# Patient Record
Sex: Male | Born: 1935
Health system: Southern US, Community
[De-identification: ages and names within clinical notes are randomized; demographics above are authoritative.]

## PROBLEM LIST (undated history)

## (undated) DIAGNOSIS — C801 Malignant (primary) neoplasm, unspecified: Secondary | ICD-10-CM

## (undated) DIAGNOSIS — IMO0002 Reserved for concepts with insufficient information to code with codable children: Secondary | ICD-10-CM

## (undated) DIAGNOSIS — R351 Nocturia: Secondary | ICD-10-CM

## (undated) DIAGNOSIS — B958 Unspecified staphylococcus as the cause of diseases classified elsewhere: Secondary | ICD-10-CM

## (undated) DIAGNOSIS — C679 Malignant neoplasm of bladder, unspecified: Secondary | ICD-10-CM

## (undated) DIAGNOSIS — N400 Enlarged prostate without lower urinary tract symptoms: Secondary | ICD-10-CM

## (undated) DIAGNOSIS — M545 Low back pain, unspecified: Secondary | ICD-10-CM

## (undated) DIAGNOSIS — D649 Anemia, unspecified: Secondary | ICD-10-CM

## (undated) DIAGNOSIS — I779 Disorder of arteries and arterioles, unspecified: Secondary | ICD-10-CM

## (undated) DIAGNOSIS — R9430 Abnormal result of cardiovascular function study, unspecified: Secondary | ICD-10-CM

## (undated) DIAGNOSIS — Z9889 Other specified postprocedural states: Secondary | ICD-10-CM

## (undated) DIAGNOSIS — Z85828 Personal history of other malignant neoplasm of skin: Secondary | ICD-10-CM

## (undated) DIAGNOSIS — H6980 Other specified disorders of Eustachian tube, unspecified ear: Secondary | ICD-10-CM

## (undated) DIAGNOSIS — I10 Essential (primary) hypertension: Secondary | ICD-10-CM

## (undated) DIAGNOSIS — E785 Hyperlipidemia, unspecified: Secondary | ICD-10-CM

## (undated) DIAGNOSIS — K219 Gastro-esophageal reflux disease without esophagitis: Secondary | ICD-10-CM

## (undated) DIAGNOSIS — J45909 Unspecified asthma, uncomplicated: Secondary | ICD-10-CM

## (undated) DIAGNOSIS — N529 Male erectile dysfunction, unspecified: Secondary | ICD-10-CM

## (undated) DIAGNOSIS — J449 Chronic obstructive pulmonary disease, unspecified: Secondary | ICD-10-CM

## (undated) DIAGNOSIS — I739 Peripheral vascular disease, unspecified: Secondary | ICD-10-CM

## (undated) DIAGNOSIS — C4490 Unspecified malignant neoplasm of skin, unspecified: Secondary | ICD-10-CM

## (undated) DIAGNOSIS — D696 Thrombocytopenia, unspecified: Secondary | ICD-10-CM

## (undated) DIAGNOSIS — Z79899 Other long term (current) drug therapy: Secondary | ICD-10-CM

## (undated) DIAGNOSIS — T7840XA Allergy, unspecified, initial encounter: Secondary | ICD-10-CM

## (undated) DIAGNOSIS — R911 Solitary pulmonary nodule: Secondary | ICD-10-CM

## (undated) DIAGNOSIS — M1990 Unspecified osteoarthritis, unspecified site: Secondary | ICD-10-CM

## (undated) DIAGNOSIS — E7800 Pure hypercholesterolemia, unspecified: Secondary | ICD-10-CM

## (undated) DIAGNOSIS — I219 Acute myocardial infarction, unspecified: Secondary | ICD-10-CM

## (undated) DIAGNOSIS — N62 Hypertrophy of breast: Secondary | ICD-10-CM

## (undated) DIAGNOSIS — I2510 Atherosclerotic heart disease of native coronary artery without angina pectoris: Secondary | ICD-10-CM

## (undated) DIAGNOSIS — I429 Cardiomyopathy, unspecified: Secondary | ICD-10-CM

## (undated) DIAGNOSIS — N183 Chronic kidney disease, stage 3 unspecified: Secondary | ICD-10-CM

## (undated) DIAGNOSIS — H6990 Unspecified Eustachian tube disorder, unspecified ear: Secondary | ICD-10-CM

## (undated) DIAGNOSIS — C649 Malignant neoplasm of unspecified kidney, except renal pelvis: Secondary | ICD-10-CM

## (undated) HISTORY — DX: Unspecified eustachian tube disorder, unspecified ear: H69.90

## (undated) HISTORY — PX: CARDIAC CATHETERIZATION: SHX172

## (undated) HISTORY — DX: Disorder of arteries and arterioles, unspecified: I77.9

## (undated) HISTORY — DX: Male erectile dysfunction, unspecified: N52.9

## (undated) HISTORY — DX: Nocturia: R35.1

## (undated) HISTORY — DX: Abnormal result of cardiovascular function study, unspecified: R94.30

## (undated) HISTORY — DX: Malignant (primary) neoplasm, unspecified: C80.1

## (undated) HISTORY — DX: Hyperlipidemia, unspecified: E78.5

## (undated) HISTORY — DX: Unspecified osteoarthritis, unspecified site: M19.90

## (undated) HISTORY — DX: Chronic obstructive pulmonary disease, unspecified: J44.9

## (undated) HISTORY — DX: Anemia, unspecified: D64.9

## (undated) HISTORY — DX: Unspecified asthma, uncomplicated: J45.909

## (undated) HISTORY — DX: Low back pain, unspecified: M54.50

## (undated) HISTORY — DX: Benign prostatic hyperplasia without lower urinary tract symptoms: N40.0

## (undated) HISTORY — DX: Other specified postprocedural states: Z98.890

## (undated) HISTORY — DX: Solitary pulmonary nodule: R91.1

## (undated) HISTORY — DX: Malignant neoplasm of bladder, unspecified: C67.9

## (undated) HISTORY — PX: OTHER SURGICAL HISTORY: SHX169

## (undated) HISTORY — DX: Reserved for concepts with insufficient information to code with codable children: IMO0002

## (undated) HISTORY — DX: Essential (primary) hypertension: I10

## (undated) HISTORY — DX: Thrombocytopenia, unspecified: D69.6

## (undated) HISTORY — DX: Atherosclerotic heart disease of native coronary artery without angina pectoris: I25.10

## (undated) HISTORY — DX: Unspecified malignant neoplasm of skin, unspecified: C44.90

## (undated) HISTORY — DX: Acute myocardial infarction, unspecified: I21.9

## (undated) HISTORY — DX: Hypertrophy of breast: N62

## (undated) HISTORY — DX: Other specified disorders of Eustachian tube, unspecified ear: H69.80

## (undated) HISTORY — DX: Allergy, unspecified, initial encounter: T78.40XA

## (undated) HISTORY — DX: Peripheral vascular disease, unspecified: I73.9

## (undated) HISTORY — DX: Malignant neoplasm of unspecified kidney, except renal pelvis: C64.9

## (undated) HISTORY — DX: Other long term (current) drug therapy: Z79.899

## (undated) HISTORY — DX: Gastro-esophageal reflux disease without esophagitis: K21.9

## (undated) HISTORY — DX: Pure hypercholesterolemia, unspecified: E78.00

## (undated) HISTORY — DX: Low back pain: M54.5

## (undated) HISTORY — DX: Personal history of other malignant neoplasm of skin: Z85.828

---

## 1996-02-24 HISTORY — PX: BACK SURGERY: SHX140

## 1997-04-25 DIAGNOSIS — B958 Unspecified staphylococcus as the cause of diseases classified elsewhere: Secondary | ICD-10-CM

## 1997-04-25 HISTORY — DX: Unspecified staphylococcus as the cause of diseases classified elsewhere: B95.8

## 2001-02-01 ENCOUNTER — Ambulatory Visit (HOSPITAL_COMMUNITY): Admission: RE | Admit: 2001-02-01 | Discharge: 2001-02-01 | Payer: Self-pay | Admitting: Internal Medicine

## 2001-02-01 ENCOUNTER — Encounter: Payer: Self-pay | Admitting: Internal Medicine

## 2001-02-21 ENCOUNTER — Encounter: Payer: Self-pay | Admitting: Urology

## 2001-02-21 ENCOUNTER — Encounter (INDEPENDENT_AMBULATORY_CARE_PROVIDER_SITE_OTHER): Payer: Self-pay | Admitting: Specialist

## 2001-02-21 ENCOUNTER — Ambulatory Visit (HOSPITAL_COMMUNITY): Admission: RE | Admit: 2001-02-21 | Discharge: 2001-02-21 | Payer: Self-pay | Admitting: Urology

## 2001-02-23 HISTORY — PX: NEPHRECTOMY: SHX65

## 2001-02-28 ENCOUNTER — Inpatient Hospital Stay (HOSPITAL_COMMUNITY): Admission: RE | Admit: 2001-02-28 | Discharge: 2001-03-06 | Payer: Self-pay | Admitting: Urology

## 2001-02-28 ENCOUNTER — Encounter (INDEPENDENT_AMBULATORY_CARE_PROVIDER_SITE_OTHER): Payer: Self-pay | Admitting: Specialist

## 2001-02-28 DIAGNOSIS — Z859 Personal history of malignant neoplasm, unspecified: Secondary | ICD-10-CM | POA: Insufficient documentation

## 2001-02-28 DIAGNOSIS — C801 Malignant (primary) neoplasm, unspecified: Secondary | ICD-10-CM

## 2001-02-28 HISTORY — DX: Malignant (primary) neoplasm, unspecified: C80.1

## 2001-09-03 ENCOUNTER — Encounter: Admission: RE | Admit: 2001-09-03 | Discharge: 2001-09-03 | Payer: Self-pay | Admitting: Urology

## 2001-09-03 ENCOUNTER — Encounter: Payer: Self-pay | Admitting: Urology

## 2001-12-26 HISTORY — PX: URETERECTOMY: SUR1402

## 2002-02-26 ENCOUNTER — Encounter: Admission: RE | Admit: 2002-02-26 | Discharge: 2002-02-26 | Payer: Self-pay | Admitting: Urology

## 2002-02-26 ENCOUNTER — Encounter: Payer: Self-pay | Admitting: Urology

## 2002-04-25 HISTORY — PX: CORNEAL TRANSPLANT: SHX108

## 2002-09-06 ENCOUNTER — Encounter: Payer: Self-pay | Admitting: Urology

## 2002-09-06 ENCOUNTER — Encounter: Admission: RE | Admit: 2002-09-06 | Discharge: 2002-09-06 | Payer: Self-pay | Admitting: Urology

## 2002-10-02 ENCOUNTER — Ambulatory Visit (HOSPITAL_BASED_OUTPATIENT_CLINIC_OR_DEPARTMENT_OTHER): Admission: RE | Admit: 2002-10-02 | Discharge: 2002-10-02 | Payer: Self-pay | Admitting: Urology

## 2002-10-02 ENCOUNTER — Encounter (INDEPENDENT_AMBULATORY_CARE_PROVIDER_SITE_OTHER): Payer: Self-pay | Admitting: Specialist

## 2003-01-30 ENCOUNTER — Emergency Department (HOSPITAL_COMMUNITY): Admission: EM | Admit: 2003-01-30 | Discharge: 2003-01-30 | Payer: Self-pay

## 2003-04-30 ENCOUNTER — Encounter (INDEPENDENT_AMBULATORY_CARE_PROVIDER_SITE_OTHER): Payer: Self-pay | Admitting: Specialist

## 2003-04-30 ENCOUNTER — Ambulatory Visit (HOSPITAL_BASED_OUTPATIENT_CLINIC_OR_DEPARTMENT_OTHER): Admission: RE | Admit: 2003-04-30 | Discharge: 2003-04-30 | Payer: Self-pay | Admitting: Urology

## 2003-07-27 HISTORY — PX: CHOLECYSTECTOMY: SHX55

## 2003-08-18 ENCOUNTER — Emergency Department (HOSPITAL_COMMUNITY): Admission: EM | Admit: 2003-08-18 | Discharge: 2003-08-18 | Payer: Self-pay | Admitting: Emergency Medicine

## 2003-08-18 ENCOUNTER — Encounter: Payer: Self-pay | Admitting: Emergency Medicine

## 2003-08-20 ENCOUNTER — Encounter: Payer: Self-pay | Admitting: Surgery

## 2003-08-20 ENCOUNTER — Encounter (INDEPENDENT_AMBULATORY_CARE_PROVIDER_SITE_OTHER): Payer: Self-pay | Admitting: Specialist

## 2003-08-21 ENCOUNTER — Encounter: Payer: Self-pay | Admitting: Surgery

## 2003-08-21 ENCOUNTER — Inpatient Hospital Stay (HOSPITAL_COMMUNITY): Admission: RE | Admit: 2003-08-21 | Discharge: 2003-08-23 | Payer: Self-pay | Admitting: Surgery

## 2003-09-08 ENCOUNTER — Encounter: Admission: RE | Admit: 2003-09-08 | Discharge: 2003-09-08 | Payer: Self-pay | Admitting: Infectious Diseases

## 2003-09-30 ENCOUNTER — Encounter: Admission: RE | Admit: 2003-09-30 | Discharge: 2003-09-30 | Payer: Self-pay | Admitting: Infectious Diseases

## 2003-10-13 ENCOUNTER — Encounter: Admission: RE | Admit: 2003-10-13 | Discharge: 2003-10-13 | Payer: Self-pay | Admitting: Infectious Diseases

## 2003-12-27 DIAGNOSIS — I2510 Atherosclerotic heart disease of native coronary artery without angina pectoris: Secondary | ICD-10-CM

## 2003-12-27 HISTORY — DX: Atherosclerotic heart disease of native coronary artery without angina pectoris: I25.10

## 2004-01-02 ENCOUNTER — Inpatient Hospital Stay (HOSPITAL_COMMUNITY): Admission: EM | Admit: 2004-01-02 | Discharge: 2004-01-06 | Payer: Self-pay

## 2004-01-19 ENCOUNTER — Encounter (HOSPITAL_COMMUNITY): Admission: RE | Admit: 2004-01-19 | Discharge: 2004-04-18 | Payer: Self-pay | Admitting: Internal Medicine

## 2004-12-02 ENCOUNTER — Ambulatory Visit: Payer: Self-pay | Admitting: Cardiology

## 2004-12-03 ENCOUNTER — Ambulatory Visit: Payer: Self-pay | Admitting: Internal Medicine

## 2005-04-29 ENCOUNTER — Ambulatory Visit: Payer: Self-pay | Admitting: Cardiology

## 2005-11-10 ENCOUNTER — Ambulatory Visit: Payer: Self-pay | Admitting: Internal Medicine

## 2006-03-16 ENCOUNTER — Ambulatory Visit: Payer: Self-pay | Admitting: Cardiology

## 2006-05-04 ENCOUNTER — Ambulatory Visit: Payer: Self-pay | Admitting: Cardiology

## 2006-05-15 ENCOUNTER — Ambulatory Visit: Payer: Self-pay

## 2006-05-18 ENCOUNTER — Ambulatory Visit: Payer: Self-pay | Admitting: Cardiology

## 2006-07-26 ENCOUNTER — Ambulatory Visit (HOSPITAL_BASED_OUTPATIENT_CLINIC_OR_DEPARTMENT_OTHER): Admission: RE | Admit: 2006-07-26 | Discharge: 2006-07-26 | Payer: Self-pay | Admitting: Urology

## 2006-07-26 ENCOUNTER — Encounter (INDEPENDENT_AMBULATORY_CARE_PROVIDER_SITE_OTHER): Payer: Self-pay | Admitting: Specialist

## 2006-08-22 ENCOUNTER — Ambulatory Visit: Payer: Self-pay | Admitting: Cardiology

## 2006-11-22 ENCOUNTER — Ambulatory Visit: Payer: Self-pay | Admitting: Internal Medicine

## 2007-01-14 ENCOUNTER — Ambulatory Visit: Payer: Self-pay | Admitting: Cardiology

## 2007-01-14 ENCOUNTER — Inpatient Hospital Stay (HOSPITAL_COMMUNITY): Admission: EM | Admit: 2007-01-14 | Discharge: 2007-01-16 | Payer: Self-pay | Admitting: Emergency Medicine

## 2007-01-22 ENCOUNTER — Ambulatory Visit: Payer: Self-pay

## 2007-01-25 DIAGNOSIS — E785 Hyperlipidemia, unspecified: Secondary | ICD-10-CM

## 2007-01-25 DIAGNOSIS — E1169 Type 2 diabetes mellitus with other specified complication: Secondary | ICD-10-CM | POA: Insufficient documentation

## 2007-02-05 ENCOUNTER — Ambulatory Visit: Payer: Self-pay | Admitting: Cardiology

## 2007-02-05 LAB — CONVERTED CEMR LAB
BUN: 17 mg/dL (ref 6–23)
Basophils Absolute: 0 10*3/uL (ref 0.0–0.1)
Creatinine, Ser: 1.5 mg/dL (ref 0.4–1.5)
Eosinophils Absolute: 0.2 10*3/uL (ref 0.0–0.6)
GFR calc Af Amer: 58 mL/min
GFR calc non Af Amer: 48 mL/min
HCT: 38.8 % — ABNORMAL LOW (ref 39.0–52.0)
Hemoglobin: 13.6 g/dL (ref 13.0–17.0)
MCHC: 35.1 g/dL (ref 30.0–36.0)
MCV: 92.1 fL (ref 78.0–100.0)
Monocytes Absolute: 0.5 10*3/uL (ref 0.2–0.7)
Monocytes Relative: 11.5 % — ABNORMAL HIGH (ref 3.0–11.0)
Neutrophils Relative %: 53 % (ref 43.0–77.0)
Potassium: 4.3 meq/L (ref 3.5–5.1)
RDW: 12.5 % (ref 11.5–14.6)
Sodium: 138 meq/L (ref 135–145)

## 2007-08-07 ENCOUNTER — Ambulatory Visit: Payer: Self-pay | Admitting: Cardiology

## 2007-08-10 ENCOUNTER — Ambulatory Visit: Payer: Self-pay | Admitting: Cardiology

## 2007-08-10 LAB — CONVERTED CEMR LAB
ALT: 35 units/L (ref 0–53)
AST: 31 units/L (ref 0–37)
Bilirubin, Direct: 0.1 mg/dL (ref 0.0–0.3)
Cholesterol: 152 mg/dL (ref 0–200)
Direct LDL: 51.9 mg/dL
HDL: 31.1 mg/dL — ABNORMAL LOW (ref >39.0)
Total Bilirubin: 1.8 mg/dL — ABNORMAL HIGH (ref 0.3–1.2)
Total Protein: 6.7 g/dL (ref 6.0–8.3)

## 2007-10-23 ENCOUNTER — Ambulatory Visit: Payer: Self-pay | Admitting: Cardiology

## 2007-10-23 ENCOUNTER — Ambulatory Visit: Payer: Self-pay | Admitting: Internal Medicine

## 2007-10-23 LAB — CONVERTED CEMR LAB
ALT: 39 units/L (ref 0–53)
AST: 28 units/L (ref 0–37)
Albumin: 4.1 g/dL (ref 3.5–5.2)
Alkaline Phosphatase: 69 units/L (ref 39–117)
Total Bilirubin: 1.6 mg/dL — ABNORMAL HIGH (ref 0.3–1.2)
Total Protein: 6.5 g/dL (ref 6.0–8.3)
Triglycerides: 404 mg/dL (ref 0–149)

## 2008-01-04 ENCOUNTER — Ambulatory Visit: Payer: Self-pay | Admitting: Cardiovascular Disease

## 2008-01-04 ENCOUNTER — Inpatient Hospital Stay (HOSPITAL_COMMUNITY): Admission: EM | Admit: 2008-01-04 | Discharge: 2008-01-08 | Payer: Self-pay | Admitting: Emergency Medicine

## 2008-01-25 ENCOUNTER — Ambulatory Visit: Payer: Self-pay | Admitting: Cardiology

## 2008-02-04 ENCOUNTER — Ambulatory Visit: Payer: Self-pay | Admitting: Internal Medicine

## 2008-03-17 ENCOUNTER — Telehealth (INDEPENDENT_AMBULATORY_CARE_PROVIDER_SITE_OTHER): Payer: Self-pay | Admitting: *Deleted

## 2008-03-18 ENCOUNTER — Emergency Department (HOSPITAL_COMMUNITY): Admission: EM | Admit: 2008-03-18 | Discharge: 2008-03-18 | Payer: Self-pay | Admitting: Emergency Medicine

## 2008-03-24 ENCOUNTER — Ambulatory Visit: Payer: Self-pay | Admitting: Cardiology

## 2008-03-28 ENCOUNTER — Ambulatory Visit: Payer: Self-pay | Admitting: Internal Medicine

## 2008-03-28 DIAGNOSIS — J45909 Unspecified asthma, uncomplicated: Secondary | ICD-10-CM | POA: Insufficient documentation

## 2008-04-17 ENCOUNTER — Telehealth: Payer: Self-pay | Admitting: Internal Medicine

## 2008-04-23 ENCOUNTER — Ambulatory Visit: Payer: Self-pay | Admitting: Internal Medicine

## 2008-04-23 DIAGNOSIS — H6980 Other specified disorders of Eustachian tube, unspecified ear: Secondary | ICD-10-CM

## 2008-04-23 DIAGNOSIS — H6990 Unspecified Eustachian tube disorder, unspecified ear: Secondary | ICD-10-CM | POA: Insufficient documentation

## 2008-04-23 DIAGNOSIS — I10 Essential (primary) hypertension: Secondary | ICD-10-CM

## 2008-04-23 DIAGNOSIS — E1159 Type 2 diabetes mellitus with other circulatory complications: Secondary | ICD-10-CM | POA: Insufficient documentation

## 2008-06-06 ENCOUNTER — Ambulatory Visit: Payer: Self-pay | Admitting: Cardiology

## 2008-06-06 LAB — CONVERTED CEMR LAB
Albumin: 4 g/dL (ref 3.5–5.2)
Alkaline Phosphatase: 65 units/L (ref 39–117)
Cholesterol: 144 mg/dL (ref 0–200)
Total Bilirubin: 1.5 mg/dL — ABNORMAL HIGH (ref 0.3–1.2)
Total CHOL/HDL Ratio: 5
VLDL: 81 mg/dL — ABNORMAL HIGH (ref 0–40)

## 2008-09-03 ENCOUNTER — Ambulatory Visit: Payer: Self-pay | Admitting: Cardiology

## 2008-09-17 ENCOUNTER — Ambulatory Visit: Payer: Self-pay | Admitting: Internal Medicine

## 2008-10-02 ENCOUNTER — Ambulatory Visit: Payer: Self-pay | Admitting: Family Medicine

## 2008-10-04 ENCOUNTER — Encounter: Payer: Self-pay | Admitting: Family Medicine

## 2008-10-26 HISTORY — PX: CORNEAL TRANSPLANT: SHX108

## 2008-10-27 ENCOUNTER — Encounter: Payer: Self-pay | Admitting: Internal Medicine

## 2008-12-02 ENCOUNTER — Encounter: Payer: Self-pay | Admitting: Internal Medicine

## 2009-01-16 ENCOUNTER — Ambulatory Visit: Payer: Self-pay | Admitting: Cardiology

## 2009-01-16 LAB — CONVERTED CEMR LAB
AST: 26 units/L (ref 0–37)
Albumin: 3.8 g/dL (ref 3.5–5.2)
Alkaline Phosphatase: 62 units/L (ref 39–117)
Bilirubin, Direct: 0.1 mg/dL (ref 0.0–0.3)
Total CHOL/HDL Ratio: 4.5
Triglycerides: 291 mg/dL (ref 0–149)

## 2009-02-18 ENCOUNTER — Encounter (INDEPENDENT_AMBULATORY_CARE_PROVIDER_SITE_OTHER): Payer: Self-pay | Admitting: Urology

## 2009-02-18 ENCOUNTER — Ambulatory Visit (HOSPITAL_BASED_OUTPATIENT_CLINIC_OR_DEPARTMENT_OTHER): Admission: RE | Admit: 2009-02-18 | Discharge: 2009-02-18 | Payer: Self-pay | Admitting: Urology

## 2009-06-22 ENCOUNTER — Encounter: Payer: Self-pay | Admitting: Cardiology

## 2009-07-03 ENCOUNTER — Ambulatory Visit: Payer: Self-pay | Admitting: Cardiology

## 2009-07-07 ENCOUNTER — Ambulatory Visit: Payer: Self-pay | Admitting: Internal Medicine

## 2009-07-07 DIAGNOSIS — M545 Low back pain: Secondary | ICD-10-CM | POA: Insufficient documentation

## 2009-07-07 LAB — CONVERTED CEMR LAB
Bilirubin Urine: NEGATIVE
Blood in Urine, dipstick: NEGATIVE
Glucose, Urine, Semiquant: NEGATIVE
Ketones, urine, test strip: NEGATIVE
Protein, U semiquant: NEGATIVE
Specific Gravity, Urine: <1.005
pH: 7

## 2009-07-18 DIAGNOSIS — K219 Gastro-esophageal reflux disease without esophagitis: Secondary | ICD-10-CM | POA: Insufficient documentation

## 2009-07-18 DIAGNOSIS — J4489 Other specified chronic obstructive pulmonary disease: Secondary | ICD-10-CM | POA: Insufficient documentation

## 2009-07-18 DIAGNOSIS — C649 Malignant neoplasm of unspecified kidney, except renal pelvis: Secondary | ICD-10-CM

## 2009-07-18 DIAGNOSIS — J449 Chronic obstructive pulmonary disease, unspecified: Secondary | ICD-10-CM

## 2009-07-18 DIAGNOSIS — D649 Anemia, unspecified: Secondary | ICD-10-CM | POA: Insufficient documentation

## 2009-07-18 DIAGNOSIS — Z85528 Personal history of other malignant neoplasm of kidney: Secondary | ICD-10-CM | POA: Insufficient documentation

## 2009-07-21 LAB — CONVERTED CEMR LAB
ALT: 38 units/L (ref 0–53)
Alkaline Phosphatase: 60 units/L (ref 39–117)
Bilirubin, Direct: 0.1 mg/dL (ref 0.0–0.3)
Direct LDL: 65.6 mg/dL
HDL: 38.2 mg/dL — ABNORMAL LOW (ref >39.00)
Total Bilirubin: 1.8 mg/dL — ABNORMAL HIGH (ref 0.3–1.2)
Total CHOL/HDL Ratio: 6
VLDL: 133.2 mg/dL — ABNORMAL HIGH (ref 0.0–40.0)

## 2009-09-08 ENCOUNTER — Encounter: Payer: Self-pay | Admitting: Cardiology

## 2009-09-09 ENCOUNTER — Ambulatory Visit: Payer: Self-pay | Admitting: Cardiology

## 2009-09-22 ENCOUNTER — Encounter: Payer: Self-pay | Admitting: Cardiology

## 2009-09-22 ENCOUNTER — Ambulatory Visit: Payer: Self-pay

## 2009-10-09 ENCOUNTER — Emergency Department (HOSPITAL_COMMUNITY): Admission: EM | Admit: 2009-10-09 | Discharge: 2009-10-09 | Payer: Self-pay | Admitting: Emergency Medicine

## 2009-11-04 ENCOUNTER — Emergency Department (HOSPITAL_COMMUNITY): Admission: EM | Admit: 2009-11-04 | Discharge: 2009-11-04 | Payer: Self-pay | Admitting: Emergency Medicine

## 2009-12-07 ENCOUNTER — Ambulatory Visit: Payer: Self-pay | Admitting: Internal Medicine

## 2009-12-07 DIAGNOSIS — M5412 Radiculopathy, cervical region: Secondary | ICD-10-CM | POA: Insufficient documentation

## 2009-12-07 DIAGNOSIS — Z85828 Personal history of other malignant neoplasm of skin: Secondary | ICD-10-CM | POA: Insufficient documentation

## 2009-12-10 LAB — CONVERTED CEMR LAB
ALT: 25 units/L (ref 0–53)
AST: 25 units/L (ref 0–37)
BUN: 20 mg/dL (ref 6–23)
Bilirubin, Direct: 0.2 mg/dL (ref 0.0–0.3)
CO2: 32 meq/L (ref 19–32)
Calcium: 9.4 mg/dL (ref 8.4–10.5)
Cholesterol: 103 mg/dL (ref 0–200)
GFR calc non Af Amer: 51.51 mL/min (ref >60)
Glucose, Bld: 107 mg/dL — ABNORMAL HIGH (ref 70–99)
HDL: 36 mg/dL — ABNORMAL LOW (ref >39.00)
Hemoglobin: 14.8 g/dL (ref 13.0–17.0)
Lymphocytes Relative: 26.6 % (ref 12.0–46.0)
MCHC: 32.6 g/dL (ref 30.0–36.0)
Monocytes Relative: 9.5 % (ref 3.0–12.0)
Platelets: 118 10*3/uL — ABNORMAL LOW (ref 150.0–400.0)
RDW: 12.2 % (ref 11.5–14.6)
TSH: 1.44 microintl units/mL (ref 0.35–5.50)
Total Bilirubin: 1.9 mg/dL — ABNORMAL HIGH (ref 0.3–1.2)
Total Protein: 7.3 g/dL (ref 6.0–8.3)
Triglycerides: 142 mg/dL (ref 0.0–149.0)
VLDL: 28.4 mg/dL (ref 0.0–40.0)

## 2009-12-11 ENCOUNTER — Encounter (INDEPENDENT_AMBULATORY_CARE_PROVIDER_SITE_OTHER): Payer: Self-pay | Admitting: *Deleted

## 2010-11-17 ENCOUNTER — Ambulatory Visit: Payer: Self-pay | Admitting: Internal Medicine

## 2010-11-17 ENCOUNTER — Encounter: Payer: Self-pay | Admitting: Internal Medicine

## 2010-11-17 DIAGNOSIS — K921 Melena: Secondary | ICD-10-CM

## 2010-11-17 DIAGNOSIS — R109 Unspecified abdominal pain: Secondary | ICD-10-CM | POA: Insufficient documentation

## 2010-11-17 DIAGNOSIS — R079 Chest pain, unspecified: Secondary | ICD-10-CM | POA: Insufficient documentation

## 2010-11-22 ENCOUNTER — Ambulatory Visit: Payer: Self-pay | Admitting: Internal Medicine

## 2010-11-22 LAB — CONVERTED CEMR LAB
Hemoglobin: 14.2 g/dL (ref 13.0–17.0)
OCCULT 2: NEGATIVE
OCCULT 3: NEGATIVE

## 2010-11-23 ENCOUNTER — Encounter (INDEPENDENT_AMBULATORY_CARE_PROVIDER_SITE_OTHER): Payer: Self-pay | Admitting: *Deleted

## 2011-01-16 ENCOUNTER — Encounter: Payer: Self-pay | Admitting: Urology

## 2011-01-27 NOTE — Letter (Signed)
Summary: Results Follow up Letter  [facility]   [place], [place] [address]   Phone: [phone]  Fax: [phone]    11/23/2010 MRN: [id number]  [facility] [address] [place],   [address]  Dear Mr. SCHWIETERMAN,  The following are the results of your recent test(s):  Test         Result    Pap Smear:        Normal _____  Not Normal _____ Comments: ______________________________________________________ Cholesterol: LDL(Bad cholesterol):         Your goal is less than:         HDL (Good cholesterol):       Your goal is more than: Comments:  ______________________________________________________ Mammogram:        Normal _____  Not Normal _____ Comments:  ___________________________________________________________________ Hemoccult:        Normal _X____  Not normal _______ Comments:    _____________________________________________________________________ Other Tests:    We routinely do not discuss normal results over the telephone.  If you desire a copy of the results, or you have any questions about this information we can discuss them at your next office visit.   Sincerely,

## 2011-01-27 NOTE — Assessment & Plan Note (Signed)
Summary: black,tarry stools//lch   Vital Signs:  Patient profile:   77 year old male Weight:      155.6 pounds BMI:     24.46 Temp:     98.2 degrees F oral Pulse rate:   64 / minute Resp:     18 per minute BP sitting:   118 / 76  (left arm) Cuff size:   large  Vitals Entered By: Georgette Dover CMA (November 17, 2010 3:45 PM) CC: Black stools x 2 days, chest soreness (not pain " Patient states") x 3 days and right side pain, Heartburn   Primary Care Provider:  Unice Cobble MD  CC:  Black stools x 2 days, chest soreness (not pain " Patient states") x 3 days and right side pain, and Heartburn.  History of Present Illness:      This is an 77 year old man who presents with   "black stool"  X 2 days.  The patient reports epigastric and chest pain for 3 days, but denies acid reflux, sour taste in mouth, trouble swallowing, and weight loss.  The patient reports the following alarm features of dyspepsia: melena.  The patient denies the following alarm features: dysphagia, hematemesis, and vomiting.  Symptoms are  not worse with  any specific trgger. No diagnostic studies to date. No PMH of ulcer or colonoscopy ( "I never felt the need for it").  The patient has found the following treatments to be effective: an antacid.  He denies exertional chest pain.  Current Medications (verified): 1)  Plavix 75 Mg  Tabs (Clopidogrel Bisulfate) .Marland Kitchen.. 1 By Mouth Qd 2)  Aspirin 81 Mg Tbec (Aspirin) .... Take One Tablet By Mouth Daily 3)  Nexium 40 Mg  Cpdr (Esomeprazole Magnesium) .Marland Kitchen.. 1 By Mouth Qd 4)  Lipitor 20 Mg  Tabs (Atorvastatin Calcium) .Marland Kitchen.. 1 By Mouth Qd 5)  Avodart 0.5 Mg Caps (Dutasteride) .Marland Kitchen.. 1 By Mouth Once Daily 6)  Multivitamins   Tabs (Multiple Vitamin) .... Take One Tablet By Mouth Once Daily. 7)  Diovan 160 Mg  Tabs (Valsartan) .Marland Kitchen.. 1 By Mouth Once Daily  Allergies: 1)  ! * Mavik  Review of Systems CV:  Complains of shortness of breath with exertion; denies leg cramps with exertion;  No exertional chest pain. DOE is stable. Resp:  Denies chest pain with inspiration, coughing up blood, shortness of breath, and sputum productive.  Physical Exam  General:  well-nourished,in no acute distress; alert,appropriate and cooperative throughout examination Eyes:  No corneal or conjunctival inflammation noted.No icterus  Mouth:  Oral mucosa and oropharynx without lesions or exudates.  Teeth in good repair. No pharyngeal erythema.   Lungs:  Normal respiratory effort, chest expands symmetrically. Lungs are clear to auscultation, no crackles or wheezes. Heart:  regular rhythm, no murmur, no gallop, no rub, no JVD, no HJR, and bradycardia.   Abdomen:  Bowel sounds positive,abdomen soft and non-tender without masses, organomegaly or hernias noted. Rectal:  No external abnormalities noted. Normal sphincter tone. No rectal masses or tenderness. Stool dark but FOB - Pulses:  R and L carotid,radial,dorsalis pedis and posterior tibial pulses are full and equal bilaterally Extremities:  No clubbing, cyanosis, edema. Neg Homan's Neurologic:  alert & oriented X3.   Skin:  Intact without suspicious lesions or rashes, no pallor Cervical Nodes:  No lymphadenopathy noted Axillary Nodes:  No palpable lymphadenopathy Psych:  memory intact for recent and remote, normally interactive, and subdued.     Impression & Recommendations:  Problem #  1:  MELENA (ICD-578.1) on Plavix Orders: Venipuncture [id number])  Problem # 2:  ABDOMINAL PAIN (ICD-789.00) Epigastric/ Substernal  Problem # 3:  CHEST PAIN (ICD-786.50)  Orders: EKG w/ Interpretation (93000): stable vs 08/2009  Problem # 4:  CAD (ICD-414.00)  His updated medication list for this problem includes:    Plavix 75 Mg Tabs (Clopidogrel bisulfate) .Marland Kitchen... 1 by mouth qd    Aspirin 81 Mg Tbec (Aspirin) .Marland Kitchen... Take one tablet by mouth daily    Diovan 160 Mg Tabs (Valsartan) .Marland Kitchen... 1 by mouth once daily  Orders: EKG w/ Interpretation  (93000)  Complete Medication List: 1)  Plavix 75 Mg Tabs (Clopidogrel bisulfate) .Marland Kitchen.. 1 by mouth qd 2)  Aspirin 81 Mg Tbec (Aspirin) .... Take one tablet by mouth daily 3)  Nexium 40 Mg Cpdr (Esomeprazole magnesium) .Marland Kitchen.. 1 by mouth qd 4)  Lipitor 20 Mg Tabs (Atorvastatin calcium) .Marland Kitchen.. 1 by mouth qd 5)  Avodart 0.5 Mg Caps (Dutasteride) .Marland Kitchen.. 1 by mouth once daily 6)  Multivitamins Tabs (Multiple vitamin) .... Take one tablet by mouth once daily. 7)  Diovan 160 Mg Tabs (Valsartan) .Marland Kitchen.. 1 by mouth once daily  Patient Instructions: 1)  Increase Nexium to two times a day pre meals ( samples ).  To ER with this document if symptoms persist or progress. Complete stool cards. 2)  Avoid foods high in acid (tomatoes, citrus juices, spicy foods). Avoid eating within two hours of lying down or before exercising. Do not over eat; try smaller more frequent meals. Elevate head of bed twelve inches when sleeping.   Orders Added: 1)  Est. Patient Level IV [id number] 2)  EKG w/ Interpretation [93000] 3)  Venipuncture [id number]

## 2011-03-30 LAB — URINALYSIS, ROUTINE W REFLEX MICROSCOPIC
Bilirubin Urine: NEGATIVE
Ketones, ur: NEGATIVE mg/dL
Nitrite: NEGATIVE
Specific Gravity, Urine: 1.005 (ref 1.005–1.030)
Urobilinogen, UA: 0.2 mg/dL (ref 0.0–1.0)
pH: 7.5 (ref 5.0–8.0)

## 2011-03-30 LAB — POCT CARDIAC MARKERS
CKMB, poc: 3.9 ng/mL (ref 1.0–8.0)
Troponin i, poc: <0.05 ng/mL (ref 0.00–0.09)

## 2011-03-30 LAB — TYPE AND SCREEN: ABO/RH(D): O POS

## 2011-03-30 LAB — COMPREHENSIVE METABOLIC PANEL
ALT: 35 U/L (ref 0–53)
BUN: 15 mg/dL (ref 6–23)
CO2: 24 mEq/L (ref 19–32)
Chloride: 102 mEq/L (ref 96–112)
Creatinine, Ser: 1.43 mg/dL (ref 0.4–1.5)
GFR calc Af Amer: 57 mL/min — ABNORMAL LOW (ref >60)
Potassium: 4.3 mEq/L (ref 3.5–5.1)
Total Protein: 6.4 g/dL (ref 6.0–8.3)

## 2011-03-30 LAB — POCT I-STAT, CHEM 8
Calcium, Ion: 1.2 mmol/L (ref 1.12–1.32)
Glucose, Bld: 94 mg/dL (ref 70–99)
HCT: 39 % (ref 39.0–52.0)
TCO2: 28 mmol/L (ref 0–100)

## 2011-03-30 LAB — ABO/RH: ABO/RH(D): O POS

## 2011-03-30 LAB — CBC
HCT: 37.4 % — ABNORMAL LOW (ref 39.0–52.0)
MCV: 92.3 fL (ref 78.0–100.0)
Platelets: 118 10*3/uL — ABNORMAL LOW (ref 150–400)
RBC: 4.06 MIL/uL — ABNORMAL LOW (ref 4.22–5.81)
WBC: 3.6 10*3/uL — ABNORMAL LOW (ref 4.0–10.5)

## 2011-03-30 LAB — PROTIME-INR
INR: 1.04 (ref 0.00–1.49)
Prothrombin Time: 13.5 seconds (ref 11.6–15.2)

## 2011-03-30 LAB — LACTIC ACID, PLASMA: Lactic Acid, Venous: 1.8 mmol/L (ref 0.5–2.2)

## 2011-03-30 LAB — APTT: aPTT: 30 seconds (ref 24–37)

## 2011-04-02 ENCOUNTER — Other Ambulatory Visit: Payer: Self-pay | Admitting: Cardiology

## 2011-04-12 LAB — URINALYSIS, ROUTINE W REFLEX MICROSCOPIC
Nitrite: NEGATIVE
Specific Gravity, Urine: 1.019 (ref 1.005–1.030)
Urobilinogen, UA: 0.2 mg/dL (ref 0.0–1.0)
pH: 6 (ref 5.0–8.0)

## 2011-04-12 LAB — BASIC METABOLIC PANEL
BUN: 25 mg/dL — ABNORMAL HIGH (ref 6–23)
Calcium: 9.2 mg/dL (ref 8.4–10.5)
GFR calc non Af Amer: 43 mL/min — ABNORMAL LOW (ref >60)
Glucose, Bld: 101 mg/dL — ABNORMAL HIGH (ref 70–99)
Potassium: 4.7 mEq/L (ref 3.5–5.1)
Sodium: 140 mEq/L (ref 135–145)

## 2011-04-12 LAB — CBC
HCT: 40.8 % (ref 39.0–52.0)
Platelets: 127 10*3/uL — ABNORMAL LOW (ref 150–400)
RDW: 12.9 % (ref 11.5–15.5)
WBC: 4.3 10*3/uL (ref 4.0–10.5)

## 2011-05-08 ENCOUNTER — Other Ambulatory Visit: Payer: Self-pay | Admitting: Cardiology

## 2011-05-10 NOTE — Assessment & Plan Note (Signed)
[facility]                            CARDIOLOGY OFFICE NOTE   Collin [name], Collin [name]                        MRN:          [id number]  DATE:09/03/2008                            DOB:          May 27, 1925    Collin [name] is 39 and he looks quite good.  He is remaining active.  He is  helping to care for his wife who has Alzheimer.  He is not having any  significant chest pain.  He noted on a few occasions that he might have  some dizziness at night.  He thought it might be related to Lipitor.  We  talked about this further and I think this is unlikely.  Also, he has  noted that his pressure he gets relatively low after he takes his  Diovan.  He decreased this dose to 80 mg and this is more stable and we  will keep him on 80 mg.   He has known coronary artery disease, but he has been stable.   PAST MEDICAL HISTORY:   ALLERGIES:  There was a question that he had a cough from [place].   MEDICATIONS:  Aspirin, Uroxatral, multivitamin, Lipitor, Nexium, Plavix  and Diovan 80.   OTHER MEDICAL PROBLEMS:  See the list on the note of February 05, 2007.   REVIEW OF SYSTEMS:  Other than the HPI, his review of systems is  negative.   PHYSICAL EXAMINATION:  VITAL SIGNS:  Weight is 162 pounds.  Blood  pressure 128/78 with a pulse of 56.  GENERAL:  The patient is oriented to person, time, and place.  Affect is  normal.  HEENT:  No xanthelasma.  He has normal extraocular motion.  NECK:  There are no carotid bruits.  There is no jugular venous  distention.  LUNGS:  Clear.  Respiratory effort is not labored.  CARDIAC:  An S1 with an S2.  There are no clicks or significant murmurs.  ABDOMEN:  Soft.  He has no peripheral edema.   No labs were done today.   Collin [name] is really doing quite well.  We have followed his lipids.  His  HDL is low, but his LDL is quite low.  I am not inclined to change his  medicines further.   Problems are listed on the note of February 05, 2007.  1. Coronary artery disease.  He had a Taxus stent with a non-ST-      elevation MI in 2005 and      followup cath in 2009 revealed moderate disease of the LAD, but no      marked progression.  His other medical problems are stable.  I will      see him back in 1 year for followup.     Carlena Bjornstad, MD, [facility]  Electronically Signed    JDK/MedQ  DD: 09/03/2008  DT: 09/04/2008  Job #: [id number]   cc:   Darrick Penna. Linna Darner, MD,FACP,FCCP

## 2011-05-10 NOTE — Assessment & Plan Note (Signed)
[facility]                            CARDIOLOGY OFFICE NOTE   CHUONG, VONBANK                        MRN:          [id number]  DATE:01/25/2008                            DOB:          08-19-1925    Collin [name] has known coronary disease.  Recently he was admitted to [facility].  He had had some chest pain.  Catheterization was done.  There was  nonobstructive disease.  He had moderate disease in the LAD and his  stent to his right coronary was patent.  There is no marked progression.  He is stabilized and he has been at home.  Since being at home more  recently he has had a cough.  It sounds like an upper respiratory type  of issue.  He is using some Mucinex and it has not cleared completely.  I encouraged him to see Dr. Linna Darner for early follow up if this does not  resolve.  I do not believe this is related to any of his medications.  The only new medicine is his nitrate.   PAST MEDICAL HISTORY:  Allergies:  No known drug allergies.   Medications:  Mavik, aspirin, Uroxatral, multivitamin, Lipitor, Nexium,  Imdur and Plavix.   Other medical problems:  See the list on my note of February 05, 2007.   REVIEW OF SYSTEMS:  Review of systems is negative today.   PHYSICAL EXAM:  Weight is 162.  Blood pressure is 104/62 with a pulse of  70.  The patient is oriented to person, time and place.  Affect is  normal.  HEENT:  Reveals no xanthelasma.  Normal extraocular motion.  There are  no carotid bruits.  There is no jugular venous distention.  ABDOMEN is soft.  There are no masses or bruits.  LUNGS are clear.  Respiratory effort is not labored.  CARDIAC exam reveals S1-S2.  There are no clicks or significant murmurs.   No labs were done today.   Problems are listed on my note of February 05, 2007.  Recently he had  follow-up cath as outlined and his cardiac status is stable.  No change  in his medications today.   He will be in touch with Dr. Linna Darner if  he has continued cough.     Carlena Bjornstad, MD, [facility]  Electronically Signed    JDK/MedQ  DD: 01/25/2008  DT: 01/25/2008  Job #: [id number]

## 2011-05-10 NOTE — Discharge Summary (Signed)
[name], Collin [name]                 ACCOUNT NO.:  [id number]   MEDICAL RECORD NO.:  [id number]          PATIENT TYPE:  INP   LOCATION:  3707                         FACILITY:  [facility]   PHYSICIAN:  Shaune Pascal. Bensimhon, MDDATE OF BIRTH:  1925/11/04   DATE OF ADMISSION:  01/04/2008  DATE OF DISCHARGE:  01/08/2008                               DISCHARGE SUMMARY   DISCHARGE DIAGNOSIS:  Chest pain status post cardiac catheterization  this admission show a nonobstructive CAD.  The patient with moderate  disease in LAD and with patent stent in RCA.  No marked progression  since prior study.   PAST MEDICAL HISTORY:  1. Includes coronary artery in 2005 with a Taxus drug-eluting stent to      the RCA.  Stress Myoview in May 2007 showed mild peri-infarct      ischemia.  2. Hypertension.  3. Dyslipidemia.  4. Renal carcinoma status post left nephrectomy.  5. Multiple bladder tumors status post transurethral resection on      02/26/2006.  6. GERD.  7. Normocytic anemia.  8. COPD by chest x-ray.   The patient presented to [facility] emergency room complaining of chest  discomfort with negative cardiac enzymes.  The patient agreeable to  cardiac catheterization for reevaluation of coronary anatomy.  The  patient hydrated over the weekend and taken to the cath lab on  01/07/2008.  January 07, 2008.  Results as stated above.  The patient  tolerated the procedure without complications.  Dr. Haroldine Laws in to see  the patient on day of discharge.  Lab work stable.  Hemoglobin 14.3.  BUN and creatinine 17 and 1.52.  Potassium 4.2.  Patient being  discharged home to follow up with Dr. Ron Parker on 01/25/2008 at 11:45.  The  patient has been given the post cardiac catheterization discharge  instructions.  Resume previous medicines include Mavik 1 mg daily,  Plavix 75, aspirin 81, isosorbide 30, Lipitor 20, Uroxatral 10, Nexium  40 and a multivitamin daily.   DURATION OF DISCHARGE ENCOUNTER:  Less than 30  minutes.      Rosanne Sack, ACNP      Shaune Pascal. Bensimhon, MD  Electronically Signed   MB/MEDQ  D:  01/08/2008  T:  01/08/2008  Job:  [id number]   cc:   Darrick Penna. Linna Darner, MD,FACP,FCCP

## 2011-05-10 NOTE — Cardiovascular Report (Signed)
Collin [name], Collin [name]                 ACCOUNT NO.:  [id number]   MEDICAL RECORD NO.:  [id number]          PATIENT TYPE:  INP   LOCATION:  3707                         FACILITY:  [facility]   PHYSICIAN:  Satira Sark, MD DATE OF BIRTH:  [birth year]   DATE OF PROCEDURE:  01/07/2008  DATE OF DISCHARGE:                            CARDIAC CATHETERIZATION   PRIMARY CARDIOLOGIST:  Dr. Dola Argyle.   REQUESTING CARDIOLOGIST:  Dr. Jenkins Rouge.   PRIMARY CARE PHYSICIAN:  Dr. Unice Cobble.   INDICATIONS:  Collin [name] is a pleasant 77 year old gentleman with a  history of coronary disease status post inferior wall ST elevation  myocardial infarction in 2005 treated with a drug-eluting stent to the  right coronary artery.  He also had residual disease including 70-80%  left anterior descending stenosis that has been managed medically.  Additional problems include hyperlipidemia, hypertension and previously  documented renal cell carcinoma status post left nephrectomy with mild  renal insufficiency.  He is admitted now to the hospital with recent  chest discomfort that reminded him of angina.  This has largely been  associated with exertion and he has ruled out for myocardial infarction  by serial cardiac markers.  He has been hydrated and has a creatinine of  1.46 and is referred now for a diagnostic cardiac catheterization to  assess stent patency and left coronary anatomy as well.  The potential  risks and benefits, including contrast nephropathy, were discussed with  the patient in advance, and informed consent was obtained.   PROCEDURES PERFORMED:  1. Left heart catheterization (no ventriculogram performed).  2. Selective coronary geography.   ACCESS AND EQUIPMENT:  The area about the right femoral artery was  anesthetized with 1% lidocaine, and a 6-French sheath was placed in the  right femoral artery via the modified Seldinger technique.  Standard  preformed 6-French JL-4 and 4-French  JR-4 catheters were used for  selective coronary geography.  There was some damping on engagement of  the right coronary artery which was not an issue with the 4-French  catheter.  A standard 6-French angled pigtail catheter was used for left  heart catheterization.  All exchanges were made over a wire.  A total of  75 mL Omnipaque were used.  The patient tolerated the procedure well  without any complications.   HEMODYNAMIC RESULTS:  Aorta 129/63 mmHg.  Left ventricle 129/9 mmHg.   ANGIOGRAPHIC FINDINGS:  1. The left main coronary artery gives rise to the left anterior      descending and circumflex vessels.  This vessel was relatively      small and likely diffusely diseased.  There does look to be 30-40%      distal stenosis.  2. Left anterior descending is a small to medium caliber vessel      essentially with one large diagonal branch.  There is calcification      in the proximal to midportion of left anterior descending with an      area of approximately 60% diffuse stenosis around the takeoff of      the diagonal branch.  This branch is diseased at approximately 50-      60% in its ostium, and there are other scattered moderate stenoses      within the sub-branch system of the diagonal branch.  In the distal      left anterior descending is an area of 50% stenosis.  3. There is a medium caliber circumflex vessel with two very small      proximal obtuse marginal branches followed by three larger obtuse      marginal branches.  There is an area of 40% diffuse stenosis within      the midportion of the circumflex.  Otherwise, mild luminal      irregularity is noted.  4. The right coronary artery is a medium caliber dominant vessel.  A      stent is visualized within the distal portion of the right coronary      artery, and this area is widely patent.  In the ostial right      coronary artery is a 50% stenosis followed by an area of 30%      stenosis proximally.  There is diffuse  30-40% stenosis in the      midportion of the vessel.   Left ventriculography was not performed.   DIAGNOSES:  1. Coronary artery disease as outlined including a patent stent site      within the distal right coronary artery and otherwise moderate      disease within the left anterior descending and ostial right      coronary artery.  Comparison with prior films from 2005      demonstrates no marked degree of progression although suspect there      has been some diffuse progressive atherosclerosis in comparison.      No obvious focal culprit stands out to require percutaneous      intervention at this time, however.  2. No significant aortic valve gradient with a left ventricular end-      diastolic pressure of 9 mmHg.   DISCUSSION:  Medical therapy is anticipated.  No clear percutaneous  intervention requirements at this time.  The patient will be hydrated  overnight with follow-up BMET in the morning.      Satira Sark, MD  Electronically Signed     SGM/MEDQ  D:  01/07/2008  T:  01/08/2008  Job:  [id number]   cc:   Carlena Bjornstad, MD, [facility]. Johnsie Cancel, MD, [facility]  Darrick Penna. Linna Darner, MD,FACP,FCCP

## 2011-05-10 NOTE — Op Note (Signed)
[name], Collin                 ACCOUNT NO.:  [id number]   MEDICAL RECORD NO.:  [id number]          PATIENT TYPE:  AMB   LOCATION:  [facility]                         FACILITY:  [facility]:  Corky Downs, M.D.DATE OF BIRTH:  19-May-1925   DATE OF PROCEDURE:  02/18/2009  DATE OF DISCHARGE:                               OPERATIVE REPORT   PREOPERATIVE DIAGNOSIS:  Recurrent bladder cancer, right posterior base.   POSTOPERATIVE DIAGNOSIS:  Recurrent bladder cancer, right posterior  base, pending path report.   PROCEDURE:  TUR of bladder tumor (small 1 cm).   ANESTHESIA:  General.   SURGEON:  Corky Downs, M.D.   BRIEF HISTORY:  This 77 year old patient has had recurrent bladder  cancer, all low grade.  The first was in 2003, recurrence in 12/2002 and  04/2003.  He was then given BCG until it was stopped because of a fever  and rash in August of 2004, which was his last treatment.  He had a left  nephroureterectomy in 2002, cardiac cath in January of 2009.  He is on  Plavix and aspirin, which was stopped a week ago.  He takes care of his  wife who has Alzheimer's.   The patient was placed on the operating table in the dorsal lithotomy  position.  After the satisfactory induction of general anesthesia, he  was prepped and draped with Betadine in the usual sterile fashion.  He  was given IV Cipro.  Time-out was then performed and the patient and  procedure then reconfirmed.  The panendoscope was passed.  The anterior  urethra was normal.  The posterior urethra showed mild trilobar  hyperplasia of the prostate.  The bladder was entered.  The bladder had  scars from previous resections but on the right posterior base, there  seemed to be an area of rather marked inflammation, somewhat raised up  mucosa adjacent to an old scar.  This was photographed.  Using the cold  cup biopsy forceps, 3 biopsies seemed to completely remove the lesion.  The base was then fulgurated  with the Bugbee electrode to effect good  hemostasis.  The total area fulgurated was about 1.5 cm.  The bladder  was drained.  Hemostasis was good.  The scope was removed from the  bladder, which was drained.  A B and O suppository was inserted.  The  patient was then taken to the recovery room in good condition.  He was  later discharged as an outpatient with detailed written instructions.      Corky Downs, M.D.  Electronically Signed     HMK/MEDQ  D:  02/18/2009  T:  02/19/2009  Job:  [id number]

## 2011-05-10 NOTE — Assessment & Plan Note (Signed)
[facility]                            CARDIOLOGY OFFICE NOTE   RICARD, PENDERGRAPH                        MRN:          [id number]  DATE:03/24/2008                            DOB:          July 30, 1925    Mr. Rubinstein is doing well.  I saw him last in January 2009.  His last  catheterization had shown nonobstructive disease.  He was doing well.  He does have moderate disease in the LAD.  There had been no marked  progression.  He has not had any recurrent chest pain since then.  He  had a problem with a cough and it is almost completely resolved.  Overall he is doing well.   PAST MEDICAL HISTORY:   ALLERGIES:  No known drug allergies.   MEDICATIONS:  Aspirin, Uroxatral, multivitamin, Lipitor, Nexium, Plavix,  isosorbide and Diovan.   OTHER MEDICAL PROBLEMS:  See the list on the note of February 05, 2007.   PHYSICAL EXAM:  Blood pressure is 110/62 with a pulse of 66.  Weight is  166 pounds and the patient is stable.  LUNGS:  Clear.  CARDIAC:  Reveals an S1 with an S2, but no clicks or significant  murmurs.   Mr. Henningsen is stable.  He had a brief visit today.  I will see him back in  6 months.     Carlena Bjornstad, MD, [facility]  Electronically Signed    JDK/MedQ  DD: 03/24/2008  DT: 03/24/2008  Job #: [id number]   cc:   Darrick Penna. Linna Darner, MD,FACP,FCCP

## 2011-05-10 NOTE — Assessment & Plan Note (Signed)
[facility]                            CARDIOLOGY OFFICE NOTE   DAMARIYON, Collin [name]                        MRN:          [id number]  DATE:08/07/2007                            DOB:          September 01, 1925    Mr. Lewelling is doing very well.  He has not had any recurrent chest  discomfort.  He has a history of an old inferior MI with some peri-  infarct ischemia, but he is quite stable and doing well, and he  continues to mow his yard.   PAST MEDICAL HISTORY:   ALLERGIES:  No known drug allergies.   MEDICATIONS:  Mavik.  Aspirin.  Uroxatral.  Multivitamin.  Lipitor.  Nexium.  Imdur.  Plavix.   OTHER MEDICAL PROBLEMS:  See the list on my note of February 05, 2007.   REVIEW OF SYSTEMS:  The review of systems today is negative.   PHYSICAL EXAM:  Blood pressure 100/72, pulse 63.  The patient is oriented to person, time, and place.  His affect is  normal.  HEENT:  Reveals no xanthelasma.  He has normal extraocular motions.  There are no carotid bruits.  There is no jugular venous distension.  LUNGS:  Clear.  Respiratory effort is not labored.  CARDIAC:  Reveals an S1 with an S2.  There are no clicks or significant  murmurs.  ABDOMEN:  Soft.  He has normal bowel sounds.  He has no peripheral edema.   EKG is normal.   PROBLEMS:  Listed on my note of February 05, 2007.  1. Coronary artery disease, stable.  2. History of decreased platelet count.  A CBC that was checked in      February of 2008 reveals 133,000 platelet count.  Cardiac status is      stable.  The patient needs a fasting lipid profile and then I will      see him back in 1 year for cardiology followup.     Carlena Bjornstad, MD, [facility]  Electronically Signed    JDK/MedQ  DD: 08/07/2007  DT: 08/08/2007  Job #: [id number]   cc:   Darrick Penna. Linna Darner, MD,FACP,FCCP

## 2011-05-10 NOTE — H&P (Signed)
NAMEMARQUIL, Collin [name]                 ACCOUNT NO.:  [id number]   MEDICAL RECORD NO.:  [id number]          PATIENT TYPE:  INP   LOCATION:  3707                         FACILITY:  [facility]   PHYSICIAN:  Wallis Bamberg. Johnsie Cancel, MD, FACCDATE OF BIRTH:  1925/09/26   DATE OF ADMISSION:  01/04/2008  DATE OF DISCHARGE:                              HISTORY & PHYSICAL   PRIMARY CARDIOLOGIST:  Dr. Daryel November   PRIMARY CARE:  Dr. Unice Cobble   Collin [name] is being seen by Dr. Leonides Sake today.  He is a delightful 77-  year-old Caucasian male with known history of coronary artery disease  status post ST-elevated MI in 2005 at which time he received a Taxus  drug-eluting stent to the RCA .  Presented in 2007 with chest discomfort  that was felt to be atypical.  Had a stress Myoview that showed mild  peri-infarct ischemia.  The patient was treated medically, states he has  been stable since that time.  However, over the last 3 days he has had  some steady chest discomfort that has occurred mostly with activity and  stops with rest.  Today he got up again moving around, experienced the  chest discomfort again wit exertion.  He took some antacid without  relief.  Collin [name] has a wife with Alzheimer's.  He cares for her and  maintains the house and does all the cooking, cleaning, etc.  Normally  he is doing these activities without any problems.  He has noticed some  mild dyspnea on exertion recently also.  Chest discomfort has occurred  the last 3 days while he is doing his normal routine.  He describes it  as a burning band across the chest similar to the discomfort he had with  his MI but not nearly as intense.  He has also complained of soreness in  epigastric area.  He has had some belching.  Currently he is complaining  of soreness in the epigastric area with some mild burning across his  chest.   No known drug allergies.   MEDICATIONS:  1. Mavik 1 mg.  2. Plavix 75.  3. Aspirin 81.  4. Isosorbide  30.  5. Lipitor 20.  6. Uroxatral 10.  7. Nexium 40.  8. Multivitamins.   PAST MEDICAL HISTORY:  Includes:  1. Coronary artery disease, a STEMI in 2005 with a Taxus drug-eluting      stent to the RCA.  Stress Myoview in May 2007 showing mild peri-      infarct ischemia.  2. Hypertension.  3. Dyslipidemia.  4. Renal carcinoma status post left nephrectomy.  5. Multiple bladder tumors status post transurethral resection in      2003, 2004 and 2007.  6. Cholecystectomy in 2004.  7. GERD.  8. Normocytic anemia.  9. COPD by chest x-ray.   SOCIAL HISTORY:  Lives in [place] with his spouse.  He is retired  from [employer].  He has adult children.  He quit using tobacco  products greater than 20 years ago.  Exercise includes household chores,  running errands.  He tries to follow a heart-healthy diet.  Denies any  EtOH or drug or herbal medication use.   FAMILY HISTORY:  Mother deceased in her 88s with history of CHF.  Father  unknown.  He has a brother who died from coronary artery disease, heart  failure. but had a history of EtOH abuse.   REVIEW OF SYSTEMS:  Positive for headaches, chest pain, dyspnea on  exertion, occasional lightheadedness and GERD symptoms.   PHYSICAL EXAMINATION:  Temperature 97.5, pulse 66, respirations 20,  blood pressure 117/72, saturation 95% on room air.  GENERALLY:  No acute distress.  Normocephalic, atraumatic.  Pupils  equal, round, react to light.  Sclerae are clear.  Skin is warm and dry.  NECK:  Supple without lymphadenopathy.  No bruits, no JVD.  CARDIOVASCULAR EXAM:  Reveals S1, S2.  Regular rate and rhythm.  LUNGS:  Clear to auscultation.  ABDOMEN:  Soft, nontender, positive bowel sounds.  LOWER EXTREMITIES:  Without clubbing, cyanosis or edema.  NEUROLOGICALLY:  Alert and oriented x3.   Chest x-ray showing no acute findings, chronic mild central upper  bronchial markings.  V/Q:  Moderate low probability for PE.  EKG:  Sinus  rhythm  at 64.  Lab work i-STAT:  H&H 14.3 and 42, sodium 134, potassium  4.5, BUN and creatinine 24 and 1.8, glucose 96.  First set of point-of-  care markers:  Troponin less than 0.05.  PT/INR 12.8 and 0.9, D-dimer  elevated at 2.45.   Dr. Leonides Sake in to examine and assess the patient with complaints of  chest discomfort with known history of coronary artery disease.  EKG  without acute ST or T-wave changes.  The patient will be admitted, cycle  cardiac enzymes.  Last diagnostic catheterization was in 2005 that time  of MI.  Will plan on anticoagulating the patient, hydrate over the  weekend, and proceed with cardiac catheterization on Monday.      Rosanne Sack, ACNP      Wallis Bamberg. Johnsie Cancel, MD, [facility]  Electronically Signed    MB/MEDQ  D:  01/04/2008  T:  01/05/2008  Job:  [id number]

## 2011-05-13 NOTE — Discharge Summary (Signed)
[facility]  Patient:    Collin [name], Collin [name]                        MRN: [id number] Adm. Date:  [id number] Disc. Date: 03/06/01 Attending:  Kristine Royal CC:         Darrick Penna. Linna Darner, M.D. [facility]   Discharge Summary  DISCHARGE DIAGNOSES: 1. 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 2. Left hydronephrosis with poorly functioning left kidney. 3. Hematuria.  PROCEDURES:  Left nephroureterectomy on February 28, 2001.  BRIEF HISTORY:  This 77 year old, retired [employer] gentleman enters for left nephroureterectomy for probable left ureteral tumor. He presented in late January with total gross painless hematuria. Workup included a CT scan which showed poorly functioning hydronephrotic left kidney down to a narrowed area just below the pelvic brim. Retrograde showed that he probably had a ureteral cancer at this level. I was able to get a stent past the obstruction but could not get the ureteroscope. He had a lot of bloody urine after I passed the stent. His creatinine was 1.9, BUN 24, hematocrit 38 and he enters for left nephroureterectomy.  LABORATORY DATA:  Showed renal function as above. His preoperative creatinine was 1.9.  HOSPITAL COURSE:  He was taken to the operating room on the day of admission where he underwent a left nephroureterectomy through a subcostal and a left Gibson incision. 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 The margins were clear. The tumor was grade 2/3 making him a T1N0MX transitional cell carcinoma of the ureter. Again the margins were not involved. Postoperatively his hematocrit dropped to a low of 28% but remained stable. He never received blood transfusions. His creatinine actually dropped to 1.5 and remained stable. He did well and started on a liquid diet by the first postoperative day and then was  progressed up to a regular diet by the fifth postoperative day. His Jackson-Pratt drain was removed on postoperative day three but he had a Foley catheter which he will go home with. He was discharged on postoperative day six after removing his staples and steri-stripping the wound. Instructions with the catheter were given and he will come back in a week for follow-up. He is given Percocet for pain only. He went home in improved ambulatory condition on a regular diet. No further treatment is planned of this tumor which should have been removed by the surgery performed. DD:  03/05/01 TD:  03/06/01 Job: [id number]

## 2011-05-13 NOTE — Discharge Summary (Signed)
Collin [name], Collin [name]                 ACCOUNT NO.:  [id number]   MEDICAL RECORD NO.:  [id number]          PATIENT TYPE:  INP   LOCATION:  [id number]                         FACILITY:  [facility]   PHYSICIAN:  Rosanne Sack, ACNP  DATE OF BIRTH:  1925-05-02   DATE OF ADMISSION:  01/14/2007  DATE OF DISCHARGE:                               DISCHARGE SUMMARY   Audio too short to transcribe (less than 5 seconds)      Rosanne Sack, ACNP     MB/MEDQ  D:  01/16/2007  T:  01/16/2007  Job:  [id number]

## 2011-05-13 NOTE — Op Note (Signed)
   NAMEJIONNI, Collin [name]                           ACCOUNT NO.:  [id number]   MEDICAL RECORD NO.:  [id number]                   PATIENT TYPE:  AMB   LOCATION:  NESC                                 FACILITY:  [facility]   PHYSICIAN:  Corky Downs, M.D.          DATE OF BIRTH:  06-06-1925   DATE OF PROCEDURE:  10/02/2002  DATE OF DISCHARGE:  10/02/2002                                 OPERATIVE REPORT   PREOPERATIVE DIAGNOSES:  Tumor right anterior wall, posterior wall bladder.   POSTOPERATIVE DIAGNOSES:  Tumor right anterior wall, posterior wall bladder.   OPERATION:  Transurethral resection of bladder tumors x3 right anterior wall  and right posterior wall.   ANESTHESIA:  General.   SURGEON:  Corky Downs, M.D.   BRIEF HISTORY:  This 77 year old patient is admitted with some bladder  tumors found on the right anterior and right posterior wall on routine  office surveillance cystoscopy. He has had no irritative bladder symptoms or  blood but did have a high grade stage 2 TCC of the left ureter with left  nephroureterectomy March 2002. He is admitted now for resection of these  lesions.   DESCRIPTION OF PROCEDURE:  The patient was placed on the operating table in  the dorsal lithotomy position and after satisfactory induction of general  anesthesia was prepped and draped with Betadine in the usual sterile  fashion. The panendoscope was inserted and the bladder carefully inspected.  Pictures were made of these three lesions, the three on the right anterior  wall and one on the right posterior wall and then these were each removed  with the rigid biopsy forceps. Several biopsies were made on each. The base  was then fulgurated with a Bugbee electrode to affect good hemostasis. The  bladder was drained, scope removed and a 16 Foley catheter was inserted. He  was taken to the recovery room in good condition to be later discharged as  an outpatient and will remove his catheter  in 48 hours. Sent home in  improved ambulatory condition on a regular diet.                                               Corky Downs, M.D.    HMK/MEDQ  D:  10/02/2002  T:  10/02/2002  Job:  [id number]

## 2011-05-13 NOTE — Discharge Summary (Signed)
NAMEULMER, EREKSON                           ACCOUNT NO.:  [id number]   MEDICAL RECORD NO.:  [id number]                   PATIENT TYPE:  INP   LOCATION:  [id number]                                 FACILITY:  [facility]   PHYSICIAN:  Gwendolyn Grant, M.D. [facility]          DATE OF BIRTH:  1925/10/09   DATE OF ADMISSION:  01/02/2004  DATE OF DISCHARGE:  01/06/2004                                 DISCHARGE SUMMARY   DISCHARGE DIAGNOSES:  1. Status post subendothelial myocardial infarction.  2. Status post stent to the right coronary artery.  3. Abnormal CT with pulmonary nodules.  4. Hyperglycemia.  5. Normocytic anemia.  6. Hypertension.   BRIEF ADMISSION HISTORY:  [name] is a 77 year old white male with a  history of transitional cell cancer of the bladder.  On the morning prior to  admission he developed chest pain that was burning in quality.  It resolved,  but then recurred on the day of admission.  He did have some associated  nausea and diaphoresis, shortness of breath, and bilateral elbow pain.  He  has also had increased pain with exertion.  Reportedly, the patient had a  stress test about a year ago.  This was a preoperative stress test that was  negative.   PAST MEDICAL HISTORY:  1. Transitional cell carcinoma of the bladder status post left     nephroureterectomy in March of 2002.  2. Status post bladder resection in October of 2003 for recurrent cancer.  3. Status post BCG in May and August of 2004.  4. Hypercholesterolemia.  5. Status post cholecystectomy.  6. History of gastroesophageal reflux disease and hiatal hernia.  7. Status post left cornea transplant.   HOSPITAL COURSE:  Problem 1 - CARDIOVASCULAR:  The patient presented with  chest pain.  The patient had positive cardiac enzymes.  The patient was  admitted for treatment of an MI.  The patient was given aspirin and started  on IV heparin.  The patient underwent cardiac catheterization on January 03, 2004.  The  patient had a stent placed in the RCA which was 100% occluded.  The patient had residual disease in the LAD with 70-80% proximal stenosis  and 80% distal stenosis.  Circumflex revealed 70-80% at the OM.  The patient  had an EF of 40%.  The plan was to treat the residual coronary disease with  medical treatment.  The patient has done well since his catheterization.  Serial cardiac enzymes have normalized.  The patient is tolerating medical  treatment of aspirin, Plavix, Lipitor, and Mavik.   Problem 2 - ONCOLOGY:  As noted, the patient has history of recurrent  transitional cell carcinoma and has undergone surgery as well as BCG.  The  patient had an abnormal chest x-ray on admission revealing nodules.  This  revealed enumerable small parenchymal pulmonary nodules throughout the lungs  bilaterally.  Several of these were calcified.  However, many of the nodules  were not calcified.  It was felt these could represent multiple tiny  granulomata from prior infection versus nodules versus granules.  Comparison  with prior CT is not available.  They therefore recommended follow-up CT in  eight weeks to rule out any metastatic disease.   Problem 3 - HYPERGLYCEMIA:  The patient had some mildly elevated blood  sugars in the low 100s.  Hemoglobin A1C was elevated at 6.2%.  We did talk  to the patient about a diabetic diet and have arranged for some education  both inpatient and outpatient.   DISCHARGE LABORATORIES:  Hemoglobin 11.3.  Magnesium level at 2.2.  BUN 25,  creatinine 1.5.  Coags were normal.  Hemoglobin A1C was 6.2%.  LFTs were  normal.  Cholesterol was elevated at 218, triglycerides 230, HDL 33, LDL  139.   DISCHARGE MEDICATIONS:  1. Protonix 40 mg daily.  2. Aspirin 325 mg daily.  3. Plavix 75 mg daily.  4. Lipitor 40 mg daily.  5. Mavik 1 mg daily.   FOLLOWUP:  The patient should follow up with Dr. Linna Darner in two to three  weeks.  Arrangements for follow up with Dr. Ron Parker in two  weeks to be made as  well.      Helayne Seminole, P.A. [facility]                  Gwendolyn Grant, M.D. [facility]    LC/MEDQ  D:  01/06/2004  T:  01/06/2004  Job:  [id number]   cc:   Dola Argyle, M.D.   Darrick Penna. Linna Darner, M.D. [facility] Rutherford Guys., M.D.  [address] [place]  [place] [address]  Fax: [phone]

## 2011-05-13 NOTE — H&P (Signed)
Collin [name], Collin [name]                 ACCOUNT NO.:  [id number]   MEDICAL RECORD NO.:  [id number]          PATIENT TYPE:  INP   LOCATION:  3715                         FACILITY:  [facility]   PHYSICIAN:  Denice Bors. Stanford Breed, MD, [place] OF BIRTH:  [birth year]   DATE OF ADMISSION:  01/14/2007  DATE OF DISCHARGE:                              HISTORY & PHYSICAL   PRIMARY CARE PHYSICIAN:  Darrick Penna. Linna Darner, MD,FACP,FCCP.   PRIMARY CARDIOLOGIST:  Carlena Bjornstad, MD, [facility]   CHIEF COMPLAINT:  Chest pain.   HISTORY OF PRESENT ILLNESS:  Mr. Kremin is a 66-year male patient, with a  history of coronary disease status post inferior ST-elevation myocardial  infarction January 2005 treated with a drug-eluting stent to the RCA,  who presents to emergency room today with complaints of 2 weeks of  constant chest pressure.  Prior to that, he had been chest pain free.  He had seen Dr. Ron Parker back in the springtime of 2007.  At that time, he  was complaining of some exertional chest pain.  He had Myoview scan that  was low risk, and he was treated medically.  The patient notes that over  the past 2 weeks, his chest pressure has been constant and never goes  away.  He notes that throughout the day it gets worse.  It gets worse  with activity as well.  He notes associated shortness of breath.  There  is no associated nausea or diaphoresis.  Today his symptoms were much  worse, and he felt lightheaded, so he decided to come to the emergency  room.  He notes that his chest pressure symptoms are similar to what he  had when he had a myocardial infarction.  In the emergency room, he was  still having chest pressure.  His initial point-of-care enzymes and  electrocardiogram are unremarkable.   PAST MEDICAL HISTORY:  Significant for:  1. Coronary artery disease status post inferior ST-elevation      myocardial function January 2005 treated with a TAXUS drug-eluting      stent to the RCA.  Catheterization at that time  revealed residual      LAD stenosis of 70-80%, first diagonal 80%, obtuse marginal 70-80%.      His EF was 40%.  Myoview scan, as noted above in May 2007, was      positive for inferior scar with very mild peri-infarct ischemia and      EF rated at 52%.  2. Hyperlipidemia.  3. Hypertension.  4. History of pulmonology nodules followed by primary care.  5. Status post left nephrectomy secondary renal carcinoma.  6. History bladder cancer status post resection.  7. Gastroesophageal reflux disease.  8. History normocytic anemia.   MEDICATIONS:  1. Plavix 75 mg daily.  2. Mavik 1 mg daily.  3. Aspirin 81 mg daily.  4. UroXatral 10 mg daily.  5. Multivitamin daily.  6. Lipitor 20 mg daily.  7. Nexium 20 mg daily.   ALLERGIES:  No known drug allergies.   SOCIAL HISTORY:  Patient lives in [place].  He is retired.  He is  married and has one adult son.  He quit smoking 20 years ago and denies  any alcohol abuse.   FAMILY HISTORY:  Significant for coronary disease in a brother who had  myocardial infarction.   REVIEW OF SYSTEMS:  Please see HPI.  He denies any fevers, chills,  headache, sore throat, orthopnea, PND, edema, palpitations, syncope,  claudication, cough, dysuria, hematuria, numbness, nausea, vomiting,  diarrhea, bright red blood per rectum, melena, dysphagia or odynophagia.  The rest of the Review of Systems are negative.   PHYSICAL EXAMINATION:  GENERAL:  Well-nourished, well-developed elderly  male.  VITAL SIGNS: Blood pressure 98/60, pulse 72, respirations 15,  temperature 97.6.  HEENT:  Head normocephalic and atraumatic.  Eyes: PERRLA.  EOMI.  Sclerae are clear.  NECK: Without JVD.  LYMPH:  Without lymphadenopathy.  ENDOCRINE: Without thyromegaly.  Carotids without bruits bilaterally.  CARDIAC: Normal S1-S2.  Regular rate and rhythm without murmurs.  LUNGS: Clear to auscultation bilaterally without wheezing, rhonchi or  rales.  ABDOMEN: Soft, nontender with  normoactive bowel sounds.  No  organomegaly.  EXTREMITIES: Without clubbing, cyanosis or edema.  MUSCULOSKELETAL:  Without spine or CVA tenderness.  NEUROLOGIC: He is alert and oriented x3.  Cranial Nerves: II-XII grossly  intact.  SKIN:  Warm and dry.   Chest x-ray shows no acute changes.  There are chronic mild  peribronchial thickening changes without focal air space opacity.   EKG reveals sinus rhythm, heart rate of 74 with inferolateral Q-waves,  no acute changes.   LABORATORY DATA:  Hemoglobin 14.6, hematocrit 43, sodium 135, potassium  3.7, BUN 18, creatinine 1.5, glucose 78.  Point-of-care markers negative  x2.  INR 1.0.   IMPRESSION:  1. Chest pain worrisome for unstable angina pectoris.  2. Coronary disease status post inferior ST-elevation myocardial      infarction January 2005 treated with TAXUS stent to the right      coronary artery.Marland Kitchen      a.     Residual coronary artery disease as noted above.      b.     Low-risk Myoview scan May 2007.      c.     Ejection fraction in the range of 40-50%.  3. Hypertension.  4. Hyperlipidemia.  5. Renal insufficiency.  6. Status post left nephrectomy secondary to carcinoma.  7. History bladder cancer.  8. Gastroesophageal reflux disease.  9. History of pulmonary nodules followed by primary care.   PLAN:  The patient was also interviewed and examined by Dr. Kirk Ruths.  His initial EKG and enzymes were unremarkable.  His chest  pain is atypical, and that has been continuous for a greater than 1  week.  At this point in time, we plan to admit him and continue to cycle  cardiac enzymes.  If negative, Myoview would be favored over  catheterization, but this will need to be discussed further with Dr.  Ron Parker who set the patient up for a Myoview scan in May 2007.  As noted  above, this was a low-risk scan.  We will also try to rule out a gastrointestinal source by checking on liver enzymes, amylase and  lipase.  Will continue  on aspirin, Plavix, statin,  ACE inhibitor and  add low-dose beta blocker.  He will also be maintained on heparin per  pharmacy at this point in time.      Richardson Dopp, PA-C      Denice Bors. Stanford Breed, MD, [facility]  Electronically  Signed    SW/MEDQ  D:  01/14/2007  T:  01/14/2007  Job:  [id number]   cc:   Darrick Penna. Linna Darner, MD,FACP,FCCP

## 2011-05-13 NOTE — Discharge Summary (Signed)
Collin [name], Collin [name]                 ACCOUNT NO.:  [id number]   MEDICAL RECORD NO.:  [id number]          PATIENT TYPE:  INP   LOCATION:  [id number]                         FACILITY:  [facility]   PHYSICIAN:  Ethelle Lyon, MD  DATE OF BIRTH:  09/15/1925   DATE OF ADMISSION:  01/14/2007  DATE OF DISCHARGE:  01/16/2007                               DISCHARGE SUMMARY   DISCHARGING PHYSICIAN:  Dr. Sabino Snipes.   PRIMARY CARDIOLOGIST:  Dr. Daryel November.   PRIMARY CARE:  Darrick Penna. Linna Darner, MD, FACP, FCCP.   DISCHARGE DIAGNOSES:  1. Chest pain.  Negative cardiac enzymes this admission.  2. Renal insufficiency with an elevated creatinine of 1.7.  3. Low platelet count.   PAST MEDICAL HISTORY:  1. Includes coronary artery disease status post inferior ST-elevated      MI in January 2005 with a Taxus drug-eluting stent to the RCA,      status post Myoview in May 2007 positive for inferior scar with      very mild peri-infarct ischemia and EF of 52.  2. Hyperlipidemia.  3. Hypertension.  4. History of pulmonary nodules followed by primary care.  5. Status post left nephrectomy secondary to renal carcinoma.  6. History of bladder cancer status post resection.  7. GERD.  8. History of normocytic anemia.  9. Remote history of tobacco use.   HOSPITAL COURSE:  [name] is a 77 year old patient of Dr. Ron Parker with  known history of coronary artery disease who presented to [name] on  day of admission complaining of chest discomfort for 2 weeks.  He  described it as a constant chest pressure.  Prior to that, he had been  chest free.  The patient's cardiac enzymes in the emergency room were  negative.  EKG unremarkable.  Blood pressure 98/60.  Chest x-ray showed  no acute findings.  Initial blood work showed a potassium of 3.7, BUN  18, creatinine 1.5.  Chest pain was felt to be worrisome for unstable  angina pectoris in the setting of known coronary artery disease.  The  patient was admitted for  observation.  Cardiac enzymes were cycled.  Dr.  Albertine Patricia came to see the patient on day of discharge.  The patient without  further episodes of chest pain or nausea, afebrile, blood pressure  stable at 106/62.  The patient is being discharged home with  instructions to follow up with Dr. Ron Parker as already scheduled for  February 11 and I have scheduled the patient for an exercise Myoview  January 28 at 12:30.  The patient has been given the pre-stress test  instructions.   LAB WORK AT DISCHARGE:  Includes a potassium of 4.4, glucose 107, BUN  22, creatinine 1.68.  CBC:  Hemoglobin 12.9, hematocrit 36.8, platelet  count of 89.  Lipid panel this admission includes total cholesterol 112,  triglycerides 84, HDL 41, LDL 54.   DISCHARGE MEDICATIONS:  1. Imdur 30 mg daily.  2. Nexium 20 mg daily.  3. Lipitor 20 mg daily.  4. Plavix 75.  5. Mavik 1 mg.  6. UroXatral  as previously prescribed.  7. Aspirin 81 mg daily.   DURATION OF DISCHARGE ENCOUNTER:  30 minutes.      Rosanne Sack, ACNP      Ethelle Lyon, MD  Electronically Signed    MB/MEDQ  D:  01/16/2007  T:  01/16/2007  Job:  [id number]   cc:   Darrick Penna. Linna Darner, MD,FACP,FCCP

## 2011-05-13 NOTE — Op Note (Signed)
[facility]  Patient:    Collin [name], Collin [name]                        MRN: [id number] Proc. Date: 02/21/01 Adm. Date:  [id number] Attending:  Kristine Royal                           Operative Report  PREOPERATIVE DIAGNOSIS:  Left hydronephrosis, hematuria.  POSTOPERATIVE DIAGNOSIS:  Left ureteral stricture, left hydronephrosis, probable tumor, small ureteral stone, hematuria and left hydronephrosis.  OPERATION:  Cystoscopy, retrograde pyelogram, ureteroscopy with removal of small ureteral stone and insert ureteral stent.  SURGEON:  Shanda Bumps., M.D.  ANESTHESIA:  General.  INDICATIONS:  This 77 year old patient was admitted with painless gross hematuria late January 2002.  An IVP on 02/01/01 showed a nonfunctioning left kidney.  Cystoscopy 02/08/01 was negative.  He had a CT scan which showed what looked like a stricture of the distal left ureter which soft tissue abnormalities around the ureter and marked hydronephrosis with very thin renal parenchyma cortex.  He is admitted now for cystoscopy, retrograde, possible ureteroscopy.  He has not smoked for 10 or more years.  DESCRIPTION OF PROCEDURE:  The patient was placed on the operating table in the dorsal lithotomy position.  After satisfactory induction of general anesthesia, he was prepped and draped with Betadine and given IV antibiotics. A #21 pan endoscope was used to inspect the anterior urethra.  No strictures were seen.  The posterior urethra was mildly obstructing.  The bladder was entered.  The bladder was smooth and nontrabeculated and had no abnormalities of the mucosa.  The left orifice looked normal.  I inserted a left ureteral open ended catheter and performed an occlusive retrograde which demonstrated a long area of stricture, probably 3-4 cm beginning at the pelvic brim on the left side.  There was marked hydronephrosis above this and a very small thin caliber of  ureter below.  I was able to pass a Glidewire through the narrowed area to the level of the kidney where there was marked tortuosity of the UPJ. With a little resistance, I was able to get the glidewire up into the renal pelvis and then advanced the open ended stent.  There was marked hydronephrosis of quite bloody urine.  I sent this for cytology.  I then passed a 0.38 floppy tipped guide wire into the open ended catheter.  Over the guidewire, a 10 cm ureteral balloon dilator was inserted and inflated to 14 atmospheres.  There was a little waste of narrowing of the distal left ureter but it was passed up to the level of the stricture.  This was kept maintained for five timed minutes at 14 atmospheres.  When this was done, a short ureteroscope, 6 [place] was then passed a long the guidewire into the ureter.  A small stone was seen and grasped and removed intact.  Another pass up to the level of the stricture just below the pelvic brim, I could see the stricture but could not get through it.  The tissue was somewhat ratty in appearance typical of probable tumor.  I removed the scope and then over the guidewire was able to finally pass a 8.2 Multilink ureteral stent over the guidewire up to the level of the kidney.  As the guidewire was removed, the stent was in good position.  The bladder was drained, scope removed and  the patient allowed to return to the recovery room in good condition to be later discharged as an outpatient with detailed written instructions.  He will probably have to have a left nephroureterectomy most likely next week.  Detailed written instructions were given.DD:  02/21/01 TD:  02/21/01 Job: [id number]

## 2011-05-13 NOTE — Op Note (Signed)
NAMETRACE, HANRAHAN                           ACCOUNT NO.:  [id number]   MEDICAL RECORD NO.:  [id number]                   PATIENT TYPE:  AMB   LOCATION:  DAY                                  FACILITY:  [facility]   PHYSICIAN:  Isabel Caprice. Hassell Done, M.D.             DATE OF BIRTH:  09-Jun-1925   DATE OF PROCEDURE:  DATE OF DISCHARGE:                                 OPERATIVE REPORT   CCS#:  [id number]   PREOPERATIVE DIAGNOSIS:  Cholecystitis.   POSTOPERATIVE DIAGNOSIS:  Acute and chronic cholecystitis.   PROCEDURE:  Laparoscopic cholecystectomy with intraoperative cholangiogram.   SURGEON:  Isabel Caprice. Hassell Done, M.D.   ASSISTANT:  Sammuel Hines. Marlou Starks, M.D.   ANESTHESIA:  General endotracheal.   DESCRIPTION OF PROCEDURE:  Beorn Portman is a 77 year old gentleman that I  initially saw in the [facility] Emergency Room on August 23 with a three week  history of abdominal pain with some gallbladder wall thickening.  Arrangements were made for him to have laparoscopic cholecystectomy today.   He was taken to room 1, given general anesthesia. He got a gram of Ancef  preop. The abdomen was prepped with Betadine and draped sterilely. I entered  through Hasson technique through the umbilicus without difficulty and  inserted three other trocars in the upper abdomen and on the patient's right  side. The gallbladder was completely walled off with the omentum and was  densely stuck down. I began using the harmonic scalpel taking down adhesions  very slowly and carefully grasping the gallbladder and eventually getting it  stripped away and was able to expose Calot's triangle. I dissected this  free, put a clip up on the gallbladder and incised the cystic duct and  inserted a Reddick catheter. A dynamic cholangiogram was performed using a C-  arm which showed good filling of the common duct and intrahepatic radicles  and free flow into the duodenum. The cystic duct was then triple clipped,  divided and the gallbladder  was removed from the gallbladder bed using the  hook electrocautery. There was a little bleeder on the lateral wall which I  clipped and then coagulated and put a piece of Surgicel on. I removed the  gallbladder in toto and then removed it through the umbilicus.   Reinspecting the gallbladder bed, no bleeding or bile leaks were noted. The  port site was then repaired with a figure-of-eight and with simple suture of  #0 Vicryl. The abdomen was then deflated and the skin was closed with 4-0  Vicryl with Benzoin and Steri-Strips. The patient seemed to tolerate the  procedure well and was taken to the recovery room in satisfactory condition.    FINAL DIAGNOSIS:  Acute and chronic cholecystitis status post Laparoscopic  cholecystectomy with intraoperative cholangiogram.  Isabel Caprice Hassell Done, M.D.    MBM/MEDQ  D:  08/20/2003  T:  08/20/2003  Job:  [id number]   cc:   Darrick Penna. Linna Darner, M.D. [facility]

## 2011-05-13 NOTE — Op Note (Signed)
NAMEROCKNE, SALDUTTI                 ACCOUNT NO.:  [id number]   MEDICAL RECORD NO.:  [id number]          PATIENT TYPE:  AMB   LOCATION:  [facility]                         FACILITY:  [place]:  Corky Downs, M.D.DATE OF BIRTH:  1925-08-17   DATE OF PROCEDURE:  07/26/2006  DATE OF DISCHARGE:                                 OPERATIVE REPORT   PREOPERATIVE DIAGNOSIS:  Possible recurrent bladder tumor, anterior/left  lateral wall.   POSTOPERATIVE DIAGNOSIS:  Possible recurrent bladder tumor, anterior/left  lateral wall.   OPERATION:  Transurethral resection of the bladder (3 cm) left anterior  lateral wall.   ANESTHESIA:  General.   SURGEON:  Corky Downs, M.D.   BRIEF HISTORY:  [age]-year-old patient enters with possible recurrent  bladder tumor in the dome and on the left lateral wall.  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  He had  recurrent bladder tumor January 2004 and May 2004, and then had BCG until  August 2004, when it was discontinued secondary to fever and rash.  He had  an MI in January 2005, had a stent, but enters now after routine  surveillance cystoscopy showed possible recurrent tumor in the anterior wall  and also on the left lateral wall.   DESCRIPTION OF PROCEDURE:  The patient was placed on the operating table in  dorsal lithotomy position.  After satisfactory induction of general  anesthesia, was prepped and draped with Betadine in the usual sterile  fashion.  Ancef was given IV.  The panendoscope was used to inspect the  anterior urethra.  Posterior urethra was nonobstructing and the bladder was  entered.  He did have 2+ trabeculations noted.  Scars from previous  resections were also noted.  At the anterior bladder wall, there were two  small hyperemic erythematous areas and just not far away on the left lateral  wall was another patch of erythema and irritation.  Both of these were close  to  previous resections.  After photographing these lesions, the entire area  was resected with the cold cup biopsy forceps, encompassing the entire area  of about 3 cm.  The base was then fulgurated with the Bugbee electrode to  effect good hemostasis and the bladder was drained, scope removed.  I did  not leave a Foley catheter but he will need to void before he leaves  outpatient and will return in a week for further care and treatment.      Corky Downs, M.D.  Electronically Signed     HMK/MEDQ  D:  07/26/2006  T:  07/26/2006  Job:  [id number]

## 2011-05-13 NOTE — Cardiovascular Report (Signed)
Collin [name], Collin [name]                           ACCOUNT NO.:  [id number]   MEDICAL RECORD NO.:  [id number]                   PATIENT TYPE:  INP   LOCATION:  2924                                 FACILITY:  [place]   PHYSICIAN:  Eustace Quail, M.D.                  DATE OF BIRTH:  01/24/1925   DATE OF PROCEDURE:  01/03/2004  DATE OF DISCHARGE:                              CARDIAC CATHETERIZATION   CLINICAL HISTORY:  Mr. Coney is 77 years old and has a history of left  nephrectomy and a history of bladder cancer and an abnormal chest x-ray  which could represent metastatic disease. He was admitted to the hospital  yesterday morning with prolonged chest pain and his initial troponins were  negative and his EKGs were interpreted as normal.  He subsequently late in  the evening was found to have a positive CK.  He indicated that his chest  pain had been persistent throughout the day and his EKGs early this morning  showed 1 mm inferior ST elevation with some small inferior Q waves.  He was  felt to be having an acute diaphragmatic wall infarction.  The exact onset  of the infarction is not clear, but we labeled it as 7:30 the morning of  January 7.   PROCEDURE:  The procedure was performed via the right femoral artery using  arterial sheath and 6 French preformed coronary catheters.  A frontal  arterial puncture was performed and Omnipaque contrast was used.  We had  trouble with the equipment in lab 4 and had to break to move to lab 3.  After completing the diagnostic study, made a decision to proceed with  intervention on the right coronary artery.   The patient had been given aspirin and heparin prior to arrival in the cath  lab and we gave additional heparin to prolong an ACT to greater than 200  seconds and we gave double bolus integrilin infusion.  We gave 300 mg of  Plavix at the end of the procedure.  We used a JR-4 6 [place] guiding  catheter with side holes and a PT-2 light support  wire.  We crossed the  lesion in the distal right coronary artery with the wire without difficulty.  We initially predilated with a 2.25 x 25-mm Maverick performing two  inflations up to 8 atmospheres for 30 seconds.  We then deployed a 2.5 x 24-  mm Taxus stent deploying this with one inflation of 11 atmospheres for 30  seconds.  We then post dilated with a 2.5 x 20-mm Quantum Maverick  performing two inflations up to 16 atmospheres for 30 seconds.  Repeat  diagnostic studies were then performed through the guiding catheter.  The  right femoral artery was closed with Angio-Seal at the end of the procedure.  The patient tolerated the procedure well and left the laboratory in  satisfactory condition.  RESULTS:  Left main coronary artery:  The left main coronary was free of  significant disease.   Left anterior descending artery:  The left anterior descending artery gave  rise to a diagonal branch and two septal perforators.  The LAD had fairly  heavy calcification proximally and there was a 70-80% segmental stenosis  encompassing the first diagonal branch.  The first diagonal branch also had  an 80% ostial and proximal stenosis.   Circumflex artery:  The circumflex artery gave rise to a marginal branch and  two posterior lateral branches.  There was 70-80% narrowing in the marginal  branch.   Right coronary artery:  The right coronary artery was a moderately large  vessel that gave rise to two right ventricular branches then was completely  occluded in its distal portion. A posterior descending and posterior lateral  branch filled via collaterals from the left coronary artery.   LEFT VENTRICULOGRAM:  The left ventriculogram performed in the RAO  projection showed akinesis of the inferior wall in the very tip of the apex.  The anterior lateral wall and anterior wall near the base move well.  The  estimated ejection fraction was 40%.   Following stenting of the lesion in the distal  right coronary artery, the  stenosis improved from 100% to 0% and the flow improved from TIMI-0 to TIMI-  3 flow.  Distal vessel consists of a posterior descending and two large  posterior lateral branches which were free of major obstruction.   We will time the onset of the infarction at 7:30 on January 7 although the  exact timing is not at all clear.  In retrospect, his EKG at that time  probably showed hyperacute T-wave changes.  The balloon inflation occurred  at 0333 on January 8.  This gave a reperfusion time of 20 hours and 3  minutes and a door balloon time of 20 hours of 3 minutes.   IMPRESSION:  1. 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  2. Successful reperfusion and stenting of the right coronary artery with     improvement of stent re-narrowing from 100% to 0% and improvement in the     flow from TIMI-0 to TIMI-3 flow.   DISPOSITION:  The patient returned to the postangioplasty unit for further  observation. Will plan to treat the residual medical disease medically with  an ACE inhibitor when we are sure his renal function has stabilized.                                               Eustace Quail, M.D.    BB/MEDQ  D:  01/03/2004  T:  01/03/2004  Job:  [id number]   cc:   Darrick Penna. Linna Darner, M.D. [facility]   Dola Argyle, M.D.

## 2011-05-13 NOTE — Op Note (Signed)
[facility]  Patient:    Collin [name], Collin [name]                        MRN: [id number] Proc. Date: 02/28/01 Adm. Date:  [id number] Attending:  Kristine Royal CC:         Darrick Penna. Linna Darner, M.D. [facility]   Operative Report  PREOPERATIVE DIAGNOSES:  Probable left ureteral tumor with hydronephrosis, hematuria, and poor renal function.  POSTOPERATIVE DIAGNOSES:  Probable left ureteral tumor with hydronephrosis, hematuria, and poor renal function.  OPERATION:  Left nephroureterectomy.  ANESTHESIA:  General.  SURGEON:  Shanda Bumps., M.D.  ASSISTANT:  Lillette Boxer. Dahlstedt, M.D.  PROCEDURE:  The patient was placed on the operating table in the left flank position after satisfactory induction of general endotracheal anesthesia and insertion of a Foley catheter.  The kidney rest was elevated and he was in a left lateral position, more of a lyons position.  A subcostal incision was made just below the 12th rib anteriorly on the left carried down through the subcutaneous tissue to expose the fascia.  The three layers were then incised and the perineal cavity was then opened.  The left colon was reflected laterally up including the splenic flexure exposing the kidney hilum with the surrounding ______ fascia.  Brief dissection revealed the renal vein with the gonadal and adrenal branches clearly visible.  A #1 nylon suture was placed around this, but not tied.  The artery was very faint and seemed to be just underneath this.  I tried to get this before I tied the vein, but felt that it would be easier to go ahead and just tie the vein and then get the artery which was done.  The gonadal branch was then first tied as was the adrenal branch was clipped and the renal vein was doubly tied proximally and one distally and incised.  The renal artery was just below this.  This was ______ with a clamp and the artery was then tied off with a #1 silk suture.   Again, two ties were placed proximally and one distally and transected.  No other branches were seen.  The kidney was then freed up anteriorly leaving the adrenal on the left side.  Kidney was small and fairly atrophic.  The kidney was then completely freed up and the ureter was traced down to the pelvic brim with the cautery.  At this point we got as far as we could with this incision and placed a pack in the renal fossa which was dry.  The wound had been irrigated after the kidney was removed with sterile water.  Next, a Noberto Retort type incision was made on the left lower abdomen obliquely.  The muscle layers were then divided and the retroperitoneal space was then entered.  This was packed open and the ureter was identified as across the pelvic brim and the kidney was poked through this into the wound with traction on the kidney and the ureter the entire ureter was then traced down to the bladder.  It was fairly thick at the point just below the pelvic brim where the suspected tumor was, but no evidence of any spread locally.  When I got down to the hiatus of the bladder traction sutures with 2-0 Vicryl were then placed around the hiatus and the bladder then filled with saline and the cuff was then cut out and the entire ureter and kidney were sent as  a separate specimen.  Prior to doing this, though, I made a small ureterotomy in the renal pelvis and removed the stent so that that would not be in the way and placed a ligature suture just below the tumor so that there would not be any spillage when this was removed.  After the specimen had been removed, a small hole in the bladder was identified and under direct vision was closed with three layers with 2-0 Vicryl suture.  When this was done a JP drain was brought out through a separate stab incision and left in this area and the wound was again irrigated with water.  The wound was then closed in layers with first a lower incision with two  layers of #1 PDS, 2-0 Vicryl for the subcutaneous, and staples for the skin.  Injected 10 cc of Marcaine into the incision both in the lower and upper with 0.5% Marcaine plain.  The subcostal incision was also closed after reinspecting the kidney bed which was dry with two layers of #1 PDS and another interrupted 2-0 Vicryl for the subcutaneous and staples for the skin. Sterile dry dressings were applied.  Sponge, instrument, needle counts were correct.  The JP drain was then attached to drainage.  Estimated blood loss was 250 cc.  Patient tolerated this well, did not receive any transfusions, and went to recovery room in good condition. DD:  02/28/01 TD:  02/28/01 Job: [id number]

## 2011-05-13 NOTE — Op Note (Signed)
   [name], [name]                           ACCOUNT NO.:  [id number]   MEDICAL RECORD NO.:  [id number]                   PATIENT TYPE:  AMB   LOCATION:  [facility]                                 FACILITY:  [facility]   PHYSICIAN:  Shanda Bumps., M.D.      DATE OF BIRTH:  07/07/1925   DATE OF PROCEDURE:  04/30/2003  DATE OF DISCHARGE:                                 OPERATIVE REPORT   PREOPERATIVE DIAGNOSIS:  Recurrent bladder tumor, anterior bladder wall  (two), left posterior trigone.   POSTOPERATIVE DIAGNOSIS:  Recurrent bladder tumor, anterior bladder wall  (two), left posterior trigone.   PROCEDURE:  Transurethral resection of bladder tumor (x3), largest 1 cm,  anterior right wall and mid-trigone.   ANESTHESIA:  General.   SURGEON:  Corky Downs, M.D.   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  He had  recurrent bladder tumor 10/03, negative biopsy 1/04, but had recurrent  bladder tumors in the anterior bladder wall and possibly in the mid-trigone  and enters for resection of these lesions at this time.  He has had some  recent shortness of breath but stress test recently was negative.  No  history of MI.   DESCRIPTION OF PROCEDURE:  The patient was placed on the operating table in  the dorsal lithotomy position, after satisfactory induction of general  anesthesia was prepped and draped with Betadine in the usual sterile  fashion.  A #2 panendoscope was inserted, no anterior urethral stricture  seen, mild bilobar obstruction of the prostate, and the bladder was entered.  In the mid-trigone there was an area of inflammation that looks like some  early papular growth, but on the right anterior wall there were two definite  tumors, the largest of which was about a centimeter across.  These were  photographed and then resected using the cold cup biopsy forceps  and the  base fulgurated with the Bugbee electrode.  This effected good hemostasis.  The bladder was drained, scope removed, and a B&O suppository inserted.  He  was taken to the recovery room in good condition and will be later  discharged as an out patient.                                               Shanda Bumps., M.D.    HMK/MEDQ  D:  04/30/2003  T:  04/30/2003  Job:  [id number]

## 2011-05-13 NOTE — Assessment & Plan Note (Signed)
[facility]                              CARDIOLOGY OFFICE NOTE   KHRISTOPHER, GONNERMAN                        MRN:          [id number]  DATE:08/22/2006                            DOB:          1925/12/21    Mr. Galluccio is seen for cardiology followup, he is doing well.  I had seen him  on 05/10 and then 05/18/06.  Had been watching his chest pain syndrome.  It  is actually stable.  It has now been several months and he is not having a  lot of symptoms.  He does have known coronary disease.  He is post non-STEMI  in January 2005 with a Taxus stent to the right and residual disease of his  LAD diagonal and first OM.   PAST MEDICAL HISTORY:   ALLERGIES:  No known drug allergies.   MEDICATIONS:  1. Clopidigril 75 mg.  2. Mavik 1 mg.  3. Aspirin.  4. Uroxatral.  5. Fish oil.  6. Multivitamin.  7. Lipitor.  8. Nexium.   OTHER MEDICAL PROBLEMS:  See the list below.   REVIEW OF SYSTEMS:  He is feeling well and is not having any significant  symptoms.   PHYSICAL EXAMINATION:  Blood pressure 128/74 with a pulse of 64.  The  patient is oriented to person, time and place and affect is normal.  LUNGS:  Clear.  Respiratory effort is not labored.  HEENT:  Reveals no xanthelasma.  He has normal extraocular motion.  There  are no carotid bruits.  There is no jugulovenous distention.  CARDIAC:  Reveals S1/S2.  There are no clicks or significant murmurs.  ABDOMEN:  Soft.  There are no masses or bruits.  He has no peripheral edema.   No labs are done.   PROBLEMS:  1. Coronary disease with non-STEMI in January 2005 with a Taxus stent.  2. Residual coronary disease to the LAD diagonal and first OM.  3. Ejection fraction that has varied over time from 40-60%.  Most recently      it was 52% with his most recent Myoview.  4. Abnormal chest CT with pulmonary nodules followed by Dr. Linna Darner.  5. Question of elevated glucose.  6. Normocytic anemia in the past.  7. Hypertension treated.  8. History of bladder cancer treated.  9. Hyperlipidemia.  10.GERD.  11.History of left nephrectomy.  12.History of old cigarette use.  13.Some discomfort in his left toe which is not an ongoing problem.   Patient is stable.  He does not need any further adjustments in his meds at  this time.  He will go about usual activities.  I will see him back in six  months.                               Carlena Bjornstad, MD, [facility]    JDK/MedQ  DD:  08/22/2006  DT:  08/23/2006  Job #:  [id number]   cc:   Darrick Penna. Linna Darner, MD, [place], [facility]

## 2011-05-13 NOTE — H&P (Signed)
[facility]  Patient:    Collin [name], Collin [name]                        MRN: [id number] Adm. Date:  [id number] Attending:  Kristine Royal CC:         Darrick Penna. Linna Darner, M.D. 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  Presented late January with total gross painless hematuria.  Workup included a CT which showed left hydronephrosis, very poorly functioning left kidney down to the narrowed area just below the pelvic brim.  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  I was able to get a stent passed the obstruction but I could not get the ureteroscope.  He had a lot of bloody urine in his kidney with passage of the stent.  Creatinine 1.9, BUN 24, hematocrit 38.  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  MEDICATIONS:  None.  ALLERGIES:  None.  PAST SURGICAL HISTORY:  HNP [id number] without complications.  REVIEW OF SYSTEMS:  Nonsmoker.  Good general health.  Weight has been stable. No cardiac or pulmonary symptomatology.  Unchanged bowel habits.  No history of hiatal hernia or peptic ulcer disease.  No arthritis to speak of.  FAMILY HISTORY:  No history of cancer, diabetes, or heart disease.  No kidney problems.  SOCIAL HISTORY:  He is married.  One son, 11, lives in [place].  Retired from [employer] after 22 years and is married happily.  PHYSICAL EXAMINATION  VITAL SIGNS:  Weight 139 pounds, blood pressure 117/78, pulse 71.  He is afebrile.  GENERAL:  He is a pleasant, quiet white male, white hair.  No acute distress.  HEENT:  Clear.  NECK:  Supple without bruits.  HEART:  Normal without murmurs, gallops, or rubs.  CHEST:  Clear to auscultation and  percussion.  ABDOMEN:  Soft, benign without masses or organomegaly.  No tenderness.  No CVA tenderness.  GENITOURINARY:  A 35 g benign prostate.  Normal scrotum.  Normal circumcised penis.  Bilaterally descended testes.  EXTREMITIES:  No edema.  Good distal pulses.  IMPRESSION: 1. 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 2. Hematuria. 3. Left hydronephrosis with poorly functioning left kidney.  Recommend left    nephroureterectomy as explained. DD:  02/28/01 TD:  02/28/01 Job: [id number]

## 2011-05-13 NOTE — Assessment & Plan Note (Signed)
[place]                            CARDIOLOGY OFFICE NOTE   DELBERT, TESTANI                        MRN:          [id number]  DATE:02/05/2007                            DOB:          07-28-1925    I had seen Mr. Lesar last in August of 2007.  He was hospitalized in  January of 2008 with some chest discomfort.  Decision was made for him  to have an outpatient Myoview scan.  He exercised well.  He had evidence  of an old inferior MI with some periinfarct ischemia, but no other  significant ischemia, and his exercise tolerance was good.  I felt that  he did not need any further workup based on this.  Since that time he  has done well.  He has not had any chest pain.  He has some mild  shortness of breath if he over-exerts.   PAST MEDICAL HISTORY:  ALLERGIES:  NO KNOWN DRUG ALLERGIES.   MEDICATIONS:  1. Mavik.  2. Aspirin.  3. Uroxatral.  4. Multivitamin.  5. Lipitor 20.  6. Nexium 20.  7. Imdur 30.  8. Plavix 75.   OTHER MEDICAL PROBLEMS:  See the list below.   REVIEW OF SYSTEMS:  Other than some slight exertional shortness of  breath, his review of systems is negative.   PHYSICAL EXAM:  Weight is 163 pounds.  Blood pressure 102/68 with a  pulse of 65.  The patient is oriented to person, time and place.  Affect  is normal.  LUNGS:  Clear.  RESPIRATORY EFFORT:  Not labored.  CARDIAC:  An S1 with an S2.  There are no clicks or significant murmurs.  ABDOMEN:  Soft.  He has no masses or bruits.  There is no peripheral  edema.   EKG reveals his old inferior infarct and normal sinus rhythm.   PROBLEMS:  1. Coronary disease with a non-ST elevated myocardial infarction in      January of 2005 with a TAXUS stent.  2. Residual coronary disease of the left anterior descending and      obtuse marginal but a recent Myoview on January 22, 2007, showing      no marked ischemia.  3. Ejection fraction that is 55% by his most recent nuclear scan.  4. History of abnormal chest CT with pulmonary nodules that have been      followed by Dr. Linna Darner over time.  5. Normocytic anemia in the past.  6. Hypertension, treated.  7. History of bladder cancer, treated.  8. Hyperlipidemia, treated.  9. Gastroesophageal reflux disease, treated.  10.History of left nephrectomy.  11.Old cigarette use.  12.Creatinine in the 1.6 range.  This is stable for him and can be      followed over time.  13.Some decrease in his platelet count.  We will recheck CBC with      platelets to be sure that this is stable, as he is on aspirin and      Plavix.     Carlena Bjornstad, MD, [facility]  Electronically Signed    JDK/MedQ  DD: 02/05/2007  DT: 02/05/2007  Job #: [id number]

## 2011-05-24 ENCOUNTER — Other Ambulatory Visit: Payer: Self-pay | Admitting: Cardiology

## 2011-08-06 ENCOUNTER — Other Ambulatory Visit: Payer: Self-pay | Admitting: Cardiology

## 2011-08-08 ENCOUNTER — Inpatient Hospital Stay (INDEPENDENT_AMBULATORY_CARE_PROVIDER_SITE_OTHER)
Admission: RE | Admit: 2011-08-08 | Discharge: 2011-08-08 | Disposition: A | Discharge status: Home / Self Care | Payer: Medicare Other | Source: Ambulatory Visit | Attending: Family Medicine | Admitting: Family Medicine

## 2011-08-08 DIAGNOSIS — S838X9A Sprain of other specified parts of unspecified knee, initial encounter: Secondary | ICD-10-CM

## 2011-08-16 ENCOUNTER — Encounter (HOSPITAL_COMMUNITY): Payer: Medicare Other

## 2011-08-16 ENCOUNTER — Ambulatory Visit (HOSPITAL_COMMUNITY): Payer: Medicare Other

## 2011-08-16 ENCOUNTER — Telehealth: Payer: Self-pay | Admitting: Internal Medicine

## 2011-08-16 ENCOUNTER — Encounter: Payer: Self-pay | Admitting: Internal Medicine

## 2011-08-16 ENCOUNTER — Ambulatory Visit (INDEPENDENT_AMBULATORY_CARE_PROVIDER_SITE_OTHER): Payer: Medicare Other | Admitting: Internal Medicine

## 2011-08-16 DIAGNOSIS — M7989 Other specified soft tissue disorders: Secondary | ICD-10-CM

## 2011-08-16 DIAGNOSIS — R609 Edema, unspecified: Secondary | ICD-10-CM

## 2011-08-16 DIAGNOSIS — M79609 Pain in unspecified limb: Secondary | ICD-10-CM

## 2011-08-16 DIAGNOSIS — M79669 Pain in unspecified lower leg: Secondary | ICD-10-CM

## 2011-08-16 NOTE — Progress Notes (Signed)
  Subjective:    Patient ID: Collin [name], male    DOB: [birth year], 77 y.o.   MRN: [id number]  HPI Extremity pain Location: L calf Onset:8/12 Trigger/injury:lifting his wife he heard a loud pop Pain quality:aching Pain severity: up to 5 Duration:constant, worse walking Radiation:no Exacerbating factors:walking Treatment/response:ice/ temporarily Review of systems: Constitutional: no fever, chills, sweats, change in weight  Musculoskeletal:no   joint stiffness, redness, or swelling Skin:no rash, color change Neuro: no :weakness; incontinence (stool/urine); numbness and tingling Heme:no lymphadenopathy; abnormal bruising or bleeding   Edema of the left foot began 8/14. He denies chest pain or shortness of breath Significantly he is on Plavix ; PMH of CAD with  stenting      Review of Systems      Objective:   Physical Exam he is in no acute distress.  Chest is clear without rales, rhonchi, or wheezes  He exhibits slow regular rhythm with an S4  1+ edema is noted over the left ankle and foot. Ecchymosis is present circumferentially about the left foot.  Pedal pulses are intact.  The Achilles tendon is nontender and appears intact. He is tender over the gastrocnemius  particularly with rising up on his heels. There is no asymmetry of the gastrocnemius except for edema in the  left calf. Homans sign is negative.        Assessment & Plan:   #1 calf pain probably from muscle tear in context of Plavix  therapy.  Plan: Ultrasound of the calf to rule out deep venous thrombosis.

## 2011-08-16 NOTE — Patient Instructions (Signed)
Use warm moist compresses 3 times a day to the calf. Please call if you change your  mind about a prescription for pain medicine.

## 2011-08-16 NOTE — Telephone Encounter (Signed)
FYI, patient was scheduled with [facility] for today at 3pm to rule out DVT.  When I called to inform patient of appointment, he stated he can only do mornings due to no one available to stay with his wife.  I explained to patient that these are normally performed same day in case of DVT, but patient has rescheduled to tomorrow, 08-17-11 at 9:00am.

## 2011-08-16 NOTE — Telephone Encounter (Signed)
Noted, will forward to Dr.Hopper as Juluis Rainier

## 2011-08-17 ENCOUNTER — Telehealth: Payer: Self-pay

## 2011-08-17 ENCOUNTER — Ambulatory Visit (HOSPITAL_COMMUNITY)
Admission: RE | Admit: 2011-08-17 | Discharge: 2011-08-17 | Disposition: A | Discharge status: Home / Self Care | Payer: Medicare Other | Source: Ambulatory Visit | Attending: Internal Medicine | Admitting: Internal Medicine

## 2011-08-17 DIAGNOSIS — M7989 Other specified soft tissue disorders: Secondary | ICD-10-CM | POA: Insufficient documentation

## 2011-08-17 DIAGNOSIS — M79609 Pain in unspecified limb: Secondary | ICD-10-CM | POA: Insufficient documentation

## 2011-08-17 NOTE — Telephone Encounter (Signed)
No DVT, patient with fluid collection on left calf (area of pain).  Patient was still at facility at the time of call report, Per Dr.Hopper, inform patient of results, have patient continue with instruction-warm compresses and elevation of leg, If no better next step MRI. I spoke with patient, patient ok'd all information

## 2011-09-14 LAB — BASIC METABOLIC PANEL
BUN: 17
BUN: 21
BUN: 22
Calcium: 8.9
Chloride: 102
Chloride: 106
Creatinine, Ser: 1.46
Creatinine, Ser: 1.54 — ABNORMAL HIGH
GFR calc Af Amer: 56 — ABNORMAL LOW
GFR calc non Af Amer: 44 — ABNORMAL LOW
GFR calc non Af Amer: 44 — ABNORMAL LOW
Glucose, Bld: 148 — ABNORMAL HIGH
Glucose, Bld: 98
Potassium: 4.2

## 2011-09-14 LAB — CBC
HCT: 41.3
HCT: 42.2
HCT: 42.4
HCT: 43.9
HCT: 45.5
Hemoglobin: 14
Hemoglobin: 14.3
Hemoglobin: 15.3
Hemoglobin: 15.8
MCHC: 34.2
MCHC: 34.6
MCHC: 34.9
MCV: 91.4
MCV: 91.8
MCV: 92.1
Platelets: 125 — ABNORMAL LOW
Platelets: 129 — ABNORMAL LOW
Platelets: 131 — ABNORMAL LOW
Platelets: 133 — ABNORMAL LOW
RBC: 4.46
RBC: 4.61
RDW: 13
RDW: 13.1
RDW: 13.6
WBC: 3.6 — ABNORMAL LOW
WBC: 3.8 — ABNORMAL LOW
WBC: 3.9 — ABNORMAL LOW
WBC: 5.1

## 2011-09-14 LAB — DIFFERENTIAL
Basophils Relative: 1
Eosinophils Absolute: 0.2
Eosinophils Relative: 4
Lymphocytes Relative: 28
Lymphocytes Relative: 33
Lymphs Abs: 1.1
Lymphs Abs: 1.2
Monocytes Absolute: 0.4
Monocytes Absolute: 0.5
Monocytes Relative: 11
Monocytes Relative: 13 — ABNORMAL HIGH
Neutro Abs: 2.2
Neutrophils Relative %: 57

## 2011-09-14 LAB — LIPID PANEL
HDL: 32 — ABNORMAL LOW
LDL Cholesterol: 66
Triglycerides: 255 — ABNORMAL HIGH

## 2011-09-14 LAB — CARDIAC PANEL(CRET KIN+CKTOT+MB+TROPI)
Relative Index: 2.1
Relative Index: 2.2
Total CK: 427 — ABNORMAL HIGH
Troponin I: 0.01
Troponin I: 0.02

## 2011-09-14 LAB — I-STAT 8, (EC8 V) (CONVERTED LAB)
BUN: 24 — ABNORMAL HIGH
Bicarbonate: 22.5
Glucose, Bld: 96
Hemoglobin: 14.3
Sodium: 134 — ABNORMAL LOW
pH, Ven: 7.522 — ABNORMAL HIGH

## 2011-09-14 LAB — POCT CARDIAC MARKERS
CKMB, poc: 4.2
Myoglobin, poc: 471

## 2011-09-14 LAB — COMPREHENSIVE METABOLIC PANEL
AST: 33
Albumin: 4.2
BUN: 21
CO2: 27
Calcium: 9.1
Creatinine, Ser: 1.62 — ABNORMAL HIGH
GFR calc Af Amer: 50 — ABNORMAL LOW
GFR calc non Af Amer: 41 — ABNORMAL LOW

## 2011-09-14 LAB — PROTIME-INR
INR: 0.9
INR: 1.1

## 2011-09-14 LAB — APTT
aPTT: 21 — ABNORMAL LOW
aPTT: 41 — ABNORMAL HIGH

## 2011-09-14 LAB — POCT I-STAT CREATININE: Creatinine, Ser: 1.8 — ABNORMAL HIGH

## 2011-09-14 LAB — D-DIMER, QUANTITATIVE: D-Dimer, Quant: 2.45 — ABNORMAL HIGH

## 2011-10-30 ENCOUNTER — Other Ambulatory Visit: Payer: Self-pay | Admitting: Cardiology

## 2011-11-04 ENCOUNTER — Other Ambulatory Visit: Payer: Self-pay | Admitting: Cardiology

## 2011-12-08 ENCOUNTER — Other Ambulatory Visit: Payer: Self-pay | Admitting: Internal Medicine

## 2012-01-02 DIAGNOSIS — R3915 Urgency of urination: Secondary | ICD-10-CM | POA: Diagnosis not present

## 2012-01-02 DIAGNOSIS — N400 Enlarged prostate without lower urinary tract symptoms: Secondary | ICD-10-CM | POA: Diagnosis not present

## 2012-01-04 DIAGNOSIS — H18519 Endothelial corneal dystrophy, unspecified eye: Secondary | ICD-10-CM | POA: Insufficient documentation

## 2012-01-12 ENCOUNTER — Encounter: Payer: Self-pay | Admitting: *Deleted

## 2012-01-15 ENCOUNTER — Other Ambulatory Visit: Payer: Self-pay | Admitting: Cardiology

## 2012-01-26 ENCOUNTER — Encounter: Payer: Self-pay | Admitting: Cardiology

## 2012-01-26 DIAGNOSIS — R9430 Abnormal result of cardiovascular function study, unspecified: Secondary | ICD-10-CM | POA: Insufficient documentation

## 2012-01-26 DIAGNOSIS — I739 Peripheral vascular disease, unspecified: Secondary | ICD-10-CM

## 2012-01-26 DIAGNOSIS — I2510 Atherosclerotic heart disease of native coronary artery without angina pectoris: Secondary | ICD-10-CM | POA: Insufficient documentation

## 2012-01-26 DIAGNOSIS — IMO0002 Reserved for concepts with insufficient information to code with codable children: Secondary | ICD-10-CM | POA: Insufficient documentation

## 2012-01-28 ENCOUNTER — Other Ambulatory Visit: Payer: Self-pay | Admitting: Cardiology

## 2012-01-30 ENCOUNTER — Ambulatory Visit (INDEPENDENT_AMBULATORY_CARE_PROVIDER_SITE_OTHER): Payer: Medicare Other | Admitting: Cardiology

## 2012-01-30 ENCOUNTER — Encounter: Payer: Self-pay | Admitting: Cardiology

## 2012-01-30 VITALS — BP 126/74 | HR 68 | Ht 68.0 in | Wt 167.0 lb

## 2012-01-30 DIAGNOSIS — I10 Essential (primary) hypertension: Secondary | ICD-10-CM

## 2012-01-30 DIAGNOSIS — I2510 Atherosclerotic heart disease of native coronary artery without angina pectoris: Secondary | ICD-10-CM | POA: Diagnosis not present

## 2012-01-30 DIAGNOSIS — I779 Disorder of arteries and arterioles, unspecified: Secondary | ICD-10-CM | POA: Diagnosis not present

## 2012-01-30 NOTE — Patient Instructions (Addendum)
Your physician wants you to follow-up in: 24 months.   You will receive a reminder letter in the mail two months in advance. If you don't receive a letter, please call our office to schedule the follow-up appointment.

## 2012-01-30 NOTE — Assessment & Plan Note (Signed)
Coronary disease is stable. He does not need any further testing at this time. 

## 2012-01-30 NOTE — Assessment & Plan Note (Signed)
Blood pressure stable. No change in therapy.

## 2012-01-30 NOTE — Assessment & Plan Note (Signed)
The patient had a carotid Doppler in 2010 revealing no significant carotid artery disease. He does not need a repeat study.

## 2012-01-30 NOTE — Progress Notes (Signed)
HPI Patient is seen today for followup coronary disease. He had a coronary intervention in 2005. In 200 9 repeat catheterization was done. There is no significant change and no intervention was needed. There was some overall diffuse progression of atherosclerosis. His ejection fraction was 55%.  I saw him last in September, 2010. He has been stable. He is caring for his wife with Alzheimer's for the past 4 or 5 years. This decreases his ability to sleep at night but otherwise he continues to manage well. He is a very young looking 78 years of age.  As part of today's evaluation I have reviewed the old cardiology records on the patient. I have updated completely the new electronic medical record. Allergies  Allergen Reactions  . Trandolapril     cough    Current Outpatient Prescriptions  Medication Sig Dispense Refill  . alfuzosin (UROXATRAL) 10 MG 24 hr tablet Take 1 tablet by mouth daily.      Marland Kitchen aspirin 81 MG tablet Take 81 mg by mouth daily.        Marland Kitchen atorvastatin (LIPITOR) 20 MG tablet TAKE 1 TABLET DAILY (APPOINTMENT IS NEEDED FOR FURTHER REFILLS)  90 tablet  4  . clopidogrel (PLAVIX) 75 MG tablet TAKE 1 TABLET DAILY  90 tablet  0  . dutasteride (AVODART) 0.5 MG capsule Take 0.5 mg by mouth daily.        . Multiple Vitamin (MULTIVITAMIN) tablet Take 1 tablet by mouth daily.        Marland Kitchen NEXIUM 40 MG capsule TAKE 1 CAPSULE DAILY  90 capsule  1  . valsartan (DIOVAN) 80 MG tablet Take 80 mg by mouth daily.          History   Social History  . Marital Status: Married    Spouse Name: N/A    Number of Children: N/A  . Years of Education: N/A   Occupational History  . 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 Smoking status: Former Research scientist (life sciences)  . Smokeless tobacco: Not on file   Comment: Quit 20-30 years ago as of 2012  . Alcohol Use: No  . Drug Use: No  . Sexually Active: Not on file   Other Topics Concern  . Not on file   Social History Narrative  . No narrative  on file    Family History  Problem Relation Age of Onset  . Heart failure Mother 65  . Coronary artery disease Brother     Past Medical History  Diagnosis Date  . CAD (coronary artery disease) 2005    DES January 2 005, residual disease  / Myoview  . Anemia   . GERD (gastroesophageal reflux disease)   . COPD (chronic obstructive pulmonary disease)   . HTN (hypertension)   . Hyperlipidemia   . Low back pain syndrome   . Encounter for long-term (current) use of other medications   . Dysfunction of eustachian tube   . Reactive airway disease   . Carcinoma, renal cell   . Thrombocytopenia   . Bladder cancer   . Hx of skin cancer, basal cell   . Ejection fraction     EF 55%, echo, January, 2009  . Pulmonary nodule     Chest CT, followed  . Carotid artery disease     Doppler, September, 2010, normal, no carotid artery disease    Past Surgical History  Procedure Date  . Osteomyelitis     staph aureus  .  Nephrectomy 2003    left  . Bladder tumors 2003  . Cholecystectomy 2004  . Ureterectomy 2003    for cancer    ROS  Patient denies fever, chills, headache, sweats, rash, change in vision, change in hearing, chest pain, cough, nausea vomiting, urinary symptoms. All of the systems are reviewed and are negative.  PHYSICAL EXAM Patient is oriented to person time and place. Affect is normal. There is no jugulovenous distention. Lungs are clear. Respiratory effort is nonlabored. Cardiac exam reveals S1 and S2. There no clicks or significant murmurs. Abdomen is soft. Is no peripheral edema. There no musculoskeletal deformities. There are no skin rashes.  Filed Vitals:   01/30/12 1054  BP: 126/74  Pulse: 68  Height: 5\' 8"  (1.727 m)  Weight: 167 lb (75.751 kg)    EKG EKG is done today and reviewed by me. There is sinus rhythm. There is evidence of an old inferior infarct. There is no change since the prior EKG that I reviewed from the past.  ASSESSMENT & PLAN

## 2012-02-17 DIAGNOSIS — L719 Rosacea, unspecified: Secondary | ICD-10-CM | POA: Diagnosis not present

## 2012-02-17 DIAGNOSIS — D485 Neoplasm of uncertain behavior of skin: Secondary | ICD-10-CM | POA: Diagnosis not present

## 2012-02-17 DIAGNOSIS — L089 Local infection of the skin and subcutaneous tissue, unspecified: Secondary | ICD-10-CM | POA: Diagnosis not present

## 2012-02-17 DIAGNOSIS — L723 Sebaceous cyst: Secondary | ICD-10-CM | POA: Diagnosis not present

## 2012-03-07 ENCOUNTER — Ambulatory Visit (INDEPENDENT_AMBULATORY_CARE_PROVIDER_SITE_OTHER)
Admission: RE | Admit: 2012-03-07 | Discharge: 2012-03-07 | Disposition: A | Discharge status: Home / Self Care | Payer: Medicare Other | Source: Ambulatory Visit | Attending: Internal Medicine | Admitting: Internal Medicine

## 2012-03-07 ENCOUNTER — Encounter: Payer: Self-pay | Admitting: Internal Medicine

## 2012-03-07 ENCOUNTER — Ambulatory Visit (INDEPENDENT_AMBULATORY_CARE_PROVIDER_SITE_OTHER): Payer: Medicare Other | Admitting: Internal Medicine

## 2012-03-07 VITALS — BP 136/80 | HR 61 | Temp 97.8°F | Wt 169.0 lb

## 2012-03-07 DIAGNOSIS — M48062 Spinal stenosis, lumbar region with neurogenic claudication: Secondary | ICD-10-CM

## 2012-03-07 DIAGNOSIS — L408 Other psoriasis: Secondary | ICD-10-CM | POA: Diagnosis not present

## 2012-03-07 DIAGNOSIS — M47817 Spondylosis without myelopathy or radiculopathy, lumbosacral region: Secondary | ICD-10-CM | POA: Diagnosis not present

## 2012-03-07 DIAGNOSIS — R040 Epistaxis: Secondary | ICD-10-CM | POA: Diagnosis not present

## 2012-03-07 DIAGNOSIS — L409 Psoriasis, unspecified: Secondary | ICD-10-CM

## 2012-03-07 DIAGNOSIS — M5137 Other intervertebral disc degeneration, lumbosacral region: Secondary | ICD-10-CM | POA: Diagnosis not present

## 2012-03-07 MED ORDER — GABAPENTIN 100 MG PO CAPS: 100.0000 mg | ORAL_CAPSULE | Freq: Three times a day (TID) | ORAL | Status: DC

## 2012-03-07 NOTE — Progress Notes (Signed)
Subjective:    Patient ID: Collin [name], male    DOB: [birth year], 78 y.o.   MRN: [id number]  HPI CC: both legs below the knee and calves 2 months ago   HPI: Onset: 2 weeks ago hurting above the knee - radiates to hips Trigger/injury:no Pain quality (ex: sharp, dull, aching, etc.): aching constant, worse with walking Pain severity: worst 5/10, right now pain is 3/10 Duration: constant Exacerbating factors: walking, applying pressure on feet (weight bearing), lifting Relieving factors/Treatment and Response: tried tylenol at night - dulls pain; better sitting   Medications: on plavix - bruises easy due to med ? ROS: General (ex: fatigue, fever, muscle aches, chills, sweats etc.): no Lymph:no Musculoskeletal (ex: muscle cramps/pain, joint swelling/redness/stiffness): lower back problems for a few years Neuro: (ex: memory loss, weakness, incontinence, numbness/tingling): legs weaker than normal, no numbness or tingling. He denies any incontinence of urine or stool.   Skin, hair, or nail changes/Rash: no    never had this pain before , but he has a past medical history of "bone spur" removal from the lower spine by neurosurgeon   "feels like a muscle strain"       Review of Systems   he denies any other bleeding dyscrasias such as hemoptysis, rectal bleeding, melena, or difficulty stopping bleeding when it occurs.  Using a humidifier has resolved the epistaxis for the most part     Objective:   Physical Exam Gen.: Healthy and well-nourished in appearance. Alert, appropriate and cooperative throughout exam. Head: Normocephalic without obvious abnormalities  Hearing is grossly normal bilaterally. Nose: External nasal exam reveals no deformity or inflammation. Nasal mucosa are dry & erythematous w/o bleeding. No lesions or exudates noted.   Neck: No deformities, masses, or tenderness noted. Range of motion decreased laterally Lungs: Normal respiratory effort; chest expands  symmetrically. Lungs are clear to auscultation without rales, wheezes, or increased work of breathing.  Abdomen: Bowel sounds normal; abdomen soft and nontender. No masses, organomegaly or hernias noted.No AAA                                                              Musculoskeletal/extremities: No deformity or scoliosis noted of  the thoracic or lumbar spine; op scar LS spine. No clubbing, cyanosis, edema, or deformity noted. Range of motion  normal .Tone & strength  normal.Joints normal. Chronic toe nail changes Vascular:  dorsalis pedis and  posterior tibial pulses are full and equal.  Neurologic: Alert and oriented x3. Deep tendon reflexes symmetrical and normal. Gait is normal including tiptoe and heel walking. He has discomfort in the right posterior thigh with straight leg raising; he is able to lie back and sit up without help         Skin: There are scattered keratotic lesions as well as psoriatic lesions  Lymph: No cervical, axillary lymphadenopathy present. Psych: Mood and affect are normal. Normally interactive  Assessment & Plan:  #1 leg discomfort which is progressive; history and exam suggest probable spinal stenosis. 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 This includes neurogenic claudication  #2 psoriatic lesions; doubt psoriatic spine disease  #3 epistaxis without evidence of any bleeding dyscrasia. This is most likely related to environmental drying of nasal mucosa.  Plan: #1 plain films of the lumbosacral spine to rule out psoriatic arthritis  #2 trial of gabapentin  #3 MRI if symptoms persist or progress with #2  #4 moisturizing agents twice a day. He should get a humidifier every day to prevent growth of fungus.

## 2012-03-07 NOTE — Patient Instructions (Signed)
Use Eucerin  agent  twice a day  for the drying. Empty the humidifier daily.

## 2012-03-19 ENCOUNTER — Other Ambulatory Visit: Payer: Self-pay | Admitting: Internal Medicine

## 2012-03-19 ENCOUNTER — Telehealth: Payer: Self-pay

## 2012-03-19 DIAGNOSIS — M48062 Spinal stenosis, lumbar region with neurogenic claudication: Secondary | ICD-10-CM

## 2012-03-19 NOTE — Telephone Encounter (Addendum)
Spoke with patient, patient states he would like to consider MRI at this time, symptoms ongoing. Patient prefers mornings

## 2012-03-19 NOTE — Telephone Encounter (Signed)
Message copied by Logan Bores on Mon Mar 19, 2012  6:17 PM ------      Message from: Hendricks Limes      Created: Sun Mar 11, 2012  9:58 AM       Bone spurs due to osteoarthritis present. MRI can be considered if symptoms persist or progress. [employer]

## 2012-03-22 NOTE — Telephone Encounter (Signed)
Order placed

## 2012-03-23 ENCOUNTER — Ambulatory Visit (HOSPITAL_COMMUNITY)
Admission: RE | Admit: 2012-03-23 | Discharge: 2012-03-23 | Disposition: A | Discharge status: Home / Self Care | Payer: Medicare Other | Source: Ambulatory Visit | Attending: Internal Medicine | Admitting: Internal Medicine

## 2012-03-23 ENCOUNTER — Other Ambulatory Visit: Payer: Self-pay | Admitting: Cardiology

## 2012-03-23 DIAGNOSIS — M48061 Spinal stenosis, lumbar region without neurogenic claudication: Secondary | ICD-10-CM | POA: Diagnosis not present

## 2012-03-23 DIAGNOSIS — M48062 Spinal stenosis, lumbar region with neurogenic claudication: Secondary | ICD-10-CM

## 2012-03-30 ENCOUNTER — Telehealth: Payer: Self-pay | Admitting: Internal Medicine

## 2012-03-30 NOTE — Telephone Encounter (Signed)
Discussed MRI results with patient, patient would like to consider Back specialist, patient does not have a preference.  Dr.Hopper please advise

## 2012-03-30 NOTE — Telephone Encounter (Signed)
Please call back with MRI Results ph# [phone]

## 2012-03-31 ENCOUNTER — Other Ambulatory Visit: Payer: Self-pay | Admitting: Internal Medicine

## 2012-03-31 DIAGNOSIS — M4807 Spinal stenosis, lumbosacral region: Secondary | ICD-10-CM

## 2012-04-27 ENCOUNTER — Telehealth: Payer: Self-pay | Admitting: Internal Medicine

## 2012-04-27 NOTE — Telephone Encounter (Signed)
Per Dr.Hopper pink eye can be life threatening to an adult and patient needs OV. Patient can see Doctor of the day

## 2012-04-27 NOTE — Telephone Encounter (Signed)
Patient states his wife was in for [place] last week and now he has it. He wanted to know if Dr.Hopper could call him in something for it? Patient uses Oncologist at [facility] Patient ph# [phone]

## 2012-04-27 NOTE — Telephone Encounter (Signed)
Coming in Monday at 11am  Thanks

## 2012-04-27 NOTE — Telephone Encounter (Signed)
Is patient not able to come in today or Saturday clinic (9-1)

## 2012-04-27 NOTE — Telephone Encounter (Signed)
No ride for afternoon appointments. I can call him and see if he can do Saturday clinic

## 2012-04-30 ENCOUNTER — Ambulatory Visit (INDEPENDENT_AMBULATORY_CARE_PROVIDER_SITE_OTHER): Payer: Medicare Other | Admitting: Internal Medicine

## 2012-04-30 ENCOUNTER — Encounter: Payer: Self-pay | Admitting: Internal Medicine

## 2012-04-30 VITALS — BP 120/78 | HR 62 | Temp 97.8°F | Wt 171.2 lb

## 2012-04-30 DIAGNOSIS — H5711 Ocular pain, right eye: Secondary | ICD-10-CM

## 2012-04-30 DIAGNOSIS — H5710 Ocular pain, unspecified eye: Secondary | ICD-10-CM

## 2012-04-30 NOTE — Progress Notes (Signed)
  Subjective:    Patient ID: Collin [name], male    DOB: 1925-12-24, 78 y.o.   MRN: [id number]  HPI As of 04/27/12 he was told that his right eye was red. It feels as if there is a "eyelash" in the. He's had intermittent dryness since 5/1. He has not treated this in any fashion.  His wife recently had "pinkeye"  He denies fever, chills, sweats, or purulence from the eye. He's had no blurred vision, double vision, or vision loss. Eyes have been watering Past medical history/family history/social history were all reviewed and updated. 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 He questions whether the sutures may have broken. He's had to have suture fragments removed in the past.     Review of Systems he has not had frontal headache, facial pain, nasal purulence, sore throat, or earache with discharge. He denies epistaxis, hemoptysis, melena, rectal bleeding, or abnormal bruising or bleeding. Intermittently he has noted  some blood in the semen; he has told the Urology PA this     Objective:   Physical Exam General appearance:good health ;well nourished; no acute distress or increased work of breathing is present.  No  lymphadenopathy about the head, neck, or axilla noted.   Eyes: No conjunctival inflammation or lid edema is present. There is no scleral icterus; but there is minor scleral hemorrhage on the right. Extraocular motion is intact. Vision is normal with lenses  Ears:  External ear exam shows no significant lesions or deformities.  Otoscopic examination reveals clear canals, tympanic membranes are intact bilaterally without bulging, retraction, inflammation or discharge.  Nose:  External nasal examination shows no deformity or inflammation. Nasal mucosa are pink and moist without lesions or exudates. No septal dislocation or deviation.No obstruction to airflow.   Oral exam: Dental hygiene is good; lips and gums are healthy appearing.There is no  oropharyngeal erythema or exudate noted.   Neck:  No deformities, thyromegaly, masses, or tenderness noted.            Assessment & Plan:  #1 minor scleral bleeding in the context of past history of corneal transplant with subsequent suture rupture. Clinically he does not have conjunctivitis.Marland Kitchen He has no history of bleeding dyscrasias and blood pressure is normal  Plan :Ophthalmology consultation.

## 2012-04-30 NOTE — Patient Instructions (Signed)
Please use natural tears 3-4 times a day to cleanse the affected eye. Strict hand hygiene before and after using the natural tears as discussed

## 2012-05-01 DIAGNOSIS — H35379 Puckering of macula, unspecified eye: Secondary | ICD-10-CM | POA: Diagnosis not present

## 2012-05-01 DIAGNOSIS — H169 Unspecified keratitis: Secondary | ICD-10-CM | POA: Diagnosis not present

## 2012-05-01 DIAGNOSIS — H04129 Dry eye syndrome of unspecified lacrimal gland: Secondary | ICD-10-CM | POA: Diagnosis not present

## 2012-05-01 DIAGNOSIS — H35369 Drusen (degenerative) of macula, unspecified eye: Secondary | ICD-10-CM | POA: Diagnosis not present

## 2012-05-03 DIAGNOSIS — M48061 Spinal stenosis, lumbar region without neurogenic claudication: Secondary | ICD-10-CM | POA: Diagnosis not present

## 2012-05-07 ENCOUNTER — Telehealth: Payer: Self-pay | Admitting: Cardiology

## 2012-05-07 NOTE — Telephone Encounter (Signed)
Pt calling re if pt can come off plavix , does he also need to go off asa and high bp med per  dr Kalman Jewels  Ortho pedic, needs spinal injection need to come off for 5 days? pls call

## 2012-05-07 NOTE — Telephone Encounter (Signed)
Injection not yet scheduled per pt.

## 2012-05-07 NOTE — Telephone Encounter (Signed)
Can pt come off Plavix and ASA 5 days prior to injection?

## 2012-05-08 NOTE — Telephone Encounter (Signed)
Pt notified.  This note was faxed to Dr Nelva Bush' scheduler so that they would know that Mr Mamone can come off his Plavix for 5 days.

## 2012-05-08 NOTE — Telephone Encounter (Signed)
He will be okay for the patient to come off Plavix for 5 days. My preference would be that the patient remain on aspirin. However if this is felt not to be safe, he can come off aspirin also.

## 2012-05-09 DIAGNOSIS — H04129 Dry eye syndrome of unspecified lacrimal gland: Secondary | ICD-10-CM | POA: Diagnosis not present

## 2012-05-12 ENCOUNTER — Encounter: Payer: Self-pay | Admitting: Internal Medicine

## 2012-05-12 DIAGNOSIS — M48061 Spinal stenosis, lumbar region without neurogenic claudication: Secondary | ICD-10-CM | POA: Insufficient documentation

## 2012-05-13 ENCOUNTER — Other Ambulatory Visit: Payer: Self-pay | Admitting: Internal Medicine

## 2012-05-23 DIAGNOSIS — M48061 Spinal stenosis, lumbar region without neurogenic claudication: Secondary | ICD-10-CM | POA: Diagnosis not present

## 2012-06-07 DIAGNOSIS — M48061 Spinal stenosis, lumbar region without neurogenic claudication: Secondary | ICD-10-CM | POA: Diagnosis not present

## 2012-06-07 DIAGNOSIS — M961 Postlaminectomy syndrome, not elsewhere classified: Secondary | ICD-10-CM | POA: Diagnosis not present

## 2012-07-04 DIAGNOSIS — H04129 Dry eye syndrome of unspecified lacrimal gland: Secondary | ICD-10-CM | POA: Diagnosis not present

## 2012-07-04 DIAGNOSIS — H02839 Dermatochalasis of unspecified eye, unspecified eyelid: Secondary | ICD-10-CM | POA: Diagnosis not present

## 2012-07-04 DIAGNOSIS — Z961 Presence of intraocular lens: Secondary | ICD-10-CM | POA: Diagnosis not present

## 2012-07-06 DIAGNOSIS — D485 Neoplasm of uncertain behavior of skin: Secondary | ICD-10-CM | POA: Diagnosis not present

## 2012-08-08 ENCOUNTER — Telehealth: Payer: Self-pay | Admitting: *Deleted

## 2012-08-08 MED ORDER — RANITIDINE HCL 150 MG PO TABS: 150.0000 mg | ORAL_TABLET | Freq: Two times a day (BID) | ORAL | Status: DC

## 2012-08-08 NOTE — Telephone Encounter (Signed)
Message copied by Marylen Ponto on Wed Aug 08, 2012 10:03 AM ------      Message from: Hendricks Limes      Created: Tue Aug 07, 2012  6:41 PM       Plavix cannot be taken with any  protein pump inhibitor such as Nexium. Ranitidine 150 mg twice a day before meals can be taken with Plavix.

## 2012-08-08 NOTE — Telephone Encounter (Signed)
Discuss with patient  

## 2012-09-14 DIAGNOSIS — Z23 Encounter for immunization: Secondary | ICD-10-CM | POA: Diagnosis not present

## 2012-09-24 ENCOUNTER — Other Ambulatory Visit (HOSPITAL_COMMUNITY): Payer: Self-pay | Admitting: Diagnostic Radiology

## 2012-09-24 ENCOUNTER — Other Ambulatory Visit (HOSPITAL_COMMUNITY): Payer: Self-pay | Admitting: Urology

## 2012-09-24 DIAGNOSIS — N281 Cyst of kidney, acquired: Secondary | ICD-10-CM

## 2012-10-02 ENCOUNTER — Telehealth: Payer: Self-pay | Admitting: Internal Medicine

## 2012-10-02 DIAGNOSIS — L409 Psoriasis, unspecified: Secondary | ICD-10-CM

## 2012-10-02 NOTE — Telephone Encounter (Signed)
pt would like a referral for a dermatologist for his psoriasis, pt stated he has not been seen for the condition but Hopp., knows about it  Cb# [id number]

## 2012-10-02 NOTE — Telephone Encounter (Signed)
Per Dr.Hopper ok to place referral

## 2012-10-03 ENCOUNTER — Other Ambulatory Visit: Payer: Self-pay | Admitting: Cardiology

## 2012-10-04 DIAGNOSIS — H0230 Blepharochalasis unspecified eye, unspecified eyelid: Secondary | ICD-10-CM | POA: Insufficient documentation

## 2012-10-04 DIAGNOSIS — H02409 Unspecified ptosis of unspecified eyelid: Secondary | ICD-10-CM | POA: Diagnosis not present

## 2012-10-12 DIAGNOSIS — D485 Neoplasm of uncertain behavior of skin: Secondary | ICD-10-CM | POA: Diagnosis not present

## 2012-10-12 DIAGNOSIS — L570 Actinic keratosis: Secondary | ICD-10-CM | POA: Diagnosis not present

## 2012-10-24 ENCOUNTER — Ambulatory Visit (HOSPITAL_COMMUNITY)
Admission: RE | Admit: 2012-10-24 | Discharge: 2012-10-24 | Disposition: A | Discharge status: Home / Self Care | Payer: Medicare Other | Source: Ambulatory Visit | Attending: Urology | Admitting: Urology

## 2012-10-24 DIAGNOSIS — Z905 Acquired absence of kidney: Secondary | ICD-10-CM | POA: Insufficient documentation

## 2012-10-24 DIAGNOSIS — N281 Cyst of kidney, acquired: Secondary | ICD-10-CM

## 2012-11-02 DIAGNOSIS — Z9889 Other specified postprocedural states: Secondary | ICD-10-CM | POA: Insufficient documentation

## 2012-11-02 DIAGNOSIS — C679 Malignant neoplasm of bladder, unspecified: Secondary | ICD-10-CM | POA: Diagnosis not present

## 2012-11-02 DIAGNOSIS — N281 Cyst of kidney, acquired: Secondary | ICD-10-CM | POA: Diagnosis not present

## 2012-11-02 DIAGNOSIS — N400 Enlarged prostate without lower urinary tract symptoms: Secondary | ICD-10-CM | POA: Diagnosis not present

## 2012-11-02 HISTORY — DX: Other specified postprocedural states: Z98.890

## 2012-11-15 ENCOUNTER — Telehealth: Payer: Self-pay | Admitting: Internal Medicine

## 2012-11-15 NOTE — Telephone Encounter (Signed)
Hopp please advise on med request

## 2012-11-15 NOTE — Telephone Encounter (Signed)
Pt called and wanted to make med check appt and also see if Linna Darner could give him something to help with the loss of his wife. I was able to get appt for Monday but wanted you to know about rx and wife.

## 2012-11-15 NOTE — Telephone Encounter (Signed)
Lorazepam 0.5 mg one every 8-12 hours as needed. Dispense 30.  Please let him know I have written him a letter

## 2012-11-16 ENCOUNTER — Other Ambulatory Visit: Payer: Self-pay

## 2012-11-16 MED ORDER — LORAZEPAM 0.5 MG PO TABS: ORAL_TABLET | ORAL | Status: DC

## 2012-11-16 NOTE — Telephone Encounter (Signed)
Pt LMOVM stating Lorazepam Rx isn't at [facility] when he went by there this AM. I see you sent it yesterday 8:09am PLz advise pt MW

## 2012-11-16 NOTE — Telephone Encounter (Signed)
RX resent

## 2012-11-16 NOTE — Telephone Encounter (Signed)
Patient aware rx sent in, patient received letter yesterday

## 2012-11-19 ENCOUNTER — Ambulatory Visit (INDEPENDENT_AMBULATORY_CARE_PROVIDER_SITE_OTHER): Payer: Medicare Other | Admitting: Internal Medicine

## 2012-11-19 ENCOUNTER — Encounter: Payer: Self-pay | Admitting: Internal Medicine

## 2012-11-19 VITALS — BP 122/80 | HR 74 | Wt 165.8 lb

## 2012-11-19 DIAGNOSIS — I10 Essential (primary) hypertension: Secondary | ICD-10-CM

## 2012-11-19 DIAGNOSIS — E785 Hyperlipidemia, unspecified: Secondary | ICD-10-CM | POA: Diagnosis not present

## 2012-11-19 NOTE — Progress Notes (Signed)
  Subjective:    Patient ID: Collin [name], male    DOB: 1925/02/23, 78 y.o.   MRN: [id number]  HPI  He is concerned about his cholesterol; he stopped his atorvastatin 3 months ago because of leg pain. It actually decrease the dose by half prior to discontinuing it totally.   He is not on an exercise program and has no specific diet. He describes shortness of breath after working 10-15 minutes. He doesn't not have frank claudication but soreness with ambulation. He denies chest pain, palpitations, or edema.  His blood pressure is well controlled at home; he does not monitor it  on a routine basis. He is compliant with his blood pressure medicines. The only elevation was noted @ a dental appointment. He is on SBE prophylaxis   Review of Systems Unfortunately he recently lost his wife; lorazepam which  was called in has been effective     Objective:   Physical Exam General appearance is one of good health and nourishment w/o distress.  Eyes: No conjunctival inflammation or scleral icterus is present.  Oral exam: Dental hygiene is good; lips and gums are healthy appearing.There is no oropharyngeal erythema or exudate noted.   Heart:  Normal rate and regular rhythm. S1 and S2 normal without gallop,  click, rub or other extra sounds  .Grade 1/6 systolic murmur R base   Lungs:Chest clear to auscultation; no wheezes, rhonchi,rales ,or rubs present.No increased work of breathing.   Abdomen: bowel sounds normal, soft and non-tender without masses, organomegaly or hernias noted.  No guarding or rebound   Skin:Warm & dry.  Intact without suspicious lesions or rashes ; no jaundice or tenting. Myriadhypopigmented keratotic lesions of feet   All pulses intact without  bruits .No ischemic skin changes.    Deformed toenails   Light touch normal over feet.   Lymphatic: No lymphadenopathy is noted about the head, neck, axilla              Assessment & Plan:

## 2012-11-19 NOTE — Patient Instructions (Addendum)
Blood Pressure Goal = < 140/90; Ideal is an AVERAGE < 135/85. This AVERAGE should be calculated from @ least 5-7 BP readings taken @ different times of day on different days of week. You should not respond to isolated BP readings , but rather the AVERAGE for that week  If you activate My Chart; the results can be released to you as soon as they populate from the lab. If you choose not to use this program; the labs have to be reviewed, copied & mailed   causing a delay in getting the results to you.

## 2012-11-19 NOTE — Assessment & Plan Note (Signed)
BMET today

## 2012-11-19 NOTE — Assessment & Plan Note (Signed)
Fasting lipids today.

## 2012-11-20 LAB — LIPID PANEL
HDL: 28.3 mg/dL — ABNORMAL LOW (ref >39.00)
Triglycerides: 864 mg/dL — ABNORMAL HIGH (ref 0.0–149.0)
VLDL: 172.8 mg/dL — ABNORMAL HIGH (ref 0.0–40.0)

## 2012-11-20 LAB — BASIC METABOLIC PANEL
CO2: 29 mEq/L (ref 19–32)
Calcium: 8.9 mg/dL (ref 8.4–10.5)
Chloride: 102 mEq/L (ref 96–112)
Potassium: 4.7 mEq/L (ref 3.5–5.1)
Sodium: 136 mEq/L (ref 135–145)

## 2012-11-20 LAB — TSH: TSH: 2.09 u[IU]/mL (ref 0.35–5.50)

## 2012-12-28 ENCOUNTER — Telehealth: Payer: Self-pay | Admitting: Internal Medicine

## 2012-12-28 DIAGNOSIS — R7309 Other abnormal glucose: Secondary | ICD-10-CM

## 2012-12-28 DIAGNOSIS — E8881 Metabolic syndrome: Secondary | ICD-10-CM

## 2012-12-28 NOTE — Telephone Encounter (Signed)
Patient is due for :fasting lipids, A1c/ 277.7,790.29. He would like to go to [place] lab. Can you put in the orders please? I can call the pt and let him know once they have been placed.

## 2012-12-28 NOTE — Telephone Encounter (Signed)
Left message on VM informing patient order placed for A1c

## 2012-12-31 ENCOUNTER — Other Ambulatory Visit (INDEPENDENT_AMBULATORY_CARE_PROVIDER_SITE_OTHER): Payer: Medicare Other

## 2012-12-31 DIAGNOSIS — R7309 Other abnormal glucose: Secondary | ICD-10-CM | POA: Diagnosis not present

## 2012-12-31 DIAGNOSIS — E8881 Metabolic syndrome: Secondary | ICD-10-CM

## 2013-01-11 DIAGNOSIS — D485 Neoplasm of uncertain behavior of skin: Secondary | ICD-10-CM | POA: Diagnosis not present

## 2013-01-11 DIAGNOSIS — L570 Actinic keratosis: Secondary | ICD-10-CM | POA: Diagnosis not present

## 2013-02-06 ENCOUNTER — Emergency Department (INDEPENDENT_AMBULATORY_CARE_PROVIDER_SITE_OTHER)
Admission: EM | Admit: 2013-02-06 | Discharge: 2013-02-06 | Disposition: A | Discharge status: Home / Self Care | Payer: Medicare Other | Source: Home / Self Care | Attending: Family Medicine | Admitting: Family Medicine

## 2013-02-06 ENCOUNTER — Encounter (HOSPITAL_COMMUNITY): Payer: Self-pay | Admitting: *Deleted

## 2013-02-06 DIAGNOSIS — K2970 Gastritis, unspecified, without bleeding: Secondary | ICD-10-CM | POA: Diagnosis not present

## 2013-02-06 DIAGNOSIS — H6690 Otitis media, unspecified, unspecified ear: Secondary | ICD-10-CM

## 2013-02-06 MED ORDER — CIPROFLOXACIN-DEXAMETHASONE 0.3-0.1 % OT SUSP: 4.0000 [drp] | Freq: Two times a day (BID) | OTIC | Status: DC

## 2013-02-06 MED ORDER — SUCRALFATE 1 GM/10ML PO SUSP: 1.0000 g | Freq: Three times a day (TID) | ORAL | Status: DC

## 2013-02-06 MED ORDER — ACETAMINOPHEN 650 MG PO TABS: 1.0000 | ORAL_TABLET | Freq: Four times a day (QID) | ORAL | Status: DC | PRN

## 2013-02-06 MED ORDER — AMOXICILLIN 500 MG PO CAPS: 500.0000 mg | ORAL_CAPSULE | Freq: Three times a day (TID) | ORAL | Status: DC

## 2013-02-06 NOTE — ED Notes (Signed)
Pt  Reports  Symptoms   Of  r  Earache           X  8  Days     With       Symptoms  Of  Epigastric /  Stomach  Pain as  Well  Which  He  Reports  He  Has  Been taking  Anti  Acids  For   -   He  Reports  He  Has an appt tommorow  With  Dr  Linna Darner  But   Was afraid  He  Would  Not be  Able to make  It  Due  To  Weather

## 2013-02-07 ENCOUNTER — Ambulatory Visit: Payer: Medicare Other | Admitting: Internal Medicine

## 2013-02-07 NOTE — ED Provider Notes (Signed)
History     CSN: [id number]  Arrival date & time 02/06/13  1010   First MD Initiated Contact with Patient 02/06/13 1021      Chief Complaint  Patient presents with  . Otalgia    (Consider location/radiation/quality/duration/timing/severity/associated sxs/prior treatment) HPI Comments: Mr. Callison is a 79 year old male with history of hypertension, coronary artery disease on anticoagulation with Plavix also status post nephrectomy due to renal cell carcinoma. Here complaining of right ear pain for 8 days. Reports the ear pain is dull and constant worse when sleeping over the right side. Denies tinnitus or drainage. Pain improves with over-the-counter eardrops. Reports mild recent nasal congestion but denies cough or other respiratory symptoms. No associated headache. Patient also complaining of epigastric pain for about 3 days. Describe pain as "soreness" in his upper abdomen that gets worse right after eating and gets improved with Zantac. Some movements also trigger "soreness" in his lower chest upper abdominal area. Denies diaphoreses, pain radiation, chest heaviness or shortness of breath. Patient states he was working out the treadmill yesterday without any chest discomfort or shortness of breath. No nausea or vomiting. Feels hungry but has had decreased oral intake to avoid making his pain worse. Denies melena. Last bowel movement this morning soft brown. Denies diarrhea. Patient reports that he waxed all the floors of his house on the days prior to soreness in his belly started.  He was on nerium in the past but this medication was discontinued by his PCP when he was started on plavix.   Past Medical History  Diagnosis Date  . CAD (coronary artery disease) 2005    DES January 2 005, residual disease  / Myoview  . Anemia   . GERD (gastroesophageal reflux disease)   . COPD (chronic obstructive pulmonary disease)   . HTN (hypertension)   . Hyperlipidemia   . Low back pain syndrome   .  Encounter for long-term (current) use of other medications   . Dysfunction of eustachian tube   . Reactive airway disease   . Carcinoma, renal cell   . Thrombocytopenia   . Bladder cancer   . Hx of skin cancer, basal cell   . Ejection fraction     EF 55%, echo, January, 2009  . Pulmonary nodule     Chest CT, followed  . Carotid artery disease     Doppler, September, 2010, normal, no carotid artery disease    Past Surgical History  Procedure Laterality Date  . Osteomyelitis      staph aureus  . Nephrectomy  2003    left  . Bladder tumors  2003  . Cholecystectomy  2004  . Ureterectomy  2003    for cancer  . Corneal transplant  2003    sutured  . Corneal transplant  2009    Family History  Problem Relation Age of Onset  . Heart failure Mother 61  . Coronary artery disease Brother     History  Substance Use Topics  . Smoking status: Former Smoker    Quit date: 12/27/1991  . Smokeless tobacco: Not on file     Comment: Quit 20-30 years ago as of 2012  . Alcohol Use: No      Review of Systems  Constitutional: Negative for fever, chills, diaphoresis, appetite change and fatigue.  HENT: Positive for ear pain. Negative for hearing loss, congestion, sore throat, rhinorrhea, neck pain, sinus pressure, tinnitus and ear discharge.   Respiratory: Negative for cough, chest tightness and shortness of  breath.   Cardiovascular: Negative for chest pain, palpitations and leg swelling.  Gastrointestinal: Positive for abdominal pain. Negative for nausea, vomiting and diarrhea.  Genitourinary: Negative for dysuria.  Musculoskeletal: Negative for back pain.  Skin: Negative for rash.  Neurological: Negative for dizziness and headaches.  All other systems reviewed and are negative.    Allergies  Trandolapril  Home Medications   Current Outpatient Rx  Name  Route  Sig  Dispense  Refill  . Acetaminophen 650 MG TABS   Oral   Take 1 tablet (650 mg total) by mouth 4 (four) times  daily as needed.   30 tablet   0   . alfuzosin (UROXATRAL) 10 MG 24 hr tablet   Oral   Take 1 tablet by mouth daily.         Marland Kitchen amoxicillin (AMOXIL) 500 MG capsule   Oral   Take 1 capsule (500 mg total) by mouth 3 (three) times daily.   21 capsule   0   . aspirin 81 MG tablet   Oral   Take 81 mg by mouth daily.           Marland Kitchen atorvastatin (LIPITOR) 20 MG tablet               . ciprofloxacin-dexamethasone (CIPRODEX) otic suspension   Right Ear   Place 4 drops into the right ear 2 (two) times daily.   7.5 mL   0   . clopidogrel (PLAVIX) 75 MG tablet   Oral   Take 1 tablet (75 mg total) by mouth daily.   90 tablet   3   . DIOVAN 160 MG tablet      TAKE 1 TABLET DAILY   90 tablet   3   . dutasteride (AVODART) 0.5 MG capsule   Oral   Take 0.5 mg by mouth daily.           . finasteride (PROSCAR) 5 MG tablet   Oral   Take 5 mg by mouth daily.         Marland Kitchen LORazepam (ATIVAN) 0.5 MG tablet      1 by mouth every 8-12 hours as needed   30 tablet   0   . Multiple Vitamin (MULTIVITAMIN) tablet   Oral   Take 1 tablet by mouth daily.           . ranitidine (ZANTAC) 150 MG tablet   Oral   Take 150 mg by mouth at bedtime.         . sucralfate (CARAFATE) 1 GM/10ML suspension   Oral   Take 10 mLs (1 g total) by mouth 4 (four) times daily -  with meals and at bedtime.   420 mL   0     BP 112/59  Pulse 78  Temp(Src) 97.6 F (36.4 C) (Oral)  Resp 16  SpO2 99%  Physical Exam  Nursing note and vitals reviewed. Constitutional: He appears well-developed and well-nourished. No distress.  Looks comfortable patient is pleasant and cooperative.  HENT:  Head: Normocephalic and atraumatic.  Mild nasal congestion with erythema and discrete swelling of nasal turbinates, no rhinorrhea. no pharyngeal erythema no exudates. No uvula deviation. No trismus. Right TM with increased vascular markings and dullness bilaterally no swelling or bulging, impress cloudy  fluid behind TM. Left TM: partially visualized due to wax. Appears normal.  Eyes: Conjunctivae and EOM are normal. Pupils are equal, round, and reactive to light. Right eye exhibits no discharge. Left eye exhibits  no discharge.  Skin: He is not diaphoretic.    ED Course  Procedures (including critical care time)  Labs Reviewed - No data to display No results found.   1. Otitis media   2. Gastritis     EKG: Normal sinus rhythm with a ventricular rate at 61 beats per minute. No acute ST or other ischemic changes. EKG unchanged from prior EKG in February 2013.  MDM  Right TM with dullness and mild irritation on ear canal possible findings are modified due to otc drops decided to treat for otitis media with amoxicillin and ciprodex. In general reassuring physical exam and EKG. Impress upper abdominal discomfort related to gastritis/gerd exacerbation and muscle strain after waxing floors. guaic negative. Prescribed sucralfate, acetaminophen. Patient off PPI's due to possible interaction with anticoagulation therapy with plavix.  Supportive care and red flax that should prompt his return to medical attention discussed with patient and provided in writing.   Randa Spike, MD 02/08/13 1010

## 2013-02-09 ENCOUNTER — Other Ambulatory Visit: Payer: Self-pay

## 2013-02-12 ENCOUNTER — Other Ambulatory Visit: Payer: Self-pay

## 2013-02-26 DIAGNOSIS — I252 Old myocardial infarction: Secondary | ICD-10-CM | POA: Diagnosis not present

## 2013-02-26 DIAGNOSIS — Z9861 Coronary angioplasty status: Secondary | ICD-10-CM | POA: Diagnosis not present

## 2013-02-26 DIAGNOSIS — Z7982 Long term (current) use of aspirin: Secondary | ICD-10-CM | POA: Diagnosis not present

## 2013-02-26 DIAGNOSIS — Z01810 Encounter for preprocedural cardiovascular examination: Secondary | ICD-10-CM | POA: Diagnosis not present

## 2013-02-26 DIAGNOSIS — H02409 Unspecified ptosis of unspecified eyelid: Secondary | ICD-10-CM | POA: Diagnosis not present

## 2013-02-26 DIAGNOSIS — N400 Enlarged prostate without lower urinary tract symptoms: Secondary | ICD-10-CM | POA: Diagnosis not present

## 2013-02-26 DIAGNOSIS — Z85528 Personal history of other malignant neoplasm of kidney: Secondary | ICD-10-CM | POA: Diagnosis not present

## 2013-02-26 DIAGNOSIS — Z9849 Cataract extraction status, unspecified eye: Secondary | ICD-10-CM | POA: Diagnosis not present

## 2013-02-26 DIAGNOSIS — I10 Essential (primary) hypertension: Secondary | ICD-10-CM | POA: Diagnosis not present

## 2013-02-26 DIAGNOSIS — I2510 Atherosclerotic heart disease of native coronary artery without angina pectoris: Secondary | ICD-10-CM | POA: Diagnosis not present

## 2013-02-26 DIAGNOSIS — Z91010 Allergy to peanuts: Secondary | ICD-10-CM | POA: Diagnosis not present

## 2013-02-26 DIAGNOSIS — K219 Gastro-esophageal reflux disease without esophagitis: Secondary | ICD-10-CM | POA: Diagnosis not present

## 2013-02-26 DIAGNOSIS — H0230 Blepharochalasis unspecified eye, unspecified eyelid: Secondary | ICD-10-CM | POA: Diagnosis not present

## 2013-02-26 DIAGNOSIS — Z7902 Long term (current) use of antithrombotics/antiplatelets: Secondary | ICD-10-CM | POA: Diagnosis not present

## 2013-02-26 DIAGNOSIS — H18519 Endothelial corneal dystrophy, unspecified eye: Secondary | ICD-10-CM | POA: Diagnosis not present

## 2013-02-26 DIAGNOSIS — Z961 Presence of intraocular lens: Secondary | ICD-10-CM | POA: Diagnosis not present

## 2013-02-26 DIAGNOSIS — Z947 Corneal transplant status: Secondary | ICD-10-CM | POA: Diagnosis not present

## 2013-03-20 DIAGNOSIS — H02409 Unspecified ptosis of unspecified eyelid: Secondary | ICD-10-CM | POA: Diagnosis not present

## 2013-03-20 DIAGNOSIS — I2510 Atherosclerotic heart disease of native coronary artery without angina pectoris: Secondary | ICD-10-CM | POA: Diagnosis not present

## 2013-03-20 DIAGNOSIS — H0230 Blepharochalasis unspecified eye, unspecified eyelid: Secondary | ICD-10-CM | POA: Diagnosis not present

## 2013-03-20 DIAGNOSIS — K219 Gastro-esophageal reflux disease without esophagitis: Secondary | ICD-10-CM | POA: Diagnosis not present

## 2013-03-20 DIAGNOSIS — N400 Enlarged prostate without lower urinary tract symptoms: Secondary | ICD-10-CM | POA: Diagnosis not present

## 2013-03-20 DIAGNOSIS — I10 Essential (primary) hypertension: Secondary | ICD-10-CM | POA: Diagnosis not present

## 2013-03-28 DIAGNOSIS — H0230 Blepharochalasis unspecified eye, unspecified eyelid: Secondary | ICD-10-CM | POA: Diagnosis not present

## 2013-03-28 DIAGNOSIS — H02409 Unspecified ptosis of unspecified eyelid: Secondary | ICD-10-CM | POA: Diagnosis not present

## 2013-04-04 DIAGNOSIS — N529 Male erectile dysfunction, unspecified: Secondary | ICD-10-CM | POA: Diagnosis not present

## 2013-04-04 DIAGNOSIS — N400 Enlarged prostate without lower urinary tract symptoms: Secondary | ICD-10-CM | POA: Diagnosis not present

## 2013-04-04 DIAGNOSIS — N62 Hypertrophy of breast: Secondary | ICD-10-CM | POA: Diagnosis not present

## 2013-04-10 ENCOUNTER — Other Ambulatory Visit: Payer: Self-pay | Admitting: *Deleted

## 2013-04-10 MED ORDER — VALSARTAN 160 MG PO TABS: ORAL_TABLET | ORAL | Status: DC

## 2013-04-23 ENCOUNTER — Encounter: Payer: Self-pay | Admitting: Radiation Oncology

## 2013-04-23 ENCOUNTER — Encounter: Payer: Self-pay | Admitting: *Deleted

## 2013-04-23 DIAGNOSIS — N62 Hypertrophy of breast: Secondary | ICD-10-CM | POA: Insufficient documentation

## 2013-04-23 DIAGNOSIS — C649 Malignant neoplasm of unspecified kidney, except renal pelvis: Secondary | ICD-10-CM | POA: Insufficient documentation

## 2013-04-23 NOTE — Progress Notes (Signed)
Radiation Oncology         [phone] ________________________________  Initial outpatient Consultation  Name: Collin [name] MRN: [id number]  Date: 04/24/2013  DOB: 10-20-1925  CC:Unice Cobble, MD  Molli Hazard,*   REFERRING PHYSICIAN: Molli Hazard,*  DIAGNOSIS: 79 yo gentleman with gynecomastia from finasteride for BPH  HISTORY OF PRESENT ILLNESS::Collin [name] is a 79 y.o. male with a history of left ureteral cancer s/p left ureteronephrectomy in 2002 without evidence of recurrence. He is also suffered with benign prostatic hypertrophy with an enlarged median lobe. His BPH has responded well to Avodart since 2010. However, the patient began to develop mild breast enlargement and was switched to finasteride 6 months ago. He continues to note mild breast enlargement and was offered either discontinuation of reductase inhibitors versus radiation to the breast. The patient has, been referred today to discuss possible radiation for gynecomastia.  PREVIOUS RADIATION THERAPY: No  PAST MEDICAL HISTORY:  has a past medical history of CAD (coronary artery disease) (2005); Anemia; GERD (gastroesophageal reflux disease); COPD (chronic obstructive pulmonary disease); HTN (hypertension); Hyperlipidemia; Low back pain syndrome; Encounter for long-term (current) use of other medications; Dysfunction of eustachian tube; Reactive airway disease; Thrombocytopenia; Bladder cancer; skin cancer, basal cell; Ejection fraction; Pulmonary nodule; Carotid artery disease; Gynecomastia; BPH (benign prostatic hyperplasia); Organic impotence; Enlarged prostate; Cancer (02/28/2001); Myocardial infarction; Arthritis; Hypercholesterolemia; Carcinoma, renal cell; Skin cancer; Allergy; Nocturia; cystoscopy (11/02/12); and Staph infection (04/1997).    PAST SURGICAL HISTORY: Past Surgical History  Procedure Laterality Date  . Osteomyelitis      staph aureus  . Nephrectomy  02/2001    left kidney, ureter    . Bladder tumors  2003, 2004, 01/2009    removed  . Cholecystectomy  07/2003  . Ureterectomy  2003    for cancer  . Corneal transplant  04/2002    sutured, right  . Corneal transplant  10/2008    left  . Cardiac catheterization    . Back surgery  02/1996    FAMILY HISTORY: family history includes Coronary artery disease in his brother; Diabetes in his brother, mother, and sisters; and Heart failure (age of onset: 60) in his mother.  SOCIAL HISTORY:  reports that he quit smoking about 21 years ago. His smoking use included Pipe. He does not have any smokeless tobacco history on file. He reports that he does not drink alcohol or use illicit drugs.  ALLERGIES: Trandolapril  MEDICATIONS:  Current Outpatient Prescriptions  Medication Sig Dispense Refill  . Acetaminophen 650 MG TABS Take 1 tablet (650 mg total) by mouth 4 (four) times daily as needed.  30 tablet  0  . alfuzosin (UROXATRAL) 10 MG 24 hr tablet Take 1 tablet by mouth daily.      Marland Kitchen aspirin 81 MG tablet Take 81 mg by mouth daily.        Marland Kitchen atorvastatin (LIPITOR) 20 MG tablet       . clopidogrel (PLAVIX) 75 MG tablet Take 1 tablet (75 mg total) by mouth daily.  90 tablet  3  . dutasteride (AVODART) 0.5 MG capsule Take 0.5 mg by mouth daily.        . finasteride (PROSCAR) 5 MG tablet Take 5 mg by mouth daily.      Marland Kitchen LORazepam (ATIVAN) 0.5 MG tablet 1 by mouth every 8-12 hours as needed  30 tablet  0  . Multiple Vitamin (MULTIVITAMIN) tablet Take 1 tablet by mouth daily.        Marland Kitchen  ranitidine (ZANTAC) 150 MG tablet Take 150 mg by mouth at bedtime.      . sildenafil (VIAGRA) 100 MG tablet Take 100 mg by mouth daily as needed for erectile dysfunction. Take 1/2 - 1 tablet prn last rx 01/17/13      . valsartan (DIOVAN) 160 MG tablet TAKE 1 TABLET DAILY  90 tablet  3   No current facility-administered medications for this encounter.    REVIEW OF SYSTEMS:  A 15 point review of systems is documented in the electronic medical record. This  was obtained by the nursing staff. However, I reviewed this with the patient to discuss relevant findings and make appropriate changes.  Pertinent items are noted in HPI.   PHYSICAL EXAM:  height is 5\' 8"  (1.727 m) and weight is 163 lb 4.8 oz (74.072 kg). His oral temperature is 98.6 F (37 C). His blood pressure is 132/76 and his pulse is 72. His respiration is 20.   The patient is in no acute distress today. He is alert oriented. He is well-nourished and well-developed. His ears and nose are normal and parents. His oropharynx is normal. His neck and parents is normal with no jugular venous distention. The patient is in no respiratory distress with normal heart rate. Her tumor pulses are normal with no peripheral edema. Patient does have some breast enlargement bilaterally. His abdomen is rounded and soft but not distended and nontender. Neurological the patient is grossly intact.  The patient does have some mild gynecomastia bilaterally.  The breast tissue is fibroglandular without any tenderness or dominant masses.    IMPRESSION: This patient is very nice 79 year old gentleman with gynecomastia which is felt to be induced by Avodart and finasteride which have been used to help manage BPH. The patient would prefer to remain on finasteride to help with urinary symptoms.  PLAN:Today, I talked to the patient about the findings and work-up thus far.  We discussed the natural history of gynecomastia and general treatment, highlighting the role or breast radiotherapy in the management.  We discussed the available radiation techniques, and focused on the details of logistics and delivery.  We reviewed the anticipated acute and late sequelae associated with radiation in this setting.  The patient was encouraged to ask questions that I answered to the best of my ability.  I filled out a patient counseling form during our discussion including treatment diagrams.  We retained a copy for our records.  The patient  would like to proceed with radiation and will be scheduled for CT simulation.  I spent 15 minutes minutes face to face with the patient and more than 50% of that time was spent in counseling and/or coordination of care.    ------------------------------------------------  Sheral Apley. Tammi Klippel, M.D.   Reference:  Dicker, A, The safety and tolerability of low-dose irradiation for the management of gynaecomastia caused by antiandrogen monotherapy, [employer] [facility] Oncology, Vol 4, January 2003, pages 30-36

## 2013-04-23 NOTE — Progress Notes (Signed)
GU Location of Tumor / Histology: hx bladder cancer 2002, no xrt, BCG bladder tx 07/2003  If Prostate Cancer, Gleason Score is ( + ) and PSA is () enlarged Prostate Patient presented 85months ago with signs/symptoms of: gynecomastia b/l ,BPH, impotence 5 years Biopsies of NA (if applicable) revealed: NA  Past/Anticipated interventions by urology, if any: Avodart since 2010, uses now Finasteride alfusosin, discussed stopping reductase inhibitors vs radiation Past/Anticipated interventions by medical oncology, if any: none  Weight changes, if any: none Bowel/Bladder complaints, if any: none  Nausea/Vomiting, if any: none Pain issues, if any:  none  SAFETY ISSUES:  Prior radiation? No  Pacemaker/ICD? no  Possible current pregnancy? N/A  Is the patient on methotrexate? 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

## 2013-04-24 ENCOUNTER — Encounter: Payer: Self-pay | Admitting: Radiation Oncology

## 2013-04-24 ENCOUNTER — Ambulatory Visit
Admission: RE | Admit: 2013-04-24 | Discharge: 2013-04-24 | Disposition: A | Discharge status: Home / Self Care | Payer: Medicare Other | Source: Ambulatory Visit | Attending: Radiation Oncology | Admitting: Radiation Oncology

## 2013-04-24 VITALS — BP 132/76 | HR 72 | Temp 98.6°F | Resp 20 | Ht 68.0 in | Wt 163.3 lb

## 2013-04-24 DIAGNOSIS — Z8559 Personal history of malignant neoplasm of other urinary tract organ: Secondary | ICD-10-CM | POA: Diagnosis not present

## 2013-04-24 DIAGNOSIS — N401 Enlarged prostate with lower urinary tract symptoms: Secondary | ICD-10-CM | POA: Insufficient documentation

## 2013-04-24 DIAGNOSIS — N62 Hypertrophy of breast: Secondary | ICD-10-CM

## 2013-04-24 DIAGNOSIS — R351 Nocturia: Secondary | ICD-10-CM | POA: Insufficient documentation

## 2013-04-24 DIAGNOSIS — Z79899 Other long term (current) drug therapy: Secondary | ICD-10-CM | POA: Diagnosis not present

## 2013-04-24 DIAGNOSIS — N138 Other obstructive and reflux uropathy: Secondary | ICD-10-CM | POA: Insufficient documentation

## 2013-04-24 HISTORY — DX: Unspecified staphylococcus as the cause of diseases classified elsewhere: B95.8

## 2013-04-24 NOTE — Progress Notes (Signed)
Please see the Nurse Progress Note in the MD Initial Consult Encounter for this patient. 

## 2013-04-24 NOTE — Addendum Note (Signed)
Encounter addended by: Andria Rhein, RN on: 04/24/2013 10:26 AM<BR>     Documentation filed: Charges VN

## 2013-04-26 ENCOUNTER — Other Ambulatory Visit: Payer: Self-pay | Admitting: Radiation Oncology

## 2013-05-03 ENCOUNTER — Encounter: Payer: Self-pay | Admitting: Radiation Oncology

## 2013-05-03 ENCOUNTER — Ambulatory Visit
Admission: RE | Admit: 2013-05-03 | Discharge: 2013-05-03 | Disposition: A | Discharge status: Home / Self Care | Payer: Medicare Other | Source: Ambulatory Visit | Attending: Radiation Oncology | Admitting: Radiation Oncology

## 2013-05-03 DIAGNOSIS — N62 Hypertrophy of breast: Secondary | ICD-10-CM | POA: Diagnosis not present

## 2013-05-03 DIAGNOSIS — C649 Malignant neoplasm of unspecified kidney, except renal pelvis: Secondary | ICD-10-CM

## 2013-05-03 DIAGNOSIS — Z510 Encounter for antineoplastic radiation therapy: Secondary | ICD-10-CM | POA: Diagnosis not present

## 2013-05-03 NOTE — Progress Notes (Signed)
Patient did not want to begin simulation today until he found out about insurance costs - Called [employer] (2ndary) to find out his final costs - spoke to [place] - plan pays 90% of allowable after $450 deductible (which has already been met); patient has $1,600 OOP and currently met $739.66, which leaves the patient $860.34 currently to satisfy.  After the OOP, plan will pay at 100%.  Gave the patient this information and he was satisfied and ready to proceed with simulation.

## 2013-05-06 NOTE — Progress Notes (Signed)
  Radiation Oncology         [phone] ________________________________  Name: SHADI SCOBEY MRN: [id number]  Date: 05/03/2013  DOB: [birth year]  SIMULATION AND TREATMENT PLANNING NOTE  DIAGNOSIS:  79 yo man with bilateral gynecomastia  NARRATIVE:  The patient was brought to the [facility].  Identity was confirmed.  All relevant records and images related to the planned course of therapy were reviewed.  The patient freely provided informed written consent to proceed with treatment after reviewing the details related to the planned course of therapy. The consent form was witnessed and verified by the simulation staff.  Then, the patient was set-up in a stable reproducible  supine position for radiation therapy.  CT images were obtained.  Surface markings were placed.  The CT images were loaded into the planning software.  Then the target and avoidance structures were contoured.  Treatment planning then occurred.  The radiation prescription was entered and confirmed.  Then, I designed and supervised the construction of a total of 1 medically necessary simple treatment device.  This was a cerrobend shield with a 10 cm circular aperture for the left and right side to cover the breast tissue while sparing the surrounding tissue.  I have requested : a special port plan for electron planning.   PLAN:  The patient will receive 12 Gy in 3 fractions to both breasts.  ________________________________  Sheral Apley Tammi Klippel, M.D.

## 2013-05-07 DIAGNOSIS — H0230 Blepharochalasis unspecified eye, unspecified eyelid: Secondary | ICD-10-CM | POA: Diagnosis not present

## 2013-05-10 DIAGNOSIS — N62 Hypertrophy of breast: Secondary | ICD-10-CM | POA: Diagnosis not present

## 2013-05-10 DIAGNOSIS — Z510 Encounter for antineoplastic radiation therapy: Secondary | ICD-10-CM | POA: Diagnosis not present

## 2013-05-12 NOTE — Progress Notes (Signed)
  Radiation Oncology         [phone] ________________________________  Name: Collin [name] MRN: [id number]  Date: 05/13/2013  DOB: 05-May-1925  Clinical electron setup Note  NARRATIVE: The patient was brought to the treatment unit and placed in the planned treatment position. The clinical setup was verified.  The electron treatment beams were carefully compared against the planned radiation fields. The position location and shape of the radiation fields was reviewed. The targeted volume of tissue appears to be appropriately covered by the radiation beams. Organs at risk appear to be excluded as planned.  Based on my personal review, I approved the bilateral electron fields. The patient's treatment will proceed as planned.  ------------------------------------------------  Sheral Apley Tammi Klippel, M.D.

## 2013-05-13 ENCOUNTER — Ambulatory Visit
Admission: RE | Admit: 2013-05-13 | Discharge: 2013-05-13 | Disposition: A | Discharge status: Home / Self Care | Payer: Medicare Other | Source: Ambulatory Visit | Attending: Radiation Oncology | Admitting: Radiation Oncology

## 2013-05-13 DIAGNOSIS — Z510 Encounter for antineoplastic radiation therapy: Secondary | ICD-10-CM | POA: Diagnosis not present

## 2013-05-13 DIAGNOSIS — N62 Hypertrophy of breast: Secondary | ICD-10-CM | POA: Diagnosis not present

## 2013-05-15 ENCOUNTER — Ambulatory Visit
Admission: RE | Admit: 2013-05-15 | Discharge: 2013-05-15 | Disposition: A | Discharge status: Home / Self Care | Payer: Medicare Other | Source: Ambulatory Visit | Attending: Radiation Oncology | Admitting: Radiation Oncology

## 2013-05-15 DIAGNOSIS — N62 Hypertrophy of breast: Secondary | ICD-10-CM | POA: Diagnosis not present

## 2013-05-15 DIAGNOSIS — Z510 Encounter for antineoplastic radiation therapy: Secondary | ICD-10-CM | POA: Diagnosis not present

## 2013-05-16 ENCOUNTER — Ambulatory Visit: Payer: Medicare Other

## 2013-05-17 ENCOUNTER — Ambulatory Visit
Admission: RE | Admit: 2013-05-17 | Discharge: 2013-05-17 | Disposition: A | Discharge status: Home / Self Care | Payer: Medicare Other | Source: Ambulatory Visit | Attending: Radiation Oncology | Admitting: Radiation Oncology

## 2013-05-17 ENCOUNTER — Encounter: Payer: Self-pay | Admitting: Radiation Oncology

## 2013-05-17 VITALS — BP 137/70 | HR 70 | Resp 16 | Wt 164.0 lb

## 2013-05-17 DIAGNOSIS — N62 Hypertrophy of breast: Secondary | ICD-10-CM | POA: Diagnosis not present

## 2013-05-17 DIAGNOSIS — Z510 Encounter for antineoplastic radiation therapy: Secondary | ICD-10-CM | POA: Diagnosis not present

## 2013-05-17 DIAGNOSIS — C649 Malignant neoplasm of unspecified kidney, except renal pelvis: Secondary | ICD-10-CM

## 2013-05-17 NOTE — Progress Notes (Signed)
  Radiation Oncology         [phone] ________________________________  Name: Collin [name] MRN: [id number]  Date: 05/17/2013  DOB: [birth year]  Weekly Radiation Therapy Management  Current Dose: 12 Gy     Planned Dose:  12 Gy  Narrative . . . . . . . . The patient presents for routine under treatment assessment.                                    The patient is without complaint.                                 Set-up films were reviewed.                                 The chart was checked. Physical Findings. . .  weight is 164 lb (74.39 kg). His blood pressure is 137/70 and his pulse is 70. His respiration is 16. . Weight essentially stable.  No significant changes. Impression . . . . . . . The patient is tolerating radiation. Plan . . . . . . . . . . . . Complete treatment as planned and follow-up in one month  ________________________________  Sheral Apley. Tammi Klippel, M.D.

## 2013-05-17 NOTE — Progress Notes (Addendum)
Patient presents to the clinic today unaccompanied for PUT with Dr. Tammi Klippel following final treatment. Patient given an appointment card to return in one month for follow up. Patient alert and oriented to person, place, and time. No distress noted. Steady gait noted. Pleasant affect noted. Patient denies pain at this time. Patient looks exceptionally well for his age. Patient has not complaints. Patient denies fatigue. Patient denies skin changes to bilateral treated breast. Encouraged patient to apply otc lotion to treated area once per day for the next two weeks to replenish moisture. Patient verbalized understanding. Encouraged patient to contact staff with future needs and he verbalized understanding. Reported all findings to Dr. Tammi Klippel.

## 2013-05-20 NOTE — Progress Notes (Signed)
  Radiation Oncology         [phone]) [phone] ________________________________  Name: Collin [name] MRN: [id number]  Date: 05/17/2013  DOB: [birth year]  End of Treatment Note  Diagnosis:  79 yo man with bilateral gynecomastia   Indication for treatment:  Prevention of pain/gynecomastia       Radiation treatment dates:  05/13/2013-05/17/2013  Site/dose:   The patient's right and left breast tissue was treated to 12 gray in 3 fractions of 4 gray  Beams/energy:   On both sides, a 10 cm circular collimator was used centered on the nipple. 6 megavolt photons were delivered to the depth of 90% dose. On CT imaging this was verified to cover the breast parenchymal tissue while maximally sparing the underlying lungs and intrathoracic contents.  Narrative: The patient tolerated radiation treatment relatively well.   No acute complications occurred. The patient did not experience any erythema or hair loss within the radiation fields during treatment.  Plan: The patient has completed radiation treatment. The patient will return to radiation oncology clinic for routine followup in one month. I advised the patient and his family to call or return sooner if they have any questions or concerns related to their recovery or treatment. ________________________________  Sheral Apley. Tammi Klippel, M.D.

## 2013-05-28 ENCOUNTER — Ambulatory Visit (INDEPENDENT_AMBULATORY_CARE_PROVIDER_SITE_OTHER): Payer: Medicare Other | Admitting: Internal Medicine

## 2013-05-28 ENCOUNTER — Encounter: Payer: Self-pay | Admitting: Internal Medicine

## 2013-05-28 ENCOUNTER — Other Ambulatory Visit: Payer: Self-pay

## 2013-05-28 ENCOUNTER — Telehealth: Payer: Self-pay | Admitting: Cardiology

## 2013-05-28 VITALS — BP 138/80 | HR 70 | Temp 98.1°F | Wt 164.0 lb

## 2013-05-28 DIAGNOSIS — H9209 Otalgia, unspecified ear: Secondary | ICD-10-CM

## 2013-05-28 DIAGNOSIS — H6120 Impacted cerumen, unspecified ear: Secondary | ICD-10-CM

## 2013-05-28 DIAGNOSIS — H6122 Impacted cerumen, left ear: Secondary | ICD-10-CM

## 2013-05-28 DIAGNOSIS — G8929 Other chronic pain: Secondary | ICD-10-CM

## 2013-05-28 MED ORDER — ATORVASTATIN CALCIUM 20 MG PO TABS: 20.0000 mg | ORAL_TABLET | Freq: Every day | ORAL | Status: DC

## 2013-05-28 MED ORDER — VALSARTAN 160 MG PO TABS: ORAL_TABLET | ORAL | Status: DC

## 2013-05-28 NOTE — Telephone Encounter (Signed)
New Problem  Donna(pt niece)  Is calling because pt went to pharmacy to have his prescription of LIPITOR refilled and it was denied. Per last ov pt is not due back until Jan 2015.  She asked if someone could give her a call.

## 2013-05-28 NOTE — Patient Instructions (Addendum)
  Nasonex 1 spray in right nostril twice a day until seen. Use the "crossover" technique into opposite nostril spraying toward opposite ear @ 45 degree angle, not straight up into nostril.    Please do not use Q-tips as we discussed. For wax build up in the left ear, please put 2-3 drops of mineral oil in the affected  ear at night to soften the wax .Cover the canal with a  cotton ball to prevent the oil from staining bed linens. In the morning fill the ear canal with hydrogen peroxide & lie in the opposite lateral decubitus position(on the side opposite the affected ear)  for 10-15 minutes. After allowing this period of time for the peroxide to dissolve the wax ;shower and use the thinnest washrag available to wick out the wax.

## 2013-05-28 NOTE — Progress Notes (Signed)
  Subjective:    Patient ID: Collin [name], male    DOB: 08-27-1925, 79 y.o.   MRN: [id number]  HPI Pt here complaining of ear pain that has been going on since early Feb. 2014.  Was seen by Urgent Care Feb. 12, 2014 for this reason.  Pt unsure of diagnosis at that time but was prescribed Ciprodex. He finished the course of this medication with some relief, however, pain came back.  Pain is described as sharp, 4 out of 10.                                         Review of Systems  He denies associated hearing loss or tinnitus. He also has had no frontal headache, facial pain, nasal purulence, otic discharge, sore throat, or dental pain.     Objective:   Physical Exam General appearance:good health ;well nourished; no acute distress or increased work of breathing is present.  No  lymphadenopathy about the head, neck, or axilla noted. Appears younger than stated age  Eyes: No conjunctival inflammation or lid edema is present.  Ears:  External ear exam shows no significant lesions or deformities.  There is increased cerumen occluding the left canal. The right tympanic membrane appears dull with localized hyeremia over ossicles There is some dried wax in the upper, anterior quadrant of the canal Nose:  External nasal examination shows no deformity or inflammation. Nasal mucosa are pink and moist without lesions or exudates. Septum deviated to the right   Oral exam: Dental hygiene is good; lips and gums are healthy appearing.There is mild oropharyngeal erythema without exudate . Clinically there is no evidence of TMJ on the right  Neck:  No deformities, masses, or tenderness noted.       Skin: Warm & dry          Assessment & Plan:  #1 right otic pain present several months. Exam shows chronic tympanic membrane changes without signs of active cellulitis.R/O Eustachian tube dysfunction  #2 left cerumen impaction  Plan: ENT evaluation

## 2013-06-04 ENCOUNTER — Ambulatory Visit: Payer: Medicare Other | Admitting: Internal Medicine

## 2013-06-04 DIAGNOSIS — H9209 Otalgia, unspecified ear: Secondary | ICD-10-CM | POA: Diagnosis not present

## 2013-07-03 ENCOUNTER — Telehealth: Payer: Self-pay | Admitting: *Deleted

## 2013-07-03 ENCOUNTER — Encounter: Payer: Self-pay | Admitting: Radiation Oncology

## 2013-07-03 NOTE — Telephone Encounter (Signed)
CALLED PATIENT TO ALTER FU VISIT DUE TO DR. MANNING BEING IN THE OR, RESCHEDULED FOR 07-04-13 AT 9:45 AM, PATIENT AGREED TO NEW TIME.

## 2013-07-03 NOTE — Progress Notes (Signed)
  Radiation Oncology         [phone] ________________________________  Name: Collin [name] MRN: [id number]  Date: 07/04/2013  DOB: [birth year]  Follow-Up Visit Note  CC: Unice Cobble, MD  Hendricks Limes, MD  Diagnosis:   79 yo man with bilateral gynecomastia  Interval Since Last Radiation:  4  weeks  Narrative:  The patient returns today for routine follow-up.  The patient experienced some mild nipple pain particularly on the right side which resolved 2 weeks after radiation.                              ALLERGIES:  is allergic to trandolapril.  Meds: Current Outpatient Prescriptions  Medication Sig Dispense Refill  . Acetaminophen 650 MG TABS Take 1 tablet (650 mg total) by mouth 4 (four) times daily as needed.  30 tablet  0  . alfuzosin (UROXATRAL) 10 MG 24 hr tablet Take 1 tablet by mouth daily.      Marland Kitchen aspirin 81 MG tablet Take 81 mg by mouth daily.        Marland Kitchen atorvastatin (LIPITOR) 10 MG tablet Take 10 mg by mouth daily.      . clopidogrel (PLAVIX) 75 MG tablet Take 1 tablet (75 mg total) by mouth daily.  90 tablet  3  . finasteride (PROSCAR) 5 MG tablet Take 5 mg by mouth daily.      . Multiple Vitamin (MULTIVITAMIN) tablet Take 1 tablet by mouth daily.        . ranitidine (ZANTAC) 150 MG tablet Take 150 mg by mouth at bedtime.      . sildenafil (VIAGRA) 100 MG tablet Take 100 mg by mouth daily as needed for erectile dysfunction. Take 1/2 - 1 tablet prn last rx 01/17/13      . valsartan (DIOVAN) 160 MG tablet TAKE 1 TABLET DAILY  90 tablet  2   No current facility-administered medications for this encounter.    Physical Findings: The patient is in no acute distress. Patient is alert and oriented.  weight is 164 lb (74.39 kg). His oral temperature is 97.8 F (36.6 C). His blood pressure is 111/72 and his pulse is 67. His respiration is 18 and oxygen saturation is 100%.  examination of the skin reveals no desquamation or hyperpigmentation of the anterior chest wall. The  patient does have some area lower hyperpigmentation and hypertrophy bilaterally..  No significant changes.  Impression:  The patient is recovering from the effects of radiation.   Plan:  He will continue to follow-up with urology.  I will look forward to following his response through their correspondence, and be happy to participate in care if clinically indicated.  I talked to the patient about what to expect in the future.  I encouraged him to call or return to the office if he has any question about his previous radiation or possible radiation effects.  He was comfortable with this plan.  _____________________________________  Sheral Apley. Tammi Klippel, M.D.

## 2013-07-04 ENCOUNTER — Ambulatory Visit
Admission: RE | Admit: 2013-07-04 | Discharge: 2013-07-04 | Disposition: A | Discharge status: Home / Self Care | Payer: Medicare Other | Source: Ambulatory Visit | Attending: Radiation Oncology | Admitting: Radiation Oncology

## 2013-07-04 ENCOUNTER — Ambulatory Visit: Payer: Medicare Other | Admitting: Radiation Oncology

## 2013-07-04 ENCOUNTER — Encounter: Payer: Self-pay | Admitting: Radiation Oncology

## 2013-07-04 VITALS — BP 111/72 | HR 67 | Temp 97.8°F | Resp 18 | Wt 164.0 lb

## 2013-07-04 DIAGNOSIS — N62 Hypertrophy of breast: Secondary | ICD-10-CM

## 2013-07-04 NOTE — Progress Notes (Signed)
Reports that pain resolved two weeks following treatment. Reports that the pain he is referring to was mostly in his right nipple. Reports that two bumps are present on his right nipple. Denies using any OTC lotion on chest wall. Denies nipple discharge. Reports only faint/slight hyperpigmentation of the skin. Reports he has been swimming this summer and his energy level has returned to normal.

## 2013-07-31 ENCOUNTER — Other Ambulatory Visit: Payer: Self-pay

## 2013-08-29 ENCOUNTER — Telehealth: Payer: Self-pay | Admitting: Cardiology

## 2013-08-29 NOTE — Telephone Encounter (Signed)
Pt states that he woke up with chest pain yesterday morning and that when he got up he felt a "pop" in the center of his chest and that he has been having chest discomfort since then. He denies SOB, nausea or sweating and states that the discomfort is only across his chest and that it does not radiate. Also, he states that his BP has been running low at around 108/57 last night at 8 pm, 103/52 at 8:42 this am and 92/52 at 10 this am and 102/50 this afternoon. The pt has been given a f/u appt with Truitt Merle, NP on 9/17 but pt wants to be seen sooner. Please advise.

## 2013-08-29 NOTE — Telephone Encounter (Signed)
New Problem  Chest pains and low BP//   10 am92/52 8:42am 103/53 8pm  108/57  Made appt for 09/11/13 w/ Gerhardt/// pt request a call back to discuss.

## 2013-08-29 NOTE — Telephone Encounter (Signed)
Suggest that we have the patient see his primary physician tomorrow

## 2013-08-29 NOTE — Telephone Encounter (Signed)
**Note De-Identified Collin [name] Obfuscation** Pt advised, he verbalized understanding. Per pt request I did not cancel appt he has with Truitt Merle, NP on 9/17, the pt states he will call to cancel if the appt is not needed.

## 2013-08-29 NOTE — Telephone Encounter (Signed)
**Note De-identified Glendon Dunwoody Obfuscation** LMTCB

## 2013-08-30 ENCOUNTER — Ambulatory Visit (INDEPENDENT_AMBULATORY_CARE_PROVIDER_SITE_OTHER): Payer: Medicare Other | Admitting: Internal Medicine

## 2013-08-30 ENCOUNTER — Encounter: Payer: Self-pay | Admitting: Internal Medicine

## 2013-08-30 ENCOUNTER — Telehealth: Payer: Self-pay | Admitting: Internal Medicine

## 2013-08-30 VITALS — BP 139/87 | HR 70 | Temp 98.4°F | Wt 162.6 lb

## 2013-08-30 DIAGNOSIS — R079 Chest pain, unspecified: Secondary | ICD-10-CM

## 2013-08-30 DIAGNOSIS — I2510 Atherosclerotic heart disease of native coronary artery without angina pectoris: Secondary | ICD-10-CM

## 2013-08-30 DIAGNOSIS — K219 Gastro-esophageal reflux disease without esophagitis: Secondary | ICD-10-CM

## 2013-08-30 LAB — TROPONIN I: Troponin I: 0.03 ng/mL (ref <0.06)

## 2013-08-30 NOTE — Progress Notes (Signed)
Subjective:    Patient ID: Collin [name], male    DOB: 1925-07-12, 79 y.o.   MRN: [id number]  HPI  Chest pain began 08/28/13 upon awakening. He described it as pain across the chest which has persisted to some extent since its onset. He also had a immediate "pop" or burst of pain in the center of his chest which lasted less than seconds. Carafate may have been of some benefit.This was started 9/3 in place of Ranitidine. He has some residual burning in the center of his chest. He questions whether eating dry cereal may be a factor onset of the symptoms.  Past medical history of CAD; DES in  2005. Cath in 2009 was stable. On Plavix . Remote history of smoking; he quit in 1993.     Review of Systems He denies significant hoarseness, dysphagia, unexplained weight loss, melena, or rectal bleeding.  No associated cough; sputum production; pleuritic pain;coughing up blood ; or radiation of the pain from the back around to the front of the chest pain No ankle swelling or calf pain present No Paroxysmal nocturnal  dyspnea .Any pain in calves which makes you stop walking (Claudication) No associated dizziness or passing out . No associated rash; color change;or temperature change in the area of the pain               Objective:   Physical Exam Gen.: Healthy and well-nourished in appearance. Alert, appropriate and cooperative throughout exam.Appears younger than stated age  Eyes: No corneal or conjunctival inflammation noted. No icterus Mouth: Oral mucosa and oropharynx reveal no lesions or exudates. Teeth in good repair. Neck: No deformities, masses, or tenderness noted.  Thyroid normal. Lungs: Normal respiratory effort; chest expands symmetrically. Lungs are clear to auscultation without rales, wheezes, or increased work of breathing. Heart: Normal rate and rhythm. Normal S1 and S2. No gallop, click, or rub. S4 with slurring at  LSB. Abdomen: Bowel sounds normal; abdomen soft and  nontender. No masses, organomegaly or hernias noted.                               Musculoskeletal/extremities: No deformity or scoliosis noted of  the thoracic or lumbar spine.  No clubbing, cyanosis, edema, or significant extremity  deformity noted. Range of motion normal .Tone & strength  Normal. Joints  reveal mild  DJD DIP changes. Nail health good. Able to lie down & sit up w/o help. Negative Homan's bilaterally Vascular: Carotid, radial artery, dorsalis pedis and  posterior tibial pulses are full and equal. No bruits present. Neurologic: Alert and oriented x3.      Skin: Intact without suspicious lesions or rashes. Lymph: No cervical, axillary lymphadenopathy present. Psych: Mood and affect are normal. Normally interactive                                                                                        Assessment & Plan:  #1 chest pain in the context of coronary disease. History and exam suggest reflux. EKG reveals improvement in the ST-T changes in lead 3 and slight decrease in the  voltage in V1 compared to 02/06/13.  Plavix therapy precludes PPI therapy.  See recommendations and orders.

## 2013-08-30 NOTE — Telephone Encounter (Signed)
Spoke with pt regarding symptoms. He denies any shortness of breath or numbness in arm. Pt states that he does have pain in his left arm but it has been there for months. Pt states he spoke with Cardiology and they seemed "unconcerned" with the symptoms he presented with. I had advised pt that since pain has been lingering for so long and the pain in his left arm he should be seen by ER just to make sure all is well with his heart. Pt states that he is only feeling a discomfort at this time and would rather keep his appt with [facility]. States if worsens he will go to ER.

## 2013-08-30 NOTE — Telephone Encounter (Signed)
Patient Information:  Caller Name: Dejour  Phone: [phone]  Patient: Collin [name],   Gender: Male  DOB: 1925/10/14  Age: 79 Years  PCP: Unice Cobble  Office Follow Up:  Does the office need to follow up with this patient?: No  Instructions For The Office: N/A  RN Note:  He has appointment scheduled for 11:00 am -08/30/13.  Symptoms  Reason For Call & Symptoms: Calling about waking up with chest pain on 08/28/13. He had a sharp pain with a "popping"  senstation and discomfort in chest since. Hx of soreness in [place] (Feb 2014) and checked in UC - prescribed  Carafate and it helped. Restarted Carafate QID on 08/28/13 and is helping. Called Cardiologist on 08/29/13 and he advised he be seen by PCP. He does drink coffee 4-5 cups daily. He normally takes Zantac in the mornings but has not taken it since starting on Carafate. Pain Intermittant- not effected by movement or deep breathing. BP = 99/50 this morning.  Reviewed Health History In EMR: Yes  Reviewed Medications In EMR: Yes  Reviewed Allergies In EMR: Yes  Reviewed Surgeries / Procedures: Yes  Date of Onset of Symptoms: 08/28/2013  Treatments Tried: Carafate QID- last took at 0730 08/30/13  Treatments Tried Worked: No  Guideline(s) Used:  Chest Pain  Disposition Per Guideline:   See Today in Office  Reason For Disposition Reached:   All other patients with chest pain  Advice Given:  Call Back If:  Severe chest pain  Constant chest pain lasting longer than 5 minutes  Difficulty breathing  Fever  You become worse.  Patient Refused Recommendation:  Patient Will Make Own Appointment  Appointment already scheduled at 11am with Dr. Linna Darner.

## 2013-08-30 NOTE — Patient Instructions (Addendum)
Reflux of gastric acid may be asymptomatic as this may occur mainly during sleep.The triggers for reflux  include stress; the "aspirin family" ; alcohol; peppermint; and caffeine (coffee, tea, cola, and chocolate). The aspirin family would include aspirin and the nonsteroidal agents such as ibuprofen &  Naproxen. Tylenol would not cause reflux. If having symptoms ; food & drink should be avoided for @ least 2 hours before going to bed.  Take the ranitidine 30 minutes before breakfast and evening meal.

## 2013-09-01 ENCOUNTER — Other Ambulatory Visit: Payer: Self-pay | Admitting: Cardiology

## 2013-09-03 ENCOUNTER — Encounter: Payer: Self-pay | Admitting: Cardiology

## 2013-09-10 DIAGNOSIS — Z23 Encounter for immunization: Secondary | ICD-10-CM | POA: Diagnosis not present

## 2013-09-11 ENCOUNTER — Ambulatory Visit: Payer: Medicare Other | Admitting: Nurse Practitioner

## 2013-10-03 ENCOUNTER — Encounter: Payer: Self-pay | Admitting: Internal Medicine

## 2013-10-09 DIAGNOSIS — Z947 Corneal transplant status: Secondary | ICD-10-CM | POA: Diagnosis not present

## 2013-10-09 DIAGNOSIS — Z961 Presence of intraocular lens: Secondary | ICD-10-CM | POA: Diagnosis not present

## 2013-11-04 DIAGNOSIS — Z8551 Personal history of malignant neoplasm of bladder: Secondary | ICD-10-CM | POA: Diagnosis not present

## 2013-11-04 DIAGNOSIS — N400 Enlarged prostate without lower urinary tract symptoms: Secondary | ICD-10-CM | POA: Diagnosis not present

## 2013-11-04 DIAGNOSIS — R1032 Left lower quadrant pain: Secondary | ICD-10-CM | POA: Diagnosis not present

## 2013-11-13 DIAGNOSIS — R1032 Left lower quadrant pain: Secondary | ICD-10-CM | POA: Diagnosis not present

## 2013-11-13 DIAGNOSIS — R935 Abnormal findings on diagnostic imaging of other abdominal regions, including retroperitoneum: Secondary | ICD-10-CM | POA: Diagnosis not present

## 2013-11-13 DIAGNOSIS — Z8551 Personal history of malignant neoplasm of bladder: Secondary | ICD-10-CM | POA: Diagnosis not present

## 2013-12-22 ENCOUNTER — Emergency Department (HOSPITAL_COMMUNITY): Payer: Medicare Other

## 2013-12-22 ENCOUNTER — Other Ambulatory Visit: Payer: Self-pay

## 2013-12-22 ENCOUNTER — Emergency Department (HOSPITAL_COMMUNITY)
Admission: EM | Admit: 2013-12-22 | Discharge: 2013-12-22 | Discharge status: Against Medical Advice | Payer: Medicare Other | Attending: Emergency Medicine | Admitting: Emergency Medicine

## 2013-12-22 DIAGNOSIS — Z8669 Personal history of other diseases of the nervous system and sense organs: Secondary | ICD-10-CM | POA: Insufficient documentation

## 2013-12-22 DIAGNOSIS — I252 Old myocardial infarction: Secondary | ICD-10-CM | POA: Diagnosis not present

## 2013-12-22 DIAGNOSIS — R197 Diarrhea, unspecified: Secondary | ICD-10-CM | POA: Diagnosis not present

## 2013-12-22 DIAGNOSIS — Z862 Personal history of diseases of the blood and blood-forming organs and certain disorders involving the immune mechanism: Secondary | ICD-10-CM | POA: Insufficient documentation

## 2013-12-22 DIAGNOSIS — I2510 Atherosclerotic heart disease of native coronary artery without angina pectoris: Secondary | ICD-10-CM | POA: Diagnosis not present

## 2013-12-22 DIAGNOSIS — R1012 Left upper quadrant pain: Secondary | ICD-10-CM | POA: Diagnosis not present

## 2013-12-22 DIAGNOSIS — R1013 Epigastric pain: Secondary | ICD-10-CM | POA: Diagnosis not present

## 2013-12-22 DIAGNOSIS — N400 Enlarged prostate without lower urinary tract symptoms: Secondary | ICD-10-CM | POA: Insufficient documentation

## 2013-12-22 DIAGNOSIS — Z9889 Other specified postprocedural states: Secondary | ICD-10-CM | POA: Diagnosis not present

## 2013-12-22 DIAGNOSIS — R079 Chest pain, unspecified: Secondary | ICD-10-CM | POA: Diagnosis not present

## 2013-12-22 DIAGNOSIS — K7689 Other specified diseases of liver: Secondary | ICD-10-CM | POA: Diagnosis not present

## 2013-12-22 DIAGNOSIS — J4489 Other specified chronic obstructive pulmonary disease: Secondary | ICD-10-CM | POA: Insufficient documentation

## 2013-12-22 DIAGNOSIS — R109 Unspecified abdominal pain: Secondary | ICD-10-CM

## 2013-12-22 DIAGNOSIS — K219 Gastro-esophageal reflux disease without esophagitis: Secondary | ICD-10-CM | POA: Diagnosis not present

## 2013-12-22 DIAGNOSIS — E785 Hyperlipidemia, unspecified: Secondary | ICD-10-CM | POA: Insufficient documentation

## 2013-12-22 DIAGNOSIS — I10 Essential (primary) hypertension: Secondary | ICD-10-CM | POA: Insufficient documentation

## 2013-12-22 DIAGNOSIS — J449 Chronic obstructive pulmonary disease, unspecified: Secondary | ICD-10-CM | POA: Diagnosis not present

## 2013-12-22 DIAGNOSIS — R6889 Other general symptoms and signs: Secondary | ICD-10-CM | POA: Diagnosis not present

## 2013-12-22 DIAGNOSIS — Z85828 Personal history of other malignant neoplasm of skin: Secondary | ICD-10-CM | POA: Diagnosis not present

## 2013-12-22 DIAGNOSIS — Z8551 Personal history of malignant neoplasm of bladder: Secondary | ICD-10-CM | POA: Insufficient documentation

## 2013-12-22 DIAGNOSIS — Z7902 Long term (current) use of antithrombotics/antiplatelets: Secondary | ICD-10-CM | POA: Diagnosis not present

## 2013-12-22 DIAGNOSIS — Z8619 Personal history of other infectious and parasitic diseases: Secondary | ICD-10-CM | POA: Insufficient documentation

## 2013-12-22 DIAGNOSIS — Z7982 Long term (current) use of aspirin: Secondary | ICD-10-CM | POA: Insufficient documentation

## 2013-12-22 DIAGNOSIS — R0789 Other chest pain: Secondary | ICD-10-CM | POA: Diagnosis not present

## 2013-12-22 DIAGNOSIS — M109 Gout, unspecified: Secondary | ICD-10-CM | POA: Insufficient documentation

## 2013-12-22 DIAGNOSIS — J40 Bronchitis, not specified as acute or chronic: Secondary | ICD-10-CM | POA: Diagnosis not present

## 2013-12-22 DIAGNOSIS — Z79899 Other long term (current) drug therapy: Secondary | ICD-10-CM | POA: Insufficient documentation

## 2013-12-22 DIAGNOSIS — N281 Cyst of kidney, acquired: Secondary | ICD-10-CM | POA: Diagnosis not present

## 2013-12-22 LAB — CBC WITH DIFFERENTIAL/PLATELET
Eosinophils Relative: 3 % (ref 0–5)
HCT: 42.4 % (ref 39.0–52.0)
Lymphocytes Relative: 15 % (ref 12–46)
Lymphs Abs: 1.1 10*3/uL (ref 0.7–4.0)
MCV: 91.4 fL (ref 78.0–100.0)
Monocytes Absolute: 0.4 10*3/uL (ref 0.1–1.0)
Neutro Abs: 5.6 10*3/uL (ref 1.7–7.7)
Platelets: 107 10*3/uL — ABNORMAL LOW (ref 150–400)
RBC: 4.64 MIL/uL (ref 4.22–5.81)
RDW: 13 % (ref 11.5–15.5)
WBC: 7.3 10*3/uL (ref 4.0–10.5)

## 2013-12-22 LAB — COMPREHENSIVE METABOLIC PANEL
ALT: 56 U/L — ABNORMAL HIGH (ref 0–53)
Alkaline Phosphatase: 72 U/L (ref 39–117)
BUN: 26 mg/dL — ABNORMAL HIGH (ref 6–23)
CO2: 26 mEq/L (ref 19–32)
Calcium: 8.6 mg/dL (ref 8.4–10.5)
Chloride: 102 mEq/L (ref 96–112)
GFR calc Af Amer: 52 mL/min — ABNORMAL LOW (ref >90)
GFR calc non Af Amer: 45 mL/min — ABNORMAL LOW (ref >90)
Glucose, Bld: 101 mg/dL — ABNORMAL HIGH (ref 70–99)
Sodium: 137 mEq/L (ref 135–145)
Total Bilirubin: 1.1 mg/dL (ref 0.3–1.2)

## 2013-12-22 LAB — URINALYSIS W MICROSCOPIC + REFLEX CULTURE
Bilirubin Urine: NEGATIVE
Hgb urine dipstick: NEGATIVE
Nitrite: NEGATIVE
Protein, ur: NEGATIVE mg/dL
Specific Gravity, Urine: 1.015 (ref 1.005–1.030)
pH: 5.5 (ref 5.0–8.0)

## 2013-12-22 LAB — LIPASE, BLOOD: Lipase: 67 U/L — ABNORMAL HIGH (ref 11–59)

## 2013-12-22 LAB — TROPONIN I: Troponin I: <0.3 ng/mL (ref <0.30)

## 2013-12-22 MED ORDER — FAMOTIDINE IN NACL 20-0.9 MG/50ML-% IV SOLN
20.0000 mg | Freq: Once | INTRAVENOUS | Status: AC
  Administered 2013-12-22: 20 mg via INTRAVENOUS
  Filled 2013-12-22: qty 50

## 2013-12-22 MED ORDER — MORPHINE SULFATE 4 MG/ML IJ SOLN
4.0000 mg | INTRAMUSCULAR | Status: DC | PRN
  Administered 2013-12-22: 4 mg via INTRAVENOUS
  Filled 2013-12-22: qty 1

## 2013-12-22 MED ORDER — GI COCKTAIL ~~LOC~~
30.0000 mL | Freq: Once | ORAL | Status: AC
  Administered 2013-12-22: 30 mL via ORAL
  Filled 2013-12-22: qty 30

## 2013-12-22 MED ORDER — ONDANSETRON HCL 4 MG/2ML IJ SOLN
4.0000 mg | INTRAMUSCULAR | Status: DC | PRN
  Administered 2013-12-22: 4 mg via INTRAVENOUS
  Filled 2013-12-22: qty 2

## 2013-12-22 MED ORDER — SODIUM CHLORIDE 0.9 % IV SOLN
INTRAVENOUS | Status: DC
  Administered 2013-12-22: 12:00:00 via INTRAVENOUS

## 2013-12-22 MED ORDER — NITROGLYCERIN 0.4 MG SL SUBL
0.4000 mg | SUBLINGUAL_TABLET | SUBLINGUAL | Status: DC | PRN
  Administered 2013-12-22: 0.4 mg via SUBLINGUAL
  Filled 2013-12-22: qty 25

## 2013-12-22 MED ORDER — IOHEXOL 300 MG/ML  SOLN
80.0000 mL | Freq: Once | INTRAMUSCULAR | Status: AC | PRN
  Administered 2013-12-22: 80 mL via INTRAVENOUS

## 2013-12-22 NOTE — ED Notes (Signed)
Patient transported to X-ray 

## 2013-12-22 NOTE — ED Notes (Signed)
Patient was at home and started having epigastric pain.  The patient also said he had two instances of diarrhea within 30 minutes.  He has a cardiac history with stent placement and is concerned so he wants to be evaluated.  EMS gave him 324mg  of Aspirin.

## 2013-12-22 NOTE — ED Notes (Signed)
Patient transported to CT 

## 2013-12-22 NOTE — ED Notes (Signed)
Patient back from x-ray 

## 2013-12-22 NOTE — ED Notes (Signed)
Patient is alert and orientedx4.  Patient was explained discharge instructions and they understood them with no questions.   

## 2013-12-22 NOTE — ED Notes (Signed)
Patient is resting comfortably. 

## 2013-12-22 NOTE — ED Provider Notes (Signed)
CSN: [id number]     Arrival date & time 12/22/13  1132 History   First MD Initiated Contact with Patient 12/22/13 1137     Chief Complaint  Patient presents with  . Abdominal Pain    Epigastric pain    HPI Pt was seen at 1140.  Per pt, c/o gradual onset and persistence of constant upper abd "pain" that began this morning approx 0800.  Has been associated with multiple intermittent episodes of diarrhea.  Describes the abd pain as "cramping," located in his upper mid-abd/lower mid-chest areas.  EMS gave ASA en route. Denies N/V, no fevers, no back pain, no rash, no CP/SOB, no black or blood in stools.       Cards: Dr. Ron Parker Past Medical History  Diagnosis Date  . CAD (coronary artery disease) 2005    DES January 2 005, residual disease  / Myoview  . Anemia   . GERD (gastroesophageal reflux disease)   . COPD (chronic obstructive pulmonary disease)   . HTN (hypertension)   . Hyperlipidemia   . Low back pain syndrome   . Encounter for long-term (current) use of other medications   . Dysfunction of eustachian tube   . Reactive airway disease   . Thrombocytopenia   . Bladder cancer   . Hx of skin cancer, basal cell   . Ejection fraction     EF 55%, echo, January, 2009  . Pulmonary nodule     Chest CT, followed  . Carotid artery disease     Doppler, September, 2010, normal, no carotid artery disease  . Gynecomastia     b/l  . BPH (benign prostatic hyperplasia)   . Organic impotence   . Enlarged prostate   . 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 Myocardial infarction     hx acute  . Arthritis     gout  . Hypercholesterolemia   . Carcinoma, renal cell   . 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  . Allergy   . Nocturia   . Hx of cystoscopy 11/02/12    negative  . Staph infection 04/1997   Past Surgical History  Procedure Laterality Date  . Osteomyelitis      staph aureus  . Nephrectomy  02/2001    left kidney, ureter  . Bladder tumors   2003, 2004, 01/2009    removed  . Cholecystectomy  07/2003  . Ureterectomy  2003    for cancer  . Corneal transplant  04/2002    sutured, right  . Corneal transplant  10/2008    left  . Cardiac catheterization    . Back surgery  02/1996   Family History  Problem Relation Age of Onset  . Heart failure Mother 62  . Diabetes Mother   . Coronary artery disease Brother   . Diabetes Brother   . Diabetes Sister   . Diabetes Sister    History  Substance Use Topics  . Smoking status: Former Smoker    Types: Pipe    Quit date: 12/27/1991  . Smokeless tobacco: Not on file     Comment: Quit 20-30 years ago as of 2012  . Alcohol Use: No    Review of Systems ROS: Statement: All systems negative except as marked or noted in the HPI; Constitutional: Negative for fever and chills. ; ; Eyes: Negative for eye pain, redness and discharge. ; ; ENMT: Negative for ear pain, hoarseness, nasal congestion, sinus pressure and sore  throat. ; ; Cardiovascular: Negative for chest pain, palpitations, diaphoresis, dyspnea and peripheral edema. ; ; Respiratory: Negative for cough, wheezing and stridor. ; ; Gastrointestinal: +abd pain, diarrhea. Negative for nausea, vomiting, blood in stool, hematemesis, jaundice and rectal bleeding. . ; ; Genitourinary: Negative for dysuria, flank pain and hematuria. ; ; Musculoskeletal: Negative for back pain and neck pain. Negative for swelling and trauma.; ; Skin: Negative for pruritus, rash, abrasions, blisters, bruising and skin lesion.; ; Neuro: Negative for headache, lightheadedness and neck stiffness. Negative for weakness, altered level of consciousness , altered mental status, extremity weakness, paresthesias, involuntary movement, seizure and syncope.       Allergies  Trandolapril  Home Medications   Current Outpatient Rx  Name  Route  Sig  Dispense  Refill  . Acetaminophen 650 MG TABS   Oral   Take 1 tablet (650 mg total) by mouth 4 (four) times daily as  needed.   30 tablet   0   . alfuzosin (UROXATRAL) 10 MG 24 hr tablet   Oral   Take 1 tablet by mouth daily.         Marland Kitchen aspirin 81 MG tablet   Oral   Take 81 mg by mouth daily.           Marland Kitchen atorvastatin (LIPITOR) 10 MG tablet   Oral   Take 10 mg by mouth daily.         . clopidogrel (PLAVIX) 75 MG tablet      TAKE 1 TABLET DAILY   90 tablet   2   . finasteride (PROSCAR) 5 MG tablet   Oral   Take 5 mg by mouth daily.         . Multiple Vitamin (MULTIVITAMIN) tablet   Oral   Take 1 tablet by mouth daily.           . ranitidine (ZANTAC) 150 MG tablet   Oral   Take 150 mg by mouth at bedtime.         . sildenafil (VIAGRA) 100 MG tablet   Oral   Take 100 mg by mouth daily as needed for erectile dysfunction. Take 1/2 - 1 tablet prn last rx 01/17/13         . valsartan (DIOVAN) 160 MG tablet      TAKE 1 TABLET DAILY   90 tablet   2     Pt will need in 3 to 4 weeks. Thanks.    BP 141/75  Temp(Src) 97.9 F (36.6 C) (Oral)  Resp 13  Ht 5\' 8"  (1.727 m)  Wt 165 lb (74.844 kg)  BMI 25.09 kg/m2  SpO2 100% Physical Exam 1145: Physical examination:  Nursing notes reviewed; Vital signs and O2 SAT reviewed;  Constitutional: Well developed, Well nourished, Well hydrated, In no acute distress; Head:  Normocephalic, atraumatic; Eyes: EOMI, PERRL, No scleral icterus; ENMT: Mouth and pharynx normal, Mucous membranes moist; Neck: Supple, Full range of motion, No lymphadenopathy; Cardiovascular: Regular rate and rhythm, No gallop; Respiratory: Breath sounds clear & equal bilaterally, No wheezes.  Speaking full sentences with ease, Normal respiratory effort/excursion; Chest: Nontender, Movement normal; Abdomen: Soft, +mid-epigastric, LUQ tender to palp. No rebound or guarding. Nondistended, Normal bowel sounds; Genitourinary: No CVA tenderness; Extremities: Pulses normal, No tenderness, No edema, No calf edema or asymmetry.; Neuro: AA&Ox3, Major CN grossly intact.  Speech  clear. No gross focal motor or sensory deficits in extremities.; Skin: Color normal, Warm, Dry.   ED Course  Procedures  EKG Interpretation    Date/Time:  Sunday December 22 2013 11:40:45 EST Ventricular Rate:  68 PR Interval:  158 QRS Duration: 92 QT Interval:  391 QTC Calculation: 416 R Axis:   70 Text Interpretation:  Sinus rhythm Baseline wander Inferior infarct, old When compared with ECG of 02/06/2013 No significant change was found Confirmed by [facility]  MD, Nunzio Cory [phone]) on 12/22/2013 1:22:28 PM            MDM  MDM Reviewed: previous chart, nursing note and vitals Reviewed previous: labs and ECG Interpretation: labs, ECG, CT scan and x-ray    Date: 12/22/2013 repeat  Rate: 62  Rhythm: normal sinus rhythm  QRS Axis: normal  Intervals: normal  ST/T Wave abnormalities: normal  Conduction Disutrbances:none  Narrative Interpretation: inferior infarct, old  Old EKG Reviewed: unchanged; no significant changes from previous EKG today, and dated 02/06/2013     Results for orders placed during the hospital encounter of 12/22/13  URINALYSIS W MICROSCOPIC + REFLEX CULTURE      Result Value Range   Color, Urine YELLOW  YELLOW   APPearance CLEAR  CLEAR   Specific Gravity, Urine 1.015  1.005 - 1.030   pH 5.5  5.0 - 8.0   Glucose, UA NEGATIVE  NEGATIVE mg/dL   Hgb urine dipstick NEGATIVE  NEGATIVE   Bilirubin Urine NEGATIVE  NEGATIVE   Ketones, ur NEGATIVE  NEGATIVE mg/dL   Protein, ur NEGATIVE  NEGATIVE mg/dL   Urobilinogen, UA 0.2  0.0 - 1.0 mg/dL   Nitrite NEGATIVE  NEGATIVE   Leukocytes, UA NEGATIVE  NEGATIVE   Squamous Epithelial / LPF RARE  RARE  CBC WITH DIFFERENTIAL      Result Value Range   WBC 7.3  4.0 - 10.5 K/uL   RBC 4.64  4.22 - 5.81 MIL/uL   Hemoglobin 15.0  13.0 - 17.0 g/dL   HCT 42.4  39.0 - 52.0 %   MCV 91.4  78.0 - 100.0 fL   MCH 32.3  26.0 - 34.0 pg   MCHC 35.4  30.0 - 36.0 g/dL   RDW 13.0  11.5 - 15.5 %   Platelets 107 (*) 150 -  400 K/uL   Neutrophils Relative % 76  43 - 77 %   Neutro Abs 5.6  1.7 - 7.7 K/uL   Lymphocytes Relative 15  12 - 46 %   Lymphs Abs 1.1  0.7 - 4.0 K/uL   Monocytes Relative 6  3 - 12 %   Monocytes Absolute 0.4  0.1 - 1.0 K/uL   Eosinophils Relative 3  0 - 5 %   Eosinophils Absolute 0.2  0.0 - 0.7 K/uL   Basophils Relative 0  0 - 1 %   Basophils Absolute 0.0  0.0 - 0.1 K/uL  COMPREHENSIVE METABOLIC PANEL      Result Value Range   Sodium 137  135 - 145 mEq/L   Potassium 5.0  3.5 - 5.1 mEq/L   Chloride 102  96 - 112 mEq/L   CO2 26  19 - 32 mEq/L   Glucose, Bld 101 (*) 70 - 99 mg/dL   BUN 26 (*) 6 - 23 mg/dL   Creatinine, Ser 1.37 (*) 0.50 - 1.35 mg/dL   Calcium 8.6  8.4 - 10.5 mg/dL   Total Protein 7.0  6.0 - 8.3 g/dL   Albumin 4.1  3.5 - 5.2 g/dL   AST 52 (*) 0 - 37 U/L   ALT 56 (*) 0 - 53 U/L  Alkaline Phosphatase 72  39 - 117 U/L   Total Bilirubin 1.1  0.3 - 1.2 mg/dL   GFR calc non Af Amer 45 (*) >90 mL/min   GFR calc Af Amer 52 (*) >90 mL/min  LIPASE, BLOOD      Result Value Range   Lipase 67 (*) 11 - 59 U/L  LACTIC ACID, PLASMA      Result Value Range   Lactic Acid, Venous 2.0  0.5 - 2.2 mmol/L  TROPONIN I      Result Value Range   Troponin I <0.30  <0.30 ng/mL   Dg Chest 2 View 12/22/2013   CLINICAL DATA:  Central chest pain for 1 day, history coronary artery disease, COPD, hypertension, cancer of the skin, bladder and kidneys, MI  EXAM: CHEST  2 VIEW  COMPARISON:  11/04/2009  FINDINGS: Normal heart size and pulmonary vascularity.  Tortuous thoracic aorta.  Questionable left nipple shadow.  Bronchitic changes without infiltrate, pleural effusion or pneumothorax.  Bones demineralized.  IMPRESSION: Bronchitic changes with questionable left nipple shadow; repeat PA chest radiograph with nipple markers recommended to exclude pulmonary nodule.   Electronically Signed   By: Lavonia Dana M.D.   On: 12/22/2013 12:48   Ct Abdomen Pelvis W Contrast 12/22/2013   CLINICAL DATA:   Sharp left epigastric pain, nausea, diarrhea, past history of nephrectomy, hyperlipidemia, coronary artery disease post MI, hypertension, bladder cancer  EXAM: CT ABDOMEN AND PELVIS WITH CONTRAST  TECHNIQUE: Multidetector CT imaging of the abdomen and pelvis was performed using the standard protocol following bolus administration of intravenous contrast. Sagittal and coronal MPR images reconstructed from axial data set.  CONTRAST:  54mL OMNIPAQUE IOHEXOL 300 MG/ML SOLN. Dilute oral contrast.  COMPARISON:  11/13/2013  FINDINGS: Bibasilar atelectasis.  Post left nephrectomy and cholecystectomy.  Tiny cyst lateral segment left lobe liver 7 mm diameter image 24 unchanged.  Small cyst upper pole right kidney image 29, 2.1 x 1.9 cm.  Liver, spleen, pancreas, right kidney, and adrenal glands otherwise normal appearance.  Stomach and bowel loops grossly unremarkable.  Appendix not visualized.  Mild scattered atherosclerotic calcifications.  Unremarkable bladder, ureters, and prostate gland.  No mass, adenopathy, free fluid, or inflammatory process.  No acute osseous findings.  IMPRESSION: Tiny hepatic and right renal cysts.  Post left nephrectomy and cholecystectomy.  Minimal bibasilar atelectasis.  No acute intra-abdominal or intrapelvic process.   Electronically Signed   By: Lavonia Dana M.D.   On: 12/22/2013 13:45    1830:  Abd workup reassuring, including EKG and troponin x2 today without change. Pt given ntg SL and GI cocktail with relief of symptoms.  HEART score 4/TIMI 4 by hx; will observation admit. D/W pt regarding same and that I recommend observation admission for further evaluation.  Pt refuses admission.  I encouraged pt to stay, continues to refuse.  Pt makes his own medical decisions.  Risks of AMA explained to pt, including, but not limited to:  stroke, heart attack, cardiac arrythmia ("irregular heart rate/beat"), "passing out," temporary and/or permanent disability, death.  Pt verb understanding and  continue to refuse admission, understanding the consequences of his decision.  I encouraged pt to follow up with his PMD and Cards MD tomorrow and return to the ED immediately if symptoms return, or for any other concerns.  Pt verb understanding, agreeable.    Alfonzo Feller, DO 12/25/13 1455

## 2013-12-31 ENCOUNTER — Encounter: Payer: Self-pay | Admitting: Internal Medicine

## 2013-12-31 ENCOUNTER — Ambulatory Visit (INDEPENDENT_AMBULATORY_CARE_PROVIDER_SITE_OTHER): Payer: Medicare Other | Admitting: Internal Medicine

## 2013-12-31 VITALS — BP 120/64 | HR 54 | Temp 97.7°F | Resp 16 | Wt 170.0 lb

## 2013-12-31 DIAGNOSIS — R918 Other nonspecific abnormal finding of lung field: Secondary | ICD-10-CM

## 2013-12-31 DIAGNOSIS — R9389 Abnormal findings on diagnostic imaging of other specified body structures: Secondary | ICD-10-CM | POA: Insufficient documentation

## 2013-12-31 DIAGNOSIS — R935 Abnormal findings on diagnostic imaging of other abdominal regions, including retroperitoneum: Secondary | ICD-10-CM | POA: Diagnosis not present

## 2013-12-31 DIAGNOSIS — K219 Gastro-esophageal reflux disease without esophagitis: Secondary | ICD-10-CM

## 2013-12-31 NOTE — Assessment & Plan Note (Signed)
Repeat PA chest film with nipple marker

## 2013-12-31 NOTE — Assessment & Plan Note (Signed)
No further monitor indicated

## 2013-12-31 NOTE — Progress Notes (Signed)
   Subjective:    Patient ID: Collin [name], male    DOB: [birth year], 80 y.o.   MRN: [id number]  HPI  His 12/22/13 emergency room visit records were reviewed. He developed upper abdominal discomfort in the context of having taken Imodium for diarrhea. The diarrhea had occurred after he taken some sinus medications the evening before.  He had stopped taking his evening dose of ranitidine approximate month ago; he is unsure why.  The extensive evaluation included labs and imaging.  An incidental finding was a possible nodule; followup films with nipple markers recommended.  His platelet count was 107,000; creatinine 1.37; GFR 45; AST 52; ALT 56; and lipase minimally elevated.  He is on Plavix and his cardiologist.  A CT scan revealed small hepatic and renal cysts. He's had an nephrectomy on the left  EKG revealed no acute process and cardiac enzymes were negative  He began to sleep with a wedge , has taken antacid and cut down his coffee. He had one recurrence 12/30 lasting less than 1 minute.    Review of Systems He denies dyspepsia, dysphasia, unexplained weight loss, abdominal pain, melena, rectal bleeding, or small caliber stools. He denies epistaxis, hemoptysis, or hematuria.He has no abnormal bruising or bleeding. He has no difficulty stopping bleeding with injury.     Objective:   Physical Exam  General appearance is one of good health and nourishment w/o distress.  Eyes: No conjunctival inflammation or scleral icterus is present.  Oral exam: Dental hygiene is good; lips and gums are healthy appearing.There is no oropharyngeal erythema or exudate noted.   Heart:  Normal rate and regular rhythm. S1 and S2 normal without gallop, murmur, click, rub or other extra sounds     Lungs:Chest clear to auscultation; no wheezes, rhonchi,rales ,or rubs present.No increased work of breathing.   Abdomen: bowel sounds normal, soft and non-tender without masses, organomegaly or hernias  noted.  No guarding or rebound . No tenderness over the flanks to percussion  Musculoskeletal: Able to lie flat and sit up without help. Negative straight leg raising bilaterally. Gait normal  Skin:Warm & dry.  Intact without suspicious lesions or rashes ; no jaundice or tenting  Lymphatic: No lymphadenopathy is noted about the head, neck, axilla areas.                Assessment & Plan:  #1 abdominal pain; probable GERD #2 ? Pulmonary nodule #3 renal & hepatic cysts #4 TCP in context Plavix Rx See orders

## 2013-12-31 NOTE — Progress Notes (Signed)
Pre-visit discussion using our clinic review tool. No additional management support is needed unless otherwise documented below in the visit note.  

## 2013-12-31 NOTE — Assessment & Plan Note (Signed)
Antireflux measures  Ranitidine 150 mg twice a day

## 2013-12-31 NOTE — Patient Instructions (Signed)
Reflux of gastric acid may be asymptomatic as this may occur mainly during sleep.The triggers for reflux  include stress; the "aspirin family" ; alcohol; peppermint; and caffeine (coffee, tea, cola, and chocolate). The aspirin family would include aspirin and the nonsteroidal agents such as ibuprofen &  Naproxen. Tylenol would not cause reflux. If having symptoms ; food & drink should be avoided for @ least 2 hours before going to bed. Take the ranitidine 30 minutes before breakfast and evening meal.   Order for x-rays entered into  the computer; these will be performed at [place]. across from [facility]. No appointment is necessary.

## 2014-01-01 ENCOUNTER — Ambulatory Visit (INDEPENDENT_AMBULATORY_CARE_PROVIDER_SITE_OTHER)
Admission: RE | Admit: 2014-01-01 | Discharge: 2014-01-01 | Disposition: A | Discharge status: Home / Self Care | Payer: Medicare Other | Source: Ambulatory Visit | Attending: Internal Medicine | Admitting: Internal Medicine

## 2014-01-01 DIAGNOSIS — R918 Other nonspecific abnormal finding of lung field: Secondary | ICD-10-CM | POA: Diagnosis not present

## 2014-01-01 DIAGNOSIS — J42 Unspecified chronic bronchitis: Secondary | ICD-10-CM | POA: Diagnosis not present

## 2014-01-01 DIAGNOSIS — R9389 Abnormal findings on diagnostic imaging of other specified body structures: Secondary | ICD-10-CM

## 2014-01-24 ENCOUNTER — Encounter: Payer: Self-pay | Admitting: Cardiology

## 2014-01-27 ENCOUNTER — Ambulatory Visit (INDEPENDENT_AMBULATORY_CARE_PROVIDER_SITE_OTHER): Payer: Medicare Other | Admitting: Cardiology

## 2014-01-27 ENCOUNTER — Encounter: Payer: Self-pay | Admitting: Cardiology

## 2014-01-27 VITALS — BP 128/73 | HR 65 | Ht 68.0 in | Wt 171.0 lb

## 2014-01-27 DIAGNOSIS — R918 Other nonspecific abnormal finding of lung field: Secondary | ICD-10-CM | POA: Diagnosis not present

## 2014-01-27 DIAGNOSIS — E785 Hyperlipidemia, unspecified: Secondary | ICD-10-CM

## 2014-01-27 DIAGNOSIS — I2510 Atherosclerotic heart disease of native coronary artery without angina pectoris: Secondary | ICD-10-CM | POA: Diagnosis not present

## 2014-01-27 DIAGNOSIS — R9389 Abnormal findings on diagnostic imaging of other specified body structures: Secondary | ICD-10-CM

## 2014-01-27 LAB — HEPATIC FUNCTION PANEL
ALBUMIN: 4.1 g/dL (ref 3.5–5.2)
ALK PHOS: 58 U/L (ref 39–117)
ALT: 35 U/L (ref 0–53)
AST: 25 U/L (ref 0–37)
Bilirubin, Direct: 0.2 mg/dL (ref 0.0–0.3)
TOTAL PROTEIN: 6.7 g/dL (ref 6.0–8.3)
Total Bilirubin: 1.9 mg/dL — ABNORMAL HIGH (ref 0.3–1.2)

## 2014-01-27 LAB — LDL CHOLESTEROL, DIRECT: Direct LDL: 48.9 mg/dL

## 2014-01-27 LAB — LIPID PANEL
CHOLESTEROL: 143 mg/dL (ref 0–200)
HDL: 37.2 mg/dL — ABNORMAL LOW (ref >39.00)
TRIGLYCERIDES: 424 mg/dL — AB (ref 0.0–149.0)
Total CHOL/HDL Ratio: 4
VLDL: 84.8 mg/dL — ABNORMAL HIGH (ref 0.0–40.0)

## 2014-01-27 NOTE — Assessment & Plan Note (Signed)
Fasting lipid will be checked today. He is on 10 mg of atorvastatin.

## 2014-01-27 NOTE — Assessment & Plan Note (Signed)
Question of an abnormal chest x-ray recently. Followup January, 2015 revealed no significant abnormality. No further workup.

## 2014-01-27 NOTE — Patient Instructions (Signed)
**Note De-identified Collin [name] Obfuscation** Your physician recommends that you continue on your current medications as directed. Please refer to the Current Medication list given to you today.  Your physician recommends that you return for lab work in: today  Your physician wants you to follow-up in: 1 year. You will receive a reminder letter in the mail two months in advance. If you don't receive a letter, please call our office to schedule the follow-up appointment.    

## 2014-01-27 NOTE — Progress Notes (Signed)
HPI  Patient is seen to followup coronary disease. His last intervention was 2005. His last catheterization was 2009. His ejection fraction is normal. He has not been having chest pain. He did have an emergency room visit in December, 2014. Symptoms were most probably from GERD. There was question of an abnormal chest x-ray but followup showed that there was no definite abnormality. He has been seen by Dr. Linna Darner since his emergency room visit with a very nice complete note. Overall the patient is stable. I have not seen him since February, 2013.  Allergies  Allergen Reactions  . Peanut Oil Rash    sneezing  . Trandolapril     cough    Current Outpatient Prescriptions  Medication Sig Dispense Refill  . Acetaminophen 650 MG TABS Take 1 tablet (650 mg total) by mouth 4 (four) times daily as needed.  30 tablet  0  . alfuzosin (UROXATRAL) 10 MG 24 hr tablet Take 10 mg by mouth daily.       Marland Kitchen aspirin 81 MG tablet Take 81 mg by mouth daily.        Marland Kitchen atorvastatin (LIPITOR) 10 MG tablet Take 10 mg by mouth daily.      . clopidogrel (PLAVIX) 75 MG tablet TAKE 1 TABLET DAILY  90 tablet  2  . finasteride (PROSCAR) 5 MG tablet Take 5 mg by mouth daily.      . Multiple Vitamin (MULTIVITAMIN) tablet Take 1 tablet by mouth daily.        . ranitidine (ZANTAC) 150 MG tablet Take 150 mg by mouth at bedtime.      . sildenafil (VIAGRA) 100 MG tablet Take 100 mg by mouth daily as needed for erectile dysfunction. Take 1/2 - 1 tablet prn last rx 01/17/13      . valsartan (DIOVAN) 160 MG tablet TAKE 1 TABLET DAILY  90 tablet  2   No current facility-administered medications for this visit.    History   Social History  . Marital Status: Married    Spouse Name: N/A    Number of Children: N/A  . Years of Education: N/A   Occupational History  . 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 Smoking status: Former Smoker    Types: Pipe    Quit date: 12/27/1991  . Smokeless tobacco:  Not on file     Comment: Quit 20-30 years ago as of 2012  . Alcohol Use: No  . Drug Use: No  . Sexual Activity: Not on file   Other Topics Concern  . Not on file   Social History Narrative  . No narrative on file    Family History  Problem Relation Age of Onset  . Heart failure Mother 50  . Diabetes Mother   . Coronary artery disease Brother   . Diabetes Brother   . Diabetes Sister   . Diabetes Sister     Past Medical History  Diagnosis Date  . CAD (coronary artery disease) 2005    DES January 2 005, residual disease  / Myoview  . Anemia   . GERD (gastroesophageal reflux disease)   . COPD (chronic obstructive pulmonary disease)   . HTN (hypertension)   . Hyperlipidemia   . Low back pain syndrome   . Encounter for long-term (current) use of other medications   . Dysfunction of eustachian tube   . Reactive airway disease   . Thrombocytopenia   . Bladder cancer   .  Hx of skin cancer, basal cell   . Ejection fraction     EF 55%, echo, January, 2009  . Pulmonary nodule     Chest CT, followed  . Carotid artery disease     Doppler, September, 2010, normal, no carotid artery disease  . Gynecomastia     b/l  . BPH (benign prostatic hyperplasia)   . Organic impotence   . Enlarged prostate   . 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 Myocardial infarction     hx acute  . Arthritis     gout  . Hypercholesterolemia   . Carcinoma, renal cell   . 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  . Allergy   . Nocturia   . Hx of cystoscopy 11/02/12    negative  . Staph infection 04/1997    Past Surgical History  Procedure Laterality Date  . Osteomyelitis      staph aureus  . Nephrectomy  02/2001    left kidney, ureter  . Bladder tumors  2003, 2004, 01/2009    removed  . Cholecystectomy  07/2003  . Ureterectomy  2003    for cancer  . Corneal transplant  04/2002    sutured, right  . Corneal transplant  10/2008    left  . Cardiac  catheterization    . Back surgery  02/1996    Patient Active Problem List   Diagnosis Date Noted  . Abnormal CT of the abdomen 12/31/2013  . Abnormal chest x-ray 12/31/2013  . Gynecomastia   . Spinal stenosis, lumbar 05/12/2012  . CAD (coronary artery disease)   . Ejection fraction   . Carotid artery disease   . CERVICAL RADICULOPATHY, LEFT 12/07/2009  . SKIN CANCER, HX OF 12/07/2009  . CARCINOMA, RENAL CELL 07/18/2009  . ANEMIA, 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  . COPD 07/18/2009  . GERD 07/18/2009  . LOW BACK PAIN SYNDROME 07/07/2009  . DYSFUNCTION OF EUSTACHIAN TUBE 04/23/2008  . UNSPECIFIED ESSENTIAL HYPERTENSION 04/23/2008  . REACTIVE AIRWAY DISEASE 03/28/2008  . HYPERLIPIDEMIA 01/25/2007  . Cancer 02/28/2001    ROS   Patient denies fever, chills, headache, sweats, rash, change in vision, change in hearing, chest pain, cough, nausea vomiting, urinary symptoms. All other systems are reviewed and are negative.  PHYSICAL EXAM  Patient is oriented to person time and place. Affect is normal. He really looks quite good. There is no jugulovenous distention. Lungs are clear. Respiratory effort is nonlabored. Cardiac exam reveals S1 and S2. There no clicks or significant murmurs. The abdomen is soft. There is no peripheral edema.  Filed Vitals:   01/27/14 1034  BP: 128/73  Pulse: 65  Height: 5\' 8"  (1.727 m)  Weight: 171 lb (77.565 kg)   I reviewed his most recent EKGs. There is no significant change.  ASSESSMENT & PLAN

## 2014-01-27 NOTE — Assessment & Plan Note (Signed)
Coronary disease is stable. No further workup is needed at this time. I'll see him back in one year.

## 2014-01-30 ENCOUNTER — Other Ambulatory Visit: Payer: Self-pay | Admitting: Cardiology

## 2014-01-30 ENCOUNTER — Other Ambulatory Visit: Payer: Self-pay | Admitting: *Deleted

## 2014-01-30 MED ORDER — ATORVASTATIN CALCIUM 10 MG PO TABS: 10.0000 mg | ORAL_TABLET | Freq: Every day | ORAL | Status: DC

## 2014-02-07 DIAGNOSIS — D485 Neoplasm of uncertain behavior of skin: Secondary | ICD-10-CM | POA: Diagnosis not present

## 2014-02-07 DIAGNOSIS — L570 Actinic keratosis: Secondary | ICD-10-CM | POA: Diagnosis not present

## 2014-03-01 ENCOUNTER — Other Ambulatory Visit: Payer: Self-pay | Admitting: Cardiology

## 2014-03-19 ENCOUNTER — Encounter: Payer: Self-pay | Admitting: Internal Medicine

## 2014-03-19 ENCOUNTER — Ambulatory Visit (INDEPENDENT_AMBULATORY_CARE_PROVIDER_SITE_OTHER): Payer: Medicare Other | Admitting: Internal Medicine

## 2014-03-19 VITALS — BP 148/60 | HR 68 | Temp 97.7°F | Wt 172.4 lb

## 2014-03-19 DIAGNOSIS — R141 Gas pain: Secondary | ICD-10-CM

## 2014-03-19 DIAGNOSIS — I2510 Atherosclerotic heart disease of native coronary artery without angina pectoris: Secondary | ICD-10-CM

## 2014-03-19 DIAGNOSIS — R142 Eructation: Secondary | ICD-10-CM

## 2014-03-19 DIAGNOSIS — R197 Diarrhea, unspecified: Secondary | ICD-10-CM | POA: Diagnosis not present

## 2014-03-19 DIAGNOSIS — R195 Other fecal abnormalities: Secondary | ICD-10-CM

## 2014-03-19 DIAGNOSIS — R143 Flatulence: Secondary | ICD-10-CM

## 2014-03-19 NOTE — Progress Notes (Signed)
   Subjective:    Patient ID: Collin [name], male    DOB: 1925-07-31, 80 y.o.   MRN: [id number]  HPI   He describes change in his bowels described as leakage from the rectum and excessive flatulence over the last 3-4 weeks. It began after he was given a GI cocktail in the emergency room for chest pains.  He denies frank rectal incontinence  These have resulted in some rectal irritation for which he's been using witch hazel wipes and hemorrhoid cream with temporary relief.  He's also been using some antacids which may contain magnesium.  Past medical history of spinal stenosis       Review of Systems  He specifically denies fever, chills, sweats, abdominal pain, unexplained  weight loss, melena, rectal bleeding     Objective:   Physical Exam General appearance is one of good health and nourishment w/o distress.Appears younger than stated age  Eyes: No conjunctival inflammation or scleral icterus is present.  Oral exam: Dental hygiene is good; lips and gums are healthy appearing.There is no oropharyngeal erythema or exudate noted.   Heart:  Normal rate and regular rhythm. S1 and S2 normal without gallop, murmur, click, rub or other extra sounds     Lungs:Chest clear to auscultation; no wheezes, rhonchi,rales ,or rubs present.No increased work of breathing.   Abdomen: bowel sounds normal, soft and non-tender without masses, organomegaly or hernias noted.  No guarding or rebound . No tenderness over the flanks to percussion  Rectum normal externally  Musculoskeletal: Able to lie flat and sit up without help. Negative straight leg raising bilaterally. Gait normal  Skin:Warm & dry.  Intact without suspicious lesions or rashes ; no jaundice or tenting  Lymphatic: No lymphadenopathy is noted about the head, neck, axilla.                Assessment & Plan:  #1 bowel changes after GI cocktail  #2 flatulence; year-old bowel syndrome variant suggested  See after visit  summary

## 2014-03-19 NOTE — Patient Instructions (Signed)
Use soft tissue to remove the stool  and then cleanse the rectal area with a Tucks or Baby Wipe. Please take a probiotic , Florastor , every day until the bowels are normal. This will replace the normal bacteria which  are necessary for formation of normal stool and processing of food. Avoid magnesium products if stools loose.

## 2014-03-19 NOTE — Progress Notes (Signed)
Pre visit review using our clinic review tool, if applicable. No additional management support is needed unless otherwise documented below in the visit note. 

## 2014-05-07 ENCOUNTER — Other Ambulatory Visit: Payer: Self-pay | Admitting: Cardiology

## 2014-08-07 ENCOUNTER — Other Ambulatory Visit: Payer: Self-pay | Admitting: Cardiology

## 2014-08-27 DIAGNOSIS — Z23 Encounter for immunization: Secondary | ICD-10-CM | POA: Diagnosis not present

## 2014-09-24 DIAGNOSIS — L819 Disorder of pigmentation, unspecified: Secondary | ICD-10-CM | POA: Diagnosis not present

## 2014-09-24 DIAGNOSIS — D485 Neoplasm of uncertain behavior of skin: Secondary | ICD-10-CM | POA: Diagnosis not present

## 2014-09-24 DIAGNOSIS — L820 Inflamed seborrheic keratosis: Secondary | ICD-10-CM | POA: Diagnosis not present

## 2014-09-24 DIAGNOSIS — D235 Other benign neoplasm of skin of trunk: Secondary | ICD-10-CM | POA: Diagnosis not present

## 2014-09-24 DIAGNOSIS — D1801 Hemangioma of skin and subcutaneous tissue: Secondary | ICD-10-CM | POA: Diagnosis not present

## 2014-10-09 IMAGING — CR DG CHEST 2V
2 series · 2 of 2 positions shown · non-contrast
Comparison: DG CHEST 2 VIEW dated 12/22/2013; DG RIBS UNILATERAL*L*
dated 11/04/2009; DG CHEST 1V PORT dated 11/04/2009

CLINICAL DATA: Prior smoker. Abnormal chest x-ray follow-up. No
chest complaints.

EXAM:
CHEST  2 VIEW

[view not recorded (1 of 2)]
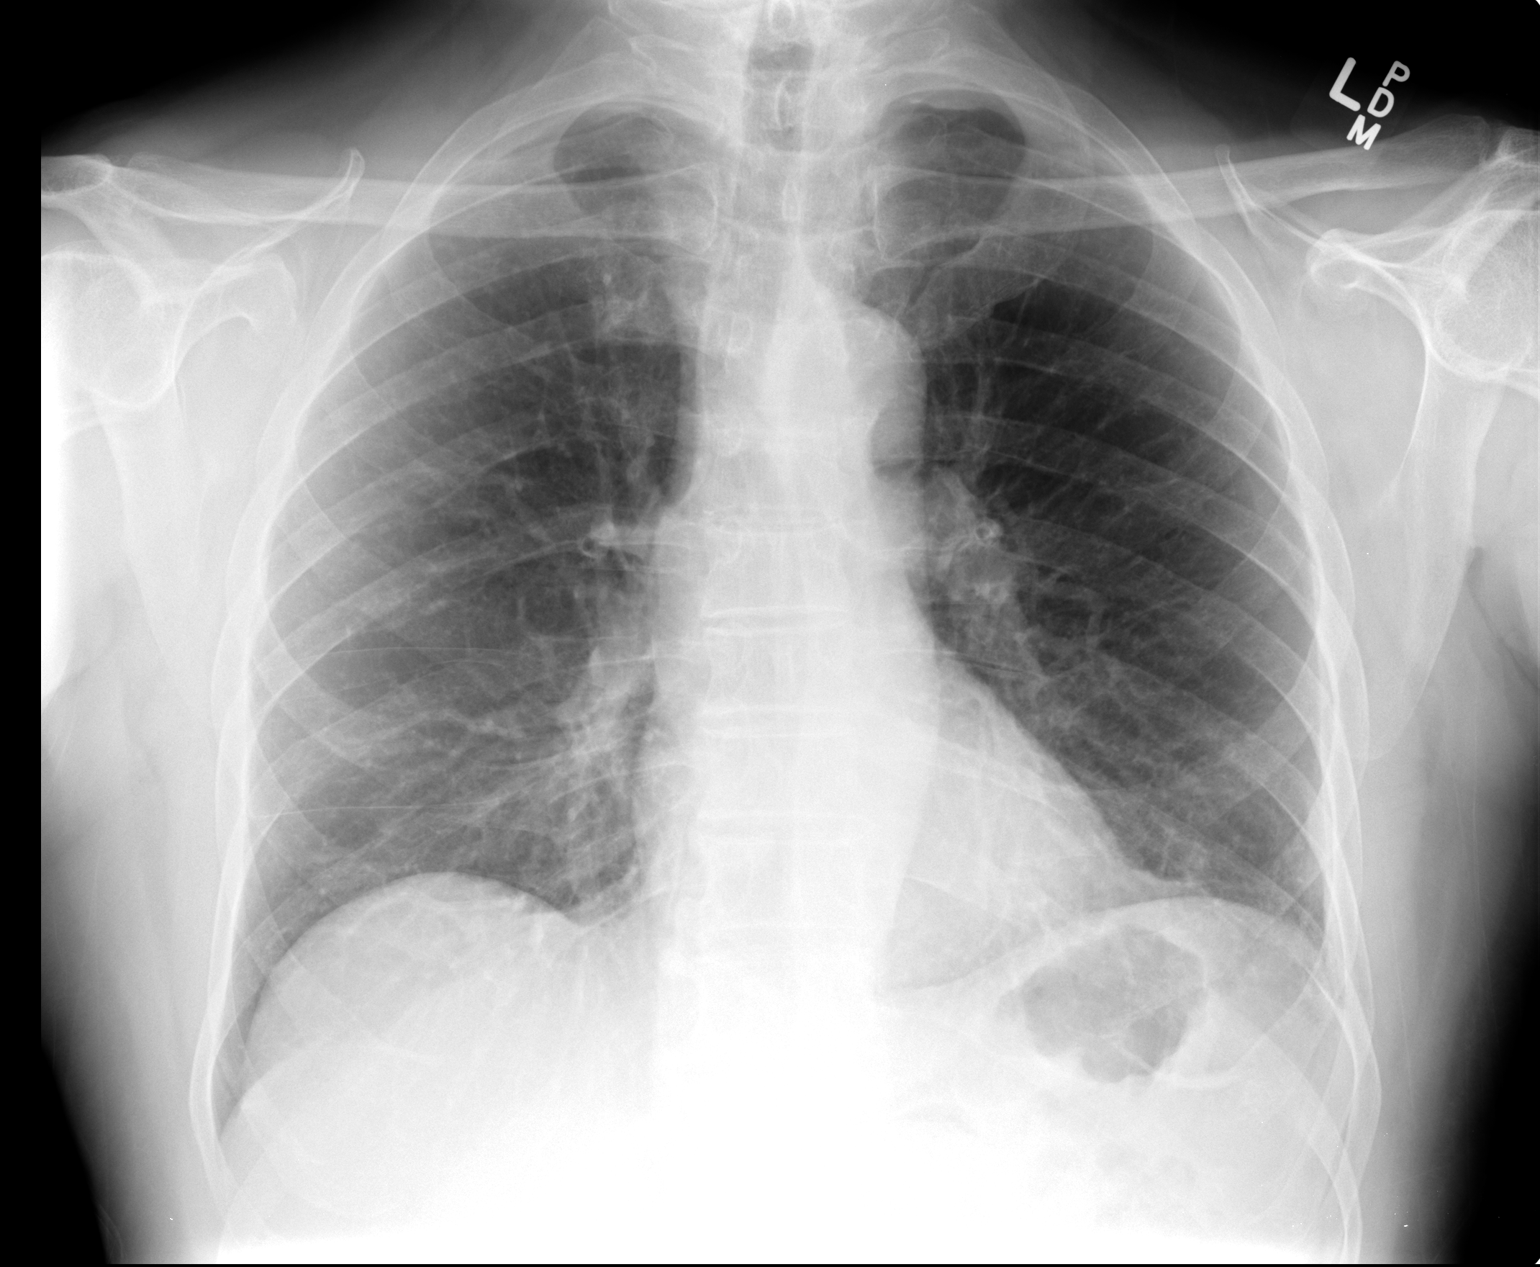

[view not recorded (2 of 2)]
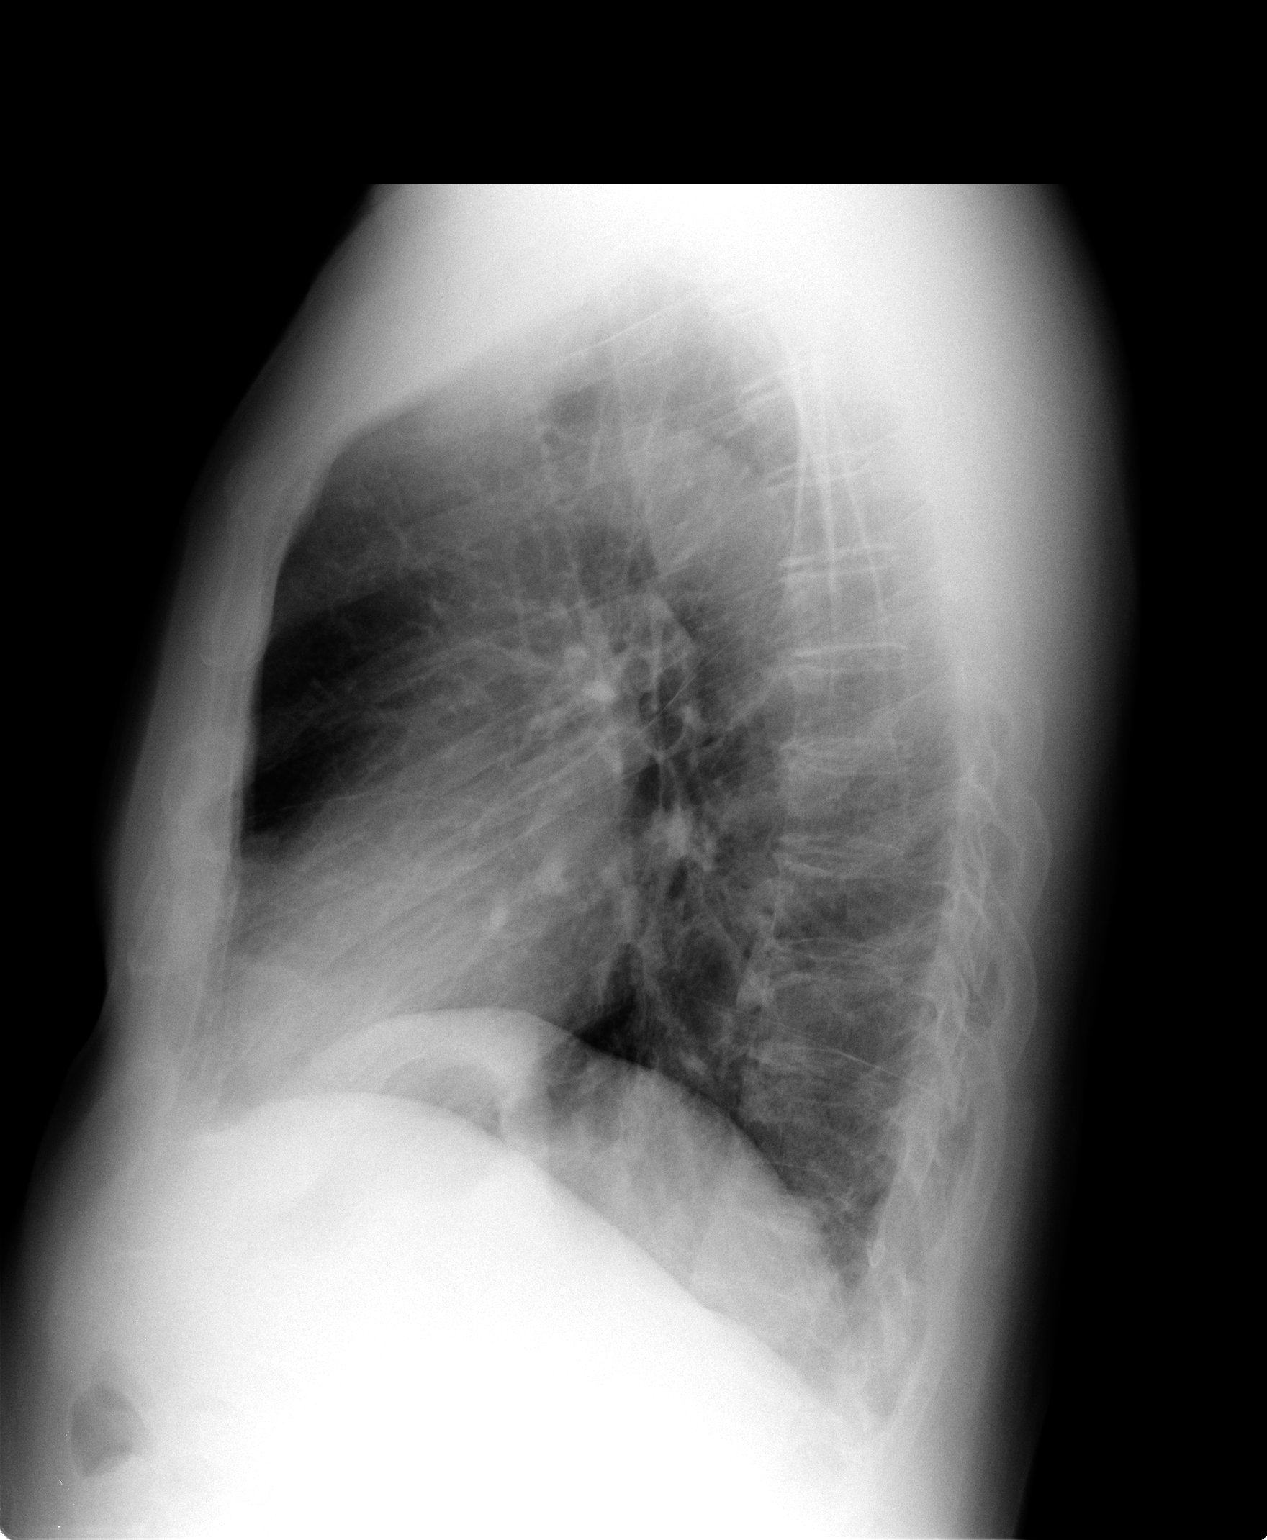

[2 of 2 positions shown; findings below may reference images not displayed]

FINDINGS: There are bilateral chronic bronchitic changes. There is no focal
parenchymal opacity, pleural effusion, or pneumothorax. The heart
and mediastinal contours are unremarkable.

The osseous structures are unremarkable.
IMPRESSION: No active cardiopulmonary disease.

## 2014-10-10 ENCOUNTER — Other Ambulatory Visit: Payer: Self-pay

## 2014-10-14 ENCOUNTER — Other Ambulatory Visit: Payer: Self-pay | Admitting: Cardiology

## 2014-11-06 DIAGNOSIS — C679 Malignant neoplasm of bladder, unspecified: Secondary | ICD-10-CM | POA: Diagnosis not present

## 2014-11-06 DIAGNOSIS — R3915 Urgency of urination: Secondary | ICD-10-CM | POA: Diagnosis not present

## 2014-11-06 DIAGNOSIS — N401 Enlarged prostate with lower urinary tract symptoms: Secondary | ICD-10-CM | POA: Diagnosis not present

## 2014-12-03 DIAGNOSIS — Z947 Corneal transplant status: Secondary | ICD-10-CM | POA: Diagnosis not present

## 2014-12-03 DIAGNOSIS — Z961 Presence of intraocular lens: Secondary | ICD-10-CM | POA: Diagnosis not present

## 2014-12-10 ENCOUNTER — Other Ambulatory Visit: Payer: Self-pay | Admitting: Cardiology

## 2015-01-12 ENCOUNTER — Other Ambulatory Visit: Payer: Self-pay | Admitting: Cardiology

## 2015-01-29 ENCOUNTER — Ambulatory Visit (INDEPENDENT_AMBULATORY_CARE_PROVIDER_SITE_OTHER): Payer: Medicare Other | Admitting: Cardiology

## 2015-01-29 ENCOUNTER — Encounter: Payer: Self-pay | Admitting: Cardiology

## 2015-01-29 VITALS — BP 124/64 | HR 53 | Ht 67.0 in | Wt 164.0 lb

## 2015-01-29 DIAGNOSIS — I10 Essential (primary) hypertension: Secondary | ICD-10-CM

## 2015-01-29 DIAGNOSIS — I779 Disorder of arteries and arterioles, unspecified: Secondary | ICD-10-CM | POA: Diagnosis not present

## 2015-01-29 DIAGNOSIS — E785 Hyperlipidemia, unspecified: Secondary | ICD-10-CM | POA: Diagnosis not present

## 2015-01-29 DIAGNOSIS — I2510 Atherosclerotic heart disease of native coronary artery without angina pectoris: Secondary | ICD-10-CM

## 2015-01-29 DIAGNOSIS — I739 Peripheral vascular disease, unspecified: Secondary | ICD-10-CM

## 2015-01-29 NOTE — Assessment & Plan Note (Signed)
Carotid Doppler in 2010 revealed no significant disease. He has no significant finding on his physical exam. I have chosen not to do a carotid Doppler.

## 2015-01-29 NOTE — Patient Instructions (Signed)
Your physician recommends that you continue on your current medications as directed. Please refer to the Current Medication list given to you today.  Your physician recommends that you return for lab work in: 02/02/15 (anytime..theophylline lab is open from 7:30 until 5 pm daily)  Your physician wants you to follow-up in: 1 year. You will receive a reminder letter in the mail two months in advance. If you don't receive a letter, please call our office to schedule the follow-up appointment.

## 2015-01-29 NOTE — Assessment & Plan Note (Signed)
Patient had a drug-eluting stent in 2005. He does have residual disease. His last cath was 2009. There was no significant change. There was some probable progression of diffuse coronary disease. He is not having symptoms. He does not need any type of exercise testing at this time.

## 2015-01-29 NOTE — Assessment & Plan Note (Signed)
The patient is on 10 mg of atorvastatin. He denied together decided that we would check a fasting lipid profile. I would be inclined to not push his dose unless he were to have a significant abnormality.  The patient can be seen back in 1 year in follow-up.

## 2015-01-29 NOTE — Progress Notes (Signed)
Cardiology Office Note   Date:  01/29/2015   ID:  Collin, [name] [birth year], MRN [id number]  PCP:  Unice Cobble, MD  Cardiologist:  Dola Argyle, MD   Chief Complaint  Patient presents with  . Appointment    Follow-up coronary artery disease      History of Present Illness: Collin [name] is a 81 y.o. male who presents for follow-up of coronary disease. He looks remarkably good for his age. He is not having any significant symptoms. I saw him last one year ago.    Past Medical History  Diagnosis Date  . CAD (coronary artery disease) 2005    DES January 2 005, residual disease  / Myoview  . Anemia   . GERD (gastroesophageal reflux disease)   . COPD (chronic obstructive pulmonary disease)   . HTN (hypertension)   . Hyperlipidemia   . Low back pain syndrome   . Encounter for long-term (current) use of other medications   . Dysfunction of eustachian tube   . Reactive airway disease   . Thrombocytopenia   . Bladder cancer   . Hx of skin cancer, basal cell   . Ejection fraction     EF 55%, echo, January, 2009  . Pulmonary nodule     Chest CT, followed  . Carotid artery disease     Doppler, September, 2010, normal, no carotid artery disease  . Gynecomastia     b/l  . BPH (benign prostatic hyperplasia)   . Organic impotence   . Enlarged prostate   . 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 Myocardial infarction     hx acute  . Arthritis     gout  . Hypercholesterolemia   . Carcinoma, renal cell   . 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  . Allergy   . Nocturia   . Hx of cystoscopy 11/02/12    negative  . Staph infection 04/1997    Past Surgical History  Procedure Laterality Date  . Osteomyelitis      staph aureus  . Nephrectomy  02/2001    left kidney, ureter  . Bladder tumors  2003, 2004, 01/2009    removed  . Cholecystectomy  07/2003  . Ureterectomy  2003    for cancer  . Corneal transplant  04/2002    sutured, right    . Corneal transplant  10/2008    left  . Cardiac catheterization    . Back surgery  02/1996    Patient Active Problem List   Diagnosis Date Noted  . Abnormal CT of the abdomen 12/31/2013  . Abnormal chest x-ray 12/31/2013  . Gynecomastia   . Spinal stenosis, lumbar 05/12/2012  . CAD (coronary artery disease)   . Ejection fraction   . Carotid artery disease   . CERVICAL RADICULOPATHY, LEFT 12/07/2009  . SKIN CANCER, HX OF 12/07/2009  . CARCINOMA, RENAL CELL 07/18/2009  . ANEMIA, 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  . COPD 07/18/2009  . GERD 07/18/2009  . LOW BACK PAIN SYNDROME 07/07/2009  . DYSFUNCTION OF EUSTACHIAN TUBE 04/23/2008  . Essential hypertension 04/23/2008  . REACTIVE AIRWAY DISEASE 03/28/2008  . HYPERLIPIDEMIA 01/25/2007  . Cancer 02/28/2001      Current Outpatient Prescriptions  Medication Sig Dispense Refill  . alfuzosin (UROXATRAL) 10 MG 24 hr tablet Take 10 mg by mouth daily.     Marland Kitchen aspirin 81 MG tablet Take 81 mg by mouth daily.      Marland Kitchen  atorvastatin (LIPITOR) 10 MG tablet Take 1 tablet (10 mg total) by mouth daily. 90 tablet 3  . clopidogrel (PLAVIX) 75 MG tablet TAKE 1 TABLET DAILY 90 tablet 1  . finasteride (PROSCAR) 5 MG tablet Take 5 mg by mouth daily.    . Multiple Vitamin (MULTIVITAMIN) tablet Take 1 tablet by mouth daily.      . ranitidine (ZANTAC) 150 MG tablet Take 150 mg by mouth at bedtime.    . sildenafil (VIAGRA) 100 MG tablet Take 100 mg by mouth daily as needed for erectile dysfunction. Take 1/2 - 1 tablet prn last rx 01/17/13    . valsartan (DIOVAN) 160 MG tablet TAKE 1 TABLET DAILY 90 tablet 0   No current facility-administered medications for this visit.    Allergies:   Peanut oil and Trandolapril    Social History:  The patient  reports that he quit smoking about 23 years ago. His smoking use included Pipe. He does not have any smokeless tobacco history on file. He reports that he does not drink alcohol or use illicit drugs.   Family History:  The  patient's family history includes Coronary artery disease in his brother; Diabetes in his brother, mother, sister, and sister; Heart failure (age of onset: 74) in his mother.    ROS:  Please see the history of present illness.  Patient denies fever, chills, headache, sweats, rash, change in vision, change in hearing, chest pain, cough, nausea or vomiting, urinary symptoms. All other systems are reviewed and are negative.     PHYSICAL EXAM: VS:  BP 124/64 mmHg  Pulse 53  Ht 5\' 7"  (1.702 m)  Wt 164 lb (74.39 kg)  BMI 25.68 kg/m2 , The patient looks great. He is oriented to person time and place. Affect is normal. Head is atraumatic. Sclera and conjunctiva are normal. There is no jugulovenous distention. Lungs are clear. Respiratory effort is not labored. Cardiac exam reveals S1 and S2. The abdomen is soft. There is no peripheral edema. There are no musculoskeletal deformities. There are no skin rashes. Neurologic is grossly intact.  EKG:   EKG is done today and reviewed by me. There is normal sinus rhythm. There is no change from the past.   Recent Labs: No results found for requested labs within last 365 days.    Lipid Panel    Component Value Date/Time   CHOL 143 01/27/2014 1101   TRIG 424.0* 01/27/2014 1101   HDL 37.20* 01/27/2014 1101   CHOLHDL 4 01/27/2014 1101   VLDL 84.8* 01/27/2014 1101   LDLCALC 39 12/07/2009 1103   LDLDIRECT 48.9 01/27/2014 1101      Wt Readings from Last 3 Encounters:  01/29/15 164 lb (74.39 kg)  03/19/14 172 lb 6.4 oz (78.2 kg)  01/27/14 171 lb (77.565 kg)      Current medicines are reviewed. The patient has a very good understanding of his medications. No changes are to be made.       ASSESSMENT AND PLAN:

## 2015-02-02 ENCOUNTER — Other Ambulatory Visit (INDEPENDENT_AMBULATORY_CARE_PROVIDER_SITE_OTHER): Payer: Medicare Other | Admitting: *Deleted

## 2015-02-02 DIAGNOSIS — E785 Hyperlipidemia, unspecified: Secondary | ICD-10-CM

## 2015-02-02 LAB — LIPID PANEL
CHOL/HDL RATIO: 3
Cholesterol: 123 mg/dL (ref 0–200)
HDL: 36.3 mg/dL — ABNORMAL LOW (ref >39.00)
LDL Cholesterol: 49 mg/dL (ref 0–99)
NonHDL: 86.7
Triglycerides: 188 mg/dL — ABNORMAL HIGH (ref 0.0–149.0)
VLDL: 37.6 mg/dL (ref 0.0–40.0)

## 2015-02-02 NOTE — Addendum Note (Signed)
Addended by: Eulis Foster on: 02/02/2015 08:41 AM   Modules accepted: Orders

## 2015-02-16 DIAGNOSIS — Z947 Corneal transplant status: Secondary | ICD-10-CM | POA: Diagnosis not present

## 2015-02-16 DIAGNOSIS — B0052 Herpesviral keratitis: Secondary | ICD-10-CM | POA: Insufficient documentation

## 2015-02-20 DIAGNOSIS — B0052 Herpesviral keratitis: Secondary | ICD-10-CM | POA: Diagnosis not present

## 2015-02-20 DIAGNOSIS — Z947 Corneal transplant status: Secondary | ICD-10-CM | POA: Diagnosis not present

## 2015-03-06 DIAGNOSIS — Z947 Corneal transplant status: Secondary | ICD-10-CM | POA: Diagnosis not present

## 2015-03-06 DIAGNOSIS — B0052 Herpesviral keratitis: Secondary | ICD-10-CM | POA: Diagnosis not present

## 2015-03-21 ENCOUNTER — Other Ambulatory Visit: Payer: Self-pay | Admitting: Cardiology

## 2015-03-25 DIAGNOSIS — D225 Melanocytic nevi of trunk: Secondary | ICD-10-CM | POA: Diagnosis not present

## 2015-03-25 DIAGNOSIS — L814 Other melanin hyperpigmentation: Secondary | ICD-10-CM | POA: Diagnosis not present

## 2015-03-25 DIAGNOSIS — Z08 Encounter for follow-up examination after completed treatment for malignant neoplasm: Secondary | ICD-10-CM | POA: Diagnosis not present

## 2015-03-25 DIAGNOSIS — L820 Inflamed seborrheic keratosis: Secondary | ICD-10-CM | POA: Diagnosis not present

## 2015-03-25 DIAGNOSIS — L570 Actinic keratosis: Secondary | ICD-10-CM | POA: Diagnosis not present

## 2015-03-25 DIAGNOSIS — Z85828 Personal history of other malignant neoplasm of skin: Secondary | ICD-10-CM | POA: Diagnosis not present

## 2015-04-12 ENCOUNTER — Other Ambulatory Visit: Payer: Self-pay | Admitting: Cardiology

## 2015-05-05 ENCOUNTER — Other Ambulatory Visit: Payer: Self-pay | Admitting: *Deleted

## 2015-05-05 MED ORDER — ATORVASTATIN CALCIUM 10 MG PO TABS: 10.0000 mg | ORAL_TABLET | Freq: Every day | ORAL | Status: DC

## 2015-05-20 ENCOUNTER — Ambulatory Visit (INDEPENDENT_AMBULATORY_CARE_PROVIDER_SITE_OTHER): Payer: Medicare Other | Admitting: Internal Medicine

## 2015-05-20 ENCOUNTER — Encounter: Payer: Self-pay | Admitting: Internal Medicine

## 2015-05-20 VITALS — BP 130/58 | HR 72 | Temp 97.8°F | Ht 67.0 in | Wt 170.1 lb

## 2015-05-20 DIAGNOSIS — R195 Other fecal abnormalities: Secondary | ICD-10-CM | POA: Diagnosis not present

## 2015-05-20 DIAGNOSIS — I2510 Atherosclerotic heart disease of native coronary artery without angina pectoris: Secondary | ICD-10-CM | POA: Diagnosis not present

## 2015-05-20 DIAGNOSIS — J309 Allergic rhinitis, unspecified: Secondary | ICD-10-CM | POA: Diagnosis not present

## 2015-05-20 NOTE — Progress Notes (Signed)
   Subjective:    Patient ID: Collin [name], male    DOB: August 01, 1925, 81 y.o.   MRN: [id number]  HPI  His symptoms began 5/23 as green stool; this recurred 5/24. Stool was normal this morning. There was no specific dietary trigger for this prior to the change in color. He denies any other constitutional or GI symptoms.  He has had allergy symptoms with nasal congestion and itchy, watery eyes. He's use Claritin with only minimal response.  Review of Systems Unexplained weight loss, abdominal pain, significant dyspepsia, dysphagia, melena, rectal bleeding, or persistently small caliber stools are denied. He denies clay colored stools or cola colored urine.  Frontal headache, facial pain , nasal purulence, dental pain, sore throat , otic pain or otic discharge denied. No fever , chills or sweats.    Objective:   Physical Exam  Pertinent or positive findings include: Tympanic membranes are dull. He has erythema of the nares.  Heart sounds are markedly distant. They're heard best in the epigastrium.  General appearance :adequately nourished; in no distress. Eyes: No conjunctival inflammation or scleral icterus is present. Oral exam:  Lips and gums are healthy appearing.There is no oropharyngeal erythema or exudate noted. Dental hygiene is good. Heart:  Normal rate and regular rhythm. S1 and S2 normal without gallop, murmur, click, rub or other extra sounds   Lungs:Chest clear to auscultation; no wheezes, rhonchi,rales ,or rubs present.No increased work of breathing.  Abdomen: bowel sounds normal, soft and non-tender without masses, organomegaly or hernias noted.  No guarding or rebound. No flank tenderness to percussion. Vascular : all pulses equal ; no bruits present. Skin:Warm & dry.  Intact without suspicious lesions or rashes ; no tenting or jaundice  Lymphatic: No lymphadenopathy is noted about the head, neck, axilla Neuro: Strength, tone & DTRs normal.       Assessment & Plan:    #1 change in stool color, specifically green stool. Resolved  #2 extrinsic rhinitis  Plan: See orders and recommendations

## 2015-05-20 NOTE — Patient Instructions (Signed)

## 2015-05-20 NOTE — Progress Notes (Signed)
Pre visit review using our clinic review tool, if applicable. No additional management support is needed unless otherwise documented below in the visit note. 

## 2015-06-22 ENCOUNTER — Other Ambulatory Visit: Payer: Self-pay

## 2015-09-07 DIAGNOSIS — Z23 Encounter for immunization: Secondary | ICD-10-CM | POA: Diagnosis not present

## 2015-09-09 DIAGNOSIS — Z947 Corneal transplant status: Secondary | ICD-10-CM | POA: Diagnosis not present

## 2015-10-01 ENCOUNTER — Other Ambulatory Visit: Payer: Self-pay | Admitting: Dermatology

## 2015-10-01 DIAGNOSIS — D485 Neoplasm of uncertain behavior of skin: Secondary | ICD-10-CM | POA: Diagnosis not present

## 2015-10-01 DIAGNOSIS — L820 Inflamed seborrheic keratosis: Secondary | ICD-10-CM | POA: Diagnosis not present

## 2015-10-01 DIAGNOSIS — L821 Other seborrheic keratosis: Secondary | ICD-10-CM | POA: Diagnosis not present

## 2015-10-13 ENCOUNTER — Encounter: Payer: Self-pay | Admitting: *Deleted

## 2015-11-16 ENCOUNTER — Encounter: Payer: Self-pay | Admitting: Internal Medicine

## 2015-11-18 DIAGNOSIS — Z8551 Personal history of malignant neoplasm of bladder: Secondary | ICD-10-CM | POA: Diagnosis not present

## 2015-11-18 DIAGNOSIS — N400 Enlarged prostate without lower urinary tract symptoms: Secondary | ICD-10-CM | POA: Diagnosis not present

## 2015-12-18 ENCOUNTER — Ambulatory Visit (INDEPENDENT_AMBULATORY_CARE_PROVIDER_SITE_OTHER): Payer: Medicare Other | Admitting: Internal Medicine

## 2015-12-18 VITALS — BP 124/62 | HR 78 | Temp 97.8°F | Resp 16 | Wt 174.0 lb

## 2015-12-18 DIAGNOSIS — I2510 Atherosclerotic heart disease of native coronary artery without angina pectoris: Secondary | ICD-10-CM

## 2015-12-18 DIAGNOSIS — M609 Myositis, unspecified: Secondary | ICD-10-CM

## 2015-12-18 DIAGNOSIS — M791 Myalgia: Secondary | ICD-10-CM

## 2015-12-18 DIAGNOSIS — E785 Hyperlipidemia, unspecified: Secondary | ICD-10-CM

## 2015-12-18 DIAGNOSIS — R195 Other fecal abnormalities: Secondary | ICD-10-CM | POA: Diagnosis not present

## 2015-12-18 DIAGNOSIS — J309 Allergic rhinitis, unspecified: Secondary | ICD-10-CM | POA: Diagnosis not present

## 2015-12-18 DIAGNOSIS — IMO0001 Reserved for inherently not codable concepts without codable children: Secondary | ICD-10-CM

## 2015-12-18 NOTE — Progress Notes (Signed)
Pre visit review using our clinic review tool, if applicable. No additional management support is needed unless otherwise documented below in the visit note. 

## 2015-12-18 NOTE — Progress Notes (Signed)
   Subjective:    Patient ID: Collin [name], male    DOB: 26-Sep-1925, 81 y.o.   MRN: [id number]  HPI  He  stopped his atorvastatin  11 / 18/16 because of "tiredness" and pain in his legs. This was described as burning in the calves.   While on the statin his LDL was 49 and HDL 36.3 on 02/02/15. He did have stenting in 2005. Catheterization 2009 revealed diffuse atherosclerosis.  A brother had coronary disease possibly. There is no definite heart attack or stroke in the family.    He is on a heart healthy diet but does have increased carbs in his diet. He bowls once a week and is on the treadmill 5-6 X /week for 30 minutes without symptoms   He recently developed rhinitis and after taking an over-the-counter Dristan preparation developed abdominal discomfort described as soreness followed by loose stool. He did have some itchy, watery eyes.  Review of Systems  There is no significant cough, sputum production,hemoptysis, wheezing,or  paroxysmal nocturnal dyspnea @ present. Unexplained weight loss, significant dyspepsia, dysphagia, melena, rectal bleeding, or persistently small caliber stools are not present. Dysuria, pyuria, hematuria, frequency, nocturia or polyuria are denied. Frontal headache, facial pain , nasal purulence, dental pain, sore throat , otic pain or otic discharge denied. No fever , chills or sweats.     Objective:   Physical Exam Pertinent or positive findings include: ptosis is present greater on the right than the left. There is erythema of the nasal mucosa especially on the right. The septum is deviated to the right. He has DIP changes in his hands. He has minor crepitus of the knees.  General appearance :Appears younger than stated age;adequately nourished; in no distress.  Eyes: No conjunctival inflammation or scleral icterus is present.  Oral exam:  Lips and gums are healthy appearing.There is no oropharyngeal erythema or exudate noted. Dental hygiene is  good.  Heart:  Normal rate and regular rhythm. S1 and S2 normal without gallop, murmur, click, rub or other extra sounds    Lungs:Chest clear to auscultation; no wheezes, rhonchi,rales ,or rubs present.No increased work of breathing.   Abdomen: bowel sounds normal, soft and non-tender without masses, organomegaly or hernias noted.  No guarding or rebound.   Vascular : all pulses equal ; no bruits present.  Skin:Warm & dry.  Intact without suspicious lesions or rashes ; no tenting or jaundice   Lymphatic: No lymphadenopathy is noted about the head, neck, axilla.   Neuro: Strength, tone & DTRs normal.     Assessment & Plan:   #1 dyslipidemia   #2 diffuse atherosclerosis    #3 subjective intolerance  to atorvastatin  #4 recent rhinitis with possible adverse drug reaction to Dristan. No suggestion of sinusitis.   Plan: The risks and benefits of the statin were discussed with him in detail. The pathophysiology of atherosclerosis was also discussed.   fasting lipids we'll be collected along with a CK and C-reactive protein to help assess his long-term risk. He will continue the baby coated aspirin.   nasal hygiene will be prescribed for his rhinitis. Probiotics recommended for the loose stool.

## 2015-12-18 NOTE — Patient Instructions (Signed)
Plain Mucinex (NOT D) for thick secretions ;force NON dairy fluids .   Nasal cleansing in the shower as discussed with lather of mild shampoo.After 10 seconds wash off lather while  exhaling through nostrils. Make sure that all residual soap is removed to prevent irritation.  Flonase OR Nasacort AQ 1 spray in each nostril twice a day as needed. Use the "crossover" technique into opposite nostril spraying toward opposite ear @ 45 degree angle, not straight up into nostril.  Plain Allegra (NOT D )  160 daily , Loratidine 10 mg , OR Zyrtec 10 mg @ bedtime  as needed for itchy eyes & sneezing.  Pease take a probiotic , Florastor OR Align, every day if the bowels are loose. This will replace the normal bacteria which  are necessary for formation of normal stool and processing of food.  Your next office appointment will be determined based upon review of your pending labs. Those written interpretation of the lab results and instructions will be transmitted to you by My Chart Critical results will be called. Followup as needed for any active or acute issue. Please report any significant change in your symptoms.

## 2015-12-22 ENCOUNTER — Other Ambulatory Visit (INDEPENDENT_AMBULATORY_CARE_PROVIDER_SITE_OTHER): Payer: Medicare Other

## 2015-12-22 DIAGNOSIS — IMO0001 Reserved for inherently not codable concepts without codable children: Secondary | ICD-10-CM

## 2015-12-22 DIAGNOSIS — E785 Hyperlipidemia, unspecified: Secondary | ICD-10-CM | POA: Diagnosis not present

## 2015-12-22 DIAGNOSIS — M609 Myositis, unspecified: Secondary | ICD-10-CM

## 2015-12-22 DIAGNOSIS — I2510 Atherosclerotic heart disease of native coronary artery without angina pectoris: Secondary | ICD-10-CM

## 2015-12-22 DIAGNOSIS — M791 Myalgia: Secondary | ICD-10-CM | POA: Diagnosis not present

## 2015-12-22 LAB — TSH: TSH: 3.26 u[IU]/mL (ref 0.35–4.50)

## 2015-12-22 LAB — C-REACTIVE PROTEIN: CRP: 0.2 mg/dL — ABNORMAL LOW (ref 0.5–20.0)

## 2015-12-22 LAB — LIPID PANEL
CHOLESTEROL: 312 mg/dL — AB (ref 0–200)
HDL: 27.6 mg/dL — ABNORMAL LOW (ref >39.00)
Total CHOL/HDL Ratio: 11

## 2015-12-22 LAB — LDL CHOLESTEROL, DIRECT: Direct LDL: 50 mg/dL

## 2015-12-22 LAB — CK: CK TOTAL: 97 U/L (ref 7–232)

## 2016-01-05 ENCOUNTER — Other Ambulatory Visit (INDEPENDENT_AMBULATORY_CARE_PROVIDER_SITE_OTHER): Payer: Medicare Other

## 2016-01-05 ENCOUNTER — Encounter: Payer: Self-pay | Admitting: Internal Medicine

## 2016-01-05 ENCOUNTER — Ambulatory Visit (INDEPENDENT_AMBULATORY_CARE_PROVIDER_SITE_OTHER): Payer: Medicare Other | Admitting: Internal Medicine

## 2016-01-05 VITALS — BP 136/80 | HR 67 | Temp 97.9°F | Resp 16 | Wt 169.0 lb

## 2016-01-05 DIAGNOSIS — K921 Melena: Secondary | ICD-10-CM | POA: Diagnosis not present

## 2016-01-05 DIAGNOSIS — K219 Gastro-esophageal reflux disease without esophagitis: Secondary | ICD-10-CM | POA: Diagnosis not present

## 2016-01-05 DIAGNOSIS — R1031 Right lower quadrant pain: Secondary | ICD-10-CM | POA: Diagnosis not present

## 2016-01-05 LAB — COMPREHENSIVE METABOLIC PANEL
ALT: 17 U/L (ref 0–53)
AST: 15 U/L (ref 0–37)
Albumin: 4.5 g/dL (ref 3.5–5.2)
Alkaline Phosphatase: 71 U/L (ref 39–117)
BILIRUBIN TOTAL: 0.9 mg/dL (ref 0.2–1.2)
BUN: 25 mg/dL — AB (ref 6–23)
CALCIUM: 9.8 mg/dL (ref 8.4–10.5)
CO2: 31 meq/L (ref 19–32)
CREATININE: 1.53 mg/dL — AB (ref 0.40–1.50)
Chloride: 102 mEq/L (ref 96–112)
GFR: 45.82 mL/min — ABNORMAL LOW (ref >60.00)
GLUCOSE: 86 mg/dL (ref 70–99)
Potassium: 4.7 mEq/L (ref 3.5–5.1)
SODIUM: 140 meq/L (ref 135–145)
Total Protein: 7.2 g/dL (ref 6.0–8.3)

## 2016-01-05 LAB — CBC WITH DIFFERENTIAL/PLATELET
BASOS ABS: 0 10*3/uL (ref 0.0–0.1)
Basophils Relative: 0.3 % (ref 0.0–3.0)
EOS ABS: 0.2 10*3/uL (ref 0.0–0.7)
Eosinophils Relative: 4.8 % (ref 0.0–5.0)
HCT: 44.4 % (ref 39.0–52.0)
Hemoglobin: 14.9 g/dL (ref 13.0–17.0)
LYMPHS ABS: 1.3 10*3/uL (ref 0.7–4.0)
Lymphocytes Relative: 25.6 % (ref 12.0–46.0)
MCHC: 33.5 g/dL (ref 30.0–36.0)
MCV: 92.8 fl (ref 78.0–100.0)
Monocytes Absolute: 0.7 10*3/uL (ref 0.1–1.0)
Monocytes Relative: 13.6 % — ABNORMAL HIGH (ref 3.0–12.0)
NEUTROS ABS: 2.7 10*3/uL (ref 1.4–7.7)
NEUTROS PCT: 55.7 % (ref 43.0–77.0)
PLATELETS: 131 10*3/uL — AB (ref 150.0–400.0)
RBC: 4.78 Mil/uL (ref 4.22–5.81)
RDW: 13.3 % (ref 11.5–15.5)
WBC: 4.9 10*3/uL (ref 4.0–10.5)

## 2016-01-05 MED ORDER — CVS PROBIOTIC PO CAPS: ORAL_CAPSULE | ORAL | Status: DC

## 2016-01-05 MED ORDER — PANTOPRAZOLE SODIUM 40 MG PO TBEC: 40.0000 mg | DELAYED_RELEASE_TABLET | Freq: Every day | ORAL | Status: DC

## 2016-01-05 NOTE — Progress Notes (Signed)
Pre visit review using our clinic review tool, if applicable. No additional management support is needed unless otherwise documented below in the visit note. 

## 2016-01-05 NOTE — Patient Instructions (Signed)
  We have reviewed your prior records including labs and tests today.  Test(s) ordered today. Your results will be released to [place] (or called to you) after review, usually within 72hours after test completion. If any changes need to be made, you will be notified at that same time.  Medications reviewed and updated.  Changes include adding protonix - take 30 minutes prior to breakfast.  Continue zantac at night only.  Your prescription(s) have been submitted to your pharmacy. Please take as directed and contact our office if you believe you are having problem(s) with the medication(s).  A referral was ordered for GI.  A ct scan was ordered to evaluate your abdominal pain.    Please call if your symptoms worsen or do not improve.

## 2016-01-05 NOTE — Progress Notes (Signed)
Subjective:    Patient ID: Collin [name], male    DOB: Dec 23, 1925, 82 y.o.   MRN: [id number]  HPI He is here for an acute visit for blood in his stool. Last week he had a bm that loooked tarry and then turned black. He then had a normal bowel movement. Last Sunday he again had a bowel movement that looked tarry and then became black. No black stool turned normal again.     He had esophageal soreness with his last abnormal bm.  This lasted a few hours. He denies having this soreness in the past, but does experience GERD. Over the past week or so he has been experiencing GERD symptoms most nights. He takes Zantac daily and since noticing increased GERD symptoms he started taking it twice daily. That has improved his symptoms.    He has had some right middle and lower abdominal pain for months.  It is more of a soreness.  It feels fairly constant.  It has not increased in intensity over the past few months.  Sometimes it is more painful than other times.  There are no alleviating or exacerbating factors.  He denies abdominal pain elsewhere. He denies rectal pain.  He did have some stool incontinence not long ago - dr hopper started him on probiotics and it heped. He denies any bright red blood per rectum.    He denies any urinary symptoms at the ordinary. He does not have blood in his urine. He follows with urology routinely and his urine was recently checked and was negative for blood.  He does take aspirin and Plavix daily. He does not take any other NSAIDs.  Medications and allergies reviewed with patient and updated if appropriate.  Patient Active Problem List   Diagnosis Date Noted  . Myalgia and myositis 12/18/2015  . Abnormal CT of the abdomen 12/31/2013  . Abnormal chest x-ray 12/31/2013  . Gynecomastia   . Spinal stenosis, lumbar 05/12/2012  . CAD (coronary artery disease)   . Ejection fraction   . Carotid artery disease ([place])   . CERVICAL RADICULOPATHY, LEFT 12/07/2009  . SKIN  CANCER, HX OF 12/07/2009  . CARCINOMA, RENAL CELL 07/18/2009  . ANEMIA, 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  . COPD 07/18/2009  . GERD 07/18/2009  . LOW BACK PAIN SYNDROME 07/07/2009  . DYSFUNCTION OF EUSTACHIAN TUBE 04/23/2008  . Essential hypertension 04/23/2008  . REACTIVE AIRWAY DISEASE 03/28/2008  . Hyperlipidemia 01/25/2007  . Cancer ([place]) 02/28/2001    Current Outpatient Prescriptions on File Prior to Visit  Medication Sig Dispense Refill  . alfuzosin (UROXATRAL) 10 MG 24 hr tablet Take 10 mg by mouth daily.     Marland Kitchen aspirin 81 MG tablet Take 81 mg by mouth daily.      . clopidogrel (PLAVIX) 75 MG tablet TAKE 1 TABLET DAILY 90 tablet 3  . finasteride (PROSCAR) 5 MG tablet Take 5 mg by mouth daily.    . Multiple Vitamin (MULTIVITAMIN) tablet Take 1 tablet by mouth daily.      . ranitidine (ZANTAC) 150 MG tablet Take 150 mg by mouth at bedtime.    . sildenafil (VIAGRA) 100 MG tablet Take 100 mg by mouth daily as needed for erectile dysfunction. Take 1/2 - 1 tablet prn last rx 01/17/13    . valsartan (DIOVAN) 160 MG tablet TAKE 1 TABLET DAILY 90 tablet 2   No current facility-administered medications on file prior to visit.    Past Medical History  Diagnosis Date  .  CAD (coronary artery disease) 2005    DES January 2 005, residual disease  / Myoview  . Anemia   . GERD (gastroesophageal reflux disease)   . COPD (chronic obstructive pulmonary disease)   . HTN (hypertension)   . Hyperlipidemia   . Low back pain syndrome   . Encounter for long-term (current) use of other medications   . Dysfunction of eustachian tube   . Reactive airway disease   . Thrombocytopenia   . Bladder cancer   . Hx of skin cancer, basal cell   . Ejection fraction     EF 55%, echo, January, 2009  . Pulmonary nodule     Chest CT, followed  . Carotid artery disease     Doppler, September, 2010, normal, no carotid artery disease  . Gynecomastia     b/l  . BPH (benign prostatic hyperplasia)   . Organic impotence    . Enlarged prostate   . 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 Myocardial infarction ([place])     hx acute  . Arthritis     gout  . Hypercholesterolemia   . Carcinoma, renal cell   . 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  . Allergy   . Nocturia   . Hx of cystoscopy 11/02/12    negative  . Staph infection 04/1997    Past Surgical History  Procedure Laterality Date  . Osteomyelitis      staph aureus  . Nephrectomy  02/2001    left kidney, ureter  . Bladder tumors  2003, 2004, 01/2009    removed  . Cholecystectomy  07/2003  . Ureterectomy  2003    for cancer  . Corneal transplant  04/2002    sutured, right  . Corneal transplant  10/2008    left  . Cardiac catheterization    . Back surgery  02/1996    Social History   Social History  . Marital Status: Married    Spouse Name: N/A  . Number of Children: N/A  . Years of Education: N/A   Occupational History  . 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 Smoking status: Former Smoker    Types: Pipe    Quit date: 12/27/1991  . Smokeless tobacco: Not on file     Comment: Quit 20-30 years ago as of 2012  . Alcohol Use: No  . Drug Use: No  . Sexual Activity: Not on file   Other Topics Concern  . Not on file   Social History Narrative    Family History  Problem Relation Age of Onset  . Heart failure Mother 19  . Diabetes Mother   . Coronary artery disease Brother   . Diabetes Brother   . Diabetes Sister   . Diabetes Sister     Review of Systems  Constitutional: Negative for fever, chills, appetite change, fatigue and unexpected weight change.  HENT: Positive for congestion and sinus pressure. Negative for trouble swallowing.   Respiratory: Positive for shortness of breath (dyspnea with exertion - chronic and unchanged). Negative for cough and wheezing.   Cardiovascular: Negative for chest pain, palpitations and leg swelling.  Gastrointestinal: Positive for  abdominal pain. Negative for nausea, diarrhea and constipation.        GERD  Genitourinary: Negative for dysuria and hematuria.  Neurological: Negative for dizziness, light-headedness and headaches.       Objective:   Filed Vitals:  01/05/16 1032  BP: 136/80  Pulse: 67  Temp: 97.9 F (36.6 C)  Resp: 16   Filed Weights   01/05/16 1032  Weight: 169 lb (76.658 kg)   Body mass index is 26.46 kg/(m^2).   Physical Exam  Constitutional: He appears well-developed and well-nourished. No distress.  Cardiovascular: Normal rate, regular rhythm and normal heart sounds.   Pulmonary/Chest: Effort normal and breath sounds normal. No respiratory distress. He has no wheezes. He has no rales.  Abdominal: Soft. Bowel sounds are normal. He exhibits no distension and no mass. There is tenderness (Right mid quadrant and right lower quadrant-mild in nature). There is no rebound and no guarding.  Genitourinary:  Rectal exam deferred-needs GI referral regardless most likely is experiencing an upper GI bleeding  Musculoskeletal: He exhibits no edema.  Skin: He is not diaphoretic.          Assessment & Plan:   Melena, increased GERD, episode of esophageal pain Most likely the melena is related to an upper GI bleed-possible ulcer versus gastritis His GERD has not been controlled and he is on 2 blood thinners Continue Zantac at bedtime Start pantoprazole 40 mg daily Check CBC, CMP We'll refer to GI for possible endoscopy  Right mid quadrant and lower quadrant abdominal pain He has had this pain for months and has been no improvement This may be related to the melena that he has a small bowel lesion Check blood work and referral to GI We'll check CT of the abdomen and pelvis-he is tender on exam and given the duration of his symptoms we do need to do imaging He also has a history of kidney and ureter cancer and has not had imaging recently and with his pain. Months there is concern for  recurrence, but this seems less likely  She will monitor his stool and he bleeding continues or worsens he will call and we can make sure he gets into GI as soon as possible

## 2016-01-06 ENCOUNTER — Other Ambulatory Visit: Payer: Self-pay | Admitting: Cardiology

## 2016-01-07 ENCOUNTER — Other Ambulatory Visit (INDEPENDENT_AMBULATORY_CARE_PROVIDER_SITE_OTHER): Payer: Medicare Other

## 2016-01-07 ENCOUNTER — Ambulatory Visit (INDEPENDENT_AMBULATORY_CARE_PROVIDER_SITE_OTHER): Payer: Medicare Other | Admitting: Physician Assistant

## 2016-01-07 ENCOUNTER — Encounter: Payer: Self-pay | Admitting: Physician Assistant

## 2016-01-07 VITALS — BP 138/72 | HR 75 | Ht 67.0 in | Wt 170.0 lb

## 2016-01-07 DIAGNOSIS — R1031 Right lower quadrant pain: Secondary | ICD-10-CM

## 2016-01-07 DIAGNOSIS — Z7901 Long term (current) use of anticoagulants: Secondary | ICD-10-CM

## 2016-01-07 DIAGNOSIS — R195 Other fecal abnormalities: Secondary | ICD-10-CM

## 2016-01-07 LAB — CBC WITH DIFFERENTIAL/PLATELET
BASOS ABS: 0 10*3/uL (ref 0.0–0.1)
BASOS PCT: 0.3 % (ref 0.0–3.0)
EOS ABS: 0.1 10*3/uL (ref 0.0–0.7)
Eosinophils Relative: 2 % (ref 0.0–5.0)
HCT: 43.7 % (ref 39.0–52.0)
Hemoglobin: 14.9 g/dL (ref 13.0–17.0)
LYMPHS ABS: 1.5 10*3/uL (ref 0.7–4.0)
Lymphocytes Relative: 23.1 % (ref 12.0–46.0)
MCHC: 34.1 g/dL (ref 30.0–36.0)
MCV: 91.7 fl (ref 78.0–100.0)
MONO ABS: 0.6 10*3/uL (ref 0.1–1.0)
Monocytes Relative: 9.2 % (ref 3.0–12.0)
NEUTROS ABS: 4.3 10*3/uL (ref 1.4–7.7)
NEUTROS PCT: 65.4 % (ref 43.0–77.0)
PLATELETS: 126 10*3/uL — AB (ref 150.0–400.0)
RBC: 4.77 Mil/uL (ref 4.22–5.81)
RDW: 13.4 % (ref 11.5–15.5)
WBC: 6.5 10*3/uL (ref 4.0–10.5)

## 2016-01-07 LAB — COMPREHENSIVE METABOLIC PANEL
ALT: 21 U/L (ref 0–53)
AST: 20 U/L (ref 0–37)
Albumin: 4.6 g/dL (ref 3.5–5.2)
Alkaline Phosphatase: 69 U/L (ref 39–117)
BUN: 28 mg/dL — ABNORMAL HIGH (ref 6–23)
CALCIUM: 9.4 mg/dL (ref 8.4–10.5)
CHLORIDE: 103 meq/L (ref 96–112)
CO2: 29 meq/L (ref 19–32)
CREATININE: 1.55 mg/dL — AB (ref 0.40–1.50)
GFR: 45.14 mL/min — ABNORMAL LOW (ref >60.00)
GLUCOSE: 94 mg/dL (ref 70–99)
Potassium: 4.9 mEq/L (ref 3.5–5.1)
Sodium: 137 mEq/L (ref 135–145)
Total Bilirubin: 1.1 mg/dL (ref 0.2–1.2)
Total Protein: 6.9 g/dL (ref 6.0–8.3)

## 2016-01-07 MED ORDER — PANTOPRAZOLE SODIUM 40 MG PO TBEC: 40.0000 mg | DELAYED_RELEASE_TABLET | Freq: Every day | ORAL | Status: DC

## 2016-01-07 NOTE — Progress Notes (Signed)
Reviewed. Amy, I favor conservative care of this gentleman. I would not proceed with endoscopic evaluations unless there was some overwhelming reason to consider such. He should continue on PPI indefinitely. Good data to support this recommendation. Thank you

## 2016-01-07 NOTE — Progress Notes (Signed)
Patient ID: Collin [name], male   DOB: [birth year], 82 y.o.   MRN: [id number]   Subjective:    Patient ID: Collin [name], male    DOB: 1925-08-12, 82 y.o.   MRN: [id number]  HPI Collin [name] is a pleasant 82 year old white male new to GI today referred by Dr. Billey [name] for evaluation of complaint of melena and abdominal pain. Patient says he has not had any prior GI evaluation and no prior endoscopic evaluation. He does have history of renal cell CA and is status post left nephrectomy, history of coronary artery disease for which he is maintained on Plavix and aspirin, previously followed by Dr. Ron [name] and had a stent to the RCA 7-8 years ago. He also has GERD and is status post cholecystectomy. Patient says that he started noticing dark stools intermittently about 2 weeks ago as they have been very black at times. He denies any use of Pepto-Bismol. He has also started noticing loose stools again intermittently over the past few weeks. He has not had any nausea or vomiting no dysphagia or.aphasia heartburn or indigestion. He has noticed some discomfort on the right side of his abdomen. His appetite has been fine weight has been stable. He was seen by primary care on 01/04/2015 started on Protonix at that time and also had labs done showing hemoglobin of 14.9 hematocrit of 44.4 WBC of 4.9 platelets 131 creatinine is 1.53 fine He has not seen any dark stool since yesterday.  Review of Systems Pertinent positive and negative review of systems were noted in the above HPI section.  All other review of systems was otherwise negative.  Outpatient Encounter Prescriptions as of 01/07/2016  Medication Sig  . alfuzosin (UROXATRAL) 10 MG 24 hr tablet Take 10 mg by mouth daily.   Marland Kitchen aspirin 81 MG tablet Take 81 mg by mouth daily.    . clopidogrel (PLAVIX) 75 MG tablet TAKE 1 TABLET DAILY  . finasteride (PROSCAR) 5 MG tablet Take 5 mg by mouth daily.  . Multiple Vitamin (MULTIVITAMIN) tablet Take 1 tablet by mouth daily.     . pantoprazole (PROTONIX) 40 MG tablet Take 1 tablet (40 mg total) by mouth daily. Take 30 minutes prior to breakfast.  . Probiotic Product ([employer] PROBIOTIC) CAPS Taking daily  . ranitidine (ZANTAC) 150 MG tablet Take 150 mg by mouth at bedtime.  . valsartan (DIOVAN) 160 MG tablet Take 1 tablet (160 mg total) by mouth daily.  . [DISCONTINUED] pantoprazole (PROTONIX) 40 MG tablet Take 1 tablet (40 mg total) by mouth daily. Take 30 minutes prior to breakfast.  . [DISCONTINUED] sildenafil (VIAGRA) 100 MG tablet Take 100 mg by mouth daily as needed for erectile dysfunction. Take 1/2 - 1 tablet prn last rx 01/17/13   No facility-administered encounter medications on file as of 01/07/2016.   Allergies  Allergen Reactions  . Peanut Oil Rash    sneezing  . Trandolapril     cough   Patient Active Problem List   Diagnosis Date Noted  . Myalgia and myositis 12/18/2015  . Abnormal CT of the abdomen 12/31/2013  . Abnormal chest x-ray 12/31/2013  . Gynecomastia   . Spinal stenosis, lumbar 05/12/2012  . CAD (coronary artery disease)   . Ejection fraction   . Carotid artery disease ([place])   . CERVICAL RADICULOPATHY, LEFT 12/07/2009  . SKIN CANCER, HX OF 12/07/2009  . CARCINOMA, RENAL CELL 07/18/2009  . ANEMIA, 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  . COPD 07/18/2009  . GERD 07/18/2009  . LOW  BACK PAIN SYNDROME 07/07/2009  . DYSFUNCTION OF EUSTACHIAN TUBE 04/23/2008  . Essential hypertension 04/23/2008  . REACTIVE AIRWAY DISEASE 03/28/2008  . Hyperlipidemia 01/25/2007  . Cancer ([place]) 02/28/2001   Social History   Social History  . Marital Status: Married    Spouse Name: N/A  . Number of Children: N/A  . Years of Education: N/A   Occupational History  . 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 Smoking status: Former Smoker    Types: Pipe    Quit date: 12/27/1991  . Smokeless tobacco: Not on file     Comment: Quit 20-30 years ago as of 2012  . Alcohol Use: No  . Drug Use: No    . Sexual Activity: Not on file   Other Topics Concern  . Not on file   Social History Narrative    [name] family history includes Coronary artery disease in his brother; Diabetes in his brother, mother, sister, and sister; Heart failure (age of onset: 71) in his mother.      Objective:    Filed Vitals:   01/07/16 1459  BP: 138/72  Pulse: 75    Physical Exam  well-developed elderly white male in no acute distress, blood pressure 138/72 pulse 75 height 5 foot 7 weight 170. HEENT ;nontraumatic ,normocephalic, Cardiovascular ;regular rate and rhythm with S1-S2 no murmur or gallop, Pulmonary; clear bilaterally, Abdomen ;soft, bowel sounds are active, he is tender in the right lower quadrant no palpable mass or hepatosplenomegaly, Rectal; exam stool is brown and Hemoccult negative no lesion felt, Ext; no clubbing cyanosis or edema skin warm and dry, Neuropsych; mood and affect appropriate     Assessment & Plan:   #1 82 yo male with new onset intermittent dark stool x 2-3 weeks, intermittent loose stools and right Lower abdominal discomfort Hgb is normal and stool is heme negative today R/O Right colon lesion or other inflammatory process #2 chronic antiplatelet therapy- plavix and ASA #3 CAD-s/p stent #4 Renal cell Ca- s/p left nephrectomy #5 HTN #6 renal insuff #7COPD  Plan; repeat CBC today, and BMET  Pt very reluctant to undergo endoscopic evaluation and is not acutely bleeding- will proceed with Ct abd/pelvis first - then further workup as indicated, ie colon/egd  He will continue Protonix 40 mg po daily He is advised to seek ER care should he have multiple episodes  Of dark stool in any 24 hour period   Collin Ferguson PA-C 01/07/2016   Cc: Collin Rail, MD

## 2016-01-07 NOTE — Patient Instructions (Addendum)
Please go to the basement level to have your labs drawn.  We sent refills to the pharmacy for the Pantoprazole sodium 40 mg.    You have been scheduled for a CT scan of the abdomen and pelvis at [facility] ([address]---this is in the same building as Press [facility]).   You are scheduled on 01-12-2016 at 2:30 PM . You should arrive at 2:15 minutes prior to your appointment time for registration. Please follow the written instructions below on the day of your exam:  WARNING: IF YOU ARE ALLERGIC TO IODINE/X-RAY DYE, PLEASE NOTIFY RADIOLOGY IMMEDIATELY AT [phone]! YOU WILL BE GIVEN A 13 HOUR PREMEDICATION PREP.  1) Do not eat or drink anything after 10:30 am  (4 hours prior to your test) 2) You have been given 2 bottles of oral contrast to drink. The solution may taste  better if refrigerated, but do NOT add ice or any other liquid to this solution. Shake well before drinking.    Drink 1 bottle of contrast @ 12:30 PM  (2 hours prior to your exam)  Drink 1 bottle of contrast @ 1:30 PM  (1 hour prior to your exam)  You may take any medications as prescribed with a small amount of water except for the following: Metformin, Glucophage, Glucovance, Avandamet, Riomet, Fortamet, Actoplus Met, Janumet, Glumetza or Metaglip. The above medications must be held the day of the exam AND 48 hours after the exam.  The purpose of you drinking the oral contrast is to aid in the visualization of your intestinal tract. The contrast solution may cause some diarrhea. Before your exam is started, you will be given a small amount of fluid to drink. Depending on your individual set of symptoms, you may also receive an intravenous injection of x-ray contrast/dye. Plan on being at [facility] for 30 minutes or long, depending on the type of exam you are having performed.  If you have any questions regarding your exam or if you need to reschedule, you may call the CT department at  [phone] between the hours of 8:00 am and 5:00 pm, Monday-Friday.  ________________________________________________________________________

## 2016-01-12 ENCOUNTER — Ambulatory Visit (INDEPENDENT_AMBULATORY_CARE_PROVIDER_SITE_OTHER)
Admission: RE | Admit: 2016-01-12 | Discharge: 2016-01-12 | Disposition: A | Discharge status: Home / Self Care | Payer: Medicare Other | Source: Ambulatory Visit | Attending: Physician Assistant | Admitting: Physician Assistant

## 2016-01-12 DIAGNOSIS — R10813 Right lower quadrant abdominal tenderness: Secondary | ICD-10-CM | POA: Diagnosis not present

## 2016-01-12 DIAGNOSIS — R1031 Right lower quadrant pain: Secondary | ICD-10-CM

## 2016-01-12 DIAGNOSIS — K921 Melena: Secondary | ICD-10-CM | POA: Diagnosis not present

## 2016-01-12 DIAGNOSIS — R195 Other fecal abnormalities: Secondary | ICD-10-CM

## 2016-01-12 DIAGNOSIS — Z7901 Long term (current) use of anticoagulants: Secondary | ICD-10-CM

## 2016-01-14 ENCOUNTER — Other Ambulatory Visit: Payer: Self-pay

## 2016-02-02 ENCOUNTER — Other Ambulatory Visit: Payer: Medicare Other

## 2016-03-15 ENCOUNTER — Other Ambulatory Visit: Payer: Self-pay | Admitting: Cardiology

## 2016-04-01 ENCOUNTER — Ambulatory Visit (INDEPENDENT_AMBULATORY_CARE_PROVIDER_SITE_OTHER): Payer: Medicare Other | Admitting: Cardiovascular Disease

## 2016-04-01 ENCOUNTER — Encounter: Payer: Self-pay | Admitting: Cardiovascular Disease

## 2016-04-01 VITALS — BP 122/72 | HR 62 | Ht 67.0 in | Wt 170.8 lb

## 2016-04-01 DIAGNOSIS — I10 Essential (primary) hypertension: Secondary | ICD-10-CM | POA: Diagnosis not present

## 2016-04-01 DIAGNOSIS — I2510 Atherosclerotic heart disease of native coronary artery without angina pectoris: Secondary | ICD-10-CM | POA: Diagnosis not present

## 2016-04-01 DIAGNOSIS — E785 Hyperlipidemia, unspecified: Secondary | ICD-10-CM | POA: Diagnosis not present

## 2016-04-01 NOTE — Progress Notes (Signed)
Cardiology Office Note   Date:  04/01/2016   ID:  Collin [name], DOB 22-Nov-1925, MRN [id number]  PCP:  Binnie Rail, MD  Cardiologist:  Thayer Headings, MD , previous patient of Dr. Ron Parker  Chief Complaint  Patient presents with  . Coronary Artery Disease   Problem List 1. CAD- s/p stenting in 2005.  2. Essential HTN    History of Present Illness: Collin [name] is a 82 y.o. male who presents for follow-up of coronary disease.  Had a stent placed by Dr. Olevia Perches in 2005.  No CP , no dyspnea   Previously in the telephone equipment for [employer]   Still active Walks 30 minutes a day on the treadmill.   Goes bowling twice a week Took Atorvastatin for years but stopped due to leg pain .  Last lipids were drawn in Dec.  - showed markedly elevated Trig levels and high chol levels.    Past Medical History  Diagnosis Date  . CAD (coronary artery disease) 2005    DES January 2 005, residual disease  / Myoview  . Anemia   . GERD (gastroesophageal reflux disease)   . COPD (chronic obstructive pulmonary disease) ([place])   . HTN (hypertension)   . Hyperlipidemia   . Low back pain syndrome   . Encounter for long-term (current) use of other medications   . Dysfunction of eustachian tube   . Reactive airway disease   . Thrombocytopenia ([place])   . Bladder cancer ([place])   . Hx of skin cancer, basal cell   . Ejection fraction     EF 55%, echo, January, 2009  . Pulmonary nodule     Chest CT, followed  . Carotid artery disease ([place])     Doppler, September, 2010, normal, no carotid artery disease  . Gynecomastia     b/l  . BPH (benign prostatic hyperplasia)   . Organic impotence   . Enlarged prostate   . Cancer ([place]) 02/28/2001    Left ureter cancer/bladder  . Myocardial infarction ([place])     hx acute  . Arthritis     gout  . Hypercholesterolemia   . Carcinoma, renal cell ([place])   . 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  . Allergy   . Nocturia   . Hx  of cystoscopy 11/02/12    negative  . Staph infection 04/1997    Past Surgical History  Procedure Laterality Date  . Osteomyelitis      staph aureus  . Nephrectomy  02/2001    left kidney, ureter  . Bladder tumors  2003, 2004, 01/2009    removed  . Cholecystectomy  07/2003  . Ureterectomy  2003    for cancer  . Corneal transplant  04/2002    sutured, right  . Corneal transplant  10/2008    left  . Cardiac catheterization    . Back surgery  02/1996    Patient Active Problem List   Diagnosis Date Noted  . Myalgia and myositis 12/18/2015  . Abnormal CT of the abdomen 12/31/2013  . Abnormal chest x-ray 12/31/2013  . Gynecomastia   . Spinal stenosis, lumbar 05/12/2012  . CAD (coronary artery disease)   . Ejection fraction   . Carotid artery disease ([place])   . CERVICAL RADICULOPATHY, LEFT 12/07/2009  . SKIN CANCER, HX OF 12/07/2009  . CARCINOMA, RENAL CELL 07/18/2009  . ANEMIA, 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  . COPD 07/18/2009  . GERD 07/18/2009  .  LOW BACK PAIN SYNDROME 07/07/2009  . DYSFUNCTION OF EUSTACHIAN TUBE 04/23/2008  . Essential hypertension 04/23/2008  . REACTIVE AIRWAY DISEASE 03/28/2008  . Hyperlipidemia 01/25/2007  . Cancer ([place]) 02/28/2001      Current Outpatient Prescriptions  Medication Sig Dispense Refill  . alfuzosin (UROXATRAL) 10 MG 24 hr tablet Take 10 mg by mouth daily.     Marland Kitchen aspirin 81 MG tablet Take 81 mg by mouth daily.      . clopidogrel (PLAVIX) 75 MG tablet TAKE 1 TABLET DAILY 90 tablet 0  . finasteride (PROSCAR) 5 MG tablet Take 5 mg by mouth daily.    . Multiple Vitamin (MULTIVITAMIN) tablet Take 1 tablet by mouth daily.      . pantoprazole (PROTONIX) 40 MG tablet Take 1 tablet (40 mg total) by mouth daily. Take 30 minutes prior to breakfast. 90 tablet 3  . Probiotic Product ([employer]S PROBIOTIC) CAPS Taking daily    . ranitidine (ZANTAC) 150 MG tablet Take 150 mg by mouth at bedtime.    . valsartan (DIOVAN) 160 MG tablet Take 1 tablet (160 mg total) by  mouth daily. 90 tablet 0   No current facility-administered medications for this visit.    Allergies:   Peanut oil and Trandolapril    Social History:  The patient  reports that he quit smoking about 24 years ago. His smoking use included Pipe. He does not have any smokeless tobacco history on file. He reports that he does not drink alcohol or use illicit drugs.   Family History:  The patient's family history includes Coronary artery disease in his brother; Diabetes in his brother, mother, sister, and sister; Heart failure (age of onset: 47) in his mother.    ROS:  Please see the history of present illness.  Patient denies fever, chills, headache, sweats, rash, change in vision, change in hearing, chest pain, cough, nausea or vomiting, urinary symptoms. All other systems are reviewed and are negative.     PHYSICAL EXAM: VS:  BP 122/72 mmHg  Pulse 62  Ht 5\' 7"  (1.702 m)  Wt 170 lb 12.8 oz (77.474 kg)  BMI 26.74 kg/m2 , The patient looks great. He is oriented to person time and place. Affect is normal. Head is atraumatic. Sclera and conjunctiva are normal. There is no jugulovenous distention. Lungs are clear. Respiratory effort is not labored. Cardiac exam reveals S1 and S2. The abdomen is soft. There is no peripheral edema. There are no musculoskeletal deformities. There are no skin rashes. Neurologic is grossly intact.  EKG:   EKG is done today and reviewed by me. There is normal sinus rhythm. There is no change from the past.   Recent Labs: 12/22/2015: TSH 3.26 01/07/2016: ALT 21; BUN 28*; Creatinine, Ser 1.55*; Hemoglobin 14.9; Platelets 126.0*; Potassium 4.9; Sodium 137    Lipid Panel    Component Value Date/Time   CHOL 312* 12/22/2015 0923   TRIG * 12/22/2015 0923    1044.0 Triglyceride is over 400; calculations on Lipids are invalid.   HDL 27.60* 12/22/2015 0923   CHOLHDL 11 12/22/2015 0923   VLDL 37.6 02/02/2015 0841   LDLCALC 49 02/02/2015 0841   LDLDIRECT 50.0  12/22/2015 0923      Wt Readings from Last 3 Encounters:  04/01/16 170 lb 12.8 oz (77.474 kg)  01/07/16 170 lb (77.111 kg)  01/05/16 169 lb (76.658 kg)      Current medicines are reviewed. The patient has a very good understanding of his medications. No changes are  to be made.       ASSESSMENT AND PLAN:  1. CAD- s/p stenting in 2005.  - doing well. No angina    2. Essential HTN- BP is well controlled.   3. Hyperlipidemia -   Lipids are very elevated.  Did not tolerate atorvastatin  Could consider Zetia 10 mg a day     Collin [name], Wonda Cheng, MD  04/01/2016 2:27 PM    [address],  [place], [place]  [address] Pager [phone] Phone: [phone]; Fax: [phone]   [facility]  [address] [place], [place]  [address] [phone]   Fax [phone]

## 2016-04-01 NOTE — Patient Instructions (Signed)
Medication Instructions:  Your physician recommends that you continue on your current medications as directed. Please refer to the Current Medication list given to you today.  Labwork: NONE  Testing/Procedures: NONE  Follow-Up: Your physician wants you to follow-up in: 12 months with Dr. Nahser. You will receive a reminder letter in the mail two months in advance. If you don't receive a letter, please call our office to schedule the follow-up appointment.   If you need a refill on your cardiac medications before your next appointment, please call your pharmacy.    

## 2016-04-04 ENCOUNTER — Other Ambulatory Visit: Payer: Self-pay | Admitting: Cardiology

## 2016-04-06 ENCOUNTER — Ambulatory Visit (INDEPENDENT_AMBULATORY_CARE_PROVIDER_SITE_OTHER): Payer: Medicare Other | Admitting: Internal Medicine

## 2016-04-06 ENCOUNTER — Ambulatory Visit: Payer: Medicare Other

## 2016-04-06 ENCOUNTER — Encounter: Payer: Self-pay | Admitting: Internal Medicine

## 2016-04-06 VITALS — BP 112/60 | HR 59 | Temp 97.7°F | Ht 67.0 in | Wt 170.5 lb

## 2016-04-06 DIAGNOSIS — E785 Hyperlipidemia, unspecified: Secondary | ICD-10-CM | POA: Diagnosis not present

## 2016-04-06 DIAGNOSIS — Z0000 Encounter for general adult medical examination without abnormal findings: Secondary | ICD-10-CM | POA: Diagnosis not present

## 2016-04-06 DIAGNOSIS — I2510 Atherosclerotic heart disease of native coronary artery without angina pectoris: Secondary | ICD-10-CM

## 2016-04-06 DIAGNOSIS — I10 Essential (primary) hypertension: Secondary | ICD-10-CM

## 2016-04-06 DIAGNOSIS — Z8679 Personal history of other diseases of the circulatory system: Secondary | ICD-10-CM | POA: Insufficient documentation

## 2016-04-06 MED ORDER — AMOXICILLIN 500 MG PO CAPS: ORAL_CAPSULE | ORAL | Status: DC

## 2016-04-06 MED ORDER — ALIGN 4 MG PO CAPS: ORAL_CAPSULE | ORAL | Status: DC

## 2016-04-06 NOTE — Assessment & Plan Note (Signed)
Asymptomatic Exercising regularly Continue aspirin, Plavix Did not tolerate Lipitor Recheck lipid panel-LDL has been controlled, but consider fenofibrate for elevated triglycerides

## 2016-04-06 NOTE — Assessment & Plan Note (Signed)
Did not tolerate Lipitor-muscle aches Has had very elevated triglycerides, LDL well-controlled Recheck lipid panel Consider fenofibrate if needed Continue regular exercise

## 2016-04-06 NOTE — Progress Notes (Signed)
Subjective:   Collin [name] is a 82 y.o. male who presents for Medicare Annual/Subsequent preventive examination.  Review of Systems:  HRA assessment completed during visit; Collin [name]  The Patient was informed that this wellness visit is to identify risk and educate on how to reduce risk for increase disease through lifestyle changes.   ROS deferred to CPE exam with physician  Medical and family hx Mother had DM and HF Brother and sister also DM 3 sister living; one brother living 2 brothers have died; one from DM   Medical issues  Thrombocytopenia;  Bladder cancer/ renal cell carcinoma CAD with stent 2005/ continues on Plavix   HTN; medically managed  LIPIDS 11/2015; cho 312; Trig 1044; HDL 27 and LDL 50  Fup for Triglycerides / does not eat fried foods; decreased carbs; Results for DECAMERON, HOOD (MRN [id number]) as of 04/06/2016 14:57  Ref. Range 12/07/2009 11:03 11/19/2012 14:35 01/27/2014 11:01 02/02/2015 08:41 12/22/2015 09:23  Triglycerides Latest Ref Range: 0.0-149.0 mg/dL 142.0 864.0 (H) 424.0 (H) 188.0 (H) 1044.0 Triglyceri... (H)  Was told to avoid HFCS   Tobacco: Former smoker 12/1991; Pipe; stopped approx. 82 yo  ETOH: no  Medication review/ New meds  BMI: BMI 26.6 Diet; oatmeal and banana for breakfast Lunch; usually go to [employer]; grilled chicken / vegetables Supper; eats soup or sandwich; ham and cheese;   Exercise; use the treadmill at home 30 min; 5 days a week at home Go bowling; once or twice a week / carpet to step in and out.  SAFETY; Widower; wife passes x 3 years ago; Alz (had been married 76 years)  Advertising account planner; shower in tub; 2 handles / railing;   Warehouse manager; yes;  Smoke detectors yes Firearms safety / to keep them in safe place  Driving accidents and seatbelt/ yes; no/  Sun protection/ doesn't get in the sun Stressors at times but is not bad  Depression: no depression;   Fall assessment no Gait assessment: get up go is good;   Does hire someone to care for yard Climbs ladders on occasion but advised not too;   Mobilization and Functional losses in the last year. /no  Sleep patterns/ not great; varies; stay up late; 11:30 and 12 mn;  Get up at 7am; stays to 8am sometimes  Children and grands are in [place] and [place]  Has older brother in [place]; seen him x 1   Urinary or fecal incontinence reviewed see [facility] for urine issues  Counseling: Colonoscopy; aged out  EKG:/ 03/2016 Hearing: 2000hz  both ears Ophthalmology exam; eye exam every year; corneal transplants; OD 2003; LS 2009 / vision is "good enough" Doctor from [facility] sees annually;  Immunizations: Tetanus due;  Zostavax; had shingles at least 10 years ago;   PCV 13 due/ no pneumonia series/ states he had pneumonia; 1998 PSV 23  Discussed prevnar and will take of Dr. Quay Burow advises him to but then stated he was sick today; no temp; has cold; was around church member yesterday that was sick; sore throat; runny nose;   Tdap; not sure when he had one; Retired in 38; (93 he was 24)  States he has a cold today  Leave it up to her;   Scientist, physiological; completed; son is now his [name] or referrals Discussed low cholesterol eating  Current Care Team reviewed and updated   Cardiac Risk Factors include: advanced age (>9men, >61 women);dyslipidemia;hypertension;male gender     Objective:  Vitals: BP 112/60 mmHg  Pulse 59  Temp(Src) 97.7 F (36.5 C) (Oral)  Ht 5\' 7"  (1.702 m)  Wt 170 lb 8 oz (77.338 kg)  BMI 26.70 kg/m2  SpO2 96%  Body mass index is 26.7 kg/(m^2).  Tobacco History  Smoking status  . Former Smoker  . Types: Pipe  . Quit date: 12/27/1991  Smokeless tobacco  . Not on file    Comment: Quit 20-30 years ago as of 2012     Counseling given: Yes   Past Medical History  Diagnosis Date  . CAD (coronary artery disease) 2005    DES January 2 005, residual disease  / Myoview  . Anemia   . GERD (gastroesophageal reflux disease)     . COPD (chronic obstructive pulmonary disease) ([place])   . HTN (hypertension)   . Hyperlipidemia   . Low back pain syndrome   . Encounter for long-term (current) use of other medications   . Dysfunction of eustachian tube   . Reactive airway disease   . Thrombocytopenia ([place])   . Bladder cancer ([place])   . Hx of skin cancer, basal cell   . Ejection fraction     EF 55%, echo, January, 2009  . Pulmonary nodule     Chest CT, followed  . Carotid artery disease ([place])     Doppler, September, 2010, normal, no carotid artery disease  . Gynecomastia     b/l  . BPH (benign prostatic hyperplasia)   . Organic impotence   . Enlarged prostate   . Cancer ([place]) 02/28/2001    Left ureter cancer/bladder  . Myocardial infarction ([place])     hx acute  . Arthritis     gout  . Hypercholesterolemia   . Carcinoma, renal cell ([place])   . 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  . Allergy   . Nocturia   . Hx of cystoscopy 11/02/12    negative  . Staph infection 04/1997   Past Surgical History  Procedure Laterality Date  . Osteomyelitis      staph aureus  . Nephrectomy  02/2001    left kidney, ureter  . Bladder tumors  2003, 2004, 01/2009    removed  . Cholecystectomy  07/2003  . Ureterectomy  2003    for cancer  . Corneal transplant  04/2002    sutured, right  . Corneal transplant  10/2008    left  . Cardiac catheterization    . Back surgery  02/1996   Family History  Problem Relation Age of Onset  . Heart failure Mother 84  . Diabetes Mother   . Coronary artery disease Brother   . Diabetes Brother   . Drug abuse Brother   . Diabetes Sister   . Diabetes Sister    History  Sexual Activity  . Sexual Activity: Not on file    Outpatient Encounter Prescriptions as of 04/06/2016  Medication Sig  . alfuzosin (UROXATRAL) 10 MG 24 hr tablet Take 10 mg by mouth daily.   Marland Kitchen aspirin 81 MG tablet Take 81 mg by mouth daily.    . clopidogrel (PLAVIX) 75 MG tablet TAKE 1 TABLET  DAILY  . finasteride (PROSCAR) 5 MG tablet Take 5 mg by mouth daily.  . Multiple Vitamin (MULTIVITAMIN) tablet Take 1 tablet by mouth daily.    . pantoprazole (PROTONIX) 40 MG tablet Take 1 tablet (40 mg total) by mouth daily. Take 30 minutes prior to breakfast.  . Probiotic Product ([employer]  PROBIOTIC) CAPS Taking daily  . ranitidine (ZANTAC) 150 MG tablet Take 150 mg by mouth at bedtime.  . valsartan (DIOVAN) 160 MG tablet Take 1 tablet (160 mg total) by mouth daily.   No facility-administered encounter medications on file as of 04/06/2016.    Activities of Daily Living In your present state of health, do you have any difficulty performing the following activities: 04/06/2016  Hearing? N  Vision? N  Difficulty concentrating or making decisions? N  Walking or climbing stairs? N  Dressing or bathing? N  Doing errands, shopping? N  Preparing Food and eating ? N  Using the Toilet? N  In the past six months, have you accidently leaked urine? N  Do you have problems with loss of bowel control? N  Managing your Medications? N  Managing your Finances? N  Housekeeping or managing your Housekeeping? N    Patient Care Team: Binnie Rail, MD as PCP - General (Internal Medicine)   Assessment:     Exercise Activities and Dietary recommendations Current Exercise Habits: Structured exercise class;Home exercise routine, Time (Minutes): 30, Frequency (Times/Week): 5 (plus bowling), Weekly Exercise (Minutes/Week): 150, Intensity: Moderate  Goals    . patient     Find healthy snacks;        Fall Risk Fall Risk  04/06/2016 03/19/2014  Falls in the past year? No No   Depression Screen PHQ 2/9 Scores 04/06/2016 03/19/2014  PHQ - 2 Score 0 0    Cognitive Testing MMSE - Mini Mental State Exam 04/06/2016  Not completed: (No Data)     AD8 score 0; no issues   Immunization History  Administered Date(s) Administered  . Influenza Whole 09/17/2008, 07/26/2012, 09/10/2013  . Influenza, High Dose  Seasonal PF 08/29/2014  . Influenza-Unspecified 08/31/2015   Screening Tests Health Maintenance  Topic Date Due  . TETANUS/TDAP  06/17/1946  . ZOSTAVAX  06/18/1987  . PNA vac Low Risk Adult (1 of 2 - PCV13) 06/17/1992  . INFLUENZA VACCINE  07/26/2016      Plan:     Low cholesterol diet;  During the course of the visit the patient was educated and counseled about the following appropriate screening and preventive services:   Vaccines to include Pneumoccal, Influenza, Hepatitis B, Td, Zostavax, HCV/ prevnar but not feeling well today  Electrocardiogram /03/2016  Cardiovascular Disease/ deferred to cardiology  Trig; states referred to MD; doesn't eat many fats; BMI good; eats at [employer]  Colorectal cancer screening; thinks he has not had one;   Diabetes screening/ neg  Prostate Cancer Screening/ deferred  Glaucoma screening/ followed with eye exams every year  Nutrition counseling / discussed Trig level;   Smoking cessation counseling; hx of using pipe; quit 1993  Patient Instructions (the written plan) was given to the patient.    Wynetta Fines RN  04/06/2016   Medical screening examination/treatment/procedure(s) were performed by non-physician practitioner and as supervising physician I was immediately available for consultation/collaboration. I agree with above. Binnie Rail, MD

## 2016-04-06 NOTE — Progress Notes (Signed)
Pre visit review using our clinic review tool, if applicable. No additional management support is needed unless otherwise documented below in the visit note. 

## 2016-04-06 NOTE — Patient Instructions (Addendum)
Have fasting blood work done when you can.    Collin [name] , Thank you for taking time to come for your Medicare Wellness Visit. I appreciate your ongoing commitment to your health goals. Please review the following plan we discussed and let me know if I can assist you in the future.    These are the goals we discussed: Goals    . patient     Find healthy snacks;         This is a list of the screening recommended for you and due dates:  Health Maintenance  Topic Date Due  . Tetanus Vaccine  06[birth year]17/1946  . Shingles Vaccine  06/18/1987  . Pneumonia vaccines (1 of 2 - PCV13) 06/17/1992  . Flu Shot  07/26/2016    Fat and Cholesterol Restricted Diet High levels of fat and cholesterol in your blood may lead to various health problems, such as diseases of the heart, blood vessels, gallbladder, liver, and pancreas. Fats are concentrated sources of energy that come in various forms. Certain types of fat, including saturated fat, may be harmful in excess. Cholesterol is a substance needed by your body in small amounts. Your body makes all the cholesterol it needs. Excess cholesterol comes from the food you eat. When you have high levels of cholesterol and saturated fat in your blood, health problems can develop because the excess fat and cholesterol will gather along the walls of your blood vessels, causing them to narrow. Choosing the right foods will help you control your intake of fat and cholesterol. This will help keep the levels of these substances in your blood within normal limits and reduce your risk of disease. WHAT IS MY PLAN? Your health care provider recommends that you:  Get no more than __________ % of the total calories in your daily diet from fat.  Limit your intake of saturated fat to less than ______% of your total calories each day.  Limit the amount of cholesterol in your diet to less than _________mg per day. WHAT TYPES OF FAT SHOULD I CHOOSE?  Choose healthy fats more  often. Choose monounsaturated and polyunsaturated fats, such as olive and canola oil, flaxseeds, walnuts, almonds, and seeds.  Eat more omega-3 fats. Good choices include salmon, mackerel, sardines, tuna, flaxseed oil, and ground flaxseeds. Aim to eat fish at least two times a week.  Limit saturated fats. Saturated fats are primarily found in animal products, such as meats, butter, and cream. Plant sources of saturated fats include palm oil, palm kernel oil, and coconut oil.  Avoid foods with partially hydrogenated oils in them. These contain trans fats. Examples of foods that contain trans fats are stick margarine, some tub margarines, cookies, crackers, and other baked goods. WHAT GENERAL GUIDELINES DO I NEED TO FOLLOW? These guidelines for healthy eating will help you control your intake of fat and cholesterol:  Check food labels carefully to identify foods with trans fats or high amounts of saturated fat.  Fill one half of your plate with vegetables and green salads.  Fill one fourth of your plate with whole grains. Look for the word "whole" as the first word in the ingredient list.  Fill one fourth of your plate with lean protein foods.  Limit fruit to two servings a day. Choose fruit instead of juice.  Eat more foods that contain soluble fiber. Examples of foods that contain this type of fiber are apples, broccoli, carrots, beans, peas, and barley. Aim to get 20-30 g of fiber  per day.  Eat more home-cooked food and less restaurant, buffet, and fast food.  Limit or avoid alcohol.  Limit foods high in starch and sugar.  Limit fried foods.  Cook foods using methods other than frying. Baking, boiling, grilling, and broiling are all great options.  Lose weight if you are overweight. Losing just 5-10% of your initial body weight can help your overall health and prevent diseases such as diabetes and heart disease. WHAT FOODS CAN I EAT? Grains Whole grains, such as whole wheat or  whole grain breads, crackers, cereals, and pasta. Unsweetened oatmeal, bulgur, barley, quinoa, or brown rice. Corn or whole wheat flour tortillas. Vegetables Fresh or frozen vegetables (raw, steamed, roasted, or grilled). Green salads. Fruits All fresh, canned (in natural juice), or frozen fruits. Meat and Other Protein Products Ground beef (85% or leaner), grass-fed beef, or beef trimmed of fat. Skinless chicken or [place]. Ground chicken or [place]. Pork trimmed of fat. All fish and seafood. Eggs. Dried beans, peas, or lentils. Unsalted nuts or seeds. Unsalted canned or dry beans. Dairy Low-fat dairy products, such as skim or 1% milk, 2% or reduced-fat cheeses, low-fat ricotta or cottage cheese, or plain low-fat yogurt. Fats and Oils Tub margarines without trans fats. Light or reduced-fat mayonnaise and salad dressings. Avocado. Olive, canola, sesame, or safflower oils. Natural peanut or almond butter (choose ones without added sugar and oil). The items listed above may not be a complete list of recommended foods or beverages. Contact your dietitian for more options. WHAT FOODS ARE NOT RECOMMENDED? Grains White bread. White pasta. White rice. Cornbread. Bagels, pastries, and croissants. Crackers that contain trans fat. Vegetables White potatoes. Corn. Creamed or fried vegetables. Vegetables in a cheese sauce. Fruits Dried fruits. Canned fruit in light or heavy syrup. Fruit juice. Meat and Other Protein Products Fatty cuts of meat. Ribs, chicken wings, bacon, sausage, bologna, salami, chitterlings, fatback, hot dogs, bratwurst, and packaged luncheon meats. Liver and organ meats. Dairy Whole or 2% milk, cream, half-and-half, and cream cheese. Whole milk cheeses. Whole-fat or sweetened yogurt. Full-fat cheeses. Nondairy creamers and whipped toppings. Processed cheese, cheese spreads, or cheese curds. Sweets and Desserts Corn syrup, sugars, honey, and molasses. Candy. Jam and jelly. Syrup.  Sweetened cereals. Cookies, pies, cakes, donuts, muffins, and ice cream. Fats and Oils Butter, stick margarine, lard, shortening, ghee, or bacon fat. Coconut, palm kernel, or palm oils. Beverages Alcohol. Sweetened drinks (such as sodas, lemonade, and fruit drinks or punches). The items listed above may not be a complete list of foods and beverages to avoid. Contact your dietitian for more information.   This information is not intended to replace advice given to you by your health care provider. Make sure you discuss any questions you have with your health care provider.   Document Released: 12/12/2005 Document Revised: 01/02/2015 Document Reviewed: 03/12/2014 Elsevier Interactive Patient Education [employer].

## 2016-04-06 NOTE — Assessment & Plan Note (Signed)
BP well controlled Current regimen effective and well tolerated Continue current medications at current doses Check CMP

## 2016-04-06 NOTE — Addendum Note (Signed)
Addended by: Binnie Rail on: 04/06/2016 08:35 PM   Modules accepted: Level of Service

## 2016-04-06 NOTE — Progress Notes (Signed)
Subjective:    Patient ID: Collin [name], male    DOB: 1925/10/30, 82 y.o.   MRN: [id number]  HPI He is here for a physical exam.   He does eat more carbs than he should - snacks at night.  He did discuss this with Manuela Schwartz during his wellness visit and will work on decreasing his carbohydrates. He is exercising regularly.  He is experiencing some mild cold symptoms, but is not concerned about it. He has no other concerns.  He is taking all of his medications daily as prescribed.      Medications and allergies reviewed with patient and updated if appropriate.  Patient Active Problem List   Diagnosis Date Noted  . Myalgia and myositis 12/18/2015  . Abnormal CT of the abdomen 12/31/2013  . Abnormal chest x-ray 12/31/2013  . Gynecomastia   . Spinal stenosis, lumbar 05/12/2012  . CAD (coronary artery disease)   . Ejection fraction   . Carotid artery disease ([place])   . CERVICAL RADICULOPATHY, LEFT 12/07/2009  . SKIN CANCER, HX OF 12/07/2009  . CARCINOMA, RENAL CELL 07/18/2009  . ANEMIA, 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  . COPD 07/18/2009  . GERD 07/18/2009  . LOW BACK PAIN SYNDROME 07/07/2009  . DYSFUNCTION OF EUSTACHIAN TUBE 04/23/2008  . Essential hypertension 04/23/2008  . REACTIVE AIRWAY DISEASE 03/28/2008  . Hyperlipidemia 01/25/2007  . Cancer ([place]) 02/28/2001    Current Outpatient Prescriptions on File Prior to Visit  Medication Sig Dispense Refill  . alfuzosin (UROXATRAL) 10 MG 24 hr tablet Take 10 mg by mouth daily.     Marland Kitchen aspirin 81 MG tablet Take 81 mg by mouth daily.      . clopidogrel (PLAVIX) 75 MG tablet TAKE 1 TABLET DAILY 90 tablet 0  . finasteride (PROSCAR) 5 MG tablet Take 5 mg by mouth daily.    . Multiple Vitamin (MULTIVITAMIN) tablet Take 1 tablet by mouth daily.      . pantoprazole (PROTONIX) 40 MG tablet Take 1 tablet (40 mg total) by mouth daily. Take 30 minutes prior to breakfast. 90 tablet 3  . Probiotic Product ([employer] PROBIOTIC) CAPS Taking daily    .  ranitidine (ZANTAC) 150 MG tablet Take 150 mg by mouth at bedtime.    . valsartan (DIOVAN) 160 MG tablet Take 1 tablet (160 mg total) by mouth daily. 90 tablet 3   No current facility-administered medications on file prior to visit.    Past Medical History  Diagnosis Date  . CAD (coronary artery disease) 2005    DES January 2 005, residual disease  / Myoview  . Anemia   . GERD (gastroesophageal reflux disease)   . COPD (chronic obstructive pulmonary disease) ([place])   . HTN (hypertension)   . Hyperlipidemia   . Low back pain syndrome   . Encounter for long-term (current) use of other medications   . Dysfunction of eustachian tube   . Reactive airway disease   . Thrombocytopenia ([place])   . Bladder cancer ([place])   . Hx of skin cancer, basal cell   . Ejection fraction     EF 55%, echo, January, 2009  . Pulmonary nodule     Chest CT, followed  . Carotid artery disease ([place])     Doppler, September, 2010, normal, no carotid artery disease  . Gynecomastia     b/l  . BPH (benign prostatic hyperplasia)   . Organic impotence   . Enlarged prostate   . Cancer ([place]) 02/28/2001    Left  ureter cancer/bladder  . Myocardial infarction ([place])     hx acute  . Arthritis     gout  . Hypercholesterolemia   . Carcinoma, renal cell ([place])   . 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  . Allergy   . Nocturia   . Hx of cystoscopy 11/02/12    negative  . Staph infection 04/1997    Past Surgical History  Procedure Laterality Date  . Osteomyelitis      staph aureus  . Nephrectomy  02/2001    left kidney, ureter  . Bladder tumors  2003, 2004, 01/2009    removed  . Cholecystectomy  07/2003  . Ureterectomy  2003    for cancer  . Corneal transplant  04/2002    sutured, right  . Corneal transplant  10/2008    left  . Cardiac catheterization    . Back surgery  02/1996    Social History   Social History  . Marital Status: Married    Spouse Name: N/A  . Number of Children: N/A    . Years of Education: N/A   Occupational History  . 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 Smoking status: Former Smoker    Types: Pipe    Quit date: 12/27/1991  . Smokeless tobacco: Not on file     Comment: Quit 20-30 years ago as of 2012  . Alcohol Use: No  . Drug Use: No  . Sexual Activity: Not on file   Other Topics Concern  . Not on file   Social History Narrative    Family History  Problem Relation Age of Onset  . Heart failure Mother 59  . Diabetes Mother   . Coronary artery disease Brother   . Diabetes Brother   . Drug abuse Brother   . Diabetes Sister   . Diabetes Sister     Review of Systems  Constitutional: Negative for fever, chills, appetite change and fatigue.  HENT: Positive for congestion and sore throat. Negative for ear pain, hearing loss and sinus pressure.   Eyes: Negative for visual disturbance.  Respiratory: Positive for shortness of breath (mild with exertion). Negative for cough and wheezing.   Cardiovascular: Negative for chest pain, palpitations and leg swelling.  Gastrointestinal: Negative for nausea, abdominal pain, diarrhea, constipation and blood in stool.       No gerd  Genitourinary: Negative for dysuria.  Musculoskeletal: Positive for back pain (chronic ) and arthralgias (mild arthritis). Negative for myalgias.  Neurological: Negative for dizziness, weakness, light-headedness, numbness and headaches.  Psychiatric/Behavioral: Negative for sleep disturbance and dysphoric mood. The patient is not nervous/anxious.        Objective:   Filed Vitals:   04/06/16 1433  BP: 112/60  Pulse: 59  Temp: 97.7 F (36.5 C)   Filed Weights   04/06/16 1433  Weight: 170 lb 8 oz (77.338 kg)   Body mass index is 26.7 kg/(m^2).   Physical Exam Constitutional: He appears well-developed and well-nourished. No distress.  HENT:  Head: Normocephalic and atraumatic.  Right Ear: External ear normal.  Left Ear: External ear  normal.  Mouth/Throat: Oropharynx is clear and moist.  Normal ear canals and TM b/l  Eyes: Conjunctivae and EOM are normal.  Neck: Neck supple. No tracheal deviation present. No thyromegaly present.  No carotid bruit  Cardiovascular: Normal rate, regular rhythm, normal heart sounds and intact distal pulses.   2/6 systolic murmur Pulmonary/Chest:  Effort normal and breath sounds normal. No respiratory distress. He has no wheezes. He has no rales.  Abdominal: Soft. Bowel sounds are normal. He exhibits no distension. There is no tenderness.  Genitourinary: deferred  Musculoskeletal: He exhibits no edema.  Lymphadenopathy:    He has no cervical adenopathy.  Skin: Skin is warm and dry. He is not diaphoretic.  Psychiatric: He has a normal mood and affect. His behavior is normal.         Assessment & Plan:    Physical exam: Screening blood work-Ordered will resume Immunizations-has current cold symptoms so we will hold off on further, tetanus and Zostavax discussed during his wellness visit-not covered by insurance Colonoscopy-not indicated at this age Eye exams-up-to-date EKG - up-to-date, done by cardiology Exercise-exercising regularly Weight -BMI is very good for his age and has been stable Skin  - sees dermatology Substance abuse-no evidence of abuse   See Problem List for Assessment and Plan of chronic medical problems.

## 2016-04-13 ENCOUNTER — Other Ambulatory Visit (INDEPENDENT_AMBULATORY_CARE_PROVIDER_SITE_OTHER): Payer: Medicare Other

## 2016-04-13 DIAGNOSIS — E785 Hyperlipidemia, unspecified: Secondary | ICD-10-CM | POA: Diagnosis not present

## 2016-04-13 DIAGNOSIS — I2510 Atherosclerotic heart disease of native coronary artery without angina pectoris: Secondary | ICD-10-CM

## 2016-04-13 LAB — CBC WITH DIFFERENTIAL/PLATELET
BASOS ABS: 0 10*3/uL (ref 0.0–0.1)
BASOS PCT: 0.4 % (ref 0.0–3.0)
EOS ABS: 0.2 10*3/uL (ref 0.0–0.7)
Eosinophils Relative: 3.8 % (ref 0.0–5.0)
HEMATOCRIT: 39.7 % (ref 39.0–52.0)
HEMOGLOBIN: 13.6 g/dL (ref 13.0–17.0)
LYMPHS PCT: 22.1 % (ref 12.0–46.0)
Lymphs Abs: 1.4 10*3/uL (ref 0.7–4.0)
MCHC: 34.3 g/dL (ref 30.0–36.0)
MCV: 90.8 fl (ref 78.0–100.0)
MONOS PCT: 15.4 % — AB (ref 3.0–12.0)
Monocytes Absolute: 0.9 10*3/uL (ref 0.1–1.0)
Neutro Abs: 3.6 10*3/uL (ref 1.4–7.7)
Neutrophils Relative %: 58.3 % (ref 43.0–77.0)
Platelets: 126 10*3/uL — ABNORMAL LOW (ref 150.0–400.0)
RBC: 4.37 Mil/uL (ref 4.22–5.81)
RDW: 13.2 % (ref 11.5–15.5)
WBC: 6.1 10*3/uL (ref 4.0–10.5)

## 2016-04-13 LAB — COMPREHENSIVE METABOLIC PANEL
ALBUMIN: 4.2 g/dL (ref 3.5–5.2)
ALT: 15 U/L (ref 0–53)
AST: 14 U/L (ref 0–37)
Alkaline Phosphatase: 72 U/L (ref 39–117)
BUN: 21 mg/dL (ref 6–23)
CALCIUM: 9.2 mg/dL (ref 8.4–10.5)
CHLORIDE: 104 meq/L (ref 96–112)
CO2: 30 mEq/L (ref 19–32)
Creatinine, Ser: 1.6 mg/dL — ABNORMAL HIGH (ref 0.40–1.50)
GFR: 43.49 mL/min — AB (ref >60.00)
Glucose, Bld: 113 mg/dL — ABNORMAL HIGH (ref 70–99)
Potassium: 4.8 mEq/L (ref 3.5–5.1)
Sodium: 139 mEq/L (ref 135–145)
Total Bilirubin: 1.3 mg/dL — ABNORMAL HIGH (ref 0.2–1.2)
Total Protein: 6.9 g/dL (ref 6.0–8.3)

## 2016-04-13 LAB — LIPID PANEL
CHOL/HDL RATIO: 6
Cholesterol: 161 mg/dL (ref 0–200)
HDL: 28 mg/dL — AB (ref >39.00)
NONHDL: 132.59
TRIGLYCERIDES: 243 mg/dL — AB (ref 0.0–149.0)
VLDL: 48.6 mg/dL — ABNORMAL HIGH (ref 0.0–40.0)

## 2016-04-13 LAB — LDL CHOLESTEROL, DIRECT: Direct LDL: 72 mg/dL

## 2016-04-14 ENCOUNTER — Encounter: Payer: Self-pay | Admitting: Internal Medicine

## 2016-04-14 DIAGNOSIS — E781 Pure hyperglyceridemia: Secondary | ICD-10-CM | POA: Insufficient documentation

## 2016-05-10 DIAGNOSIS — L309 Dermatitis, unspecified: Secondary | ICD-10-CM | POA: Diagnosis not present

## 2016-05-10 DIAGNOSIS — D485 Neoplasm of uncertain behavior of skin: Secondary | ICD-10-CM | POA: Diagnosis not present

## 2016-05-11 DIAGNOSIS — L820 Inflamed seborrheic keratosis: Secondary | ICD-10-CM | POA: Diagnosis not present

## 2016-05-11 DIAGNOSIS — D485 Neoplasm of uncertain behavior of skin: Secondary | ICD-10-CM | POA: Diagnosis not present

## 2016-05-11 DIAGNOSIS — L309 Dermatitis, unspecified: Secondary | ICD-10-CM | POA: Diagnosis not present

## 2016-05-18 ENCOUNTER — Other Ambulatory Visit: Payer: Self-pay | Admitting: Internal Medicine

## 2016-05-18 ENCOUNTER — Telehealth: Payer: Self-pay | Admitting: Emergency Medicine

## 2016-05-18 NOTE — Telephone Encounter (Signed)
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

## 2016-05-18 NOTE — Telephone Encounter (Signed)
Please advise 

## 2016-05-18 NOTE — Telephone Encounter (Signed)
Received call from Pts Dentist office. They would like to know the DX for why the patient has to take amoxicillin before dental work. Pt is in the office today and forgot to take his antibiotic.   CBWarren Lacy [phone] Dr Loma Newton

## 2016-05-18 NOTE — Telephone Encounter (Signed)
Spoke with Amy to inform.

## 2016-06-13 ENCOUNTER — Other Ambulatory Visit (INDEPENDENT_AMBULATORY_CARE_PROVIDER_SITE_OTHER): Payer: Medicare Other

## 2016-06-13 ENCOUNTER — Ambulatory Visit (INDEPENDENT_AMBULATORY_CARE_PROVIDER_SITE_OTHER): Payer: Medicare Other | Admitting: Internal Medicine

## 2016-06-13 ENCOUNTER — Encounter: Payer: Self-pay | Admitting: Internal Medicine

## 2016-06-13 DIAGNOSIS — R21 Rash and other nonspecific skin eruption: Secondary | ICD-10-CM

## 2016-06-13 DIAGNOSIS — I2510 Atherosclerotic heart disease of native coronary artery without angina pectoris: Secondary | ICD-10-CM

## 2016-06-13 DIAGNOSIS — R198 Other specified symptoms and signs involving the digestive system and abdomen: Secondary | ICD-10-CM

## 2016-06-13 LAB — COMPREHENSIVE METABOLIC PANEL
ALBUMIN: 4.2 g/dL (ref 3.5–5.2)
ALT: 16 U/L (ref 0–53)
AST: 13 U/L (ref 0–37)
Alkaline Phosphatase: 78 U/L (ref 39–117)
BUN: 31 mg/dL — ABNORMAL HIGH (ref 6–23)
CALCIUM: 9.2 mg/dL (ref 8.4–10.5)
CHLORIDE: 103 meq/L (ref 96–112)
CO2: 29 mEq/L (ref 19–32)
CREATININE: 1.59 mg/dL — AB (ref 0.40–1.50)
GFR: 43.79 mL/min — ABNORMAL LOW (ref >60.00)
Glucose, Bld: 69 mg/dL — ABNORMAL LOW (ref 70–99)
POTASSIUM: 4.9 meq/L (ref 3.5–5.1)
Sodium: 138 mEq/L (ref 135–145)
Total Bilirubin: 0.8 mg/dL (ref 0.2–1.2)
Total Protein: 6.7 g/dL (ref 6.0–8.3)

## 2016-06-13 LAB — CBC WITH DIFFERENTIAL/PLATELET
BASOS PCT: 0.8 % (ref 0.0–3.0)
Basophils Absolute: 0 10*3/uL (ref 0.0–0.1)
EOS ABS: 0.2 10*3/uL (ref 0.0–0.7)
EOS PCT: 3.7 % (ref 0.0–5.0)
HEMATOCRIT: 39.6 % (ref 39.0–52.0)
HEMOGLOBIN: 13.5 g/dL (ref 13.0–17.0)
LYMPHS PCT: 26.1 % (ref 12.0–46.0)
Lymphs Abs: 1.1 10*3/uL (ref 0.7–4.0)
MCHC: 34.2 g/dL (ref 30.0–36.0)
MCV: 91.3 fl (ref 78.0–100.0)
Monocytes Absolute: 0.5 10*3/uL (ref 0.1–1.0)
Monocytes Relative: 12.3 % — ABNORMAL HIGH (ref 3.0–12.0)
Neutro Abs: 2.4 10*3/uL (ref 1.4–7.7)
Neutrophils Relative %: 57.1 % (ref 43.0–77.0)
Platelets: 133 10*3/uL — ABNORMAL LOW (ref 150.0–400.0)
RBC: 4.34 Mil/uL (ref 4.22–5.81)
RDW: 13.5 % (ref 11.5–15.5)
WBC: 4.2 10*3/uL (ref 4.0–10.5)

## 2016-06-13 LAB — C-REACTIVE PROTEIN: CRP: 0.1 mg/dL — ABNORMAL LOW (ref 0.5–20.0)

## 2016-06-13 LAB — SEDIMENTATION RATE: SED RATE: 1 mm/h (ref 0–20)

## 2016-06-13 MED ORDER — HYDROXYZINE HCL 25 MG PO TABS: 25.0000 mg | ORAL_TABLET | Freq: Every evening | ORAL | Status: DC | PRN

## 2016-06-13 NOTE — Progress Notes (Signed)
Subjective:    Patient ID: Collin [name], male    DOB: 10-Dec-1925, 82 y.o.   MRN: [id number]  HPI He is here for an acute visit.   Rash:  His rash started with itchy brown bumps all over his body.  It started about 6 months ago and has spread.  He saw dermatology and they prescribed triamcinolone, then permethrin (they did not think it was scabies), clobetasol and repeated permethrin.  Nothing helped.  He is using aveno cream and taking benadryl.  It has gotten worse.  Left upper quadrante swelling:  He noticed swelling or a prominence in her LUQ at least a couple of months ago.  He denies pain.  He denies changes in his bowel movements or blood in his stool.    Medications and allergies reviewed with patient and updated if appropriate.  Patient Active Problem List   Diagnosis Date Noted  . Hypertriglyceridemia 04/14/2016  . H/O bacterial endocarditis 04/06/2016  . Myalgia and myositis 12/18/2015  . Abnormal CT of the abdomen 12/31/2013  . Abnormal chest x-ray 12/31/2013  . Gynecomastia   . Spinal stenosis, lumbar 05/12/2012  . CAD (coronary artery disease)   . Ejection fraction   . Carotid artery disease ([place])   . CERVICAL RADICULOPATHY, LEFT 12/07/2009  . SKIN CANCER, HX OF 12/07/2009  . CARCINOMA, RENAL CELL 07/18/2009  . ANEMIA, 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  . COPD 07/18/2009  . GERD 07/18/2009  . LOW BACK PAIN SYNDROME 07/07/2009  . DYSFUNCTION OF EUSTACHIAN TUBE 04/23/2008  . Essential hypertension 04/23/2008  . REACTIVE AIRWAY DISEASE 03/28/2008  . Hyperlipidemia 01/25/2007  . Cancer ([place]) 02/28/2001    Current Outpatient Prescriptions on File Prior to Visit  Medication Sig Dispense Refill  . alfuzosin (UROXATRAL) 10 MG 24 hr tablet Take 10 mg by mouth daily.     Marland Kitchen amoxicillin (AMOXIL) 500 MG capsule TAKE 4 CAPSULES BY MOUTH 1 HOUR BEFORE DENTAL APPOINTMENT 4 capsule 0  . aspirin 81 MG tablet Take 81 mg by mouth daily.      . clopidogrel (PLAVIX) 75 MG tablet TAKE 1  TABLET DAILY 90 tablet 0  . finasteride (PROSCAR) 5 MG tablet Take 5 mg by mouth daily.    . Multiple Vitamin (MULTIVITAMIN) tablet Take 1 tablet by mouth daily.      . pantoprazole (PROTONIX) 40 MG tablet Take 1 tablet (40 mg total) by mouth daily. Take 30 minutes prior to breakfast. 90 tablet 3  . Probiotic Product (ALIGN) 4 MG CAPS Taking daily    . ranitidine (ZANTAC) 150 MG tablet Take 150 mg by mouth at bedtime.    . valsartan (DIOVAN) 160 MG tablet Take 1 tablet (160 mg total) by mouth daily. 90 tablet 3   No current facility-administered medications on file prior to visit.    Past Medical History  Diagnosis Date  . CAD (coronary artery disease) 2005    DES January 2 005, residual disease  / Myoview  . Anemia   . GERD (gastroesophageal reflux disease)   . COPD (chronic obstructive pulmonary disease) ([place])   . HTN (hypertension)   . Hyperlipidemia   . Low back pain syndrome   . Encounter for long-term (current) use of other medications   . Dysfunction of eustachian tube   . Reactive airway disease   . Thrombocytopenia ([place])   . Bladder cancer ([place])   . Hx of skin cancer, basal cell   . Ejection fraction     EF 55%, echo,  January, 2009  . Pulmonary nodule     Chest CT, followed  . Carotid artery disease ([place])     Doppler, September, 2010, normal, no carotid artery disease  . Gynecomastia     b/l  . BPH (benign prostatic hyperplasia)   . Organic impotence   . Enlarged prostate   . Cancer ([place]) 02/28/2001    Left ureter cancer/bladder  . Myocardial infarction ([place])     hx acute  . Arthritis     gout  . Hypercholesterolemia   . Carcinoma, renal cell ([place])   . 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  . Allergy   . Nocturia   . Hx of cystoscopy 11/02/12    negative  . Staph infection 04/1997    Past Surgical History  Procedure Laterality Date  . Osteomyelitis      staph aureus  . Nephrectomy  02/2001    left kidney, ureter  . Bladder tumors   2003, 2004, 01/2009    removed  . Cholecystectomy  07/2003  . Ureterectomy  2003    for cancer  . Corneal transplant  04/2002    sutured, right  . Corneal transplant  10/2008    left  . Cardiac catheterization    . Back surgery  02/1996    Social History   Social History  . Marital Status: Married    Spouse Name: N/A  . Number of Children: N/A  . Years of Education: N/A   Occupational History  . 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 Smoking status: Former Smoker    Types: Pipe    Quit date: 12/27/1991  . Smokeless tobacco: Not on file     Comment: Quit 20-30 years ago as of 2012  . Alcohol Use: No  . Drug Use: No  . Sexual Activity: Not on file   Other Topics Concern  . Not on file   Social History Narrative   30 minutes a day for 5 days a week    Family History  Problem Relation Age of Onset  . Heart failure Mother 3  . Diabetes Mother   . Coronary artery disease Brother   . Diabetes Brother   . Drug abuse Brother   . Diabetes Sister   . Diabetes Sister     Review of Systems  Constitutional: Negative for fever, chills, appetite change and fatigue.  Respiratory: Negative for cough, shortness of breath and wheezing.   Cardiovascular: Negative for chest pain, palpitations and leg swelling.  Gastrointestinal: Negative for nausea, abdominal pain, diarrhea, constipation and blood in stool.       No gerd  Genitourinary: Negative for dysuria and hematuria.  Neurological: Negative for dizziness, light-headedness and headaches.       Objective:   Filed Vitals:   06/13/16 1520  BP: 136/74  Pulse: 69  Temp: 98.1 F (36.7 C)  Resp: 16   Filed Weights   06/13/16 1520  Weight: 166 lb (75.297 kg)   Body mass index is 25.99 kg/(m^2).   Physical Exam  Constitutional: He appears well-developed and well-nourished. No distress.  Abdominal: Soft. He exhibits distension (mild in LUQ only, non-tender, ? splenomegaly). There is no  tenderness. There is no rebound and no guarding.  Musculoskeletal: He exhibits no edema.  Skin: Rash (papules randomly on arms, legs back and abdomen) noted. He is not diaphoretic.        Assessment & Plan:  See Problem List for Assessment and Plan of chronic medical problems.

## 2016-06-13 NOTE — Assessment & Plan Note (Signed)
?   Allergic, infectious, paraneoplastic Biopsy done by derm - ? Results Advised him to f/u with derm Check labs Given LUQ swelling will check [place] Vistaril at night  Can consider a steroid taper to see if that helps

## 2016-06-13 NOTE — Progress Notes (Signed)
Pre visit review using our clinic review tool, if applicable. No additional management support is needed unless otherwise documented below in the visit note. 

## 2016-06-13 NOTE — Assessment & Plan Note (Signed)
Mild swelling with standing, ? Splenomegaly No pain or other concerning symptoms Will check [place] Ct of AB/P normal in 1/17 Check cmp

## 2016-06-13 NOTE — Patient Instructions (Signed)
  Test(s) ordered today. Your results will be released to [facility] (or called to you) after review, usually within 72hours after test completion. If any changes need to be made, you will be notified at that same time.   Medications reviewed and updated.  Changes include taking hydroxyzine at night for the itch.   Your prescription(s) have been submitted to your pharmacy. Please take as directed and contact our office if you believe you are having problem(s) with the medication(s).  An Ultrasound of your abdomen was ordered.   Please followup with dermatology.

## 2016-06-14 ENCOUNTER — Other Ambulatory Visit: Payer: Self-pay | Admitting: Cardiology

## 2016-06-14 ENCOUNTER — Other Ambulatory Visit: Payer: Self-pay

## 2016-06-14 MED ORDER — CLOPIDOGREL BISULFATE 75 MG PO TABS: 75.0000 mg | ORAL_TABLET | Freq: Every day | ORAL | Status: DC

## 2016-06-17 ENCOUNTER — Ambulatory Visit
Admission: RE | Admit: 2016-06-17 | Discharge: 2016-06-17 | Disposition: A | Discharge status: Home / Self Care | Payer: Medicare Other | Source: Ambulatory Visit | Attending: Internal Medicine | Admitting: Internal Medicine

## 2016-06-17 DIAGNOSIS — R1012 Left upper quadrant pain: Secondary | ICD-10-CM | POA: Diagnosis not present

## 2016-06-17 DIAGNOSIS — R198 Other specified symptoms and signs involving the digestive system and abdomen: Secondary | ICD-10-CM

## 2016-06-17 DIAGNOSIS — R1909 Other intra-abdominal and pelvic swelling, mass and lump: Secondary | ICD-10-CM | POA: Diagnosis not present

## 2016-06-18 ENCOUNTER — Encounter: Payer: Self-pay | Admitting: Internal Medicine

## 2016-06-18 DIAGNOSIS — K760 Fatty (change of) liver, not elsewhere classified: Secondary | ICD-10-CM | POA: Insufficient documentation

## 2016-06-21 ENCOUNTER — Other Ambulatory Visit: Payer: Self-pay | Admitting: Internal Medicine

## 2016-06-21 MED ORDER — PREDNISONE 10 MG PO TABS: ORAL_TABLET | ORAL | Status: DC

## 2016-06-23 NOTE — Telephone Encounter (Signed)
He has made a follow up. July 5 He isnt sure if the mediation is helping yet. He say its only been a day.

## 2016-06-29 DIAGNOSIS — L309 Dermatitis, unspecified: Secondary | ICD-10-CM | POA: Diagnosis not present

## 2016-07-20 DIAGNOSIS — L309 Dermatitis, unspecified: Secondary | ICD-10-CM | POA: Diagnosis not present

## 2016-07-20 DIAGNOSIS — L570 Actinic keratosis: Secondary | ICD-10-CM | POA: Diagnosis not present

## 2016-08-06 ENCOUNTER — Encounter (HOSPITAL_COMMUNITY): Payer: Self-pay

## 2016-08-06 ENCOUNTER — Emergency Department (HOSPITAL_COMMUNITY)
Admission: EM | Admit: 2016-08-06 | Discharge: 2016-08-06 | Disposition: A | Discharge status: Home / Self Care | Payer: Medicare Other | Attending: Emergency Medicine | Admitting: Emergency Medicine

## 2016-08-06 DIAGNOSIS — Z79899 Other long term (current) drug therapy: Secondary | ICD-10-CM | POA: Diagnosis not present

## 2016-08-06 DIAGNOSIS — Z7982 Long term (current) use of aspirin: Secondary | ICD-10-CM | POA: Diagnosis not present

## 2016-08-06 DIAGNOSIS — Z87891 Personal history of nicotine dependence: Secondary | ICD-10-CM | POA: Diagnosis not present

## 2016-08-06 DIAGNOSIS — R61 Generalized hyperhidrosis: Secondary | ICD-10-CM | POA: Diagnosis not present

## 2016-08-06 DIAGNOSIS — Z8551 Personal history of malignant neoplasm of bladder: Secondary | ICD-10-CM | POA: Diagnosis not present

## 2016-08-06 DIAGNOSIS — Z85828 Personal history of other malignant neoplasm of skin: Secondary | ICD-10-CM | POA: Diagnosis not present

## 2016-08-06 DIAGNOSIS — I10 Essential (primary) hypertension: Secondary | ICD-10-CM | POA: Diagnosis not present

## 2016-08-06 DIAGNOSIS — R110 Nausea: Secondary | ICD-10-CM | POA: Insufficient documentation

## 2016-08-06 DIAGNOSIS — Z85528 Personal history of other malignant neoplasm of kidney: Secondary | ICD-10-CM | POA: Diagnosis not present

## 2016-08-06 DIAGNOSIS — J449 Chronic obstructive pulmonary disease, unspecified: Secondary | ICD-10-CM | POA: Diagnosis not present

## 2016-08-06 DIAGNOSIS — R42 Dizziness and giddiness: Secondary | ICD-10-CM | POA: Diagnosis not present

## 2016-08-06 DIAGNOSIS — I252 Old myocardial infarction: Secondary | ICD-10-CM | POA: Diagnosis not present

## 2016-08-06 DIAGNOSIS — R531 Weakness: Secondary | ICD-10-CM | POA: Diagnosis not present

## 2016-08-06 DIAGNOSIS — Z91010 Allergy to peanuts: Secondary | ICD-10-CM | POA: Diagnosis not present

## 2016-08-06 DIAGNOSIS — I2510 Atherosclerotic heart disease of native coronary artery without angina pectoris: Secondary | ICD-10-CM | POA: Insufficient documentation

## 2016-08-06 DIAGNOSIS — R5383 Other fatigue: Secondary | ICD-10-CM | POA: Diagnosis present

## 2016-08-06 LAB — I-STAT TROPONIN, ED: TROPONIN I, POC: 0 ng/mL (ref 0.00–0.08)

## 2016-08-06 LAB — CBC
HEMATOCRIT: 38.2 % — AB (ref 39.0–52.0)
HEMOGLOBIN: 12.9 g/dL — AB (ref 13.0–17.0)
MCH: 30.6 pg (ref 26.0–34.0)
MCHC: 33.8 g/dL (ref 30.0–36.0)
MCV: 90.7 fL (ref 78.0–100.0)
Platelets: 157 10*3/uL (ref 150–400)
RBC: 4.21 MIL/uL — AB (ref 4.22–5.81)
RDW: 12.9 % (ref 11.5–15.5)
WBC: 7.8 10*3/uL (ref 4.0–10.5)

## 2016-08-06 LAB — URINALYSIS, ROUTINE W REFLEX MICROSCOPIC
Bilirubin Urine: NEGATIVE
GLUCOSE, UA: NEGATIVE mg/dL
Hgb urine dipstick: NEGATIVE
Ketones, ur: NEGATIVE mg/dL
LEUKOCYTES UA: NEGATIVE
NITRITE: NEGATIVE
PH: 7 (ref 5.0–8.0)
Protein, ur: NEGATIVE mg/dL
SPECIFIC GRAVITY, URINE: 1.018 (ref 1.005–1.030)

## 2016-08-06 LAB — BASIC METABOLIC PANEL
ANION GAP: 11 (ref 5–15)
BUN: 29 mg/dL — ABNORMAL HIGH (ref 6–20)
CALCIUM: 8.8 mg/dL — AB (ref 8.9–10.3)
CO2: 22 mmol/L (ref 22–32)
Chloride: 101 mmol/L (ref 101–111)
Creatinine, Ser: 1.63 mg/dL — ABNORMAL HIGH (ref 0.61–1.24)
GFR, EST AFRICAN AMERICAN: 41 mL/min — AB (ref >60)
GFR, EST NON AFRICAN AMERICAN: 36 mL/min — AB (ref >60)
Glucose, Bld: 131 mg/dL — ABNORMAL HIGH (ref 65–99)
POTASSIUM: 4.4 mmol/L (ref 3.5–5.1)
Sodium: 134 mmol/L — ABNORMAL LOW (ref 135–145)

## 2016-08-06 LAB — HEPATIC FUNCTION PANEL
ALBUMIN: 3.8 g/dL (ref 3.5–5.0)
ALT: 23 U/L (ref 17–63)
AST: 23 U/L (ref 15–41)
Alkaline Phosphatase: 67 U/L (ref 38–126)
BILIRUBIN TOTAL: 0.9 mg/dL (ref 0.3–1.2)
Bilirubin, Direct: 0.3 mg/dL (ref 0.1–0.5)
Indirect Bilirubin: 0.6 mg/dL (ref 0.3–0.9)
TOTAL PROTEIN: 6.1 g/dL — AB (ref 6.5–8.1)

## 2016-08-06 LAB — I-STAT CHEM 8, ED
BUN: 31 mg/dL — ABNORMAL HIGH (ref 6–20)
CHLORIDE: 102 mmol/L (ref 101–111)
Calcium, Ion: 1.04 mmol/L — ABNORMAL LOW (ref 1.12–1.23)
Creatinine, Ser: 1.5 mg/dL — ABNORMAL HIGH (ref 0.61–1.24)
GLUCOSE: 134 mg/dL — AB (ref 65–99)
HEMATOCRIT: 38 % — AB (ref 39.0–52.0)
HEMOGLOBIN: 12.9 g/dL — AB (ref 13.0–17.0)
POTASSIUM: 4.5 mmol/L (ref 3.5–5.1)
SODIUM: 136 mmol/L (ref 135–145)
TCO2: 23 mmol/L (ref 0–100)

## 2016-08-06 LAB — CBG MONITORING, ED: Glucose-Capillary: 130 mg/dL — ABNORMAL HIGH (ref 65–99)

## 2016-08-06 LAB — POC OCCULT BLOOD, ED: FECAL OCCULT BLD: NEGATIVE

## 2016-08-06 LAB — I-STAT CG4 LACTIC ACID, ED: LACTIC ACID, VENOUS: 1.97 mmol/L — AB (ref 0.5–1.9)

## 2016-08-06 MED ORDER — SODIUM CHLORIDE 0.9 % IV BOLUS (SEPSIS)
500.0000 mL | Freq: Once | INTRAVENOUS | Status: AC
  Administered 2016-08-06: 500 mL via INTRAVENOUS

## 2016-08-06 MED ORDER — ONDANSETRON 4 MG PO TBDP: 4.0000 mg | ORAL_TABLET | Freq: Three times a day (TID) | ORAL | 0 refills | Status: DC | PRN

## 2016-08-06 MED ORDER — ONDANSETRON HCL 4 MG/2ML IJ SOLN
4.0000 mg | Freq: Once | INTRAMUSCULAR | Status: AC
  Administered 2016-08-06: 4 mg via INTRAVENOUS
  Filled 2016-08-06: qty 2

## 2016-08-06 NOTE — ED Provider Notes (Signed)
[facility] DEPT Provider Note   CSN: [id number] Arrival date & time: 08/06/16  1444  First Provider Contact:  None       History   Chief Complaint Chief Complaint  Patient presents with  . Excessive Sweating  . Fatigue    HPI Collin [name] is a 82 y.o. male.  The history is provided by the patient. No language interpreter was used.  Weakness  The current episode started 3 to 5 hours ago. The problem occurs constantly. The problem has been gradually improving. Pertinent negatives include no chest pain, no abdominal pain and no shortness of breath. Exacerbated by: standing. Relieved by: lying. He has tried rest for the symptoms. The treatment provided mild relief.    Past Medical History:  Diagnosis Date  . Allergy   . Anemia   . Arthritis    gout  . Bladder cancer ([place])   . BPH (benign prostatic hyperplasia)   . CAD (coronary artery disease) 2005   DES January 2 005, residual disease  / Myoview  . Cancer ([place]) 02/28/2001   Left ureter cancer/bladder  . Carcinoma, renal cell ([place])   . Carotid artery disease ([place])    Doppler, September, 2010, normal, no carotid artery disease  . COPD (chronic obstructive pulmonary disease) ([place])   . Dysfunction of eustachian tube   . Ejection fraction    EF 55%, echo, January, 2009  . Encounter for long-term (current) use of other medications   . Enlarged prostate   . GERD (gastroesophageal reflux disease)   . Gynecomastia    b/l  . HTN (hypertension)   . Hx of cystoscopy 11/02/12   negative  . Hx of skin cancer, basal cell   . Hypercholesterolemia   . Hyperlipidemia   . Low back pain syndrome   . Myocardial infarction ([place])    hx acute  . Nocturia   . Organic impotence   . Pulmonary nodule    Chest CT, followed  . Reactive airway disease   . 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 Staph infection 04/1997  . Thrombocytopenia [facility])     Patient Active Problem List   Diagnosis Date Noted  . Fatty  liver 06/18/2016  . Rash and nonspecific skin eruption 06/13/2016  . LUQ fullness 06/13/2016  . Hypertriglyceridemia 04/14/2016  . H/O bacterial endocarditis 04/06/2016  . Myalgia and myositis 12/18/2015  . Abnormal CT of the abdomen 12/31/2013  . Abnormal chest x-ray 12/31/2013  . Gynecomastia   . Spinal stenosis, lumbar 05/12/2012  . CAD (coronary artery disease)   . Ejection fraction   . Carotid artery disease ([place])   . CERVICAL RADICULOPATHY, LEFT 12/07/2009  . SKIN CANCER, HX OF 12/07/2009  . Renal cell carcinoma ([place]) 07/18/2009  . ANEMIA, 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  . COPD 07/18/2009  . GERD 07/18/2009  . LOW BACK PAIN SYNDROME 07/07/2009  . DYSFUNCTION OF EUSTACHIAN TUBE 04/23/2008  . Essential hypertension 04/23/2008  . REACTIVE AIRWAY DISEASE 03/28/2008  . Hyperlipidemia 01/25/2007  . Cancer ([place]) 02/28/2001    Past Surgical History:  Procedure Laterality Date  . BACK SURGERY  02/1996  . bladder tumors  2003, 2004, 01/2009   removed  . CARDIAC CATHETERIZATION    . CHOLECYSTECTOMY  07/2003  . 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 NEPHRECTOMY  02/2001   left kidney, ureter  . osteomyelitis     staph aureus  .  URETERECTOMY  2003   for cancer       Home Medications    Prior to Admission medications   Medication Sig Start Date End Date Taking? Authorizing Provider  alfuzosin (UROXATRAL) 10 MG 24 hr tablet Take 10 mg by mouth daily.  01/19/12   Historical Provider, MD  amoxicillin (AMOXIL) 500 MG capsule TAKE 4 CAPSULES BY MOUTH 1 HOUR BEFORE DENTAL APPOINTMENT 05/18/16   Binnie Rail, MD  aspirin 81 MG tablet Take 81 mg by mouth daily.      Historical Provider, MD  clopidogrel (PLAVIX) 75 MG tablet Take 1 tablet (75 mg total) by mouth daily. 06/14/16   Thayer Headings, MD  finasteride (PROSCAR) 5 MG tablet Take 5 mg by mouth daily.    Historical Provider, MD  hydrOXYzine (ATARAX/VISTARIL) 25 MG tablet Take 1 tablet (25  mg total) by mouth at bedtime as needed. 06/13/16   Binnie Rail, MD  Multiple Vitamin (MULTIVITAMIN) tablet Take 1 tablet by mouth daily.      Historical Provider, MD  pantoprazole (PROTONIX) 40 MG tablet Take 1 tablet (40 mg total) by mouth daily. Take 30 minutes prior to breakfast. 01/07/16   Amy S Esterwood, PA-C  predniSONE (DELTASONE) 10 MG tablet Take 4 tabs po qd x 3 days, then 3 tabs po qd x 3 days, then 2 tabs po qd x 3 days, then 1 tab po qd x 3 days 06/21/16   Binnie Rail, MD  Probiotic Product (ALIGN) 4 MG CAPS Taking daily 04/06/16   Binnie Rail, MD  ranitidine (ZANTAC) 150 MG tablet Take 150 mg by mouth at bedtime.    Hendricks Limes, MD  valsartan (DIOVAN) 160 MG tablet Take 1 tablet (160 mg total) by mouth daily. 04/04/16   Thayer Headings, MD    Family History Family History  Problem Relation Age of Onset  . Heart failure Mother 67  . Diabetes Mother   . Coronary artery disease Brother   . Diabetes Brother   . Drug abuse Brother   . Diabetes Sister   . Diabetes Sister     Social History Social History  Substance Use Topics  . Smoking status: Former Smoker    Types: Pipe    Quit date: 12/27/1991  . Smokeless tobacco: Never Used     Comment: Quit 20-30 years ago as of 2012  . Alcohol use No     Allergies   Atorvastatin; Peanut oil; and Trandolapril   Review of Systems Review of Systems  Constitutional: Positive for chills and diaphoresis. Negative for fever.  HENT: Negative.   Eyes: Negative.   Respiratory: Negative for shortness of breath.   Cardiovascular: Negative for chest pain.  Gastrointestinal: Positive for nausea. Negative for abdominal pain, diarrhea and vomiting.  Genitourinary: Negative.   Musculoskeletal: Negative.   Skin: Negative.   Neurological: Positive for weakness and light-headedness. Negative for syncope.  Psychiatric/Behavioral: Negative.      Physical Exam Updated Vital Signs BP 110/71 (BP Location: Right Arm)   Pulse 60    Temp 97.8 F (36.6 C) (Oral)   Resp 18   Ht 5\' 7"  (1.702 m)   Wt 77.1 kg   SpO2 100%   BMI 26.63 kg/m   Physical Exam  Constitutional: He is oriented to person, place, and time. He appears well-developed and well-nourished. No distress.  HENT:  Head: Normocephalic and atraumatic.  Eyes: Conjunctivae are normal.  Neck: Normal range of motion. Neck supple. No  tracheal deviation present.  Cardiovascular: Normal rate, regular rhythm, normal heart sounds and intact distal pulses.   No murmur heard. Pulmonary/Chest: Effort normal and breath sounds normal. No stridor. No respiratory distress. He has no wheezes. He has no rales.  Abdominal: Soft. He exhibits no distension. There is no tenderness. There is no rebound and no guarding.  Musculoskeletal: He exhibits no edema.  Neurological: He is alert and oriented to person, place, and time. He is not disoriented. GCS eye subscore is 4. GCS verbal subscore is 5. GCS motor subscore is 6.  Skin: Skin is warm and dry. Capillary refill takes less than 2 seconds. He is not diaphoretic. No pallor.  Psychiatric: He has a normal mood and affect. His behavior is normal. Judgment and thought content normal.  Nursing note and vitals reviewed.    ED Treatments / Results  Labs (all labs ordered are listed, but only abnormal results are displayed) Labs Reviewed  BASIC METABOLIC PANEL - Abnormal; Notable for the following:       Result Value   Sodium 134 (*)    Glucose, Bld 131 (*)    BUN 29 (*)    Creatinine, Ser 1.63 (*)    Calcium 8.8 (*)    GFR calc non Af Amer 36 (*)    GFR calc Af Amer 41 (*)    All other components within normal limits  CBC - Abnormal; Notable for the following:    RBC 4.21 (*)    Hemoglobin 12.9 (*)    HCT 38.2 (*)    All other components within normal limits  HEPATIC FUNCTION PANEL - Abnormal; Notable for the following:    Total Protein 6.1 (*)    All other components within normal limits  CBG MONITORING, ED -  Abnormal; Notable for the following:    Glucose-Capillary 130 (*)    All other components within normal limits  I-STAT CHEM 8, ED - Abnormal; Notable for the following:    BUN 31 (*)    Creatinine, Ser 1.50 (*)    Glucose, Bld 134 (*)    Calcium, Ion 1.04 (*)    Hemoglobin 12.9 (*)    HCT 38.0 (*)    All other components within normal limits  I-STAT CG4 LACTIC ACID, ED - Abnormal; Notable for the following:    Lactic Acid, Venous 1.97 (*)    All other components within normal limits  URINALYSIS, ROUTINE W REFLEX MICROSCOPIC (NOT AT [facility])  I-STAT TROPOININ, ED  POC OCCULT BLOOD, ED    EKG  EKG Interpretation  Date/Time:  Saturday August 06 2016 14:50:57 EDT Ventricular Rate:  63 PR Interval:  138 QRS Duration: 82 QT Interval:  408 QTC Calculation: 417 R Axis:   80 Text Interpretation:  Normal sinus rhythm Nonspecific T wave abnormality No significant change since last tracing Confirmed by Ashok Cordia  MD, Lennette Bihari ([id number]) on 08/06/2016 3:19:17 PM       Radiology No results found.  Procedures Procedures (including critical care time)  Medications Ordered in ED Medications  ondansetron (ZOFRAN) injection 4 mg (4 mg Intravenous Given 08/06/16 1533)  sodium chloride 0.9 % bolus 500 mL (0 mLs Intravenous Stopped 08/06/16 1748)     Initial Impression / Assessment and Plan / ED Course  I have reviewed the triage vital signs and the nursing notes.  Pertinent labs & imaging results that were available during my care of the patient were reviewed by me and considered in my medical decision making (see chart  for details).  Clinical Course   82 year old male with a past medical history of MI status post DES, cholecystectomy, nephrectomy presents to emergency department for acute onset nausea, diaphoresis and chills.  States that this started after eating lunch today.  Attempted to wait at home however was worried when his blood pressure is running low for him.  Had one episode of  hypotension at home.  Was also orthostatic.  Denies any syncope.  We will send blood work.  EKG without signs of acute ischemia however does have peaked T waves. No abdominal pain.  Denies shortness of breath or chest pain. Bloodwork grossly WNL. UA WNL. FOBT neg. Mild lactic acidosis, so was given fluids. Patient had symptoms start after lunch. Mostly vegetables. But due to patient feeling better with minimal interventions and time frame, Sx likely 2/2 food borne illness. No recent sick contacts, making GI infection less likely. Instructed patient to follow up with PCP for recheck. Usual and customary return precautions for Nausea/vomiting given. All questions answered. Patient states understanding and is in agreement with plan. Pt and VS stable at discharge.  Final Clinical Impressions(s) / ED Diagnoses   Final diagnoses:  Nausea    New Prescriptions Discharge Medication List as of 08/06/2016  8:08 PM    START taking these medications   Details  ondansetron (ZOFRAN ODT) 4 MG disintegrating tablet Take 1 tablet (4 mg total) by mouth every 8 (eight) hours as needed for nausea or vomiting., Starting Sat 08/06/2016, Print         Darlina Rumpf, MD 08/08/16 [place], MD 08/11/16 1425

## 2016-08-06 NOTE — ED Notes (Signed)
Pt attempted to use urinal and accidentally spilled urine onto bed. Bed cleaned as noted.

## 2016-08-06 NOTE — ED Notes (Signed)
Gave pt crackers and sprite for PO challenge, will reevaluate after pt tries to eat/drink.

## 2016-08-06 NOTE — ED Notes (Signed)
Pt given urinal.

## 2016-08-06 NOTE — ED Notes (Signed)
Attempted X 1 to insert 20 gauge catheter for Iv Access into pt.s lt. Forearm.., unsuccessful, dressing applied no bleeding to swelling or bruising noted.

## 2016-08-06 NOTE — ED Triage Notes (Signed)
Patient here with episode of diaphoresis with nausea and near syncope 1 hour pta. Patient states that his BP was 80 at home, denies pain, denies shortness of breath. Pale on arrival.

## 2016-08-17 ENCOUNTER — Telehealth: Payer: Self-pay | Admitting: Internal Medicine

## 2016-08-17 NOTE — Telephone Encounter (Signed)
Patient states he is having teeth pulled on Monday the 28th.  He is requesting antibiotic to be sent to [facility] on [address].  Please follow up with patient in regard.

## 2016-08-18 MED ORDER — AMOXICILLIN 500 MG PO CAPS: ORAL_CAPSULE | ORAL | 0 refills | Status: DC

## 2016-08-18 NOTE — Telephone Encounter (Signed)
Spoke with pt to inform.  

## 2016-08-18 NOTE — Telephone Encounter (Signed)
PLease advise

## 2016-08-18 NOTE — Telephone Encounter (Signed)
Sent to [facility] 

## 2016-09-06 DIAGNOSIS — L309 Dermatitis, unspecified: Secondary | ICD-10-CM | POA: Diagnosis not present

## 2016-09-14 DIAGNOSIS — Z947 Corneal transplant status: Secondary | ICD-10-CM | POA: Diagnosis not present

## 2016-09-26 DIAGNOSIS — L309 Dermatitis, unspecified: Secondary | ICD-10-CM | POA: Diagnosis not present

## 2016-10-07 ENCOUNTER — Encounter: Payer: Self-pay | Admitting: Internal Medicine

## 2016-10-07 ENCOUNTER — Ambulatory Visit (INDEPENDENT_AMBULATORY_CARE_PROVIDER_SITE_OTHER): Payer: Medicare Other | Admitting: Internal Medicine

## 2016-10-07 VITALS — BP 110/56 | HR 81 | Temp 97.9°F | Resp 16 | Ht 67.0 in | Wt 170.0 lb

## 2016-10-07 DIAGNOSIS — Z23 Encounter for immunization: Secondary | ICD-10-CM

## 2016-10-07 DIAGNOSIS — K219 Gastro-esophageal reflux disease without esophagitis: Secondary | ICD-10-CM | POA: Diagnosis not present

## 2016-10-07 DIAGNOSIS — I10 Essential (primary) hypertension: Secondary | ICD-10-CM

## 2016-10-07 DIAGNOSIS — I2510 Atherosclerotic heart disease of native coronary artery without angina pectoris: Secondary | ICD-10-CM

## 2016-10-07 DIAGNOSIS — R21 Rash and other nonspecific skin eruption: Secondary | ICD-10-CM | POA: Diagnosis not present

## 2016-10-07 MED ORDER — PREDNISONE 5 MG PO TABS: 5.0000 mg | ORAL_TABLET | Freq: Every day | ORAL | 0 refills | Status: AC

## 2016-10-07 NOTE — Progress Notes (Signed)
Subjective:    Patient ID: Collin [name], male    DOB: [birth year], 82 y.o.   MRN: [id number]  HPI The patient is here for follow up.  Hypertension: He is taking his medication daily. He is compliant with a low sodium diet.  He denies chest pain, palpitations, edema, shortness of breath and regular headaches. He is exercising regularly.  He does not monitor his blood pressure at home.    Rash:  Started several Months ago. No obvious cause. He has seen dermatology. Symptoms relieved with topical hand or oral steroids. He will begin on oral steroids. Continue topical steroids if needed, but discussed possible long-term effects.  GERD:  He is taking his medication daily as prescribed.  He denies any GERD symptoms and feels his GERD is well controlled.     Medications and allergies reviewed with patient and updated if appropriate.  Patient Active Problem List   Diagnosis Date Noted  . Fatty liver 06/18/2016  . Rash and nonspecific skin eruption 06/13/2016  . LUQ fullness 06/13/2016  . Hypertriglyceridemia 04/14/2016  . H/O bacterial endocarditis 04/06/2016  . Myalgia and myositis 12/18/2015  . Abnormal CT of the abdomen 12/31/2013  . Abnormal chest x-ray 12/31/2013  . Gynecomastia   . Spinal stenosis, lumbar 05/12/2012  . CAD (coronary artery disease)   . Ejection fraction   . Carotid artery disease ([place])   . CERVICAL RADICULOPATHY, LEFT 12/07/2009  . SKIN CANCER, HX OF 12/07/2009  . Renal cell carcinoma ([place]) 07/18/2009  . ANEMIA, 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  . COPD 07/18/2009  . GERD 07/18/2009  . LOW BACK PAIN SYNDROME 07/07/2009  . DYSFUNCTION OF EUSTACHIAN TUBE 04/23/2008  . Essential hypertension 04/23/2008  . REACTIVE AIRWAY DISEASE 03/28/2008  . Hyperlipidemia 01/25/2007  . Cancer ([place]) 02/28/2001    Current Outpatient Prescriptions on File Prior to Visit  Medication Sig Dispense Refill  . alfuzosin (UROXATRAL) 10 MG 24 hr tablet Take 10 mg by mouth daily.     Marland Kitchen  amoxicillin (AMOXIL) 500 MG capsule TAKE 4 CAPSULES BY MOUTH 1 HOUR BEFORE DENTAL APPOINTMENT 4 capsule 0  . aspirin 81 MG tablet Take 81 mg by mouth daily.      . clopidogrel (PLAVIX) 75 MG tablet Take 1 tablet (75 mg total) by mouth daily. 90 tablet 2  . finasteride (PROSCAR) 5 MG tablet Take 5 mg by mouth daily.    . fluticasone (FLONASE) 50 MCG/ACT nasal spray Place 1 spray into both nostrils daily as needed for rhinitis.    . hydrOXYzine (ATARAX/VISTARIL) 25 MG tablet Take 1 tablet (25 mg total) by mouth at bedtime as needed. 30 tablet 0  . Multiple Vitamin (MULTIVITAMIN) tablet Take 1 tablet by mouth daily.      . ondansetron (ZOFRAN ODT) 4 MG disintegrating tablet Take 1 tablet (4 mg total) by mouth every 8 (eight) hours as needed for nausea or vomiting. 6 tablet 0  . pantoprazole (PROTONIX) 40 MG tablet Take 1 tablet (40 mg total) by mouth daily. Take 30 minutes prior to breakfast. 90 tablet 3  . predniSONE (DELTASONE) 10 MG tablet Take 4 tabs po qd x 3 days, then 3 tabs po qd x 3 days, then 2 tabs po qd x 3 days, then 1 tab po qd x 3 days 30 tablet 0  . Probiotic Product (ALIGN) 4 MG CAPS Taking daily    . ranitidine (ZANTAC) 150 MG tablet Take 150 mg by mouth at bedtime.    . valsartan (DIOVAN)  160 MG tablet Take 1 tablet (160 mg total) by mouth daily. 90 tablet 3   No current facility-administered medications on file prior to visit.     Past Medical History:  Diagnosis Date  . Allergy   . Anemia   . Arthritis    gout  . Bladder cancer ([place])   . BPH (benign prostatic hyperplasia)   . CAD (coronary artery disease) 2005   DES January 2 005, residual disease  / Myoview  . Cancer ([place]) 02/28/2001   Left ureter cancer/bladder  . Carcinoma, renal cell ([place])   . Carotid artery disease ([place])    Doppler, September, 2010, normal, no carotid artery disease  . COPD (chronic obstructive pulmonary disease) ([place])   . Dysfunction of eustachian tube   . Ejection fraction    EF 55%, echo,  January, 2009  . Encounter for long-term (current) use of other medications   . Enlarged prostate   . GERD (gastroesophageal reflux disease)   . Gynecomastia    b/l  . HTN (hypertension)   . Hx of cystoscopy 11/02/12   negative  . Hx of skin cancer, basal cell   . Hypercholesterolemia   . Hyperlipidemia   . Low back pain syndrome   . Myocardial infarction    hx acute  . Nocturia   . Organic impotence   . Pulmonary nodule    Chest CT, followed  . Reactive airway disease   . 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 Staph infection 04/1997  . Thrombocytopenia ([place])     Past Surgical History:  Procedure Laterality Date  . BACK SURGERY  02/1996  . bladder tumors  2003, 2004, 01/2009   removed  . CARDIAC CATHETERIZATION    . CHOLECYSTECTOMY  07/2003  . 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 NEPHRECTOMY  02/2001   left kidney, ureter  . osteomyelitis     staph aureus  . URETERECTOMY  2003   for cancer    Social History   Social History  . Marital status: Married    Spouse name: N/A  . Number of children: N/A  . Years of education: N/A   Occupational History  . 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 Smoking status: Former Smoker    Types: Pipe    Quit date: 12/27/1991  . Smokeless tobacco: Never Used     Comment: Quit 20-30 years ago as of 2012  . Alcohol use No  . Drug use: No  . Sexual activity: Not on file   Other Topics Concern  . Not on file   Social History Narrative   30 minutes a day for 5 days a week    Family History  Problem Relation Age of Onset  . Heart failure Mother 82  . Diabetes Mother   . Coronary artery disease Brother   . Diabetes Brother   . Drug abuse Brother   . Diabetes Sister   . Diabetes Sister     Review of Systems  Constitutional: Negative for chills and fever.  Respiratory: Positive for shortness of breath (chronic, mild, no  change). Negative for cough and wheezing.   Cardiovascular: Negative for chest pain, palpitations and leg swelling.  Gastrointestinal: Negative for abdominal pain.       Gerd controlled  Neurological: Positive for headaches. Negative for light-headedness.  Objective:   Vitals:   10/07/16 1335  BP: (!) 110/56  Pulse: 81  Resp: 16  Temp: 97.9 F (36.6 C)   Filed Weights   10/07/16 1335  Weight: 170 lb (77.1 kg)   Body mass index is 26.63 kg/m.   Physical Exam    Constitutional: Appears well-developed and well-nourished. No distress.  HENT:  Head: Normocephalic and atraumatic.  Neck: Neck supple. No tracheal deviation present. No thyromegaly present.  No cervical lymphadenopathy Cardiovascular: Normal rate, regular rhythm and normal heart sounds.   No murmur heard. No carotid bruit .  No edema Pulmonary/Chest: Effort normal and breath sounds normal. No respiratory distress. No has no wheezes. No rales.  Abdomen: soft, nontender Skin: Skin is warm and dry. Not diaphoretic. No rash Psychiatric: Normal mood and affect. Behavior is normal.      Assessment & Plan:    See Problem List for Assessment and Plan of chronic medical problems.

## 2016-10-07 NOTE — Assessment & Plan Note (Signed)
GERD controlled Continue daily medication Try holding protonix for brief period of time to see if this is the cause of the itching - started around the time the itch started

## 2016-10-07 NOTE — Patient Instructions (Addendum)
Try holding the protonix to see if the itching improves.   All other Health Maintenance issues reviewed.   All recommended immunizations and age-appropriate screenings are up-to-date or discussed.  Flu vaccine administered today.   Medications reviewed and updated.  No changes recommended at this time, except temporatily holding the protonix.     Please followup in 6 months

## 2016-10-07 NOTE — Assessment & Plan Note (Addendum)
Itching legs, arms, back Following with derm Non-specific dermatitis Treated with topical steroids, intermittent oral steroids ? Cause - try hold protonix  May need to hold each medication termporarily

## 2016-10-07 NOTE — Assessment & Plan Note (Signed)
BP well controlled Current regimen effective and well tolerated Continue current medications at current doses  

## 2016-10-07 NOTE — Progress Notes (Signed)
Pre visit review using our clinic review tool, if applicable. No additional management support is needed unless otherwise documented below in the visit note. 

## 2016-11-07 ENCOUNTER — Ambulatory Visit (INDEPENDENT_AMBULATORY_CARE_PROVIDER_SITE_OTHER): Payer: Medicare Other

## 2016-11-07 DIAGNOSIS — Z23 Encounter for immunization: Secondary | ICD-10-CM

## 2016-11-25 DIAGNOSIS — Z8551 Personal history of malignant neoplasm of bladder: Secondary | ICD-10-CM | POA: Diagnosis not present

## 2016-11-25 DIAGNOSIS — R350 Frequency of micturition: Secondary | ICD-10-CM | POA: Diagnosis not present

## 2016-11-25 DIAGNOSIS — N401 Enlarged prostate with lower urinary tract symptoms: Secondary | ICD-10-CM | POA: Diagnosis not present

## 2016-12-16 ENCOUNTER — Telehealth: Payer: Self-pay | Admitting: Cardiovascular Disease

## 2016-12-16 NOTE — Telephone Encounter (Signed)
New message    Pt verbalized that he is wants to speak to rn   Pt c/o medication issue:  1. Name of Medication: plavix  2. How are you currently taking this medication (dosage and times per day)? 75mg  1x day  3. Are you having a reaction (difficulty breathing--STAT)? no  4. What is your medication issue? Skin condition, round red spots itch

## 2016-12-16 NOTE — Telephone Encounter (Signed)
Spoke with patient who states he has had a terrible red rash for several months and nothing that the dermatologist has given him is helping. He states he has been reading and thinks the clopidogrel is causing the rash. He has been taking it for many years. He has a cardiac stent from 2005. He states he is due for a refill and would like to hold the medication if possible to see if rash goes away.  I advised that Dr. Acie Fredrickson is out of the office until next week and that I will review with Dr. Irish Lack who is DOD and call him back with his advice. Patient verbalized understanding and agreement.   I notified patient that per Dr. Irish Lack, he may hold plavix for 2 weeks to see if his symptoms resolve.  I advised patient to call back at the end of the 2 weeks to let us know how he is doing.  He verbalized understanding and agreement.

## 2016-12-20 NOTE — Telephone Encounter (Signed)
I agree with the plan as outlined - will hold Plavix and see if the rash resolves

## 2016-12-27 ENCOUNTER — Encounter: Payer: Self-pay | Admitting: Internal Medicine

## 2016-12-27 ENCOUNTER — Ambulatory Visit (INDEPENDENT_AMBULATORY_CARE_PROVIDER_SITE_OTHER): Payer: Medicare Other | Admitting: Internal Medicine

## 2016-12-27 VITALS — BP 116/74 | HR 81 | Temp 97.7°F | Resp 16 | Wt 172.0 lb

## 2016-12-27 DIAGNOSIS — R21 Rash and other nonspecific skin eruption: Secondary | ICD-10-CM | POA: Diagnosis not present

## 2016-12-27 MED ORDER — TRIAMCINOLONE ACETONIDE 0.5 % EX OINT: 1.0000 "application " | TOPICAL_OINTMENT | Freq: Two times a day (BID) | CUTANEOUS | 1 refills | Status: DC

## 2016-12-27 NOTE — Patient Instructions (Signed)
  Medications reviewed and updated.  Changes include trying a stronger triamcinolone ointment for the rash.  Your prescription(s) have been submitted to your pharmacy. Please take as directed and contact our office if you believe you are having problem(s) with the medication(s).  A referral was ordered for rheumatology.

## 2016-12-27 NOTE — Progress Notes (Signed)
Subjective:    Patient ID: Collin [name], male    DOB: 1925/01/22, 83 y.o.   MRN: [id number]  HPI He is here for an acute visit.   Rash:  He has a rash on his hands for the past three weeks.  He has had similar rashes in the past on his back , leg, chest.  The rash does itch.  He has seen Derm and nothing has helped.  He has been on oral steroids, topical steroids, anti-itch medications.  At one point the oral steroids worked.  Nothing seems to help topically.  He is bothered by the itch and it is spreading.  He did have a biopsy by Derm and he was told it looked like something from a bug big.  Biopsy report in chart - ? Arthropod mouth seen.  Dermatology was not able to provide an explanation for his rash, which is why he is here.  He was not sure if it was allergic in nature - related to something he was eating.    Medications and allergies reviewed with patient and updated if appropriate.  Patient Active Problem List   Diagnosis Date Noted  . Fatty liver 06/18/2016  . Rash and nonspecific skin eruption 06/13/2016  . LUQ fullness 06/13/2016  . Hypertriglyceridemia 04/14/2016  . H/O bacterial endocarditis 04/06/2016  . Myalgia and myositis 12/18/2015  . Abnormal CT of the abdomen 12/31/2013  . Abnormal chest x-ray 12/31/2013  . Gynecomastia   . Spinal stenosis, lumbar 05/12/2012  . CAD (coronary artery disease)   . Ejection fraction   . Carotid artery disease ([place])   . CERVICAL RADICULOPATHY, LEFT 12/07/2009  . SKIN CANCER, HX OF 12/07/2009  . Renal cell carcinoma ([place]) 07/18/2009  . ANEMIA, 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  . COPD 07/18/2009  . GERD 07/18/2009  . LOW BACK PAIN SYNDROME 07/07/2009  . DYSFUNCTION OF EUSTACHIAN TUBE 04/23/2008  . Essential hypertension 04/23/2008  . REACTIVE AIRWAY DISEASE 03/28/2008  . Hyperlipidemia 01/25/2007  . Cancer ([place]) 02/28/2001    Current Outpatient Prescriptions on File Prior to Visit  Medication Sig Dispense Refill  . alfuzosin  (UROXATRAL) 10 MG 24 hr tablet Take 10 mg by mouth daily.     Marland Kitchen amoxicillin (AMOXIL) 500 MG capsule TAKE 4 CAPSULES BY MOUTH 1 HOUR BEFORE DENTAL APPOINTMENT 4 capsule 0  . aspirin 81 MG tablet Take 81 mg by mouth daily.      . clopidogrel (PLAVIX) 75 MG tablet Take 1 tablet (75 mg total) by mouth daily. 90 tablet 2  . finasteride (PROSCAR) 5 MG tablet Take 5 mg by mouth daily.    . fluticasone (FLONASE) 50 MCG/ACT nasal spray Place 1 spray into both nostrils daily as needed for rhinitis.    . Multiple Vitamin (MULTIVITAMIN) tablet Take 1 tablet by mouth daily.      . Probiotic Product (ALIGN) 4 MG CAPS Taking daily    . ranitidine (ZANTAC) 150 MG tablet Take 150 mg by mouth at bedtime.    . triamcinolone cream (KENALOG) 0.1 %     . valsartan (DIOVAN) 160 MG tablet Take 1 tablet (160 mg total) by mouth daily. 90 tablet 3   No current facility-administered medications on file prior to visit.     Past Medical History:  Diagnosis Date  . Allergy   . Anemia   . Arthritis    gout  . Bladder cancer ([place])   . BPH (benign prostatic hyperplasia)   . CAD (coronary artery disease)  2005   DES January 2 005, residual disease  / Myoview  . Cancer ([place]) 02/28/2001   Left ureter cancer/bladder  . Carcinoma, renal cell ([place])   . Carotid artery disease ([place])    Doppler, September, 2010, normal, no carotid artery disease  . COPD (chronic obstructive pulmonary disease) ([place])   . Dysfunction of eustachian tube   . Ejection fraction    EF 55%, echo, January, 2009  . Encounter for long-term (current) use of other medications   . Enlarged prostate   . GERD (gastroesophageal reflux disease)   . Gynecomastia    b/l  . HTN (hypertension)   . Hx of cystoscopy 11/02/12   negative  . Hx of skin cancer, basal cell   . Hypercholesterolemia   . Hyperlipidemia   . Low back pain syndrome   . Myocardial infarction    hx acute  . Nocturia   . Organic impotence   . Pulmonary nodule    Chest CT, followed    . Reactive airway disease   . 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 Staph infection 04/1997  . Thrombocytopenia ([place])     Past Surgical History:  Procedure Laterality Date  . BACK SURGERY  02/1996  . bladder tumors  2003, 2004, 01/2009   removed  . CARDIAC CATHETERIZATION    . CHOLECYSTECTOMY  07/2003  . 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 NEPHRECTOMY  02/2001   left kidney, ureter  . osteomyelitis     staph aureus  . URETERECTOMY  2003   for cancer    Social History   Social History  . Marital status: Married    Spouse name: N/A  . Number of children: N/A  . Years of education: N/A   Occupational History  . 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 Smoking status: Former Smoker    Types: Pipe    Quit date: 12/27/1991  . Smokeless tobacco: Never Used     Comment: Quit 20-30 years ago as of 2012  . Alcohol use No  . Drug use: No  . Sexual activity: Not on file   Other Topics Concern  . Not on file   Social History Narrative   30 minutes a day for 5 days a week    Family History  Problem Relation Age of Onset  . Heart failure Mother 77  . Diabetes Mother   . Coronary artery disease Brother   . Diabetes Brother   . Drug abuse Brother   . Diabetes Sister   . Diabetes Sister     Review of Systems  Constitutional: Negative for fever.  Endocrine: Positive for cold intolerance.  Musculoskeletal: Negative for arthralgias.  Skin: Positive for rash (itchy, scaly).  Neurological: Positive for headaches.       Objective:   Vitals:   12/27/16 1325  BP: 116/74  Pulse: 81  Resp: 16  Temp: 97.7 F (36.5 C)   Filed Weights   12/27/16 1325  Weight: 172 lb (78 kg)   Body mass index is 26.94 kg/m.  Wt Readings from Last 3 Encounters:  12/27/16 172 lb (78 kg)  10/07/16 170 lb (77.1 kg)  08/06/16 170 lb (77.1 kg)     Physical Exam  Constitutional: He  appears well-developed and well-nourished. No distress.  Musculoskeletal: He exhibits no edema.  No joint deformities  or swelling  Skin: Skin is warm and dry. Rash (erythematous, rasied rash that is scaly, areas of exoriation) noted. He is not diaphoretic.          Assessment & Plan:   See Problem List for Assessment and Plan of chronic medical problems.

## 2016-12-27 NOTE — Progress Notes (Signed)
Pre visit review using our clinic review tool, if applicable. No additional management support is needed unless otherwise documented below in the visit note. 

## 2016-12-27 NOTE — Assessment & Plan Note (Signed)
Derm told him it was dermatitis - topical steroids not effective, at one point he did take oral steroids and it helped Will try high potency steroid topically Had neg ANA by derm Biopsy did not reveal cause ? Autoimmune - ? Discoid lupus Will refer to rheumatology

## 2016-12-30 ENCOUNTER — Telehealth: Payer: Self-pay | Admitting: Cardiovascular Disease

## 2016-12-30 NOTE — Telephone Encounter (Signed)
Spoke with patient who states his PCP advised that his rash is related to an autoimmune disorder and that he can resume Plavix.  I thanked him for the call.

## 2016-12-30 NOTE — Telephone Encounter (Signed)
New message  Pt states he is returning Rn call. Please call back to discuss

## 2017-01-23 DIAGNOSIS — M19041 Primary osteoarthritis, right hand: Secondary | ICD-10-CM | POA: Diagnosis not present

## 2017-01-23 DIAGNOSIS — Z79899 Other long term (current) drug therapy: Secondary | ICD-10-CM | POA: Diagnosis not present

## 2017-01-23 DIAGNOSIS — M19042 Primary osteoarthritis, left hand: Secondary | ICD-10-CM | POA: Diagnosis not present

## 2017-01-23 DIAGNOSIS — L309 Dermatitis, unspecified: Secondary | ICD-10-CM | POA: Diagnosis not present

## 2017-02-20 DIAGNOSIS — M19041 Primary osteoarthritis, right hand: Secondary | ICD-10-CM | POA: Diagnosis not present

## 2017-02-20 DIAGNOSIS — L309 Dermatitis, unspecified: Secondary | ICD-10-CM | POA: Diagnosis not present

## 2017-02-20 DIAGNOSIS — M19042 Primary osteoarthritis, left hand: Secondary | ICD-10-CM | POA: Diagnosis not present

## 2017-02-20 DIAGNOSIS — Z79899 Other long term (current) drug therapy: Secondary | ICD-10-CM | POA: Diagnosis not present

## 2017-03-15 ENCOUNTER — Other Ambulatory Visit: Payer: Self-pay | Admitting: Cardiovascular Disease

## 2017-03-30 ENCOUNTER — Encounter: Payer: Self-pay | Admitting: Cardiovascular Disease

## 2017-04-01 ENCOUNTER — Other Ambulatory Visit: Payer: Self-pay | Admitting: Cardiovascular Disease

## 2017-04-09 DIAGNOSIS — R7303 Prediabetes: Secondary | ICD-10-CM | POA: Insufficient documentation

## 2017-04-09 NOTE — Progress Notes (Signed)
Subjective:    Patient ID: Collin [name], male    DOB: 1925-02-11, 83 y.o.   MRN: [id number]  HPI The patient is here for follow up.  He still has the rash and will be seeing dermatology at [facility] this week.   Hypertension: He is taking his medication daily. He is compliant with a low sodium diet.  He denies chest pain, palpitations, edema, shortness of breath and regular headaches. He is exercising regularly - treadmill 5/week and bowling.       GERD:  He is taking his medication daily as prescribed.  He denies any GERD symptoms and feels his GERD is well controlled.   Hyperglycemia:  He is eating a low sugar/carb diet.  He is exercising regularly.    Anemia:  He denies fatigue, lightheadedness.    CKD:  He is not drinking much water - drinks a lot of coffee.  He does not nsaids daily.      Medications and allergies reviewed with patient and updated if appropriate.  Patient Active Problem List   Diagnosis Date Noted  . Hyperglycemia 04/09/2017  . Fatty liver 06/18/2016  . Rash and nonspecific skin eruption 06/13/2016  . LUQ fullness 06/13/2016  . Hypertriglyceridemia 04/14/2016  . H/O bacterial endocarditis 04/06/2016  . Myalgia and myositis 12/18/2015  . Abnormal CT of the abdomen 12/31/2013  . Abnormal chest x-ray 12/31/2013  . Gynecomastia   . Spinal stenosis, lumbar 05/12/2012  . CAD (coronary artery disease)   . Ejection fraction   . Carotid artery disease ([place])   . CERVICAL RADICULOPATHY, LEFT 12/07/2009  . SKIN CANCER, HX OF 12/07/2009  . Renal cell carcinoma ([place]) 07/18/2009  . ANEMIA, 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  . COPD 07/18/2009  . GERD 07/18/2009  . LOW BACK PAIN SYNDROME 07/07/2009  . DYSFUNCTION OF EUSTACHIAN TUBE 04/23/2008  . Essential hypertension 04/23/2008  . REACTIVE AIRWAY DISEASE 03/28/2008  . Hyperlipidemia 01/25/2007  . Cancer ([place]) 02/28/2001    Current Outpatient Prescriptions on File Prior to Visit  Medication Sig Dispense Refill    . alfuzosin (UROXATRAL) 10 MG 24 hr tablet Take 10 mg by mouth daily.     Marland Kitchen amoxicillin (AMOXIL) 500 MG capsule TAKE 4 CAPSULES BY MOUTH 1 HOUR BEFORE DENTAL APPOINTMENT 4 capsule 0  . aspirin 81 MG tablet Take 81 mg by mouth daily.      . clopidogrel (PLAVIX) 75 MG tablet Take 1 tablet (75 mg total) by mouth daily. 90 tablet 0  . finasteride (PROSCAR) 5 MG tablet Take 5 mg by mouth daily.    . Multiple Vitamin (MULTIVITAMIN) tablet Take 1 tablet by mouth daily.      . Probiotic Product (ALIGN) 4 MG CAPS Taking daily    . ranitidine (ZANTAC) 150 MG tablet Take 150 mg by mouth at bedtime as needed.     . triamcinolone ointment (KENALOG) 0.5 % Apply 1 application topically 2 (two) times daily. 30 g 1  . valsartan (DIOVAN) 160 MG tablet Take 1 tablet (160 mg total) by mouth daily. 90 tablet 0   No current facility-administered medications on file prior to visit.     Past Medical History:  Diagnosis Date  . Allergy   . Anemia   . Arthritis    gout  . Bladder cancer ([place])   . BPH (benign prostatic hyperplasia)   . CAD (coronary artery disease) 2005   DES January 2 005, residual disease  / Myoview  . Cancer ([place]) 02/28/2001  Left ureter cancer/bladder  . Carcinoma, renal cell ([place])   . Carotid artery disease ([place])    Doppler, September, 2010, normal, no carotid artery disease  . COPD (chronic obstructive pulmonary disease) ([place])   . Dysfunction of eustachian tube   . Ejection fraction    EF 55%, echo, January, 2009  . Encounter for long-term (current) use of other medications   . Enlarged prostate   . GERD (gastroesophageal reflux disease)   . Gynecomastia    b/l  . HTN (hypertension)   . Hx of cystoscopy 11/02/12   negative  . Hx of skin cancer, basal cell   . Hypercholesterolemia   . Hyperlipidemia   . Low back pain syndrome   . Myocardial infarction ([place])    hx acute  . Nocturia   . Organic impotence   . Pulmonary nodule    Chest CT, followed  . Reactive airway disease    . 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 Staph infection 04/1997  . Thrombocytopenia ([place])     Past Surgical History:  Procedure Laterality Date  . BACK SURGERY  02/1996  . bladder tumors  2003, 2004, 01/2009   removed  . CARDIAC CATHETERIZATION    . CHOLECYSTECTOMY  07/2003  . 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 NEPHRECTOMY  02/2001   left kidney, ureter  . osteomyelitis     staph aureus  . URETERECTOMY  2003   for cancer    Social History   Social History  . Marital status: Married    Spouse name: N/A  . Number of children: N/A  . Years of education: N/A   Occupational History  . 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 Smoking status: Former Smoker    Types: Pipe    Quit date: 12/27/1991  . Smokeless tobacco: Never Used     Comment: Quit 20-30 years ago as of 2012  . Alcohol use No  . Drug use: No  . Sexual activity: Not on file   Other Topics Concern  . Not on file   Social History Narrative   30 minutes a day for 5 days a week    Family History  Problem Relation Age of Onset  . Heart failure Mother 55  . Diabetes Mother   . Coronary artery disease Brother   . Diabetes Brother   . Drug abuse Brother   . Diabetes Sister   . Diabetes Sister     Review of Systems  Constitutional: Negative for chills and fever.  Respiratory: Negative for cough, shortness of breath and wheezing.   Cardiovascular: Positive for leg swelling (mild). Negative for chest pain and palpitations.  Skin: Positive for rash.  Neurological: Negative for light-headedness and headaches.       Objective:   Vitals:   04/10/17 1049  BP: 118/62  Pulse: 67  Resp: 16  Temp: 98 F (36.7 C)   Wt Readings from Last 3 Encounters:  04/10/17 172 lb (78 kg)  12/27/16 172 lb (78 kg)  10/07/16 170 lb (77.1 kg)   Body mass index is 26.94 kg/m.   Physical Exam    Constitutional: Appears  well-developed and well-nourished. No distress.  HENT:  Head: Normocephalic and atraumatic.  Neck: Neck supple. No tracheal deviation present. No thyromegaly present.  No cervical lymphadenopathy Cardiovascular: Normal rate, regular rhythm and  normal heart sounds.   No murmur heard. No carotid bruit .  No edema Pulmonary/Chest: Effort normal and breath sounds normal. No respiratory distress. No has no wheezes. No rales.  Skin: Skin is warm and dry. Not diaphoretic.  Psychiatric: Normal mood and affect. Behavior is normal.      Assessment & Plan:    See Problem List for Assessment and Plan of chronic medical problems.

## 2017-04-09 NOTE — Patient Instructions (Addendum)
  Test(s) ordered today. Your results will be released to [facility] (or called to you) after review, usually within 72hours after test completion. If any changes need to be made, you will be notified at that same time.  All other Health Maintenance issues reviewed.   All recommended immunizations and age-appropriate screenings are up-to-date or discussed.  No immunizations administered today.   Medications reviewed and updated.  No changes recommended at this time.  Your prescription(s) have been submitted to your pharmacy. Please take as directed and contact our office if you believe you are having problem(s) with the medication(s).    Please followup in 6 months - wellness and follow up visit

## 2017-04-10 ENCOUNTER — Ambulatory Visit (INDEPENDENT_AMBULATORY_CARE_PROVIDER_SITE_OTHER): Payer: Medicare Other | Admitting: Internal Medicine

## 2017-04-10 ENCOUNTER — Encounter: Payer: Self-pay | Admitting: Internal Medicine

## 2017-04-10 VITALS — BP 118/62 | HR 67 | Temp 98.0°F | Resp 16 | Ht 67.0 in | Wt 172.0 lb

## 2017-04-10 DIAGNOSIS — R739 Hyperglycemia, unspecified: Secondary | ICD-10-CM

## 2017-04-10 DIAGNOSIS — I10 Essential (primary) hypertension: Secondary | ICD-10-CM | POA: Diagnosis not present

## 2017-04-10 DIAGNOSIS — D649 Anemia, unspecified: Secondary | ICD-10-CM

## 2017-04-10 DIAGNOSIS — E7800 Pure hypercholesterolemia, unspecified: Secondary | ICD-10-CM

## 2017-04-10 DIAGNOSIS — K219 Gastro-esophageal reflux disease without esophagitis: Secondary | ICD-10-CM

## 2017-04-10 MED ORDER — TRIAMCINOLONE ACETONIDE 0.5 % EX OINT: 1.0000 "application " | TOPICAL_OINTMENT | Freq: Two times a day (BID) | CUTANEOUS | 1 refills | Status: DC

## 2017-04-10 NOTE — Progress Notes (Signed)
Pre visit review using our clinic review tool, if applicable. No additional management support is needed unless otherwise documented below in the visit note. 

## 2017-04-12 ENCOUNTER — Other Ambulatory Visit (INDEPENDENT_AMBULATORY_CARE_PROVIDER_SITE_OTHER): Payer: Medicare Other

## 2017-04-12 DIAGNOSIS — K219 Gastro-esophageal reflux disease without esophagitis: Secondary | ICD-10-CM

## 2017-04-12 DIAGNOSIS — I10 Essential (primary) hypertension: Secondary | ICD-10-CM | POA: Diagnosis not present

## 2017-04-12 DIAGNOSIS — D649 Anemia, unspecified: Secondary | ICD-10-CM | POA: Diagnosis not present

## 2017-04-12 DIAGNOSIS — E7800 Pure hypercholesterolemia, unspecified: Secondary | ICD-10-CM

## 2017-04-12 DIAGNOSIS — R739 Hyperglycemia, unspecified: Secondary | ICD-10-CM

## 2017-04-12 LAB — TSH: TSH: 4.5 u[IU]/mL (ref 0.35–4.50)

## 2017-04-12 LAB — COMPREHENSIVE METABOLIC PANEL
ALT: 16 U/L (ref 0–53)
AST: 15 U/L (ref 0–37)
Albumin: 4.1 g/dL (ref 3.5–5.2)
Alkaline Phosphatase: 47 U/L (ref 39–117)
BILIRUBIN TOTAL: 0.9 mg/dL (ref 0.2–1.2)
BUN: 22 mg/dL (ref 6–23)
CHLORIDE: 104 meq/L (ref 96–112)
CO2: 31 mEq/L (ref 19–32)
CREATININE: 1.69 mg/dL — AB (ref 0.40–1.50)
Calcium: 9.1 mg/dL (ref 8.4–10.5)
GFR: 40.74 mL/min — ABNORMAL LOW (ref >60.00)
Glucose, Bld: 110 mg/dL — ABNORMAL HIGH (ref 70–99)
Potassium: 4.9 mEq/L (ref 3.5–5.1)
SODIUM: 140 meq/L (ref 135–145)
Total Protein: 6.4 g/dL (ref 6.0–8.3)

## 2017-04-12 LAB — CBC WITH DIFFERENTIAL/PLATELET
BASOS PCT: 0.7 % (ref 0.0–3.0)
Basophils Absolute: 0 10*3/uL (ref 0.0–0.1)
EOS PCT: 5.7 % — AB (ref 0.0–5.0)
Eosinophils Absolute: 0.2 10*3/uL (ref 0.0–0.7)
HCT: 40.2 % (ref 39.0–52.0)
HEMOGLOBIN: 13.7 g/dL (ref 13.0–17.0)
Lymphocytes Relative: 30.4 % (ref 12.0–46.0)
Lymphs Abs: 1.3 10*3/uL (ref 0.7–4.0)
MCHC: 34.2 g/dL (ref 30.0–36.0)
MCV: 94.2 fl (ref 78.0–100.0)
MONO ABS: 0.6 10*3/uL (ref 0.1–1.0)
MONOS PCT: 15.3 % — AB (ref 3.0–12.0)
Neutro Abs: 2 10*3/uL (ref 1.4–7.7)
Neutrophils Relative %: 47.9 % (ref 43.0–77.0)
Platelets: 130 10*3/uL — ABNORMAL LOW (ref 150.0–400.0)
RBC: 4.27 Mil/uL (ref 4.22–5.81)
RDW: 15 % (ref 11.5–15.5)
WBC: 4.2 10*3/uL (ref 4.0–10.5)

## 2017-04-12 LAB — FERRITIN: Ferritin: 62 ng/mL (ref 22.0–322.0)

## 2017-04-12 LAB — IRON: IRON: 77 ug/dL (ref 42–165)

## 2017-04-12 LAB — LIPID PANEL
CHOL/HDL RATIO: 6
Cholesterol: 251 mg/dL — ABNORMAL HIGH (ref 0–200)
HDL: 39.3 mg/dL (ref >39.00)
Triglycerides: 463 mg/dL — ABNORMAL HIGH (ref 0.0–149.0)

## 2017-04-12 LAB — LDL CHOLESTEROL, DIRECT: Direct LDL: 88 mg/dL

## 2017-04-12 LAB — HEMOGLOBIN A1C: HEMOGLOBIN A1C: 6.6 % — AB (ref 4.6–6.5)

## 2017-04-13 ENCOUNTER — Telehealth: Payer: Self-pay | Admitting: Internal Medicine

## 2017-04-13 DIAGNOSIS — Z87891 Personal history of nicotine dependence: Secondary | ICD-10-CM | POA: Diagnosis not present

## 2017-04-13 DIAGNOSIS — I252 Old myocardial infarction: Secondary | ICD-10-CM | POA: Diagnosis not present

## 2017-04-13 DIAGNOSIS — I2510 Atherosclerotic heart disease of native coronary artery without angina pectoris: Secondary | ICD-10-CM | POA: Diagnosis not present

## 2017-04-13 DIAGNOSIS — L299 Pruritus, unspecified: Secondary | ICD-10-CM | POA: Diagnosis not present

## 2017-04-13 DIAGNOSIS — L820 Inflamed seborrheic keratosis: Secondary | ICD-10-CM | POA: Diagnosis not present

## 2017-04-13 DIAGNOSIS — I10 Essential (primary) hypertension: Secondary | ICD-10-CM | POA: Diagnosis not present

## 2017-04-13 NOTE — Telephone Encounter (Signed)
Called the patient to schedule awv. Lvm for patient to call office to schedule appt.

## 2017-04-15 ENCOUNTER — Encounter: Payer: Self-pay | Admitting: Internal Medicine

## 2017-04-15 DIAGNOSIS — E119 Type 2 diabetes mellitus without complications: Secondary | ICD-10-CM | POA: Insufficient documentation

## 2017-04-17 ENCOUNTER — Ambulatory Visit: Payer: Medicare Other | Admitting: Cardiovascular Disease

## 2017-04-21 ENCOUNTER — Encounter: Payer: Self-pay | Admitting: Cardiovascular Disease

## 2017-04-21 ENCOUNTER — Ambulatory Visit (INDEPENDENT_AMBULATORY_CARE_PROVIDER_SITE_OTHER): Payer: Medicare Other | Admitting: Cardiovascular Disease

## 2017-04-21 VITALS — BP 110/76 | HR 78 | Ht 66.5 in | Wt 165.8 lb

## 2017-04-21 DIAGNOSIS — I10 Essential (primary) hypertension: Secondary | ICD-10-CM

## 2017-04-21 DIAGNOSIS — I2510 Atherosclerotic heart disease of native coronary artery without angina pectoris: Secondary | ICD-10-CM | POA: Diagnosis not present

## 2017-04-21 MED ORDER — CLOPIDOGREL BISULFATE 75 MG PO TABS: 75.0000 mg | ORAL_TABLET | Freq: Every day | ORAL | 3 refills | Status: DC

## 2017-04-21 MED ORDER — VALSARTAN 80 MG PO TABS: 80.0000 mg | ORAL_TABLET | Freq: Every day | ORAL | 3 refills | Status: DC

## 2017-04-21 NOTE — Patient Instructions (Signed)
Medication Instructions:  DECREASE Valsartan to 80 mg once daily STOP Aspirin STOP Amoxicillin - you do not need antibiotics before dental procedures   Labwork: None Ordered   Testing/Procedures: None Ordered   Follow-Up: Your physician wants you to follow-up in: 6 months with Dr. Acie Fredrickson.  You will receive a reminder letter in the mail two months in advance. If you don't receive a letter, please call our office to schedule the follow-up appointment.   If you need a refill on your cardiac medications before your next appointment, please call your pharmacy.   Thank you for choosing [facility]! Christen Bame, RN [phone]

## 2017-04-21 NOTE — Progress Notes (Signed)
Cardiology Office Note   Date:  04/21/2017   ID:  Collin [name], DOB 1925-05-08, MRN [id number]  PCP:  Collin Rail, MD  Cardiologist:  Collin Moores, MD , previous patient of Collin [name]  Chief Complaint  Patient presents with  . Coronary Artery Disease   Problem List 1. CAD- s/p stenting in 2005.  2. Essential HTN    History of Present Illness: Collin [name] is a 83 y.o. male who presents for follow-up of coronary disease.  Had a stent placed by Collin [name] in 2005.  No CP , no dyspnea   Previously worked  in the [employer] organ[employer] for [employer] active Walks 30 minutes a day on the treadmill.   Goes bowling twice a week Took Atorvastatin for years but stopped due to leg pain .  Last lipids were drawn in Dec.  - showed markedly elevated Trig levels and high chol levels.   April 21, 2017:  Collin [name] has a hx of CAD Hyperlipidemia Was recently diagnosed with DM and He has greatly improved his diet. He's cut back on lots of carbohydrates. He's lost about 5 pounds. He's noticed that his blood pressure has been low. This morning he took only half of his valsartan tablet.  Takes SBE prophylasix - has done so for years.  No prosthetic valve and no artificial joints     Past Medical History:  Diagnosis Date  . Allergy   . Anemia   . Arthritis    gout  . Bladder cancer ([place])   . BPH (benign prostatic hyperplasia)   . CAD (coronary artery disease) 2005   DES January 2 005, residual disease  / Myoview  . Cancer ([place]) 02/28/2001   Left ureter cancer/bladder  . Carcinoma, renal cell ([place])   . Carotid artery disease ([place])    Doppler, September, 2010, normal, no carotid artery disease  . COPD (chronic obstructive pulmonary disease) ([place])   . Dysfunction of eustachian tube   . Ejection fraction    EF 55%, echo, January, 2009  . Encounter for long-term (current) use of other medications   . Enlarged prostate   . GERD (gastroesophageal reflux disease)   .  Gynecomastia    b/l  . HTN (hypertension)   . Hx of cystoscopy 11/02/12   negative  . Hx of skin cancer, basal cell   . Hypercholesterolemia   . Hyperlipidemia   . Low back pain syndrome   . Myocardial infarction ([place])    hx acute  . Nocturia   . Organic impotence   . Pulmonary nodule    Chest CT, followed  . Reactive airway disease   . 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 Staph infection 04/1997  . Thrombocytopenia ([place])     Past Surgical History:  Procedure Laterality Date  . BACK SURGERY  02/1996  . bladder tumors  2003, 2004, 01/2009   removed  . CARDIAC CATHETERIZATION    . CHOLECYSTECTOMY  07/2003  . 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 NEPHRECTOMY  02/2001   left kidney, ureter  . osteomyelitis     staph aureus  . URETERECTOMY  2003   for cancer    Patient Active Problem List   Diagnosis Date Noted  . CAD (coronary artery disease)     Priority: High  . Essential hypertension 04/23/2008    Priority:  High  . Hyperlipidemia 01/25/2007    Priority: High  . Diabetes ([place]) 04/15/2017  . Hyperglycemia 04/09/2017  . Fatty liver 06/18/2016  . Rash and nonspecific skin eruption 06/13/2016  . LUQ fullness 06/13/2016  . Hypertriglyceridemia 04/14/2016  . H/O bacterial endocarditis 04/06/2016  . Myalgia and myositis 12/18/2015  . Abnormal CT of the abdomen 12/31/2013  . Abnormal chest x-ray 12/31/2013  . Gynecomastia   . Spinal stenosis, lumbar 05/12/2012  . Ejection fraction   . Carotid artery disease ([place])   . CERVICAL RADICULOPATHY, LEFT 12/07/2009  . SKIN CANCER, HX OF 12/07/2009  . Renal cell carcinoma ([place]) 07/18/2009  . ANEMIA, 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  . COPD 07/18/2009  . GERD 07/18/2009  . LOW BACK PAIN SYNDROME 07/07/2009  . DYSFUNCTION OF EUSTACHIAN TUBE 04/23/2008  . REACTIVE AIRWAY DISEASE 03/28/2008  . Cancer ([place]) 02/28/2001      Current Outpatient Prescriptions    Medication Sig Dispense Refill  . alfuzosin (UROXATRAL) 10 MG 24 hr tablet Take 10 mg by mouth daily.     Marland Kitchen amoxicillin (AMOXIL) 500 MG capsule TAKE 4 CAPSULES BY MOUTH 1 HOUR BEFORE DENTAL APPOINTMENT 4 capsule 0  . aspirin 81 MG tablet Take 81 mg by mouth daily.      . clopidogrel (PLAVIX) 75 MG tablet Take 1 tablet (75 mg total) by mouth daily. 90 tablet 0  . finasteride (PROSCAR) 5 MG tablet Take 5 mg by mouth daily.    . Multiple Vitamin (MULTIVITAMIN) tablet Take 1 tablet by mouth daily.      . Probiotic Product (ALIGN) 4 MG CAPS Taking daily    . ranitidine (ZANTAC) 150 MG tablet Take 150 mg by mouth at bedtime as needed.     . triamcinolone ointment (KENALOG) 0.1 % Apply 1 application topically 2 (two) times daily.    . valsartan (DIOVAN) 160 MG tablet Take 1 tablet (160 mg total) by mouth daily. 90 tablet 0   No current facility-administered medications for this visit.     Allergies:   Atorvastatin; Peanut oil; and Trandolapril    Social History:  The patient  reports that he quit smoking about 25 years ago. His smoking use included Pipe. He has never used smokeless tobacco. He reports that he does not drink alcohol or use drugs.   Family History:  The patient's family history includes Coronary artery disease in his brother; Diabetes in his brother, mother, sister, and sister; Drug abuse in his brother; Heart failure (age of onset: 28) in his mother.    ROS:  Please see the history of present illness.  Patient denies fever, chills, headache, sweats, rash, change in vision, change in hearing, chest pain, cough, nausea or vomiting, urinary symptoms. All other systems are reviewed and are negative.     PHYSICAL EXAM: VS:  BP 110/76 (BP Location: Left Arm, Patient Position: Sitting, Cuff Size: Normal)   Pulse 78   Ht 5' 6.5" (1.689 m)   Wt 165 lb 12.8 oz (75.2 kg)   BMI 26.36 kg/m  , The patient looks great. He is oriented to person time and place. Affect is normal. Head is  atraumatic. Sclera and conjunctiva are normal. There is no jugulovenous distention. Lungs are clear. Respiratory effort is not labored. Cardiac exam reveals S1 and S2. The abdomen is soft. There is no peripheral edema. There are no musculoskeletal deformities. There are no skin rashes. Neurologic is grossly intact.  EKG:   EKG is done today and reviewed by me. There  is normal sinus rhythm. There is no change from the past.   Recent Labs: 04/12/2017: ALT 16; BUN 22; Creatinine, Ser 1.69; Hemoglobin 13.7; Platelets 130.0; Potassium 4.9; Sodium 140; TSH 4.50    Lipid Panel    Component Value Date/Time   CHOL 251 (H) 04/12/2017 0823   TRIG (H) 04/12/2017 0823    463.0 Triglyceride is over 400; calculations on Lipids are invalid.   HDL 39.30 04/12/2017 0823   CHOLHDL 6 04/12/2017 0823   VLDL 48.6 (H) 04/13/2016 0829   LDLCALC 49 02/02/2015 0841   LDLDIRECT 88.0 04/12/2017 0823      Wt Readings from Last 3 Encounters:  04/21/17 165 lb 12.8 oz (75.2 kg)  04/10/17 172 lb (78 kg)  12/27/16 172 lb (78 kg)      Current medicines are reviewed. The patient has a very good understanding of his medications. No changes are to be made.       ASSESSMENT AND PLAN:  1. CAD- s/p stenting in 2005.  - doing well. No angina    2. Essential HTN- BP Has been low. This is because of his weight loss and improvement in his diet. We will decrease the valsartan to 80 mg a day and he will continue to measure this on a regular basis.  3. Hyperlipidemia -   Lipids / trigs are very elevated.    is working on his diet   4.  He has been receiving SBE prophylaxis for many years. At this point I do not think that he needs SB prophylaxis. He does not have a prosthetic valve. He has no history of endocarditis. He does not have any prosthetic orthopedic joints. I suspect this is just a hold over from many years ago. We'll discontinue his PRN amoxicillin.    Collin Moores, MD  04/21/2017 8:44 AM    [address] [place],  [place], [place]  [address] Pager [phone] Phone: [phone]; Fax: [phone]

## 2017-06-15 DIAGNOSIS — L299 Pruritus, unspecified: Secondary | ICD-10-CM | POA: Diagnosis not present

## 2017-06-15 DIAGNOSIS — I10 Essential (primary) hypertension: Secondary | ICD-10-CM | POA: Diagnosis not present

## 2017-06-15 DIAGNOSIS — I2510 Atherosclerotic heart disease of native coronary artery without angina pectoris: Secondary | ICD-10-CM | POA: Diagnosis not present

## 2017-06-15 DIAGNOSIS — L820 Inflamed seborrheic keratosis: Secondary | ICD-10-CM | POA: Diagnosis not present

## 2017-06-15 DIAGNOSIS — I252 Old myocardial infarction: Secondary | ICD-10-CM | POA: Diagnosis not present

## 2017-06-15 DIAGNOSIS — Z87891 Personal history of nicotine dependence: Secondary | ICD-10-CM | POA: Diagnosis not present

## 2017-07-10 DIAGNOSIS — N183 Chronic kidney disease, stage 3 unspecified: Secondary | ICD-10-CM | POA: Insufficient documentation

## 2017-07-10 DIAGNOSIS — N189 Chronic kidney disease, unspecified: Secondary | ICD-10-CM | POA: Insufficient documentation

## 2017-07-10 NOTE — Progress Notes (Signed)
Subjective:    Patient ID: Collin [name], male    DOB: 1925/06/10, 83 y.o.   MRN: [id number]  HPI The patient is here for follow up.  Diabetes: This is a new diagnosis for him since 03/2017.  He is controlling his sugars with diet. He is more compliant with a diabetic diet. He has difficulty limiting his carb intake.  He is exercising regularly - treadmill, bowling. .   CKD: He is s/p left nephrectomy for cancer.  He takes tylenol as needed only.  He denies use of nsaids.  He is drinking some water, but could drink more.    CAD, Hypertension: He is taking his medication daily. He is compliant with a low sodium diet.  He denies chest pain, palpitations, edema,and regular headaches. He is exercising.        Medications and allergies reviewed with patient and updated if appropriate.  Patient Active Problem List   Diagnosis Date Noted  . Chronic kidney disease (CKD) 07/10/2017  . Diabetes ([place]) 04/15/2017  . Hyperglycemia 04/09/2017  . Fatty liver 06/18/2016  . Rash and nonspecific skin eruption 06/13/2016  . LUQ fullness 06/13/2016  . Hypertriglyceridemia 04/14/2016  . H/O bacterial endocarditis 04/06/2016  . Myalgia and myositis 12/18/2015  . Abnormal CT of the abdomen 12/31/2013  . Abnormal chest x-ray 12/31/2013  . Gynecomastia   . Spinal stenosis, lumbar 05/12/2012  . CAD (coronary artery disease)   . Carotid artery disease ([place])   . CERVICAL RADICULOPATHY, LEFT 12/07/2009  . SKIN CANCER, HX OF 12/07/2009  . Renal cell carcinoma ([place]) 07/18/2009  . ANEMIA, 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  . COPD 07/18/2009  . GERD 07/18/2009  . LOW BACK PAIN SYNDROME 07/07/2009  . DYSFUNCTION OF EUSTACHIAN TUBE 04/23/2008  . Essential hypertension 04/23/2008  . REACTIVE AIRWAY DISEASE 03/28/2008  . Hyperlipidemia 01/25/2007  . Cancer ([place]) 02/28/2001    Current Outpatient Prescriptions on File Prior to Visit  Medication Sig Dispense Refill  . alfuzosin (UROXATRAL) 10 MG 24 hr tablet Take 10  mg by mouth daily.     . clopidogrel (PLAVIX) 75 MG tablet Take 1 tablet (75 mg total) by mouth daily. 90 tablet 3  . finasteride (PROSCAR) 5 MG tablet Take 5 mg by mouth daily.    . Multiple Vitamin (MULTIVITAMIN) tablet Take 1 tablet by mouth daily.      . Probiotic Product (ALIGN) 4 MG CAPS Taking daily    . ranitidine (ZANTAC) 150 MG tablet Take 150 mg by mouth at bedtime as needed.     . triamcinolone ointment (KENALOG) 0.1 % Apply 1 application topically 2 (two) times daily.    . valsartan (DIOVAN) 80 MG tablet Take 1 tablet (80 mg total) by mouth daily. 90 tablet 3   No current facility-administered medications on file prior to visit.     Past Medical History:  Diagnosis Date  . Allergy   . Anemia   . Arthritis    gout  . Bladder cancer ([place])   . BPH (benign prostatic hyperplasia)   . CAD (coronary artery disease) 2005   DES January 2 005, residual disease  / Myoview  . Cancer ([place]) 02/28/2001   Left ureter cancer/bladder  . Carcinoma, renal cell ([place])   . Carotid artery disease ([place])    Doppler, September, 2010, normal, no carotid artery disease  . COPD (chronic obstructive pulmonary disease) ([place])   . Dysfunction of eustachian tube   . Ejection fraction    EF 55%, echo,  January, 2009  . Encounter for long-term (current) use of other medications   . Enlarged prostate   . GERD (gastroesophageal reflux disease)   . Gynecomastia    b/l  . HTN (hypertension)   . Hx of cystoscopy 11/02/12   negative  . Hx of skin cancer, basal cell   . Hypercholesterolemia   . Hyperlipidemia   . Low back pain syndrome   . Myocardial infarction ([place])    hx acute  . Nocturia   . Organic impotence   . Pulmonary nodule    Chest CT, followed  . Reactive airway disease   . 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 Staph infection 04/1997  . Thrombocytopenia ([place])     Past Surgical History:  Procedure Laterality Date  . BACK SURGERY  02/1996  . bladder tumors   2003, 2004, 01/2009   removed  . CARDIAC CATHETERIZATION    . CHOLECYSTECTOMY  07/2003  . 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 NEPHRECTOMY  02/2001   left kidney, ureter  . osteomyelitis     staph aureus  . URETERECTOMY  2003   for cancer    Social History   Social History  . Marital status: Married    Spouse name: N/A  . Number of children: N/A  . Years of education: N/A   Occupational History  . 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 Smoking status: Former Smoker    Types: Pipe    Quit date: 12/27/1991  . Smokeless tobacco: Never Used     Comment: Quit 20-30 years ago as of 2012  . Alcohol use No  . Drug use: No  . Sexual activity: Not Asked   Other Topics Concern  . None   Social History Narrative   30 minutes a day for 5 days a week    Family History  Problem Relation Age of Onset  . Heart failure Mother 56  . Diabetes Mother   . Coronary artery disease Brother   . Diabetes Brother   . Drug abuse Brother   . Diabetes Sister   . Diabetes Sister     Review of Systems  Constitutional: Negative for chills and fever.  Respiratory: Positive for shortness of breath (with moderate exertion - not new). Negative for cough and wheezing.   Cardiovascular: Negative for chest pain, palpitations and leg swelling.  Skin: Positive for rash.  Neurological: Positive for headaches (occ, mild). Negative for light-headedness.       Objective:   Vitals:   07/11/17 0854  BP: 138/74  Pulse: 67  Resp: 16  Temp: 97.6 F (36.4 C)   Wt Readings from Last 3 Encounters:  07/11/17 170 lb (77.1 kg)  04/21/17 165 lb 12.8 oz (75.2 kg)  04/10/17 172 lb (78 kg)   Body mass index is 27.03 kg/m.   Physical Exam    Constitutional: Appears well-developed and well-nourished. No distress.  HENT:  Head: Normocephalic and atraumatic.  Neck: Neck supple. No tracheal deviation present. No thyromegaly  present.  No cervical lymphadenopathy Cardiovascular: Normal rate, regular rhythm and normal heart sounds.   No murmur heard. No carotid bruit .  No edema Pulmonary/Chest: Effort normal and breath sounds normal. No respiratory distress. No has no wheezes. No rales.  Skin: Skin is warm and dry. Not diaphoretic.  Psychiatric: Normal mood and affect.  Behavior is normal.      Assessment & Plan:    See Problem List for Assessment and Plan of chronic medical problems.

## 2017-07-10 NOTE — Patient Instructions (Addendum)

## 2017-07-10 NOTE — Assessment & Plan Note (Addendum)
This is new since 03/2017 Diet controlled - no need for medication Check a1c Advised to eat a reasonable diet and be as active as possible

## 2017-07-11 ENCOUNTER — Other Ambulatory Visit (INDEPENDENT_AMBULATORY_CARE_PROVIDER_SITE_OTHER): Payer: Medicare Other

## 2017-07-11 ENCOUNTER — Encounter: Payer: Self-pay | Admitting: Internal Medicine

## 2017-07-11 ENCOUNTER — Ambulatory Visit (INDEPENDENT_AMBULATORY_CARE_PROVIDER_SITE_OTHER): Payer: Medicare Other | Admitting: Internal Medicine

## 2017-07-11 VITALS — BP 138/74 | HR 67 | Temp 97.6°F | Resp 16 | Wt 170.0 lb

## 2017-07-11 DIAGNOSIS — I10 Essential (primary) hypertension: Secondary | ICD-10-CM | POA: Diagnosis not present

## 2017-07-11 DIAGNOSIS — N189 Chronic kidney disease, unspecified: Secondary | ICD-10-CM

## 2017-07-11 DIAGNOSIS — I2510 Atherosclerotic heart disease of native coronary artery without angina pectoris: Secondary | ICD-10-CM

## 2017-07-11 DIAGNOSIS — E119 Type 2 diabetes mellitus without complications: Secondary | ICD-10-CM | POA: Diagnosis not present

## 2017-07-11 LAB — LIPID PANEL
CHOLESTEROL: 183 mg/dL (ref 0–200)
HDL: 36 mg/dL — AB (ref >39.00)
NonHDL: 147.44
TRIGLYCERIDES: 257 mg/dL — AB (ref 0.0–149.0)
Total CHOL/HDL Ratio: 5
VLDL: 51.4 mg/dL — ABNORMAL HIGH (ref 0.0–40.0)

## 2017-07-11 LAB — COMPREHENSIVE METABOLIC PANEL
ALBUMIN: 4.2 g/dL (ref 3.5–5.2)
ALK PHOS: 54 U/L (ref 39–117)
ALT: 16 U/L (ref 0–53)
AST: 16 U/L (ref 0–37)
BILIRUBIN TOTAL: 0.8 mg/dL (ref 0.2–1.2)
BUN: 22 mg/dL (ref 6–23)
CALCIUM: 9.6 mg/dL (ref 8.4–10.5)
CO2: 31 mEq/L (ref 19–32)
Chloride: 103 mEq/L (ref 96–112)
Creatinine, Ser: 1.73 mg/dL — ABNORMAL HIGH (ref 0.40–1.50)
GFR: 39.63 mL/min — AB (ref >60.00)
Glucose, Bld: 113 mg/dL — ABNORMAL HIGH (ref 70–99)
Potassium: 5 mEq/L (ref 3.5–5.1)
Sodium: 139 mEq/L (ref 135–145)
TOTAL PROTEIN: 6.4 g/dL (ref 6.0–8.3)

## 2017-07-11 LAB — LDL CHOLESTEROL, DIRECT: Direct LDL: 86 mg/dL

## 2017-07-11 LAB — HEMOGLOBIN A1C: HEMOGLOBIN A1C: 6.1 % (ref 4.6–6.5)

## 2017-07-11 NOTE — Assessment & Plan Note (Signed)
BP well controlled Current regimen effective and well tolerated Continue current medications at current doses cmp  

## 2017-07-11 NOTE — Assessment & Plan Note (Signed)
Asymptomatic Exercising regularly Continue current mediations

## 2017-07-11 NOTE — Assessment & Plan Note (Signed)
S/p nephrectomy Avoids nsaids, could increase fluid cmp

## 2017-07-17 ENCOUNTER — Other Ambulatory Visit: Payer: Self-pay

## 2017-08-08 DIAGNOSIS — L309 Dermatitis, unspecified: Secondary | ICD-10-CM | POA: Diagnosis not present

## 2017-08-08 DIAGNOSIS — L820 Inflamed seborrheic keratosis: Secondary | ICD-10-CM | POA: Diagnosis not present

## 2017-08-08 DIAGNOSIS — L570 Actinic keratosis: Secondary | ICD-10-CM | POA: Diagnosis not present

## 2017-08-14 ENCOUNTER — Telehealth: Payer: Self-pay | Admitting: Cardiovascular Disease

## 2017-08-14 MED ORDER — LOSARTAN POTASSIUM 50 MG PO TABS: 50.0000 mg | ORAL_TABLET | Freq: Every day | ORAL | 3 refills | Status: DC

## 2017-08-14 NOTE — Telephone Encounter (Signed)
Spoke with patient and advised that I will send Rx for Losartan 50 mg in the place of Valsartan. He states he went back to taking Valsartan 160 mg because he did not feel like the 80 mg was strong enough. I advised him to monitor BP at home after switching to the Losartan and to call back if he feels he needs a stronger dose. He verbalized understanding and agreement and thanked me for the call.

## 2017-08-14 NOTE — Telephone Encounter (Signed)
New message   Pt c/o medication issue:  1. Name of Medication: valsartan  2. How are you currently taking this medication (dosage and times per day)? 160mg   3. Are you having a reaction (difficulty breathing--STAT)? no  4. What is your medication issue? Pt states that he would like a change of medication for valsartan and wants to know what his insurance would cover in place of it

## 2017-08-29 ENCOUNTER — Telehealth: Payer: Self-pay | Admitting: Cardiovascular Disease

## 2017-08-29 NOTE — Telephone Encounter (Signed)
New message    Pt c/o BP issue: STAT if pt c/o blurred vision, one-sided weakness or slurred speech  1. What are your last 5 BP readings? 135/69, 155/70  2. Are you having any other symptoms (ex. Dizziness, headache, blurred vision, passed out)? headache  3. What is your BP issue? Pt is calling stating his new medication for his bp is not working, his bp is running high.

## 2017-08-29 NOTE — Telephone Encounter (Signed)
He should continue to monitor his BP He may need to increase the dose but we will need to be cautious with that given his elevated creatinine. Have him report back after several weeks with BP readings

## 2017-08-29 NOTE — Telephone Encounter (Signed)
Spoke with patient who states he is concerned about elevated BP readings since switching from valsartan to losartan. He reports most BP readings are high 149'F mmHg systolic and 02'O to 37'C mmHg diastolic. He states last night BP was 155/70 mmHg and he took an additional ASA 81 mg. He states when he woke up this morning, systolic BP was 588 mmHg. It was 137/69 mmHg this morning after breakfast. I asked about hydration and he states he drinks a little bit of water, but mostly coffee. I advised that if losartan is increased at this time, that it will impact his kidney function and could cause him to have some hypotension. I advised him to drink 64 oz of water daily and to continue to monitor BP. I advised that systolic parameter is 502 mmHg. He verbalized understanding and agreement with plan and will call back with questions or concerns. He thanked me for the call.

## 2017-09-18 DIAGNOSIS — Z961 Presence of intraocular lens: Secondary | ICD-10-CM | POA: Diagnosis not present

## 2017-09-18 DIAGNOSIS — Z947 Corneal transplant status: Secondary | ICD-10-CM | POA: Diagnosis not present

## 2017-09-18 DIAGNOSIS — H524 Presbyopia: Secondary | ICD-10-CM | POA: Diagnosis not present

## 2017-09-21 DIAGNOSIS — Z23 Encounter for immunization: Secondary | ICD-10-CM | POA: Diagnosis not present

## 2017-09-26 ENCOUNTER — Encounter: Payer: Self-pay | Admitting: Internal Medicine

## 2017-10-04 ENCOUNTER — Ambulatory Visit (INDEPENDENT_AMBULATORY_CARE_PROVIDER_SITE_OTHER): Payer: Medicare Other | Admitting: *Deleted

## 2017-10-04 ENCOUNTER — Telehealth: Payer: Self-pay | Admitting: *Deleted

## 2017-10-04 VITALS — BP 143/82 | HR 58 | Resp 20 | Ht 67.0 in | Wt 172.0 lb

## 2017-10-04 DIAGNOSIS — L602 Onychogryphosis: Secondary | ICD-10-CM

## 2017-10-04 DIAGNOSIS — Z0000 Encounter for general adult medical examination without abnormal findings: Secondary | ICD-10-CM

## 2017-10-04 MED ORDER — ZOSTER VAC RECOMB ADJUVANTED 50 MCG/0.5ML IM SUSR: 0.5000 mL | Freq: Once | INTRAMUSCULAR | 1 refills | Status: AC

## 2017-10-04 NOTE — Progress Notes (Addendum)
Subjective:   Collin [name] is a 83 y.o. male who presents for Medicare Annual/Subsequent preventive examination.  Review of Systems:  No ROS.  Medicare Wellness Visit. Additional risk factors are reflected in the social history.  Cardiac Risk Factors include: advanced age (>50men, >28 women);diabetes mellitus;dyslipidemia;hypertension;male gender Sleep patterns: feels rested on waking, gets up 2-3 times nightly to void and sleeps 6 hours nightly.    Home Safety/Smoke Alarms: Feels safe in home. Smoke alarms in place.  Living environment; residence and Firearm Safety: 2-story house, no firearms. Lives alone, no needs for DME, good support system Seat Belt Safety/Bike Helmet: Wears seat belt.     Objective:    Vitals: BP (!) 143/82   Pulse (!) 58   Resp 20   Ht 5\' 7"  (1.702 m)   Wt 172 lb (78 kg)   SpO2 98%   BMI 26.94 kg/m   Body mass index is 26.94 kg/m.  Tobacco History  Smoking Status  . Former Smoker  . Types: Pipe  . Quit date: 12/27/1991  Smokeless Tobacco  . Former Systems developer    Comment: Quit 20-30 years ago as of 2012     Counseling given: Not Answered   Past Medical History:  Diagnosis Date  . Allergy   . Anemia   . Arthritis    gout  . Bladder cancer ([place])   . BPH (benign prostatic hyperplasia)   . CAD (coronary artery disease) 2005   DES January 2 005, residual disease  / Myoview  . Cancer ([place]) 02/28/2001   Left ureter cancer/bladder  . Carcinoma, renal cell ([place])   . Carotid artery disease ([place])    Doppler, September, 2010, normal, no carotid artery disease  . COPD (chronic obstructive pulmonary disease) ([place])   . Dysfunction of eustachian tube   . Ejection fraction    EF 55%, echo, January, 2009  . Encounter for long-term (current) use of other medications   . Enlarged prostate   . GERD (gastroesophageal reflux disease)   . Gynecomastia    b/l  . HTN (hypertension)   . Hx of cystoscopy 11/02/12   negative  . Hx of skin cancer, basal cell   .  Hypercholesterolemia   . Hyperlipidemia   . Low back pain syndrome   . Myocardial infarction ([place])    hx acute  . Nocturia   . Organic impotence   . Pulmonary nodule    Chest CT, followed  . Reactive airway disease   . 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 Staph infection 04/1997  . Thrombocytopenia ([place])    Past Surgical History:  Procedure Laterality Date  . BACK SURGERY  02/1996  . bladder tumors  2003, 2004, 01/2009   removed  . CARDIAC CATHETERIZATION    . CHOLECYSTECTOMY  07/2003  . 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 NEPHRECTOMY  02/2001   left kidney, ureter  . osteomyelitis     staph aureus  . URETERECTOMY  2003   for cancer   Family History  Problem Relation Age of Onset  . Heart failure Mother 95  . Diabetes Mother   . Coronary artery disease Brother   . Diabetes Brother   . Drug abuse Brother   . Diabetes Sister   . Diabetes Sister    History  Sexual Activity  . Sexual activity: Not on file    Outpatient Encounter  Prescriptions as of 10/04/2017  Medication Sig  . alfuzosin (UROXATRAL) 10 MG 24 hr tablet Take 10 mg by mouth daily.   . clopidogrel (PLAVIX) 75 MG tablet Take 1 tablet (75 mg total) by mouth daily.  . finasteride (PROSCAR) 5 MG tablet Take 5 mg by mouth daily.  Marland Kitchen losartan (COZAAR) 50 MG tablet Take 1 tablet (50 mg total) by mouth daily.  . Multiple Vitamin (MULTIVITAMIN) tablet Take 1 tablet by mouth daily.    . Probiotic Product (ALIGN) 4 MG CAPS Taking daily  . ranitidine (ZANTAC) 150 MG tablet Take 150 mg by mouth at bedtime as needed.   . triamcinolone ointment (KENALOG) 0.1 % Apply 1 application topically 2 (two) times daily.  . [DISCONTINUED] aspirin EC 81 MG tablet Take 81 mg by mouth daily.   No facility-administered encounter medications on file as of 10/04/2017.     Activities of Daily Living In your present state of health, do you have any difficulty  performing the following activities: 10/04/2017  Hearing? N  Vision? N  Difficulty concentrating or making decisions? N  Walking or climbing stairs? N  Dressing or bathing? N  Doing errands, shopping? N  Preparing Food and eating ? N  Using the Toilet? N  In the past six months, have you accidently leaked urine? N  Do you have problems with loss of bowel control? N  Managing your Medications? N  Managing your Finances? N  Housekeeping or managing your Housekeeping? N  Some recent data might be hidden    Patient Care Team: Binnie Rail, MD as PCP - General (Internal Medicine)   Assessment:    Physical assessment deferred to PCP.  Exercise Activities and Dietary recommendations Current Exercise Habits: Home exercise routine, Type of exercise: treadmill;calisthenics (goes bowling), Time (Minutes): 45, Frequency (Times/Week): 4, Weekly Exercise (Minutes/Week): 180, Intensity: Mild, Exercise limited by: orthopedic condition(s)  Diet (meal preparation, eat out, water intake, caffeinated beverages, dairy products, fruits and vegetables): in general, a "healthy" diet  , well balanced, eats a variety of fruits and vegetables daily, limits salt, fat/cholesterol, sugar, caffeine, drinks 2-3 glasses of water daily.  Reviewed heart healthy and diabetic diet, encouraged patient to increase daily water intake. Diet education was provided via handout.     Goals      Patient Stated   . patient (pt-stated)          Find healthy snacks;        Other   . lose weight          Monitor diet for carbohydrates, increase protein, continue to walk on  treadmill, worship God, and enjoy life.      Fall Risk Fall Risk  10/04/2017 04/06/2016 03/19/2014  Falls in the past year? No No No   Depression Screen PHQ 2/9 Scores 10/04/2017 04/06/2016 03/19/2014  PHQ - 2 Score 1 0 0  PHQ- 9 Score 1 - -    Cognitive Function MMSE - Mini Mental State Exam 10/04/2017 04/06/2016  Not completed: - (No Data)   Orientation to time 5 -  Orientation to Place 5 -  Registration 3 -  Attention/ Calculation 5 -  Recall 3 -  Language- name 2 objects 2 -  Language- repeat 1 -  Language- follow 3 step command 3 -  Language- read & follow direction 1 -  Write a sentence 1 -  Copy design 1 -  Total score 30 -        Immunization History  Administered Date(s) Administered  . Influenza Whole 09/17/2008, 07/26/2012, 09/10/2013  . Influenza, High Dose Seasonal PF 08/29/2014, 10/07/2016  . Influenza-Unspecified 08/31/2015, 09/21/2017  . Pneumococcal Conjugate-13 11/07/2016  . Pneumococcal-Unspecified 12/26/1996  . Tdap 07/17/2017   Screening Tests Health Maintenance  Topic Date Due  . FOOT EXAM  06/17/1937  . OPHTHALMOLOGY EXAM  06/17/1937  . TETANUS/TDAP  06/17/1946  . INFLUENZA VACCINE  07/26/2017  . PNA vac Low Risk Adult  Completed      Plan:     Continue doing brain stimulating activities (puzzles, reading, adult coloring books, staying active) to keep memory sharp.   Continue to eat heart healthy diet (full of fruits, vegetables, whole grains, lean protein, water--limit salt, fat, and sugar intake) and increase physical activity as tolerated.  I have personally reviewed and noted the following in the patient's chart:   . Medical and social history . Use of alcohol, tobacco or illicit drugs  . Current medications and supplements . Functional ability and status . Nutritional status . Physical activity . Advanced directives . List of other physicians . Vitals . Screenings to include cognitive, depression, and falls . Referrals and appointments  In addition, I have reviewed and discussed with patient certain preventive protocols, quality metrics, and best practice recommendations. A written personalized care plan for preventive services as well as general preventive health recommendations were provided to patient.     Michiel Cowboy, RN  10/04/2017   Medical screening  examination/treatment/procedure(s) were performed by non-physician practitioner and as supervising physician I was immediately available for consultation/collaboration. I agree with above. Binnie Rail, MD

## 2017-10-04 NOTE — Telephone Encounter (Signed)
Not sure if [facility] does shingrix - but sent rx.  Ref for podiatry ordered

## 2017-10-04 NOTE — Progress Notes (Signed)
Pre visit review using our clinic review tool, if applicable. No additional management support is needed unless otherwise documented below in the visit note. 

## 2017-10-04 NOTE — Telephone Encounter (Signed)
During AWV, patient requested a referral to a podiatrist to cut his toenails, explaining that his nails are thick and it is hard for him to cut his toenails. Patient would like to have the Shingrix vaccination prescribed. He stated that he would go to [facility] outpatient pharmacy for the vaccination.

## 2017-10-04 NOTE — Patient Instructions (Addendum)
Continue doing brain stimulating activities (puzzles, reading, adult coloring books, staying active) to keep memory sharp.   Continue to eat heart healthy diet (full of fruits, vegetables, whole grains, lean protein, water--limit salt, fat, and sugar intake) and increase physical activity as tolerated.  [place] in [place], [place] in: [facility] Address: [address] [place], [place] [address] Phone: [phone]  Mr. Alveta Heimlich , Thank you for taking time to come for your Medicare Wellness Visit. I appreciate your ongoing commitment to your health goals. Please review the following plan we discussed and let me know if I can assist you in the future.   These are the goals we discussed: Goals      Patient Stated   . patient (pt-stated)          Find healthy snacks;        Other   . lose weight          Monitor diet for carbohydrates, increase protein, continue to walk treadmill, worship God, and enjoy life.       This is a list of the screening recommended for you and due dates:  Health Maintenance  Topic Date Due  . Complete foot exam   06/17/1937  . Eye exam for diabetics  06/17/1937  . Tetanus Vaccine  06/17/1946  . Flu Shot  07/26/2017  . Pneumonia vaccines  Completed

## 2017-10-05 MED FILL — SHINGRIX 50 MCG SUS: 50 | 1 days supply | Qty: 1 | Fill #0

## 2017-10-16 ENCOUNTER — Ambulatory Visit: Payer: Medicare Other | Admitting: Internal Medicine

## 2017-10-17 ENCOUNTER — Ambulatory Visit (INDEPENDENT_AMBULATORY_CARE_PROVIDER_SITE_OTHER): Payer: Medicare Other | Admitting: Podiatry

## 2017-10-17 VITALS — BP 152/85 | HR 66 | Ht 68.0 in | Wt 170.0 lb

## 2017-10-17 DIAGNOSIS — B351 Tinea unguium: Secondary | ICD-10-CM | POA: Diagnosis not present

## 2017-10-17 DIAGNOSIS — M79675 Pain in left toe(s): Secondary | ICD-10-CM | POA: Diagnosis not present

## 2017-10-17 DIAGNOSIS — E119 Type 2 diabetes mellitus without complications: Secondary | ICD-10-CM

## 2017-10-17 DIAGNOSIS — D689 Coagulation defect, unspecified: Secondary | ICD-10-CM

## 2017-10-17 DIAGNOSIS — M79674 Pain in right toe(s): Secondary | ICD-10-CM | POA: Diagnosis not present

## 2017-10-17 DIAGNOSIS — I2510 Atherosclerotic heart disease of native coronary artery without angina pectoris: Secondary | ICD-10-CM

## 2017-10-17 NOTE — Progress Notes (Addendum)
   Subjective:    Patient ID: Collin [name], male    DOB: 1925/09/17, 83 y.o.   MRN: [id number]  HPI This patient presents to the office for treatment of his thick nails.  He says the nails are painful walking and wearing his shoes.  He was referred to the office by Dr.  Quay Burow.  He is unable to self treat. This patient is diabetic.  He presents to the office for preventative foot care services. Chief Complaint  Patient presents with  . Nail Problem    bilateral thick, elongated toenails  . Numbness    occassional bilateral numbness and tingling       Review of Systems  All other systems reviewed and are negative.      Objective:   Physical Exam General Appearance  Alert, conversant and in no acute stress.  Vascular  Dorsalis pedis and posterior pulses are palpable  bilaterally.  Capillary return is within normal limits  Bilaterally. Temperature is within normal limits  Bilaterally  Neurologic  Senn-Weinstein monofilament wire test within normal limits  bilaterally. Muscle power  Within normal limits bilaterally.  Nails Thick disfigured discolored nails with subungual debride bilaterally from hallux to fifth toes bilaterally. No evidence of bacterial infection or drainage bilaterally.  Orthopedic  No limitations of motion of motion feet bilaterally.  No crepitus or effusions noted.  HAV  B/L.  Skin  normotropic skin with no porokeratosis noted bilaterally.  No signs of infections or ulcers noted.          Assessment & Plan:  Onychomycosis  B/L    IE  Debridement of nails.  RTC 3 months.   Gardiner Barefoot DPM

## 2017-11-29 DIAGNOSIS — N401 Enlarged prostate with lower urinary tract symptoms: Secondary | ICD-10-CM | POA: Diagnosis not present

## 2017-11-29 DIAGNOSIS — Z8551 Personal history of malignant neoplasm of bladder: Secondary | ICD-10-CM | POA: Diagnosis not present

## 2017-11-29 DIAGNOSIS — R3915 Urgency of urination: Secondary | ICD-10-CM | POA: Diagnosis not present

## 2017-12-15 DIAGNOSIS — Z8551 Personal history of malignant neoplasm of bladder: Secondary | ICD-10-CM | POA: Insufficient documentation

## 2017-12-15 DIAGNOSIS — C649 Malignant neoplasm of unspecified kidney, except renal pelvis: Secondary | ICD-10-CM | POA: Insufficient documentation

## 2017-12-15 DIAGNOSIS — M545 Low back pain, unspecified: Secondary | ICD-10-CM | POA: Insufficient documentation

## 2017-12-15 DIAGNOSIS — I10 Essential (primary) hypertension: Secondary | ICD-10-CM | POA: Insufficient documentation

## 2017-12-15 DIAGNOSIS — E7800 Pure hypercholesterolemia, unspecified: Secondary | ICD-10-CM | POA: Insufficient documentation

## 2017-12-15 DIAGNOSIS — C679 Malignant neoplasm of bladder, unspecified: Secondary | ICD-10-CM | POA: Insufficient documentation

## 2017-12-15 DIAGNOSIS — D649 Anemia, unspecified: Secondary | ICD-10-CM | POA: Insufficient documentation

## 2017-12-15 DIAGNOSIS — N529 Male erectile dysfunction, unspecified: Secondary | ICD-10-CM | POA: Insufficient documentation

## 2017-12-15 DIAGNOSIS — I219 Acute myocardial infarction, unspecified: Secondary | ICD-10-CM | POA: Insufficient documentation

## 2017-12-15 DIAGNOSIS — J45909 Unspecified asthma, uncomplicated: Secondary | ICD-10-CM | POA: Insufficient documentation

## 2017-12-15 DIAGNOSIS — R911 Solitary pulmonary nodule: Secondary | ICD-10-CM | POA: Insufficient documentation

## 2017-12-15 DIAGNOSIS — C4490 Unspecified malignant neoplasm of skin, unspecified: Secondary | ICD-10-CM | POA: Insufficient documentation

## 2017-12-15 DIAGNOSIS — K219 Gastro-esophageal reflux disease without esophagitis: Secondary | ICD-10-CM | POA: Insufficient documentation

## 2017-12-15 DIAGNOSIS — J449 Chronic obstructive pulmonary disease, unspecified: Secondary | ICD-10-CM | POA: Insufficient documentation

## 2017-12-15 DIAGNOSIS — N400 Enlarged prostate without lower urinary tract symptoms: Secondary | ICD-10-CM | POA: Insufficient documentation

## 2017-12-15 DIAGNOSIS — T7840XA Allergy, unspecified, initial encounter: Secondary | ICD-10-CM | POA: Insufficient documentation

## 2017-12-15 DIAGNOSIS — M1990 Unspecified osteoarthritis, unspecified site: Secondary | ICD-10-CM | POA: Insufficient documentation

## 2017-12-15 DIAGNOSIS — R9430 Abnormal result of cardiovascular function study, unspecified: Secondary | ICD-10-CM | POA: Insufficient documentation

## 2017-12-15 DIAGNOSIS — R351 Nocturia: Secondary | ICD-10-CM | POA: Insufficient documentation

## 2017-12-15 DIAGNOSIS — D696 Thrombocytopenia, unspecified: Secondary | ICD-10-CM | POA: Insufficient documentation

## 2017-12-15 DIAGNOSIS — Z85828 Personal history of other malignant neoplasm of skin: Secondary | ICD-10-CM | POA: Insufficient documentation

## 2017-12-27 MED FILL — SHINGRIX 50 MCG SUS: 50 | 1 days supply | Qty: 1 | Fill #1

## 2018-01-02 ENCOUNTER — Ambulatory Visit (INDEPENDENT_AMBULATORY_CARE_PROVIDER_SITE_OTHER): Payer: Medicare Other | Admitting: Cardiovascular Disease

## 2018-01-02 ENCOUNTER — Encounter: Payer: Self-pay | Admitting: Cardiovascular Disease

## 2018-01-02 VITALS — BP 124/64 | HR 69 | Ht 67.0 in | Wt 169.8 lb

## 2018-01-02 DIAGNOSIS — I2510 Atherosclerotic heart disease of native coronary artery without angina pectoris: Secondary | ICD-10-CM

## 2018-01-02 DIAGNOSIS — I10 Essential (primary) hypertension: Secondary | ICD-10-CM

## 2018-01-02 NOTE — Progress Notes (Signed)
Cardiology Office Note   Date:  01/02/2018   ID:  Collin [name], DOB 1925/11/26, MRN [id number]  PCP:  Binnie Rail, MD  Cardiologist:  Mertie Moores, MD , previous patient of Dr. Ron Parker  Chief Complaint  Patient presents with  . Coronary Artery Disease   Problem List 1. CAD- s/p stenting in 2005.  2. Essential HTN    History of Present Illness: Collin [name] is a 84 y.o. male who presents for follow-up of coronary disease.  Had a stent placed by Dr. Olevia Perches in 2005.  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   Goes bowling twice a week Took Atorvastatin for years but stopped due to leg pain .  Last lipids were drawn in Dec.  - showed markedly elevated Trig levels and high chol levels.   April 21, 2017:  Collin [name] has a hx of CAD Hyperlipidemia Was recently diagnosed with DM and He has greatly improved his diet. He's cut back on lots of carbohydrates. He's lost about 5 pounds. He's noticed that his blood pressure has been low. This morning he took only half of his valsartan tablet.  Takes SBE prophylasix - has done so for years.  No prosthetic valve and no artificial joints  Jan. 8, 2019:  Collin [name] is seen back for follow-up visit for his coronary artery disease and diabetes mellitus.   He walks on the treadmill 4-5 times a week for 30 minutes at a time.  He denies any angina while walking the treadmill.  He still bowls once or twice a week.  He notes that his blood pressure has been somewhat variable.  It seems to go up in the evenings.  Eats prepared meals every meal.       Past Medical History:  Diagnosis Date  . Allergy   . Anemia   . Arthritis    gout  . Bladder cancer ([place])   . BPH (benign prostatic hyperplasia)   . CAD (coronary artery disease) 2005   DES January 2 005, residual disease  / Myoview  . Cancer ([place]) 02/28/2001   Left ureter cancer/bladder    . Carcinoma, renal cell ([place])   . Carotid artery disease ([place])    Doppler, September, 2010, normal, no carotid artery disease  . COPD (chronic obstructive pulmonary disease) ([place])   . Dysfunction of eustachian tube   . Ejection fraction    EF 55%, echo, January, 2009  . Encounter for long-term (current) use of other medications   . Enlarged prostate   . GERD (gastroesophageal reflux disease)   . Gynecomastia    b/l  . HTN (hypertension)   . Hx of cystoscopy 11/02/12   negative  . Hx of skin cancer, basal cell   . Hypercholesterolemia   . Hyperlipidemia   . Low back pain syndrome   . Myocardial infarction ([place])    hx acute  . Nocturia   . Organic impotence   . Pulmonary nodule    Chest CT, followed  . Reactive airway disease   . 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 Staph infection 04/1997  . Thrombocytopenia ([place])     Past Surgical History:  Procedure Laterality Date  . BACK SURGERY  02/1996  . bladder tumors  2003, 2004, 01/2009   removed  . CARDIAC CATHETERIZATION    .  CHOLECYSTECTOMY  07/2003  . 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 NEPHRECTOMY  02/2001   left kidney, ureter  . osteomyelitis     staph aureus  . URETERECTOMY  2003   for cancer    Patient Active Problem List   Diagnosis Date Noted  . CAD (coronary artery disease)     Priority: High  . Essential hypertension 04/23/2008    Priority: High  . Hyperlipidemia 01/25/2007    Priority: High  . Thrombocytopenia ([place])   . Skin cancer   . Reactive airway disease   . Pulmonary nodule   . Organic impotence   . Nocturia   . Myocardial infarction ([place])   . Low back pain syndrome   . Hypercholesterolemia   . Hx of skin cancer, basal cell   . HTN (hypertension)   . GERD (gastroesophageal reflux disease)   . Enlarged prostate   . Ejection fraction   . COPD (chronic obstructive pulmonary disease) ([place])   . Carcinoma, renal cell  ([place])   . BPH (benign prostatic hyperplasia)   . Bladder cancer ([place])   . Arthritis   . Anemia   . Allergy   . Chronic kidney disease (CKD) 07/10/2017  . Diabetes ([place]) 04/15/2017  . Hyperglycemia 04/09/2017  . Fatty liver 06/18/2016  . Rash and nonspecific skin eruption 06/13/2016  . LUQ fullness 06/13/2016  . Hypertriglyceridemia 04/14/2016  . H/O bacterial endocarditis 04/06/2016  . Myalgia and myositis 12/18/2015  . Abnormal CT of the abdomen 12/31/2013  . Abnormal chest x-ray 12/31/2013  . Gynecomastia   . Hx of cystoscopy 11/02/2012  . Spinal stenosis, lumbar 05/12/2012  . Carotid artery disease ([place])   . CERVICAL RADICULOPATHY, LEFT 12/07/2009  . SKIN CANCER, HX OF 12/07/2009  . Renal cell carcinoma ([place]) 07/18/2009  . ANEMIA, 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  . COPD 07/18/2009  . GERD 07/18/2009  . LOW BACK PAIN SYNDROME 07/07/2009  . DYSFUNCTION OF EUSTACHIAN TUBE 04/23/2008  . REACTIVE AIRWAY DISEASE 03/28/2008  . Cancer ([place]) 02/28/2001  . Staph infection 04/25/1997      Current Outpatient Medications  Medication Sig Dispense Refill  . alfuzosin (UROXATRAL) 10 MG 24 hr tablet Take 10 mg by mouth daily.     . clopidogrel (PLAVIX) 75 MG tablet Take 1 tablet (75 mg total) by mouth daily. 90 tablet 3  . finasteride (PROSCAR) 5 MG tablet Take 5 mg by mouth daily.    . Multiple Vitamin (MULTIVITAMIN) tablet Take 1 tablet by mouth daily.      . Probiotic Product (ALIGN) 4 MG CAPS Taking daily    . ranitidine (ZANTAC) 150 MG tablet Take 150 mg by mouth at bedtime as needed for heartburn.     . triamcinolone ointment (KENALOG) 0.1 % Apply 1 application topically 2 (two) times daily.    Marland Kitchen losartan (COZAAR) 50 MG tablet Take 1 tablet (50 mg total) by mouth daily. 90 tablet 3   No current facility-administered medications for this visit.     Allergies:   Atorvastatin; Peanut oil; and Trandolapril    Social History:  The patient  reports that he quit smoking about 26 years ago.  His smoking use included pipe. He has quit using smokeless tobacco. He reports that he does not drink alcohol or use drugs.   Family History:  The patient's family history includes Coronary artery disease in his brother; Diabetes in his brother, mother, sister, and sister; Drug  abuse in his brother; Heart failure (age of onset: 54) in his mother.    ROS:  Please see the history of present illness.  Patient denies fever, chills, headache, sweats, rash, change in vision, change in hearing, chest pain, cough, nausea or vomiting, urinary symptoms. All other systems are reviewed and are negative.     Physical Exam: Blood pressure 124/64, pulse 69, height 5\' 7"  (1.702 m), weight 169 lb 12.8 oz (77 kg), SpO2 95 %.  GEN:  Well nourished, well developed in no acute distress HEENT: Normal NECK: No JVD; No carotid bruits LYMPHATICS: No lymphadenopathy CARDIAC: RR, no murmurs, rubs, gallops RESPIRATORY:  Clear to auscultation without rales, wheezing or rhonchi  ABDOMEN: Soft, non-tender, non-distended MUSCULOSKELETAL:  No edema; No deformity  SKIN: Warm and dry NEUROLOGIC:  Alert and oriented x 3   EKG:   Jan. 8 2019: NSR at 69.  Old Inf. MI , no changes.    Recent Labs: 04/12/2017: Hemoglobin 13.7; Platelets 130.0; TSH 4.50 07/11/2017: ALT 16; BUN 22; Creatinine, Ser 1.73; Potassium 5.0; Sodium 139    Lipid Panel    Component Value Date/Time   CHOL 183 07/11/2017 0920   TRIG 257.0 (H) 07/11/2017 0920   HDL 36.00 (L) 07/11/2017 0920   CHOLHDL 5 07/11/2017 0920   VLDL 51.4 (H) 07/11/2017 0920   LDLCALC 49 02/02/2015 0841   LDLDIRECT 86.0 07/11/2017 0920      Wt Readings from Last 3 Encounters:  01/02/18 169 lb 12.8 oz (77 kg)  10/17/17 170 lb (77.1 kg)  10/04/17 172 lb (78 kg)      Current medicines are reviewed. The patient has a very good understanding of his medications. No changes are to be made.       ASSESSMENT AND PLAN:  1. CAD- s/p stenting in 2005.  - doing well.  No angina    2. Essential HTN-BP .  BP is stable. Marland Kitchen  He eats prepared meals every day .  BP varies with the amount of salt his diet   3. Hyperlipidemia -   Lipids / trigs are very elevated.    is working on his diet . mangaged by his primary MD       Mertie Moores, MD  01/02/2018 3:39 PM    [address],  [place], [place]  [address] Pager [phone] Phone: [phone]; Fax: [phone]

## 2018-01-02 NOTE — Patient Instructions (Signed)
Medication Instructions:  Your physician recommends that you continue on your current medications as directed. Please refer to the Current Medication list given to you today.   Labwork: TODAY - basic metabolic panel   Testing/Procedures: None Ordered   Follow-Up: Your physician wants you to follow-up in: 6 months with Dr. Nahser. You will receive a reminder letter in the mail two months in advance. If you don't receive a letter, please call our office to schedule the follow-up appointment.   If you need a refill on your cardiac medications before your next appointment, please call your pharmacy.   Thank you for choosing [facility]! Michelle Swinyer, RN [phone]    

## 2018-01-04 ENCOUNTER — Other Ambulatory Visit: Payer: Medicare Other | Admitting: *Deleted

## 2018-01-04 DIAGNOSIS — I2510 Atherosclerotic heart disease of native coronary artery without angina pectoris: Secondary | ICD-10-CM

## 2018-01-04 DIAGNOSIS — I10 Essential (primary) hypertension: Secondary | ICD-10-CM

## 2018-01-04 NOTE — Addendum Note (Signed)
Addended by: Emmaline Life on: 01/04/2018 03:03 PM   Modules accepted: Orders

## 2018-01-05 ENCOUNTER — Other Ambulatory Visit: Payer: Medicare Other

## 2018-01-05 LAB — BASIC METABOLIC PANEL
BUN / CREAT RATIO: 10 (ref 10–24)
BUN: 17 mg/dL (ref 10–36)
CALCIUM: 9.2 mg/dL (ref 8.6–10.2)
CHLORIDE: 103 mmol/L (ref 96–106)
CO2: 28 mmol/L (ref 20–29)
CREATININE: 1.68 mg/dL — AB (ref 0.76–1.27)
GFR, EST AFRICAN AMERICAN: 41 mL/min/{1.73_m2} — AB (ref >59)
GFR, EST NON AFRICAN AMERICAN: 35 mL/min/{1.73_m2} — AB (ref >59)
Glucose: 128 mg/dL — ABNORMAL HIGH (ref 65–99)
Potassium: 4.7 mmol/L (ref 3.5–5.2)
Sodium: 143 mmol/L (ref 134–144)

## 2018-01-06 NOTE — Patient Instructions (Addendum)
  Test(s) ordered today. Your results will be released to MyChart (or called to you) after review, usually within 72hours after test completion. If any changes need to be made, you will be notified at that same time.  Medications reviewed and updated.  No changes recommended at this time.    Please followup in 6 months   

## 2018-01-06 NOTE — Progress Notes (Signed)
Subjective:    Patient ID: Collin [name], male    DOB: 1925-11-03, 84 y.o.   MRN: [id number]  HPI The patient is here for follow up.  Diabetes: He is controlling his diabetes with lifestyle. He is compliant with a diabetic diet. He is exercising regularly - bowling and treadmill. He checks his feet daily and denies foot lesions. He denies numbness or tingling in his feet.  He is up-to-date with an ophthalmology examination.   CAD, Hypertension: He is taking his medication daily. He was not compliant with a low sodium diet but has made changes.  He denies chest pain, palpitations, edema, shortness of breath and regular headaches. He is exercising regularly.  He does monitor his blood pressure at home - it has been erratic - 150/? At times.  He has cut down on his sodium intake per cardiology and is monitoring his BP.    Skin bumps:  He has two lumps that are new - they have been there about a couple of months.  One on his left ear and one on his right thumb. He has an appt with derm in two weeks.   Medications and allergies reviewed with patient and updated if appropriate.  Patient Active Problem List   Diagnosis Date Noted  . Thrombocytopenia ([place])   . Skin cancer   . Pulmonary nodule   . Organic impotence   . Nocturia   . Myocardial infarction ([place])   . Low back pain syndrome   . COPD (chronic obstructive pulmonary disease) ([place])   . BPH (benign prostatic hyperplasia)   . Bladder cancer ([place])   . Arthritis   . Allergy   . Chronic kidney disease (CKD) 07/10/2017  . Diabetes ([place]) 04/15/2017  . Fatty liver 06/18/2016  . Rash and nonspecific skin eruption 06/13/2016  . LUQ fullness 06/13/2016  . Hypertriglyceridemia 04/14/2016  . H/O bacterial endocarditis 04/06/2016  . Myalgia and myositis 12/18/2015  . Abnormal CT of the abdomen 12/31/2013  . Abnormal chest x-ray 12/31/2013  . Gynecomastia   . Hx of cystoscopy 11/02/2012  . Spinal stenosis, lumbar 05/12/2012  . CAD  (coronary artery disease)   . Carotid artery disease ([place])   . CERVICAL RADICULOPATHY, LEFT 12/07/2009  . SKIN CANCER, HX OF 12/07/2009  . Renal cell carcinoma ([place]) 07/18/2009  . GERD 07/18/2009  . Essential hypertension 04/23/2008  . Hyperlipidemia 01/25/2007  . Cancer ([place]) 02/28/2001    Current Outpatient Medications on File Prior to Visit  Medication Sig Dispense Refill  . alfuzosin (UROXATRAL) 10 MG 24 hr tablet Take 10 mg by mouth daily.     . clopidogrel (PLAVIX) 75 MG tablet Take 1 tablet (75 mg total) by mouth daily. 90 tablet 3  . finasteride (PROSCAR) 5 MG tablet Take 5 mg by mouth daily.    . Multiple Vitamin (MULTIVITAMIN) tablet Take 1 tablet by mouth daily.      . Probiotic Product (ALIGN) 4 MG CAPS Taking daily    . ranitidine (ZANTAC) 150 MG tablet Take 150 mg by mouth at bedtime as needed for heartburn.     . triamcinolone ointment (KENALOG) 0.1 % Apply 1 application topically 2 (two) times daily.    Marland Kitchen losartan (COZAAR) 50 MG tablet Take 1 tablet (50 mg total) by mouth daily. 90 tablet 3   No current facility-administered medications on file prior to visit.     Past Medical History:  Diagnosis Date  . Allergy   . Anemia   .  Arthritis    gout  . Bladder cancer ([place])   . BPH (benign prostatic hyperplasia)   . CAD (coronary artery disease) 2005   DES January 2 005, residual disease  / Myoview  . Cancer ([place]) 02/28/2001   Left ureter cancer/bladder  . Carcinoma, renal cell ([place])   . Carotid artery disease ([place])    Doppler, September, 2010, normal, no carotid artery disease  . COPD (chronic obstructive pulmonary disease) ([place])   . Dysfunction of eustachian tube   . Ejection fraction    EF 55%, echo, January, 2009  . Encounter for long-term (current) use of other medications   . Enlarged prostate   . GERD (gastroesophageal reflux disease)   . Gynecomastia    b/l  . HTN (hypertension)   . Hx of cystoscopy 11/02/12   negative  . Hx of skin cancer, basal  cell   . Hypercholesterolemia   . Hyperlipidemia   . Low back pain syndrome   . Myocardial infarction ([place])    hx acute  . Nocturia   . Organic impotence   . Pulmonary nodule    Chest CT, followed  . Reactive airway disease   . 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 Staph infection 04/1997  . Thrombocytopenia ([employer])     Past Surgical History:  Procedure Laterality Date  . BACK SURGERY  02/1996  . bladder tumors  2003, 2004, 01/2009   removed  . CARDIAC CATHETERIZATION    . CHOLECYSTECTOMY  07/2003  . 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 NEPHRECTOMY  02/2001   left kidney, ureter  . osteomyelitis     staph aureus  . URETERECTOMY  2003   for cancer    Social History   Socioeconomic History  . Marital status: Married    Spouse name: None  . Number of children: None  . Years of education: None  . Highest education level: None  Social Needs  . Financial resource strain: None  . Food insecurity - worry: None  . Food insecurity - inability: None  . Transportation needs - medical: None  . Transportation needs - non-medical: None  Occupational History  . 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 Smoking status: Former Smoker    Types: Pipe    Last attempt to quit: 12/27/1991    Years since quitting: 26.0  . Smokeless tobacco: Former Systems developer  . Tobacco comment: Quit 20-30 years ago as of 2012  Substance and Sexual Activity  . Alcohol use: No    Alcohol/week: 0.0 oz  . Drug use: No  . Sexual activity: None  Other Topics Concern  . None  Social History Narrative   30 minutes a day for 5 days a week    Family History  Problem Relation Age of Onset  . Heart failure Mother 42  . Diabetes Mother   . Coronary artery disease Brother   . Diabetes Brother   . Drug abuse Brother   . Diabetes Sister   . Diabetes Sister     Review of Systems  Constitutional: Negative for  chills and fever.  Respiratory: Negative for cough, shortness of breath and wheezing.   Cardiovascular: Negative for chest pain, palpitations and leg swelling.  Neurological: Positive for headaches (occ with higher BP). Negative for light-headedness and numbness.       Objective:   Vitals:  01/08/18 1341  BP: 138/76  Pulse: 72  Resp: 16  Temp: 97.8 F (36.6 C)  SpO2: 97%   Wt Readings from Last 3 Encounters:  01/08/18 165 lb (74.8 kg)  01/02/18 169 lb 12.8 oz (77 kg)  10/17/17 170 lb (77.1 kg)   Body mass index is 25.84 kg/m.   Physical Exam    Constitutional: Appears well-developed and well-nourished. No distress.  HENT:  Head: Normocephalic and atraumatic.  Neck: Neck supple. No tracheal deviation present. No thyromegaly present.  No cervical lymphadenopathy Cardiovascular: Normal rate, regular rhythm and normal heart sounds.   No murmur heard. No carotid bruit .  No edema Pulmonary/Chest: Effort normal and breath sounds normal. No respiratory distress. No has no wheezes. No rales.  Skin: Skin is warm and dry. Not diaphoretic. skin colored bump size of a pea on right thumb, pea sized lump on left ear that is has a scab and excoriations Psychiatric: Normal mood and affect. Behavior is normal.    Diabetic Foot Exam - Simple   Simple Foot Form Diabetic Foot exam was performed with the following findings:  Yes 01/08/2018  2:09 PM  Visual Inspection No deformities, no ulcerations, no other skin breakdown bilaterally:  Yes Sensation Testing Intact to touch and monofilament testing bilaterally:  Yes Pulse Check Posterior Tibialis and Dorsalis pulse intact bilaterally:  Yes Comments       Assessment & Plan:    See Problem List for Assessment and Plan of chronic medical problems.

## 2018-01-08 ENCOUNTER — Other Ambulatory Visit (INDEPENDENT_AMBULATORY_CARE_PROVIDER_SITE_OTHER): Payer: Medicare Other

## 2018-01-08 ENCOUNTER — Ambulatory Visit (INDEPENDENT_AMBULATORY_CARE_PROVIDER_SITE_OTHER): Payer: Medicare Other | Admitting: Internal Medicine

## 2018-01-08 ENCOUNTER — Encounter: Payer: Self-pay | Admitting: Internal Medicine

## 2018-01-08 VITALS — BP 138/76 | HR 72 | Temp 97.8°F | Resp 16 | Wt 165.0 lb

## 2018-01-08 DIAGNOSIS — I10 Essential (primary) hypertension: Secondary | ICD-10-CM | POA: Diagnosis not present

## 2018-01-08 DIAGNOSIS — D696 Thrombocytopenia, unspecified: Secondary | ICD-10-CM | POA: Diagnosis not present

## 2018-01-08 DIAGNOSIS — E119 Type 2 diabetes mellitus without complications: Secondary | ICD-10-CM

## 2018-01-08 DIAGNOSIS — I2510 Atherosclerotic heart disease of native coronary artery without angina pectoris: Secondary | ICD-10-CM

## 2018-01-08 LAB — LDL CHOLESTEROL, DIRECT: Direct LDL: 106 mg/dL

## 2018-01-08 LAB — CBC WITH DIFFERENTIAL/PLATELET
BASOS ABS: 0 10*3/uL (ref 0.0–0.1)
Basophils Relative: 0.7 % (ref 0.0–3.0)
EOS ABS: 0.2 10*3/uL (ref 0.0–0.7)
Eosinophils Relative: 3.5 % (ref 0.0–5.0)
HCT: 43.6 % (ref 39.0–52.0)
Hemoglobin: 14.6 g/dL (ref 13.0–17.0)
LYMPHS ABS: 1.3 10*3/uL (ref 0.7–4.0)
Lymphocytes Relative: 31 % (ref 12.0–46.0)
MCHC: 33.4 g/dL (ref 30.0–36.0)
MCV: 94.2 fl (ref 78.0–100.0)
Monocytes Absolute: 0.4 10*3/uL (ref 0.1–1.0)
Monocytes Relative: 9.8 % (ref 3.0–12.0)
NEUTROS ABS: 2.3 10*3/uL (ref 1.4–7.7)
NEUTROS PCT: 55 % (ref 43.0–77.0)
PLATELETS: 149 10*3/uL — AB (ref 150.0–400.0)
RBC: 4.62 Mil/uL (ref 4.22–5.81)
RDW: 13.1 % (ref 11.5–15.5)
WBC: 4.3 10*3/uL (ref 4.0–10.5)

## 2018-01-08 LAB — COMPREHENSIVE METABOLIC PANEL
ALT: 17 U/L (ref 0–53)
AST: 16 U/L (ref 0–37)
Albumin: 4.5 g/dL (ref 3.5–5.2)
Alkaline Phosphatase: 62 U/L (ref 39–117)
BUN: 21 mg/dL (ref 6–23)
CHLORIDE: 101 meq/L (ref 96–112)
CO2: 31 meq/L (ref 19–32)
Calcium: 9.3 mg/dL (ref 8.4–10.5)
Creatinine, Ser: 1.43 mg/dL (ref 0.40–1.50)
GFR: 49.32 mL/min — ABNORMAL LOW (ref >60.00)
GLUCOSE: 108 mg/dL — AB (ref 70–99)
POTASSIUM: 4.8 meq/L (ref 3.5–5.1)
SODIUM: 137 meq/L (ref 135–145)
Total Bilirubin: 1.1 mg/dL (ref 0.2–1.2)
Total Protein: 6.8 g/dL (ref 6.0–8.3)

## 2018-01-08 LAB — LIPID PANEL
CHOL/HDL RATIO: 6
Cholesterol: 199 mg/dL (ref 0–200)
HDL: 36 mg/dL — ABNORMAL LOW (ref >39.00)
NONHDL: 162.61
Triglycerides: 393 mg/dL — ABNORMAL HIGH (ref 0.0–149.0)
VLDL: 78.6 mg/dL — AB (ref 0.0–40.0)

## 2018-01-08 LAB — HEMOGLOBIN A1C: HEMOGLOBIN A1C: 6.2 % (ref 4.6–6.5)

## 2018-01-08 LAB — MICROALBUMIN / CREATININE URINE RATIO
Creatinine,U: 49.8 mg/dL
Microalb Creat Ratio: 1.4 mg/g (ref 0.0–30.0)
Microalb, Ur: <0.7 mg/dL (ref 0.0–1.9)

## 2018-01-08 NOTE — Assessment & Plan Note (Signed)
Controlled here today, but has been erratic at home - cardiology thinks it is related to salt intake -- he has decreased his salt intake Continue to monitor his BP at home Continue current medication  cmp

## 2018-01-08 NOTE — Assessment & Plan Note (Signed)
Just saw cardiology No active cardiac ischemia symptoms Continue regular exercise Continue current medications

## 2018-01-08 NOTE — Assessment & Plan Note (Signed)
cbc

## 2018-01-08 NOTE — Assessment & Plan Note (Signed)
Diet controlled Check a1c Continue regular exercise

## 2018-01-11 ENCOUNTER — Encounter: Payer: Self-pay | Admitting: Internal Medicine

## 2018-01-17 ENCOUNTER — Encounter: Payer: Self-pay | Admitting: Podiatry

## 2018-01-17 ENCOUNTER — Ambulatory Visit (INDEPENDENT_AMBULATORY_CARE_PROVIDER_SITE_OTHER): Payer: Medicare Other | Admitting: Podiatry

## 2018-01-17 DIAGNOSIS — D689 Coagulation defect, unspecified: Secondary | ICD-10-CM

## 2018-01-17 DIAGNOSIS — E119 Type 2 diabetes mellitus without complications: Secondary | ICD-10-CM | POA: Diagnosis not present

## 2018-01-17 DIAGNOSIS — M79675 Pain in left toe(s): Secondary | ICD-10-CM

## 2018-01-17 DIAGNOSIS — B351 Tinea unguium: Secondary | ICD-10-CM | POA: Diagnosis not present

## 2018-01-17 DIAGNOSIS — M79674 Pain in right toe(s): Secondary | ICD-10-CM

## 2018-01-17 NOTE — Progress Notes (Signed)
Complaint:  Visit Type: Patient returns to my office for continued preventative foot care services. Complaint: Patient states" my nails have grown long and thick and become painful to walk and wear shoes" Patient has been diagnosed with DM with no foot complications. The patient presents for preventative foot care services. No changes to ROS.  Patient is taling plavix.  Podiatric Exam: Vascular: dorsalis pedis and posterior tibial pulses are palpable bilateral. Capillary return is immediate. Temperature gradient is WNL. Skin turgor WNL  Sensorium: Normal Semmes Weinstein monofilament test. Normal tactile sensation bilaterally. Nail Exam: Pt has thick disfigured discolored nails with subungual debris noted bilateral entire nail hallux through fifth toenails Ulcer Exam: There is no evidence of ulcer or pre-ulcerative changes or infection. Orthopedic Exam: Muscle tone and strength are WNL. No limitations in general ROM. No crepitus or effusions noted. Foot type and digits show no abnormalities. Bony prominences are unremarkable. Skin: No Porokeratosis. No infection or ulcers  Diagnosis:  Onychomycosis, , Pain in right toe, pain in left toes  Treatment & Plan Procedures and Treatment: Consent by patient was obtained for treatment procedures.   Debridement of mycotic and hypertrophic toenails, 1 through 5 bilateral and clearing of subungual debris. No ulceration, no infection noted.  Return Visit-Office Procedure: Patient instructed to return to the office for a follow up visit 3 months for continued evaluation and treatment.    Gardiner Barefoot DPM

## 2018-01-23 ENCOUNTER — Other Ambulatory Visit: Payer: Self-pay | Admitting: Dermatology

## 2018-04-20 ENCOUNTER — Ambulatory Visit (INDEPENDENT_AMBULATORY_CARE_PROVIDER_SITE_OTHER): Payer: Medicare Other | Admitting: Podiatry

## 2018-04-20 ENCOUNTER — Encounter: Payer: Self-pay | Admitting: Podiatry

## 2018-04-20 DIAGNOSIS — E119 Type 2 diabetes mellitus without complications: Secondary | ICD-10-CM | POA: Diagnosis not present

## 2018-04-20 DIAGNOSIS — M79675 Pain in left toe(s): Principal | ICD-10-CM

## 2018-04-20 DIAGNOSIS — M79674 Pain in right toe(s): Principal | ICD-10-CM

## 2018-04-20 DIAGNOSIS — D689 Coagulation defect, unspecified: Secondary | ICD-10-CM | POA: Diagnosis not present

## 2018-04-20 DIAGNOSIS — B351 Tinea unguium: Secondary | ICD-10-CM | POA: Diagnosis not present

## 2018-04-20 DIAGNOSIS — M79676 Pain in unspecified toe(s): Secondary | ICD-10-CM | POA: Diagnosis not present

## 2018-04-20 NOTE — Progress Notes (Signed)
Complaint:  Visit Type: Patient returns to my office for continued preventative foot care services. Complaint: Patient states" my nails have grown long and thick and become painful to walk and wear shoes" Patient has been diagnosed with DM with no foot complications. The patient presents for preventative foot care services. No changes to ROS.  Patient is taling plavix.  Podiatric Exam: Vascular: dorsalis pedis and posterior tibial pulses are palpable bilateral. Capillary return is immediate. Temperature gradient is WNL. Skin turgor WNL  Sensorium: Normal Semmes Weinstein monofilament test. Normal tactile sensation bilaterally. Nail Exam: Pt has thick disfigured discolored nails with subungual debris noted bilateral entire nail hallux through fifth toenails Ulcer Exam: There is no evidence of ulcer or pre-ulcerative changes or infection. Orthopedic Exam: Muscle tone and strength are WNL. No limitations in general ROM. No crepitus or effusions noted. Foot type and digits show no abnormalities. Bony prominences are unremarkable. Skin: No Porokeratosis. No infection or ulcers  Diagnosis:  Onychomycosis, , Pain in right toe, pain in left toes  Treatment & Plan Procedures and Treatment: Consent by patient was obtained for treatment procedures.   Debridement of mycotic and hypertrophic toenails, 1 through 5 bilateral and clearing of subungual debris. No ulceration, no infection noted.  Return Visit-Office Procedure: Patient instructed to return to the office for a follow up visit 3 months for continued evaluation and treatment.    Gardiner Barefoot DPM

## 2018-05-16 ENCOUNTER — Other Ambulatory Visit: Payer: Self-pay | Admitting: Cardiovascular Disease

## 2018-07-05 ENCOUNTER — Encounter: Payer: Self-pay | Admitting: Cardiovascular Disease

## 2018-07-08 NOTE — Progress Notes (Signed)
Subjective:    Patient ID: Collin [name], male    DOB: [birth year], 84 y.o.   MRN: [id number]  HPI The patient is here for follow up.  Diabetes: He is taking his medication daily as prescribed. He is compliant with a diabetic diet. He is exercising regularly.He is up-to-date with an ophthalmology examination.   CAD, Hypertension: He is taking his medication daily. He is compliant with a low sodium diet.  He denies chest pain, palpitations, edema, shortness of breath and regular headaches. He is exercising regularly-rides a stationary bike.     CKD, s/p nephrectomy;  He avoids nsaids.  He has increased his fluids.    Sleeps from 11-12 - 7.  If he sits down in the late morning he will fall asleep.  If he does take a nap it usually is very brief.  He feels his sleep quality is pretty good.    Medications and allergies reviewed with patient and updated if appropriate.  Patient Active Problem List   Diagnosis Date Noted  . Thrombocytopenia ([place])   . Skin cancer   . Pulmonary nodule   . Organic impotence   . Nocturia   . Myocardial infarction ([place])   . Low back pain syndrome   . COPD (chronic obstructive pulmonary disease) ([place])   . BPH (benign prostatic hyperplasia)   . Bladder cancer ([place])   . Arthritis   . Allergy   . Chronic kidney disease (CKD) 07/10/2017  . Diabetes ([place]) 04/15/2017  . Fatty liver 06/18/2016  . Rash and nonspecific skin eruption 06/13/2016  . LUQ fullness 06/13/2016  . Hypertriglyceridemia 04/14/2016  . H/O bacterial endocarditis 04/06/2016  . Myalgia and myositis 12/18/2015  . Abnormal CT of the abdomen 12/31/2013  . Abnormal chest x-ray 12/31/2013  . Gynecomastia   . Hx of cystoscopy 11/02/2012  . Spinal stenosis, lumbar 05/12/2012  . CAD (coronary artery disease)   . Carotid artery disease ([place])   . CERVICAL RADICULOPATHY, LEFT 12/07/2009  . SKIN CANCER, HX OF 12/07/2009  . Renal cell carcinoma ([place]) 07/18/2009  . GERD 07/18/2009  . Essential  hypertension 04/23/2008  . Hyperlipidemia 01/25/2007  . Cancer ([place]) 02/28/2001    Current Outpatient Medications on File Prior to Visit  Medication Sig Dispense Refill  . alfuzosin (UROXATRAL) 10 MG 24 hr tablet Take 10 mg by mouth daily.     . clopidogrel (PLAVIX) 75 MG tablet TAKE 1 TABLET DAILY 90 tablet 1  . finasteride (PROSCAR) 5 MG tablet Take 5 mg by mouth daily.    . Multiple Vitamin (MULTIVITAMIN) tablet Take 1 tablet by mouth daily.      . Probiotic Product (ALIGN) 4 MG CAPS Taking daily    . ranitidine (ZANTAC) 150 MG tablet Take 150 mg by mouth at bedtime as needed for heartburn.     . triamcinolone ointment (KENALOG) 0.1 % Apply 1 application topically 2 (two) times daily.    Marland Kitchen losartan (COZAAR) 50 MG tablet Take 1 tablet (50 mg total) by mouth daily. 90 tablet 3   No current facility-administered medications on file prior to visit.     Past Medical History:  Diagnosis Date  . Allergy   . Anemia   . Arthritis    gout  . Bladder cancer ([place])   . BPH (benign prostatic hyperplasia)   . CAD (coronary artery disease) 2005   DES January 2 005, residual disease  / Myoview  . Cancer ([place]) 02/28/2001   Left ureter cancer/bladder  .  Carcinoma, renal cell ([place])   . Carotid artery disease ([place])    Doppler, September, 2010, normal, no carotid artery disease  . COPD (chronic obstructive pulmonary disease) ([place])   . Dysfunction of eustachian tube   . Ejection fraction    EF 55%, echo, January, 2009  . Encounter for long-term (current) use of other medications   . Enlarged prostate   . GERD (gastroesophageal reflux disease)   . Gynecomastia    b/l  . HTN (hypertension)   . Hx of cystoscopy 11/02/12   negative  . Hx of skin cancer, basal cell   . Hypercholesterolemia   . Hyperlipidemia   . Low back pain syndrome   . Myocardial infarction ([place])    hx acute  . Nocturia   . Organic impotence   . Pulmonary nodule    Chest CT, followed  . Reactive airway disease   .  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 Staph infection 04/1997  . Thrombocytopenia ([place])     Past Surgical History:  Procedure Laterality Date  . BACK SURGERY  02/1996  . bladder tumors  2003, 2004, 01/2009   removed  . CARDIAC CATHETERIZATION    . CHOLECYSTECTOMY  07/2003  . 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 NEPHRECTOMY  02/2001   left kidney, ureter  . osteomyelitis     staph aureus  . URETERECTOMY  2003   for cancer    Social History   Socioeconomic History  . Marital status: Married    Spouse name: Not on file  . Number of children: Not on file  . Years of education: Not on file  . Highest education level: Not on file  Occupational History  . 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 Financial resource strain: Not on file  . Food insecurity:    Worry: Not on file    Inability: Not on file  . Transportation needs:    Medical: Not on file    Non-medical: Not on file  Tobacco Use  . Smoking status: Former Smoker    Types: Pipe    Last attempt to quit: 12/27/1991    Years since quitting: 26.5  . Smokeless tobacco: Former Systems developer  . Tobacco comment: Quit 20-30 years ago as of 2012  Substance and Sexual Activity  . Alcohol use: No    Alcohol/week: 0.0 oz  . Drug use: No  . Sexual activity: Not on file  Lifestyle  . Physical activity:    Days per week: Not on file    Minutes per session: Not on file  . Stress: Not on file  Relationships  . Social connections:    Talks on phone: Not on file    Gets together: Not on file    Attends religious service: Not on file    Active member of club or organization: Not on file    Attends meetings of clubs or organizations: Not on file    Relationship status: Not on file  Other Topics Concern  . Not on file  Social History Narrative   30 minutes a day for 5 days a week    Family History  Problem Relation Age of Onset  .  Heart failure Mother 58  . Diabetes Mother   . Coronary artery disease Brother   . Diabetes Brother   . Drug abuse Brother   .  Diabetes Sister   . Diabetes Sister     Review of Systems  Constitutional: Negative for chills, fatigue and fever.  Respiratory: Positive for shortness of breath (related to exertion). Negative for cough and wheezing.   Cardiovascular: Negative for chest pain, palpitations and leg swelling.  Neurological: Positive for headaches (occasional). Negative for light-headedness.       Objective:   Vitals:   07/09/18 1414  BP: 122/64  Pulse: 73  Resp: 16  Temp: 98.5 F (36.9 C)  SpO2: 98%   BP Readings from Last 3 Encounters:  07/09/18 122/64  01/08/18 138/76  01/02/18 124/64   Wt Readings from Last 3 Encounters:  07/09/18 170 lb (77.1 kg)  01/08/18 165 lb (74.8 kg)  01/02/18 169 lb 12.8 oz (77 kg)   Body mass index is 26.63 kg/m.   Physical Exam    Constitutional: Appears well-developed and well-nourished. No distress.  HENT:  Head: Normocephalic and atraumatic.  Neck: Neck supple. No tracheal deviation present. No thyromegaly present.  No cervical lymphadenopathy Cardiovascular: Normal rate, regular rhythm and normal heart sounds.   No murmur heard. No carotid bruit .  No edema Pulmonary/Chest: Effort normal and breath sounds normal. No respiratory distress. No has no wheezes. No rales.  Skin: Skin is warm and dry. Not diaphoretic.  Psychiatric: Normal mood and affect. Behavior is normal.      Assessment & Plan:    Advised that he is fairly good sleep.  He does have consistent sleep and wake times.  He could try getting more consistently 8 hours of sleep.  Okay to nap during the day as long as it is brief.   See Problem List for Assessment and Plan of chronic medical problems.

## 2018-07-08 NOTE — Patient Instructions (Addendum)
  Test(s) ordered today. Your results will be released to MyChart (or called to you) after review, usually within 72hours after test completion. If any changes need to be made, you will be notified at that same time.  Medications reviewed and updated.  No changes recommended at this time.    Please followup in 6 months   

## 2018-07-09 ENCOUNTER — Encounter: Payer: Self-pay | Admitting: Internal Medicine

## 2018-07-09 ENCOUNTER — Other Ambulatory Visit (INDEPENDENT_AMBULATORY_CARE_PROVIDER_SITE_OTHER): Payer: Medicare Other

## 2018-07-09 ENCOUNTER — Ambulatory Visit (INDEPENDENT_AMBULATORY_CARE_PROVIDER_SITE_OTHER): Payer: Medicare Other | Admitting: Internal Medicine

## 2018-07-09 VITALS — BP 122/64 | HR 73 | Temp 98.5°F | Resp 16 | Wt 170.0 lb

## 2018-07-09 DIAGNOSIS — N189 Chronic kidney disease, unspecified: Secondary | ICD-10-CM | POA: Diagnosis not present

## 2018-07-09 DIAGNOSIS — E119 Type 2 diabetes mellitus without complications: Secondary | ICD-10-CM

## 2018-07-09 DIAGNOSIS — I2510 Atherosclerotic heart disease of native coronary artery without angina pectoris: Secondary | ICD-10-CM

## 2018-07-09 DIAGNOSIS — I10 Essential (primary) hypertension: Secondary | ICD-10-CM

## 2018-07-09 LAB — LIPID PANEL
CHOL/HDL RATIO: 6
Cholesterol: 197 mg/dL (ref 0–200)
HDL: 31.3 mg/dL — AB (ref >39.00)
Triglycerides: 613 mg/dL — ABNORMAL HIGH (ref 0.0–149.0)

## 2018-07-09 LAB — CBC WITH DIFFERENTIAL/PLATELET
BASOS ABS: 0 10*3/uL (ref 0.0–0.1)
BASOS PCT: 0.9 % (ref 0.0–3.0)
EOS ABS: 0.3 10*3/uL (ref 0.0–0.7)
Eosinophils Relative: 5.4 % — ABNORMAL HIGH (ref 0.0–5.0)
HEMATOCRIT: 41 % (ref 39.0–52.0)
HEMOGLOBIN: 14.1 g/dL (ref 13.0–17.0)
Lymphocytes Relative: 27.7 % (ref 12.0–46.0)
Lymphs Abs: 1.4 10*3/uL (ref 0.7–4.0)
MCHC: 34.3 g/dL (ref 30.0–36.0)
MCV: 93.2 fl (ref 78.0–100.0)
Monocytes Absolute: 0.6 10*3/uL (ref 0.1–1.0)
Monocytes Relative: 12 % (ref 3.0–12.0)
Neutro Abs: 2.7 10*3/uL (ref 1.4–7.7)
Neutrophils Relative %: 54 % (ref 43.0–77.0)
Platelets: 127 10*3/uL — ABNORMAL LOW (ref 150.0–400.0)
RBC: 4.4 Mil/uL (ref 4.22–5.81)
RDW: 13.4 % (ref 11.5–15.5)
WBC: 5 10*3/uL (ref 4.0–10.5)

## 2018-07-09 LAB — COMPREHENSIVE METABOLIC PANEL
ALT: 15 U/L (ref 0–53)
AST: 15 U/L (ref 0–37)
Albumin: 4.2 g/dL (ref 3.5–5.2)
Alkaline Phosphatase: 58 U/L (ref 39–117)
BUN: 27 mg/dL — AB (ref 6–23)
CHLORIDE: 103 meq/L (ref 96–112)
CO2: 30 mEq/L (ref 19–32)
Calcium: 9.6 mg/dL (ref 8.4–10.5)
Creatinine, Ser: 1.58 mg/dL — ABNORMAL HIGH (ref 0.40–1.50)
GFR: 43.91 mL/min — AB (ref >60.00)
GLUCOSE: 97 mg/dL (ref 70–99)
POTASSIUM: 5.1 meq/L (ref 3.5–5.1)
SODIUM: 139 meq/L (ref 135–145)
Total Bilirubin: 0.8 mg/dL (ref 0.2–1.2)
Total Protein: 6.6 g/dL (ref 6.0–8.3)

## 2018-07-09 LAB — HEMOGLOBIN A1C: HEMOGLOBIN A1C: 6.2 % (ref 4.6–6.5)

## 2018-07-09 LAB — LDL CHOLESTEROL, DIRECT: Direct LDL: 77 mg/dL

## 2018-07-09 NOTE — Assessment & Plan Note (Addendum)
No chest pain, palpitations, edema or shortness of breath Continue Plavix, losartan CBC, CMP, lipid panel

## 2018-07-09 NOTE — Assessment & Plan Note (Signed)
BP well controlled Current regimen effective and well tolerated Continue current medications at current doses cmp  

## 2018-07-09 NOTE — Assessment & Plan Note (Signed)
He avoids NSAIDs and is trying to drink more fluids Check CMP, CBC

## 2018-07-09 NOTE — Assessment & Plan Note (Signed)
Diet controlled Sugars have been well controlled Check A1c

## 2018-07-13 ENCOUNTER — Encounter: Payer: Self-pay | Admitting: Internal Medicine

## 2018-07-20 ENCOUNTER — Ambulatory Visit: Payer: Medicare Other | Admitting: Podiatry

## 2018-07-23 ENCOUNTER — Other Ambulatory Visit: Payer: Self-pay | Admitting: Cardiovascular Disease

## 2018-07-24 ENCOUNTER — Ambulatory Visit (INDEPENDENT_AMBULATORY_CARE_PROVIDER_SITE_OTHER): Payer: Medicare Other | Admitting: Cardiovascular Disease

## 2018-07-24 ENCOUNTER — Encounter: Payer: Self-pay | Admitting: Cardiovascular Disease

## 2018-07-24 VITALS — BP 134/66 | HR 63 | Ht 66.0 in | Wt 167.1 lb

## 2018-07-24 DIAGNOSIS — I10 Essential (primary) hypertension: Secondary | ICD-10-CM | POA: Diagnosis not present

## 2018-07-24 DIAGNOSIS — I2510 Atherosclerotic heart disease of native coronary artery without angina pectoris: Secondary | ICD-10-CM

## 2018-07-24 NOTE — Progress Notes (Signed)
Cardiology Office Note   Date:  07/24/2018   ID:  Collin [name], DOB 05-28-1925, MRN [id number]  PCP:  Binnie Rail, MD  Cardiologist:  Mertie Moores, MD , previous patient of Dr. Ron Parker  Chief Complaint  Patient presents with  . Coronary Artery Disease   Problem List 1. CAD- s/p stenting in 2005.  2. Essential HTN       Collin [name] is a 84 y.o. male who presents for follow-up of coronary disease.  Had a stent placed by Dr. Olevia Perches in 2005.  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   Goes bowling twice a week Took Atorvastatin for years but stopped due to leg pain .  Last lipids were drawn in Dec.  - showed markedly elevated Trig levels and high chol levels.   April 21, 2017:  Collin [name] has a hx of CAD Hyperlipidemia Was recently diagnosed with DM and He has greatly improved his diet. He's cut back on lots of carbohydrates. He's lost about 5 pounds. He's noticed that his blood pressure has been low. This morning he took only half of his valsartan tablet.  Takes SBE prophylasix - has done so for years.  No prosthetic valve and no artificial joints  Jan. 8, 2019:  Collin [name] is seen back for follow-up visit for his coronary artery disease and diabetes mellitus.   He walks on the treadmill 4-5 times a week for 30 minutes at a time.  He denies any angina while walking the treadmill.  He still bowls once or twice a week.  He notes that his blood pressure has been somewhat variable.  It seems to go up in the evenings.  Eats prepared meals every meal.      July 24, 2018:    Collin [name] is seen today for follow up of his CAD Has pain in his calves with walking -  Resolves with walking  Had a [facility] screening of PAD screening  No CP  Still works out on the treadmill, bowl one a week   Recent lab work shows that his triglyceride level is markedly elevated at 613.  Total  cholesterol is 179.  His LDL is 77. Eats lots of cereal, through the day .       Past Medical History:  Diagnosis Date  . Allergy   . Anemia   . Arthritis    gout  . Bladder cancer ([place])   . BPH (benign prostatic hyperplasia)   . CAD (coronary artery disease) 2005   DES January 2 005, residual disease  / Myoview  . Cancer ([place]) 02/28/2001   Left ureter cancer/bladder  . Carcinoma, renal cell ([place])   . Carotid artery disease ([place])    Doppler, September, 2010, normal, no carotid artery disease  . COPD (chronic obstructive pulmonary disease) ([place])   . Dysfunction of eustachian tube   . Ejection fraction    EF 55%, echo, January, 2009  . Encounter for long-term (current) use of other medications   . Enlarged prostate   . GERD (gastroesophageal reflux disease)   . Gynecomastia    b/l  . HTN (hypertension)   . Hx of cystoscopy 11/02/12   negative  . Hx of skin cancer, basal cell   . Hypercholesterolemia   . Hyperlipidemia   . Low back pain syndrome   . Myocardial  infarction ([place])    hx acute  . Nocturia   . Organic impotence   . Pulmonary nodule    Chest CT, followed  . Reactive airway disease   . 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 Staph infection 04/1997  . Thrombocytopenia ([place])     Past Surgical History:  Procedure Laterality Date  . BACK SURGERY  02/1996  . bladder tumors  2003, 2004, 01/2009   removed  . CARDIAC CATHETERIZATION    . CHOLECYSTECTOMY  07/2003  . 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 NEPHRECTOMY  02/2001   left kidney, ureter  . osteomyelitis     staph aureus  . URETERECTOMY  2003   for cancer    Patient Active Problem List   Diagnosis Date Noted  . CAD (coronary artery disease)     Priority: High  . Essential hypertension 04/23/2008    Priority: High  . Hyperlipidemia 01/25/2007    Priority: High  . Thrombocytopenia ([place])   . Skin cancer   . Pulmonary nodule     . Organic impotence   . Nocturia   . Myocardial infarction ([place])   . Low back pain syndrome   . COPD (chronic obstructive pulmonary disease) ([place])   . BPH (benign prostatic hyperplasia)   . Bladder cancer ([place])   . Arthritis   . Allergy   . Chronic kidney disease (CKD) 07/10/2017  . Diabetes ([place]) 04/15/2017  . Fatty liver 06/18/2016  . Rash and nonspecific skin eruption 06/13/2016  . LUQ fullness 06/13/2016  . Hypertriglyceridemia 04/14/2016  . H/O bacterial endocarditis 04/06/2016  . Myalgia and myositis 12/18/2015  . Abnormal CT of the abdomen 12/31/2013  . Abnormal chest x-ray 12/31/2013  . Gynecomastia   . Hx of cystoscopy 11/02/2012  . Spinal stenosis, lumbar 05/12/2012  . Carotid artery disease ([place])   . CERVICAL RADICULOPATHY, LEFT 12/07/2009  . SKIN CANCER, HX OF 12/07/2009  . Renal cell carcinoma ([place]) 07/18/2009  . GERD 07/18/2009  . Cancer ([place]) 02/28/2001      Current Outpatient Medications  Medication Sig Dispense Refill  . alfuzosin (UROXATRAL) 10 MG 24 hr tablet Take 10 mg by mouth daily.     . clopidogrel (PLAVIX) 75 MG tablet TAKE 1 TABLET DAILY 90 tablet 1  . finasteride (PROSCAR) 5 MG tablet Take 5 mg by mouth daily.    Marland Kitchen losartan (COZAAR) 50 MG tablet TAKE 1 TABLET DAILY 90 tablet 1  . Multiple Vitamin (MULTIVITAMIN) tablet Take 1 tablet by mouth daily.      . Probiotic Product (ALIGN) 4 MG CAPS Taking daily    . ranitidine (ZANTAC) 150 MG tablet Take 150 mg by mouth at bedtime as needed for heartburn.     . triamcinolone ointment (KENALOG) 0.1 % Apply 1 application topically 2 (two) times daily.     No current facility-administered medications for this visit.     Allergies:   Atorvastatin; Peanut oil; and Trandolapril    Social History:  The patient  reports that he quit smoking about 26 years ago. His smoking use included pipe. He has quit using smokeless tobacco. He reports that he does not drink alcohol or use drugs.   Family History:  The  patient's family history includes Coronary artery disease in his brother; Diabetes in his brother, mother, sister, and sister; Drug abuse in his brother; Heart failure (age of onset: 46)  in his mother.    ROS:  Please see the history of present illness.  Patient denies fever, chills, headache, sweats, rash, change in vision, change in hearing, chest pain, cough, nausea or vomiting, urinary symptoms. All other systems are reviewed and are negative.     Physical Exam: Blood pressure 134/66, pulse 63, height 5\' 6"  (1.676 m), weight 167 lb 1.9 oz (75.8 kg), SpO2 94 %.  GEN:  Well nourished, well developed in no acute distress HEENT: Normal NECK: No JVD; No carotid bruits LYMPHATICS: No lymphadenopathy CARDIAC: RRR , no murmurs, rubs, gallops RESPIRATORY:  Clear to auscultation without rales, wheezing or rhonchi  ABDOMEN: Soft, non-tender, non-distended MUSCULOSKELETAL:  No edema; No deformity  SKIN: Warm and dry NEUROLOGIC:  Alert and oriented x 3   EKG:     Recent Labs: 07/09/2018: ALT 15; BUN 27; Creatinine, Ser 1.58; Hemoglobin 14.1; Platelets 127.0 Repeated and verified X2.; Potassium 5.1; Sodium 139    Lipid Panel    Component Value Date/Time   CHOL 197 07/09/2018 1449   TRIG (H) 07/09/2018 1449    613.0 Triglyceride is over 400; calculations on Lipids are invalid.   HDL 31.30 (L) 07/09/2018 1449   CHOLHDL 6 07/09/2018 1449   VLDL 78.6 (H) 01/08/2018 1409   LDLCALC 49 02/02/2015 0841   LDLDIRECT 77.0 07/09/2018 1449      Wt Readings from Last 3 Encounters:  07/24/18 167 lb 1.9 oz (75.8 kg)  07/09/18 170 lb (77.1 kg)  01/08/18 165 lb (74.8 kg)      Current medicines are reviewed. The patient has a very good understanding of his medications. No changes are to be made.       ASSESSMENT AND PLAN:  1. CAD- s/p stenting in 2005.  - doing well. No angina    2. Essential HTN-BP .  BP is stable. .      3. Hyperlipidemia -   Lipids / trigs are very elevated.    is  working on his diet .  He admits to eating a lot of cereal and carbohydrates.   4.  Possible claudication: He complains of having some leg pain with walking.  He had a peripheral arterial disease screening test about 6 months ago.  He will forward these results to [place].  If the claudication symptoms worsen, will repeat the study on an official basis in our [place] lab.    Mertie Moores, MD  07/24/2018 9:32 AM    [address],  [address], [place]  [address] Pager [phone] Phone: [phone]; Fax: [phone]

## 2018-07-24 NOTE — Patient Instructions (Signed)

## 2018-07-27 ENCOUNTER — Encounter: Payer: Self-pay | Admitting: Podiatry

## 2018-07-27 ENCOUNTER — Ambulatory Visit (INDEPENDENT_AMBULATORY_CARE_PROVIDER_SITE_OTHER): Payer: Medicare Other | Admitting: Podiatry

## 2018-07-27 DIAGNOSIS — M79674 Pain in right toe(s): Secondary | ICD-10-CM

## 2018-07-27 DIAGNOSIS — M79675 Pain in left toe(s): Secondary | ICD-10-CM

## 2018-07-27 DIAGNOSIS — B351 Tinea unguium: Secondary | ICD-10-CM

## 2018-08-13 NOTE — Progress Notes (Signed)
Patient left the office before being seen.

## 2018-10-01 DIAGNOSIS — H5211 Myopia, right eye: Secondary | ICD-10-CM | POA: Diagnosis not present

## 2018-10-01 DIAGNOSIS — Z961 Presence of intraocular lens: Secondary | ICD-10-CM | POA: Diagnosis not present

## 2018-10-01 DIAGNOSIS — H5202 Hypermetropia, left eye: Secondary | ICD-10-CM | POA: Diagnosis not present

## 2018-10-01 DIAGNOSIS — H524 Presbyopia: Secondary | ICD-10-CM | POA: Diagnosis not present

## 2018-10-01 DIAGNOSIS — Z947 Corneal transplant status: Secondary | ICD-10-CM | POA: Diagnosis not present

## 2018-10-01 LAB — HM DIABETES EYE EXAM

## 2018-10-05 NOTE — Progress Notes (Addendum)
Subjective:   Collin [name] is a 84 y.o. male who presents for Medicare Annual/Subsequent preventive examination.  Review of Systems:  No ROS.  Medicare Wellness Visit. Additional risk factors are reflected in the social history.    Sleep patterns: gets up 2-3 times nightly to void and sleeps 6 hours nightly.    Home Safety/Smoke Alarms: Feels safe in home. Smoke alarms in place.  Living environment; residence and Firearm Safety: 1-story house/ trailer, no firearms. Lives alone, no needs for DME, good support system Seat Belt Safety/Bike Helmet: Wears seat belt.     Objective:    Vitals: There were no vitals taken for this visit.  There is no height or weight on file to calculate BMI.  Advanced Directives 10/04/2017 08/06/2016 04/06/2016  Does Patient Have a Medical Advance Directive? Yes No Yes  Type of Paramedic of [place];Living will - -  Copy of [place] in Chart? No - copy requested - -  Would patient like information on creating a medical advance directive? - No - patient declined information -    Tobacco Social History   Tobacco Use  Smoking Status Former Smoker  . Types: Pipe  . Last attempt to quit: 12/27/1991  . Years since quitting: 26.7  Smokeless Tobacco Former Systems developer  Tobacco Comment   Quit 20-30 years ago as of 2012     Counseling given: Not Answered Comment: Quit 20-30 years ago as of 2012  Past Medical History:  Diagnosis Date  . Allergy   . Anemia   . Arthritis    gout  . Bladder cancer ([place])   . BPH (benign prostatic hyperplasia)   . CAD (coronary artery disease) 2005   DES January 2 005, residual disease  / Myoview  . Cancer ([place]) 02/28/2001   Left ureter cancer/bladder  . Carcinoma, renal cell ([place])   . Carotid artery disease ([place])    Doppler, September, 2010, normal, no carotid artery disease  . COPD (chronic obstructive pulmonary disease) ([place])   . Dysfunction of eustachian tube   . Ejection  fraction    EF 55%, echo, January, 2009  . Encounter for long-term (current) use of other medications   . Enlarged prostate   . GERD (gastroesophageal reflux disease)   . Gynecomastia    b/l  . HTN (hypertension)   . Hx of cystoscopy 11/02/12   negative  . Hx of skin cancer, basal cell   . Hypercholesterolemia   . Hyperlipidemia   . Low back pain syndrome   . Myocardial infarction ([place])    hx acute  . Nocturia   . Organic impotence   . Pulmonary nodule    Chest CT, followed  . Reactive airway disease   . 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 Staph infection 04/1997  . Thrombocytopenia ([place])    Past Surgical History:  Procedure Laterality Date  . BACK SURGERY  02/1996  . bladder tumors  2003, 2004, 01/2009   removed  . CARDIAC CATHETERIZATION    . CHOLECYSTECTOMY  07/2003  . 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 NEPHRECTOMY  02/2001   left kidney, ureter  . osteomyelitis     staph aureus  . URETERECTOMY  2003   for cancer   Family History  Problem Relation Age of Onset  . Heart failure Mother 10  . Diabetes Mother   .  Coronary artery disease Brother   . Diabetes Brother   . Drug abuse Brother   . Diabetes Sister   . Diabetes Sister    Social History   Socioeconomic History  . Marital status: Widowed    Spouse name: Not on file  . Number of children: Not on file  . Years of education: Not on file  . Highest education level: Not on file  Occupational History  . 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 Financial resource strain: Not on file  . Food insecurity:    Worry: Not on file    Inability: Not on file  . Transportation needs:    Medical: Not on file    Non-medical: Not on file  Tobacco Use  . Smoking status: Former Smoker    Types: Pipe    Last attempt to quit: 12/27/1991    Years since quitting: 26.7  . Smokeless tobacco: Former Systems developer  . Tobacco  comment: Quit 20-30 years ago as of 2012  Substance and Sexual Activity  . Alcohol use: No    Alcohol/week: 0.0 standard drinks  . Drug use: No  . Sexual activity: Not on file  Lifestyle  . Physical activity:    Days per week: Not on file    Minutes per session: Not on file  . Stress: Not on file  Relationships  . Social connections:    Talks on phone: Not on file    Gets together: Not on file    Attends religious service: Not on file    Active member of club or organization: Not on file    Attends meetings of clubs or organizations: Not on file    Relationship status: Not on file  Other Topics Concern  . Not on file  Social History Narrative   30 minutes a day for 5 days a week    Outpatient Encounter Medications as of 10/08/2018  Medication Sig  . alfuzosin (UROXATRAL) 10 MG 24 hr tablet Take 10 mg by mouth daily.   . clopidogrel (PLAVIX) 75 MG tablet TAKE 1 TABLET DAILY  . finasteride (PROSCAR) 5 MG tablet Take 5 mg by mouth daily.  Marland Kitchen losartan (COZAAR) 50 MG tablet TAKE 1 TABLET DAILY  . Multiple Vitamin (MULTIVITAMIN) tablet Take 1 tablet by mouth daily.    . Probiotic Product (ALIGN) 4 MG CAPS Taking daily  . ranitidine (ZANTAC) 150 MG tablet Take 150 mg by mouth at bedtime as needed for heartburn.   . triamcinolone ointment (KENALOG) 0.1 % Apply 1 application topically 2 (two) times daily.   No facility-administered encounter medications on file as of 10/08/2018.     Activities of Daily Living No flowsheet data found.  Patient Care Team: Binnie Rail, MD as PCP - General (Internal Medicine) Nahser, Wonda Cheng, MD as PCP - Cardiology (Cardiology)   Assessment:   This is a routine wellness examination for [name]. Physical assessment deferred to PCP.   Exercise Activities and Dietary recommendations   Diet (meal preparation, eat out, water intake, caffeinated beverages, dairy products, fruits and vegetables): in general, a "healthy" diet     Reviewed heart  healthy and diabetic diet. Encouraged patient to increase daily water and healthy fluid intake.     Goals      Patient Stated   . patient (pt-stated)     Find healthy snacks;        Other   . lose weight  Monitor diet for carbohydrates, increase protein, continue to walk on  treadmill, worship God, and enjoy life.       Fall Risk Fall Risk  10/04/2017 07/17/2017 04/06/2016 03/19/2014  Falls in the past year? No No No No  Comment - Emmi Telephone Survey: data to providers prior to load - -    Depression Screen PHQ 2/9 Scores 10/04/2017 04/06/2016 03/19/2014  PHQ - 2 Score 1 0 0  PHQ- 9 Score 1 - -    Cognitive Function MMSE - Mini Mental State Exam 10/04/2017 04/06/2016  Not completed: - (No Data)  Orientation to time 5 -  Orientation to Place 5 -  Registration 3 -  Attention/ Calculation 5 -  Recall 3 -  Language- name 2 objects 2 -  Language- repeat 1 -  Language- follow 3 step command 3 -  Language- read & follow direction 1 -  Write a sentence 1 -  Copy design 1 -  Total score 30 -        Immunization History  Administered Date(s) Administered  . Influenza Whole 09/17/2008, 07/26/2012, 09/10/2013  . Influenza, High Dose Seasonal PF 08/29/2014, 10/07/2016  . Influenza-Unspecified 08/31/2015, 09/21/2017  . Pneumococcal Conjugate-13 11/07/2016  . Pneumococcal-Unspecified 12/26/1996  . Tdap 07/17/2017  . Zoster Recombinat (Shingrix) 10/05/2017, 12/11/2017     Screening Tests Health Maintenance  Topic Date Due  . INFLUENZA VACCINE  07/26/2018  . OPHTHALMOLOGY EXAM  09/18/2018  . FOOT EXAM  04/21/2019  . TETANUS/TDAP  07/18/2027  . PNA vac Low Risk Adult  Completed      Plan:    Continue doing brain stimulating activities (puzzles, reading, adult coloring books, staying active) to keep memory sharp.   Continue to eat heart healthy diet (full of fruits, vegetables, whole grains, lean protein, water--limit salt, fat, and sugar intake) and increase  physical activity as tolerated.  I have personally reviewed and noted the following in the patient's chart:   . Medical and social history . Use of alcohol, tobacco or illicit drugs  . Current medications and supplements . Functional ability and status . Nutritional status . Physical activity . Advanced directives . List of other physicians . Vitals . Screenings to include cognitive, depression, and falls . Referrals and appointments  In addition, I have reviewed and discussed with patient certain preventive protocols, quality metrics, and best practice recommendations. A written personalized care plan for preventive services as well as general preventive health recommendations were provided to patient.     Michiel Cowboy, RN  10/05/2018   Medical screening examination/treatment/procedure(s) were performed by non-physician practitioner and as supervising physician I was immediately available for consultation/collaboration. I agree with above. Binnie Rail, MD

## 2018-10-08 ENCOUNTER — Telehealth: Payer: Self-pay | Admitting: *Deleted

## 2018-10-08 ENCOUNTER — Ambulatory Visit (INDEPENDENT_AMBULATORY_CARE_PROVIDER_SITE_OTHER): Payer: Medicare Other | Admitting: *Deleted

## 2018-10-08 VITALS — BP 143/62 | HR 69 | Resp 17 | Ht 66.0 in | Wt 166.0 lb

## 2018-10-08 DIAGNOSIS — Z0000 Encounter for general adult medical examination without abnormal findings: Secondary | ICD-10-CM | POA: Diagnosis not present

## 2018-10-08 DIAGNOSIS — E781 Pure hyperglyceridemia: Secondary | ICD-10-CM

## 2018-10-08 NOTE — Telephone Encounter (Signed)
Will start low dose - we can increase in future if needed.  rx pending - not sure if he wants it sent to a local pharmacy ?   Recheck lipids in 6 weeks - order pending.

## 2018-10-08 NOTE — Telephone Encounter (Signed)
During AWV, patient stated that he would like to begin medication to treat high triglycerides. He lost the Mychart message PCP sent previously and did not respond at that time. PCP sent message 07/13/18 to begin fenofibrate.

## 2018-10-08 NOTE — Patient Instructions (Addendum)
Continue doing brain stimulating activities (puzzles, reading, adult coloring books, staying active) to keep memory sharp.   Continue to eat heart healthy diet (full of fruits, vegetables, whole grains, lean protein, water--limit salt, fat, and sugar intake) and increase physical activity as tolerated.   Collin [name] , Thank you for taking time to come for your Medicare Wellness Visit. I appreciate your ongoing commitment to your health goals. Please review the following plan we discussed and let me know if I can assist you in the future.   These are the goals we discussed: Goals      Patient Stated   . patient (pt-stated)     Find healthy snacks;        Other   . lose weight     Monitor diet for carbohydrates, increase protein, continue to walk on  treadmill, worship God, and enjoy life.    . Patient Stated     Maintain current health status. Continue to enjoy church and bowling.        This is a list of the screening recommended for you and due dates:  Health Maintenance  Topic Date Due  . Flu Shot  07/26/2018  . Eye exam for diabetics  09/18/2018  . Complete foot exam   04/21/2019  . Tetanus Vaccine  07/18/2027  . Pneumonia vaccines  Completed     Health Maintenance, Male A healthy lifestyle and preventive care is important for your health and wellness. Ask your health care provider about what schedule of regular examinations is right for you. What should I know about weight and diet? Eat a Healthy Diet  Eat plenty of vegetables, fruits, whole grains, low-fat dairy products, and lean protein.  Do not eat a lot of foods high in solid fats, added sugars, or salt.  Maintain a Healthy Weight Regular exercise can help you achieve or maintain a healthy weight. You should:  Do at least 150 minutes of exercise each week. The exercise should increase your heart rate and make you sweat (moderate-intensity exercise).  Do strength-training exercises at least twice a  week.  Watch Your Levels of Cholesterol and Blood Lipids  Have your blood tested for lipids and cholesterol every 5 years starting at 82 years of age. If you are at high risk for heart disease, you should start having your blood tested when you are 82 years old. You may need to have your cholesterol levels checked more often if: ? Your lipid or cholesterol levels are high. ? You are older than 82 years of age. ? You are at high risk for heart disease.  What should I know about cancer screening? Many types of cancers can be detected early and may often be prevented. Lung Cancer  You should be screened every year for lung cancer if: ? You are a current smoker who has smoked for at least 30 years. ? You are a former smoker who has quit within the past 15 years.  Talk to your health care provider about your screening options, when you should start screening, and how often you should be screened.  Colorectal Cancer  Routine colorectal cancer screening usually begins at 82 years of age and should be repeated every 5-10 years until you are 82 years old. You may need to be screened more often if early forms of precancerous polyps or small growths are found. Your health care provider may recommend screening at an earlier age if you have risk factors for colon cancer.  Your  health care provider may recommend using home test kits to check for hidden blood in the stool.  A small camera at the end of a tube can be used to examine your colon (sigmoidoscopy or colonoscopy). This checks for the earliest forms of colorectal cancer.  Prostate and Testicular Cancer  Depending on your age and overall health, your health care provider may do certain tests to screen for prostate and testicular cancer.  Talk to your health care provider about any symptoms or concerns you have about testicular or prostate cancer.  Skin Cancer  Check your skin from head to toe regularly.  Tell your health care provider  about any new moles or changes in moles, especially if: ? There is a change in a mole's size, shape, or color. ? You have a mole that is larger than a pencil eraser.  Always use sunscreen. Apply sunscreen liberally and repeat throughout the day.  Protect yourself by wearing long sleeves, pants, a wide-brimmed hat, and sunglasses when outside.  What should I know about heart disease, diabetes, and high blood pressure?  If you are 83-47 years of age, have your blood pressure checked every 3-5 years. If you are 41 years of age or older, have your blood pressure checked every year. You should have your blood pressure measured twice-once when you are at a hospital or clinic, and once when you are not at a hospital or clinic. Record the average of the two measurements. To check your blood pressure when you are not at a hospital or clinic, you can use: ? An automated blood pressure machine at a pharmacy. ? A home blood pressure monitor.  Talk to your health care provider about your target blood pressure.  If you are between 74-81 years old, ask your health care provider if you should take aspirin to prevent heart disease.  Have regular diabetes screenings by checking your fasting blood sugar level. ? If you are at a normal weight and have a low risk for diabetes, have this test once every three years after the age of 23. ? If you are overweight and have a high risk for diabetes, consider being tested at a younger age or more often.  A one-time screening for abdominal aortic aneurysm (AAA) by ultrasound is recommended for men aged 80-75 years who are current or former smokers. What should I know about preventing infection? Hepatitis B If you have a higher risk for hepatitis B, you should be screened for this virus. Talk with your health care provider to find out if you are at risk for hepatitis B infection. Hepatitis C Blood testing is recommended for:  Everyone born from [birth year] through  1963.  Anyone with known risk factors for hepatitis C.  Sexually Transmitted Diseases (STDs)  You should be screened each year for STDs including gonorrhea and chlamydia if: ? You are sexually active and are younger than 82 years of age. ? You are older than 82 years of age and your health care provider tells you that you are at risk for this type of infection. ? Your sexual activity has changed since you were last screened and you are at an increased risk for chlamydia or gonorrhea. Ask your health care provider if you are at risk.  Talk with your health care provider about whether you are at high risk of being infected with HIV. Your health care provider may recommend a prescription medicine to help prevent HIV infection.  What else can I do?  Schedule regular health, dental, and eye exams.  Stay current with your vaccines (immunizations).  Do not use any tobacco products, such as cigarettes, chewing tobacco, and e-cigarettes. If you need help quitting, ask your health care provider.  Limit alcohol intake to no more than 2 drinks per day. One drink equals 12 ounces of beer, 5 ounces of Helia Haese, or 1 ounces of hard liquor.  Do not use street drugs.  Do not share needles.  Ask your health care provider for help if you need support or information about quitting drugs.  Tell your health care provider if you often feel depressed.  Tell your health care provider if you have ever been abused or do not feel safe at home. This information is not intended to replace advice given to you by your health care provider. Make sure you discuss any questions you have with your health care provider. Document Released: 06/09/2008 Document Revised: 08/10/2016 Document Reviewed: 09/15/2015 Elsevier Interactive Patient Education  [employer].

## 2018-10-09 MED ORDER — FENOFIBRATE 48 MG PO TABS: 48.0000 mg | ORAL_TABLET | Freq: Every day | ORAL | 5 refills | Status: DC

## 2018-10-09 NOTE — Telephone Encounter (Signed)
Rx sent to mail order per pts request. Aware to come back in 6 weeks for repeat blood work.

## 2018-10-11 ENCOUNTER — Encounter: Payer: Self-pay | Admitting: Internal Medicine

## 2018-11-12 ENCOUNTER — Other Ambulatory Visit: Payer: Self-pay | Admitting: Cardiovascular Disease

## 2018-11-25 ENCOUNTER — Encounter: Payer: Self-pay | Admitting: Internal Medicine

## 2018-11-28 ENCOUNTER — Encounter: Payer: Self-pay | Admitting: Internal Medicine

## 2018-11-28 ENCOUNTER — Other Ambulatory Visit (INDEPENDENT_AMBULATORY_CARE_PROVIDER_SITE_OTHER): Payer: Medicare Other

## 2018-11-28 DIAGNOSIS — E781 Pure hyperglyceridemia: Secondary | ICD-10-CM

## 2018-11-28 LAB — HEPATIC FUNCTION PANEL
ALK PHOS: 51 U/L (ref 39–117)
ALT: 18 U/L (ref 0–53)
AST: 17 U/L (ref 0–37)
Albumin: 4.5 g/dL (ref 3.5–5.2)
BILIRUBIN TOTAL: 0.7 mg/dL (ref 0.2–1.2)
Bilirubin, Direct: 0.1 mg/dL (ref 0.0–0.3)
Total Protein: 6.7 g/dL (ref 6.0–8.3)

## 2018-11-28 LAB — LIPID PANEL
CHOL/HDL RATIO: 6
Cholesterol: 214 mg/dL — ABNORMAL HIGH (ref 0–200)
HDL: 37.7 mg/dL — AB (ref >39.00)
NonHDL: 176.34
Triglycerides: 314 mg/dL — ABNORMAL HIGH (ref 0.0–149.0)
VLDL: 62.8 mg/dL — ABNORMAL HIGH (ref 0.0–40.0)

## 2018-11-28 LAB — LDL CHOLESTEROL, DIRECT: Direct LDL: 110 mg/dL

## 2018-12-03 DIAGNOSIS — R3915 Urgency of urination: Secondary | ICD-10-CM | POA: Diagnosis not present

## 2018-12-03 DIAGNOSIS — N5201 Erectile dysfunction due to arterial insufficiency: Secondary | ICD-10-CM | POA: Diagnosis not present

## 2018-12-03 DIAGNOSIS — N401 Enlarged prostate with lower urinary tract symptoms: Secondary | ICD-10-CM | POA: Diagnosis not present

## 2018-12-03 DIAGNOSIS — Z8551 Personal history of malignant neoplasm of bladder: Secondary | ICD-10-CM | POA: Diagnosis not present

## 2019-01-06 NOTE — Patient Instructions (Addendum)
Have blood work and an Insurance account manager today.  Tests ordered today. Your results will be released to [facility] (or called to you) after review, usually within 72hours after test completion. If any changes need to be made, you will be notified at that same time.  An EKG was done today.    An Echocardiogram was ordered.    Medications reviewed and updated.  Changes include :   none   Please followup in 6 months

## 2019-01-06 NOTE — Progress Notes (Signed)
Subjective:    Patient ID: Collin [name], male    DOB: May 20, 1925, 85 y.o.   MRN: [id number]  HPI The patient is here for follow up.  Chest pain:  Just after christmas he was lifting a box over his head he felt a pop in his chest but no pain.  He gradually had soreness in the center of his chest and it is sore all the way across his chest.  The soreness is constant.  It is worse with deep breathes.  He feels a little more SOB since that happened.  It feels tight.  It has not gotten better.  He did try rolaids and antacid and it helped a little.  He denies any reflux.  He denies any abdominal pain.  Diabetes: He is taking his medication daily as prescribed. He is compliant with a diabetic diet. He is exercising regularly walks on a treadmill.  He checks his feet daily and denies foot lesions. He is up-to-date with an ophthalmology examination.   CKD, s/p nephrectomy:  He does not take any nsaids.  He tries to drink a lot of water.    CAD, Hypertension: He is taking his medication daily. He is compliant with a low sodium diet.  He denies  palpitations, edema and regular headaches. He is exercising regularly.      Medications and allergies reviewed with patient and updated if appropriate.  Patient Active Problem List   Diagnosis Date Noted  . Thrombocytopenia ([place])   . Skin cancer   . Pulmonary nodule   . Organic impotence   . Nocturia   . Myocardial infarction ([place])   . Low back pain syndrome   . COPD (chronic obstructive pulmonary disease) ([place])   . BPH (benign prostatic hyperplasia)   . Bladder cancer ([place])   . Arthritis   . Chronic kidney disease (CKD) 07/10/2017  . Diabetes ([place]) 04/15/2017  . Fatty liver 06/18/2016  . Rash and nonspecific skin eruption 06/13/2016  . LUQ fullness 06/13/2016  . H/O bacterial endocarditis 04/06/2016  . Myalgia and myositis 12/18/2015  . Abnormal CT of the abdomen 12/31/2013  . Abnormal chest x-ray 12/31/2013  . Gynecomastia   . Spinal  stenosis, lumbar 05/12/2012  . CAD (coronary artery disease)   . Carotid artery disease ([place])   . CERVICAL RADICULOPATHY, LEFT 12/07/2009  . SKIN CANCER, HX OF 12/07/2009  . Renal cell carcinoma ([place]) 07/18/2009  . GERD 07/18/2009  . Essential hypertension 04/23/2008  . Dyslipidemia 01/25/2007  . Cancer ([place]) 02/28/2001    Current Outpatient Medications on File Prior to Visit  Medication Sig Dispense Refill  . acetaminophen (TYLENOL) 500 MG tablet Take 500 mg by mouth every 6 (six) hours as needed.    Marland Kitchen alfuzosin (UROXATRAL) 10 MG 24 hr tablet Take 10 mg by mouth daily.     . clopidogrel (PLAVIX) 75 MG tablet Take 1 tablet (75 mg total) by mouth daily. 90 tablet 2  . fenofibrate (TRICOR) 48 MG tablet Take 1 tablet (48 mg total) by mouth daily. 30 tablet 5  . finasteride (PROSCAR) 5 MG tablet Take 5 mg by mouth daily.    Marland Kitchen losartan (COZAAR) 50 MG tablet TAKE 1 TABLET DAILY 90 tablet 1  . Multiple Vitamin (MULTIVITAMIN) tablet Take 1 tablet by mouth daily.      . Probiotic Product (ALIGN) 4 MG CAPS Taking daily    . triamcinolone ointment (KENALOG) 0.1 % Apply 1 application topically 2 (two) times daily.  No current facility-administered medications on file prior to visit.     Past Medical History:  Diagnosis Date  . Allergy   . Anemia   . Arthritis    gout  . Bladder cancer ([place])   . BPH (benign prostatic hyperplasia)   . CAD (coronary artery disease) 2005   DES January 2 005, residual disease  / Myoview  . Cancer ([place]) 02/28/2001   Left ureter cancer/bladder  . Carcinoma, renal cell ([place])   . Carotid artery disease ([place])    Doppler, September, 2010, normal, no carotid artery disease  . COPD (chronic obstructive pulmonary disease) ([place])   . Dysfunction of eustachian tube   . Ejection fraction    EF 55%, echo, January, 2009  . Encounter for long-term (current) use of other medications   . Enlarged prostate   . GERD (gastroesophageal reflux disease)   . Gynecomastia     b/l  . HTN (hypertension)   . Hx of cystoscopy 11/02/12   negative  . Hx of skin cancer, basal cell   . Hypercholesterolemia   . Hyperlipidemia   . Low back pain syndrome   . Myocardial infarction ([place])    hx acute  . Nocturia   . Organic impotence   . Pulmonary nodule    Chest CT, followed  . Reactive airway disease   . 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 Staph infection 04/1997  . Thrombocytopenia ([place])     Past Surgical History:  Procedure Laterality Date  . BACK SURGERY  02/1996  . bladder tumors  2003, 2004, 01/2009   removed  . CARDIAC CATHETERIZATION    . CHOLECYSTECTOMY  07/2003  . 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 NEPHRECTOMY  02/2001   left kidney, ureter  . osteomyelitis     staph aureus  . URETERECTOMY  2003   for cancer    Social History   Socioeconomic History  . Marital status: Widowed    Spouse name: Not on file  . Number of children: Not on file  . Years of education: Not on file  . Highest education level: Not on file  Occupational History  . 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 Financial resource strain: Not hard at all  . Food insecurity:    Worry: Never true    Inability: Never true  . Transportation needs:    Medical: No    Non-medical: No  Tobacco Use  . Smoking status: Former Smoker    Types: Pipe    Last attempt to quit: 12/27/1991    Years since quitting: 27.0  . Smokeless tobacco: Former Systems developer  . Tobacco comment: Quit 20-30 years ago as of 2012  Substance and Sexual Activity  . Alcohol use: No    Alcohol/week: 0.0 standard drinks  . Drug use: No  . Sexual activity: Not Currently  Lifestyle  . Physical activity:    Days per week: 5 days    Minutes per session: 30 min  . Stress: Not at all  Relationships  . Social connections:    Talks on phone: More than three times a week    Gets together: More than three times a week     Attends religious service: More than 4 times per year    Active member of club or organization: Yes    Attends meetings of clubs  or organizations: More than 4 times per year    Relationship status: Widowed  Other Topics Concern  . Not on file  Social History Narrative   30 minutes a day for 5 days a week    Family History  Problem Relation Age of Onset  . Heart failure Mother 94  . Diabetes Mother   . Coronary artery disease Brother   . Diabetes Brother   . Drug abuse Brother   . Diabetes Sister   . Diabetes Sister     Review of Systems  Constitutional: Negative for chills and fever.  HENT: Negative for sore throat, trouble swallowing and voice change.   Respiratory: Positive for shortness of breath (with exertion and even at rest or taking deep breath - new since he lifted the box). Negative for cough and wheezing.   Cardiovascular: Positive for chest pain. Negative for palpitations and leg swelling.  Neurological: Positive for headaches (occasional). Negative for light-headedness.       Objective:   Vitals:   01/07/19 1345  BP: 122/64  Pulse: 65  Resp: 16  Temp: 99 F (37.2 C)  SpO2: 95%   BP Readings from Last 3 Encounters:  01/07/19 122/64  10/08/18 (!) 143/62  07/24/18 134/66   Wt Readings from Last 3 Encounters:  01/07/19 164 lb (74.4 kg)  10/08/18 166 lb (75.3 kg)  07/24/18 167 lb 1.9 oz (75.8 kg)   Body mass index is 26.47 kg/m.   Physical Exam    Constitutional: Appears well-developed and well-nourished. No distress.  HENT:  Head: Normocephalic and atraumatic.  Neck: Neck supple. No tracheal deviation present. No thyromegaly present.  No cervical lymphadenopathy Cardiovascular: Normal rate, regular rhythm and normal heart sounds.  No murmur heard. No carotid bruit .  No edema Pulmonary/Chest: chest non tender with palpation, Effort normal and breath sounds normal. No respiratory distress. No has no wheezes. No rales.  Skin: Skin is warm and  dry. Not diaphoretic.  Psychiatric: Normal mood and affect. Behavior is normal.      Assessment & Plan:    See Problem List for Assessment and Plan of chronic medical problems.

## 2019-01-07 ENCOUNTER — Encounter: Payer: Self-pay | Admitting: Internal Medicine

## 2019-01-07 ENCOUNTER — Other Ambulatory Visit (INDEPENDENT_AMBULATORY_CARE_PROVIDER_SITE_OTHER): Payer: Medicare Other

## 2019-01-07 ENCOUNTER — Ambulatory Visit (INDEPENDENT_AMBULATORY_CARE_PROVIDER_SITE_OTHER): Payer: Medicare Other | Admitting: Internal Medicine

## 2019-01-07 VITALS — BP 122/64 | HR 65 | Temp 99.0°F | Resp 16 | Ht 66.0 in | Wt 164.0 lb

## 2019-01-07 DIAGNOSIS — D696 Thrombocytopenia, unspecified: Secondary | ICD-10-CM

## 2019-01-07 DIAGNOSIS — E119 Type 2 diabetes mellitus without complications: Secondary | ICD-10-CM

## 2019-01-07 DIAGNOSIS — I2510 Atherosclerotic heart disease of native coronary artery without angina pectoris: Secondary | ICD-10-CM

## 2019-01-07 DIAGNOSIS — N189 Chronic kidney disease, unspecified: Secondary | ICD-10-CM

## 2019-01-07 DIAGNOSIS — R0789 Other chest pain: Secondary | ICD-10-CM | POA: Insufficient documentation

## 2019-01-07 DIAGNOSIS — I10 Essential (primary) hypertension: Secondary | ICD-10-CM

## 2019-01-07 DIAGNOSIS — E785 Hyperlipidemia, unspecified: Secondary | ICD-10-CM

## 2019-01-07 LAB — CBC WITH DIFFERENTIAL/PLATELET
Basophils Absolute: 0 10*3/uL (ref 0.0–0.1)
Basophils Relative: 0.9 % (ref 0.0–3.0)
Eosinophils Absolute: 0.2 10*3/uL (ref 0.0–0.7)
Eosinophils Relative: 4.3 % (ref 0.0–5.0)
HCT: 42 % (ref 39.0–52.0)
Hemoglobin: 14.2 g/dL (ref 13.0–17.0)
LYMPHS PCT: 29 % (ref 12.0–46.0)
Lymphs Abs: 1.3 10*3/uL (ref 0.7–4.0)
MCHC: 33.9 g/dL (ref 30.0–36.0)
MCV: 92.2 fl (ref 78.0–100.0)
Monocytes Absolute: 0.5 10*3/uL (ref 0.1–1.0)
Monocytes Relative: 10.9 % (ref 3.0–12.0)
Neutro Abs: 2.5 10*3/uL (ref 1.4–7.7)
Neutrophils Relative %: 54.9 % (ref 43.0–77.0)
Platelets: 127 10*3/uL — ABNORMAL LOW (ref 150.0–400.0)
RBC: 4.55 Mil/uL (ref 4.22–5.81)
RDW: 13.3 % (ref 11.5–15.5)
WBC: 4.6 10*3/uL (ref 4.0–10.5)

## 2019-01-07 LAB — BASIC METABOLIC PANEL
BUN: 30 mg/dL — ABNORMAL HIGH (ref 6–23)
CO2: 30 mEq/L (ref 19–32)
CREATININE: 1.66 mg/dL — AB (ref 0.40–1.50)
Calcium: 9.7 mg/dL (ref 8.4–10.5)
Chloride: 102 mEq/L (ref 96–112)
GFR: 41.43 mL/min — ABNORMAL LOW (ref >60.00)
Glucose, Bld: 89 mg/dL (ref 70–99)
Potassium: 4.9 mEq/L (ref 3.5–5.1)
Sodium: 138 mEq/L (ref 135–145)

## 2019-01-07 LAB — HEMOGLOBIN A1C: Hgb A1c MFr Bld: 6.1 % (ref 4.6–6.5)

## 2019-01-07 NOTE — Assessment & Plan Note (Signed)
cmp

## 2019-01-07 NOTE — Assessment & Plan Note (Signed)
Constant chest tightness assoc with SOB - started two weeks ago, worse with deep breathes Unlikely cardiac ischemia due constant pain, ? Pericardial effusion ?  Musculoskeletal in nature although somewhat atypical not improving-nontender to palpation ?  Hiatal hernia, atypical GERD Most likely pulmonary in nature Bilateral rib/chest x-ray Echocardiogram CBC, CMP EKG: Sinus rhythm at 60 bpm, old anterior infarct.  No change compared to EKG

## 2019-01-07 NOTE — Assessment & Plan Note (Signed)
Statin intolerant Taking fenofibrate Lipid, cmp

## 2019-01-07 NOTE — Assessment & Plan Note (Signed)
Current chest tightness unlikely related to CAD - symptoms atypical of cardiac ischemia Has cardio f/u Continue current medications

## 2019-01-07 NOTE — Assessment & Plan Note (Signed)
CBC

## 2019-01-07 NOTE — Assessment & Plan Note (Signed)
BP well controlled Current regimen effective and well tolerated Continue current medications at current doses cmp  

## 2019-01-07 NOTE — Assessment & Plan Note (Signed)
Check a1c Low sugar / carb diet Stressed regular exercise   

## 2019-01-08 ENCOUNTER — Encounter: Payer: Self-pay | Admitting: Internal Medicine

## 2019-01-08 ENCOUNTER — Ambulatory Visit (HOSPITAL_COMMUNITY): Payer: Medicare Other | Attending: Cardiology

## 2019-01-08 ENCOUNTER — Other Ambulatory Visit: Payer: Self-pay

## 2019-01-08 DIAGNOSIS — R0789 Other chest pain: Secondary | ICD-10-CM | POA: Insufficient documentation

## 2019-01-11 ENCOUNTER — Telehealth: Payer: Self-pay | Admitting: Cardiovascular Disease

## 2019-01-11 NOTE — Telephone Encounter (Signed)
I returned phone call to number listed but was unable to speak with anyone.   Diabetic testing should be followed by primary care.

## 2019-01-11 NOTE — Telephone Encounter (Signed)
New message      Per [facility] patient needs prior authorization for diabetic testing.

## 2019-01-19 ENCOUNTER — Other Ambulatory Visit: Payer: Self-pay | Admitting: Cardiovascular Disease

## 2019-01-21 ENCOUNTER — Ambulatory Visit (INDEPENDENT_AMBULATORY_CARE_PROVIDER_SITE_OTHER): Payer: Medicare Other | Admitting: Cardiovascular Disease

## 2019-01-21 ENCOUNTER — Encounter: Payer: Self-pay | Admitting: Cardiovascular Disease

## 2019-01-21 VITALS — BP 132/78 | HR 62 | Ht 67.0 in | Wt 164.1 lb

## 2019-01-21 DIAGNOSIS — I2510 Atherosclerotic heart disease of native coronary artery without angina pectoris: Secondary | ICD-10-CM | POA: Diagnosis not present

## 2019-01-21 DIAGNOSIS — E781 Pure hyperglyceridemia: Secondary | ICD-10-CM | POA: Diagnosis not present

## 2019-01-21 NOTE — Patient Instructions (Signed)

## 2019-01-21 NOTE — Progress Notes (Signed)
Cardiology Office Note   Date:  01/21/2019   ID:  Collin [name], DOB 06/25/1925, MRN [id number]  PCP:  Binnie Rail, MD  Cardiologist:  Mertie Moores, MD , previous patient of Dr. Ron Parker  Chief Complaint  Patient presents with  . Coronary Artery Disease   Problem List 1. CAD- s/p stenting in 2005.  2. Essential HTN       Collin [name] is a 85 y.o. male who presents for follow-up of coronary disease.  Had a stent placed by Dr. Olevia Perches in 2005.  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   Goes bowling twice a week Took Atorvastatin for years but stopped due to leg pain .  Last lipids were drawn in Dec.  - showed markedly elevated Trig levels and high chol levels.   April 21, 2017:  Collin [name] has a hx of CAD Hyperlipidemia Was recently diagnosed with DM and He has greatly improved his diet. He's cut back on lots of carbohydrates. He's lost about 5 pounds. He's noticed that his blood pressure has been low. This morning he took only half of his valsartan tablet.  Takes SBE prophylasix - has done so for years.  No prosthetic valve and no artificial joints  Jan. 8, 2019:  Collin [name] is seen back for follow-up visit for his coronary artery disease and diabetes mellitus.   He walks on the treadmill 4-5 times a week for 30 minutes at a time.  He denies any angina while walking the treadmill.  He still bowls once or twice a week.  He notes that his blood pressure has been somewhat variable.  It seems to go up in the evenings.  Eats prepared meals every meal.      July 24, 2018:    Collin [name] is seen today for follow up of his CAD Has pain in his calves with walking -  Resolves with walking  Had a [facility] screening of PAD screening  No CP  Still works out on the treadmill, bowl one a week   Recent lab work shows that his triglyceride level is markedly elevated at 613.  Total  cholesterol is 179.  His LDL is 77. Eats lots of cereal, through the day .     January 21, 2019:  Strained his chest recently while lifting a large box Has had some shortness of breath  Saw his primary   Echo shows a mildly depressed left ventricular systolic function with an ejection fraction of 45 to 50%.  There is hypokinesis of the inferior wall.  He has grade 1 diastolic dysfunction.  He has mild mitral regurgitation. The pain was clearly MSK.    Feeling better.   Breathing seems to be better .   Past Medical History:  Diagnosis Date  . Allergy   . Anemia   . Arthritis    gout  . Bladder cancer ([place])   . BPH (benign prostatic hyperplasia)   . CAD (coronary artery disease) 2005   DES January 2 005, residual disease  / Myoview  . Cancer ([place]) 02/28/2001   Left ureter cancer/bladder  . Carcinoma, renal cell ([place])   . Carotid artery disease ([place])    Doppler, September, 2010, normal, no carotid artery disease  . COPD (chronic obstructive pulmonary disease) ([place])   . Dysfunction of eustachian tube   .  Ejection fraction    EF 55%, echo, January, 2009  . Encounter for long-term (current) use of other medications   . Enlarged prostate   . GERD (gastroesophageal reflux disease)   . Gynecomastia    b/l  . HTN (hypertension)   . Hx of cystoscopy 11/02/12   negative  . Hx of skin cancer, basal cell   . Hypercholesterolemia   . Hyperlipidemia   . Low back pain syndrome   . Myocardial infarction ([place])    hx acute  . Nocturia   . Organic impotence   . Pulmonary nodule    Chest CT, followed  . Reactive airway disease   . 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 Staph infection 04/1997  . Thrombocytopenia ([place])     Past Surgical History:  Procedure Laterality Date  . BACK SURGERY  02/1996  . bladder tumors  2003, 2004, 01/2009   removed  . CARDIAC CATHETERIZATION    . CHOLECYSTECTOMY  07/2003  . 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 CORNEAL  TRANSPLANT  10/2008   left  . NEPHRECTOMY  02/2001   left kidney, ureter  . osteomyelitis     staph aureus  . URETERECTOMY  2003   for cancer    Patient Active Problem List   Diagnosis Date Noted  . CAD (coronary artery disease)     Priority: High  . Essential hypertension 04/23/2008    Priority: High  . Dyslipidemia 01/25/2007    Priority: High  . Chest tightness 01/07/2019  . Thrombocytopenia ([place])   . Skin cancer   . Pulmonary nodule   . Organic impotence   . Nocturia   . Myocardial infarction ([place])   . Low back pain syndrome   . COPD (chronic obstructive pulmonary disease) ([place])   . BPH (benign prostatic hyperplasia)   . Bladder cancer ([place])   . Arthritis   . Chronic kidney disease (CKD) 07/10/2017  . Diabetes ([place]) 04/15/2017  . Fatty liver 06/18/2016  . Rash and nonspecific skin eruption 06/13/2016  . LUQ fullness 06/13/2016  . H/O bacterial endocarditis 04/06/2016  . Myalgia and myositis 12/18/2015  . Abnormal CT of the abdomen 12/31/2013  . Abnormal chest x-ray 12/31/2013  . Gynecomastia   . Spinal stenosis, lumbar 05/12/2012  . Carotid artery disease ([place])   . CERVICAL RADICULOPATHY, LEFT 12/07/2009  . SKIN CANCER, HX OF 12/07/2009  . Renal cell carcinoma ([place]) 07/18/2009  . GERD 07/18/2009  . Cancer ([place]) 02/28/2001      Current Outpatient Medications  Medication Sig Dispense Refill  . acetaminophen (TYLENOL) 500 MG tablet Take 500 mg by mouth every 6 (six) hours as needed.    Marland Kitchen alfuzosin (UROXATRAL) 10 MG 24 hr tablet Take 10 mg by mouth daily.     . clopidogrel (PLAVIX) 75 MG tablet Take 1 tablet (75 mg total) by mouth daily. 90 tablet 2  . fenofibrate (TRICOR) 48 MG tablet Take 1 tablet (48 mg total) by mouth daily. 30 tablet 5  . finasteride (PROSCAR) 5 MG tablet Take 5 mg by mouth daily.    Marland Kitchen losartan (COZAAR) 50 MG tablet TAKE 1 TABLET DAILY 90 tablet 1  . Multiple Vitamin (MULTIVITAMIN) tablet Take 1 tablet by mouth daily.      Marland Kitchen  triamcinolone ointment (KENALOG) 0.1 % Apply 1 application topically 2 (two) times daily.     No current facility-administered medications for this visit.     Allergies:  Atorvastatin; Peanut oil; and Trandolapril    Social History:  The patient  reports that he quit smoking about 27 years ago. His smoking use included pipe. He has quit using smokeless tobacco. He reports that he does not drink alcohol or use drugs.   Family History:  The patient's family history includes Coronary artery disease in his brother; Diabetes in his brother, mother, sister, and sister; Drug abuse in his brother; Heart failure (age of onset: 16) in his mother.    ROS:  Please see the history of present illness.  Patient denies fever, chills, headache, sweats, rash, change in vision, change in hearing, chest pain, cough, nausea or vomiting, urinary symptoms. All other systems are reviewed and are negative.    Physical Exam: Blood pressure 132/78, pulse 62, height 5\' 7"  (1.702 m), weight 164 lb 1.9 oz (74.4 kg), SpO2 96 %.  GEN:  Elderly male,  NAD  HEENT: Normal NECK: No JVD; No carotid bruits LYMPHATICS: No lymphadenopathy CARDIAC: RRR , soft systolic murmur  RESPIRATORY:  Clear to auscultation without rales, wheezing or rhonchi  ABDOMEN: Soft, non-tender, non-distended MUSCULOSKELETAL:  No edema; No deformity  SKIN: Warm and dry NEUROLOGIC:  Alert and oriented x 3   EKG:   Jan. 27, 2020:   NSR at 69.  Old inferior MI   Recent Labs: 11/28/2018: ALT 18 01/07/2019: BUN 30; Creatinine, Ser 1.66; Hemoglobin 14.2; Platelets 127.0; Potassium 4.9; Sodium 138    Lipid Panel    Component Value Date/Time   CHOL 214 (H) 11/28/2018 0818   TRIG 314.0 (H) 11/28/2018 0818   HDL 37.70 (L) 11/28/2018 0818   CHOLHDL 6 11/28/2018 0818   VLDL 62.8 (H) 11/28/2018 0818   LDLCALC 49 02/02/2015 0841   LDLDIRECT 110.0 11/28/2018 0818      Wt Readings from Last 3 Encounters:  01/21/19 164 lb 1.9 oz (74.4 kg)    01/07/19 164 lb (74.4 kg)  10/08/18 166 lb (75.3 kg)      Current medicines are reviewed. The patient has a very good understanding of his medications. No changes are to be made.       ASSESSMENT AND PLAN:  1. CAD-  His of previous Inf. MI.  Stable EF is 45-50%  2. Essential HTN-he has been splitting his losartan in half.  He takes 25 mg twice a day.  He seems to be doing well with this.  Blood pressure is normal today.  3. Hyperlipidemia -    Has started on Tricor 48 mg a day  He will likely need to increase the dose.  Still eats lots of dry cereal .   4.  Possible claudication:      Mertie Moores, MD  01/21/2019 2:19 PM    [facility] [address],  [address] [place], [place]  [address] Pager [phone] Phone: [phone]; Fax: [phone]

## 2019-03-20 ENCOUNTER — Other Ambulatory Visit: Payer: Self-pay | Admitting: Internal Medicine

## 2019-07-07 NOTE — Progress Notes (Signed)
Subjective:    Patient ID: Collin [name], male    DOB: 05-31-1925, 85 y.o.   MRN: [id number]  HPI The patient is here for follow up.  He is exercising regularly - walks on treadmill.     He has calf soreness and fatigue with walking.    Diabetes: He is taking his medication daily as prescribed. He is compliant with a diabetic diet.   He checks his feet daily and denies foot lesions. He is up-to-date with an ophthalmology examination.   CKD, h/o nephrectomy:  He is taking his medications as prescribed.   He avoids nsaids.    CAD, Hypertension: He is taking his medication daily. He is compliant with a low sodium diet.  He denies chest pain, palpitations, edema, shortness of breath and regular headaches.  He does not monitor his blood pressure at home.    Hyperlipidemia: He is statin intolerant. He is compliant with a low fat/cholesterol diet.      Medications and allergies reviewed with patient and updated if appropriate.  Patient Active Problem List   Diagnosis Date Noted  . Chest tightness 01/07/2019  . Thrombocytopenia ([place])   . Skin cancer   . Pulmonary nodule   . Organic impotence   . Nocturia   . Myocardial infarction ([place])   . Low back pain syndrome   . COPD (chronic obstructive pulmonary disease) ([place])   . BPH (benign prostatic hyperplasia)   . Bladder cancer ([place])   . Arthritis   . Chronic kidney disease (CKD) 07/10/2017  . Diabetes ([place]) 04/15/2017  . Fatty liver 06/18/2016  . Rash and nonspecific skin eruption 06/13/2016  . LUQ fullness 06/13/2016  . H/O bacterial endocarditis 04/06/2016  . Myalgia and myositis 12/18/2015  . Abnormal CT of the abdomen 12/31/2013  . Abnormal chest x-ray 12/31/2013  . Gynecomastia   . Spinal stenosis, lumbar 05/12/2012  . CAD (coronary artery disease)   . Carotid artery disease ([place])   . CERVICAL RADICULOPATHY, LEFT 12/07/2009  . SKIN CANCER, HX OF 12/07/2009  . Renal cell carcinoma ([place]) 07/18/2009  . GERD 07/18/2009   . Essential hypertension 04/23/2008  . Dyslipidemia 01/25/2007  . Cancer ([place]) 02/28/2001    Current Outpatient Medications on File Prior to Visit  Medication Sig Dispense Refill  . acetaminophen (TYLENOL) 500 MG tablet Take 500 mg by mouth every 6 (six) hours as needed.    Marland Kitchen alfuzosin (UROXATRAL) 10 MG 24 hr tablet Take 10 mg by mouth daily.     . clopidogrel (PLAVIX) 75 MG tablet Take 1 tablet (75 mg total) by mouth daily. 90 tablet 2  . fenofibrate (TRICOR) 48 MG tablet TAKE 1 TABLET DAILY 90 tablet 1  . finasteride (PROSCAR) 5 MG tablet Take 5 mg by mouth daily.    Marland Kitchen losartan (COZAAR) 50 MG tablet Take 1 tablet (50 mg total) by mouth daily. 90 tablet 3  . Multiple Vitamin (MULTIVITAMIN) tablet Take 1 tablet by mouth daily.      Marland Kitchen triamcinolone ointment (KENALOG) 0.1 % Apply 1 application topically 2 (two) times daily.     No current facility-administered medications on file prior to visit.     Past Medical History:  Diagnosis Date  . Allergy   . Anemia   . Arthritis    gout  . Bladder cancer ([place])   . BPH (benign prostatic hyperplasia)   . CAD (coronary artery disease) 2005   DES January 2 005, residual disease  / Myoview  .  Cancer ([place]) 02/28/2001   Left ureter cancer/bladder  . Carcinoma, renal cell ([place])   . Carotid artery disease ([place])    Doppler, September, 2010, normal, no carotid artery disease  . COPD (chronic obstructive pulmonary disease) ([place])   . Dysfunction of eustachian tube   . Ejection fraction    EF 55%, echo, January, 2009  . Encounter for long-term (current) use of other medications   . Enlarged prostate   . GERD (gastroesophageal reflux disease)   . Gynecomastia    b/l  . HTN (hypertension)   . Hx of cystoscopy 11/02/12   negative  . Hx of skin cancer, basal cell   . Hypercholesterolemia   . Hyperlipidemia   . Low back pain syndrome   . Myocardial infarction ([place])    hx acute  . Nocturia   . Organic impotence   . Pulmonary nodule    Chest  CT, followed  . Reactive airway disease   . 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 Staph infection 04/1997  . Thrombocytopenia ([place])     Past Surgical History:  Procedure Laterality Date  . BACK SURGERY  02/1996  . bladder tumors  2003, 2004, 01/2009   removed  . CARDIAC CATHETERIZATION    . CHOLECYSTECTOMY  07/2003  . 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 NEPHRECTOMY  02/2001   left kidney, ureter  . osteomyelitis     staph aureus  . URETERECTOMY  2003   for cancer    Social History   Socioeconomic History  . Marital status: Widowed    Spouse name: Not on file  . Number of children: Not on file  . Years of education: Not on file  . Highest education level: Not on file  Occupational History  . 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 Financial resource strain: Not hard at all  . Food insecurity    Worry: Never true    Inability: Never true  . Transportation needs    Medical: No    Non-medical: No  Tobacco Use  . Smoking status: Former Smoker    Types: Pipe    Quit date: 12/27/1991    Years since quitting: 27.5  . Smokeless tobacco: Former Systems developer  . Tobacco comment: Quit 20-30 years ago as of 2012  Substance and Sexual Activity  . Alcohol use: No    Alcohol/week: 0.0 standard drinks  . Drug use: No  . Sexual activity: Not Currently  Lifestyle  . Physical activity    Days per week: 5 days    Minutes per session: 30 min  . Stress: Not at all  Relationships  . Social connections    Talks on phone: More than three times a week    Gets together: More than three times a week    Attends religious service: More than 4 times per year    Active member of club or organization: Yes    Attends meetings of clubs or organizations: More than 4 times per year    Relationship status: Widowed  Other Topics Concern  . Not on file  Social History Narrative   30 minutes a day for 5  days a week    Family History  Problem Relation Age of Onset  . Heart failure Mother 58  . Diabetes Mother   . Coronary artery disease Brother   .  Diabetes Brother   . Drug abuse Brother   . Diabetes Sister   . Diabetes Sister     Review of Systems  Constitutional: Negative for chills and fever.  Respiratory: Positive for shortness of breath (? a little worse). Negative for cough and wheezing.   Cardiovascular: Negative for chest pain, palpitations and leg swelling.  Neurological: Positive for numbness (intermittent numbness in toe). Negative for light-headedness and headaches.  Hematological: Bruises/bleeds easily.       Objective:   Vitals:   07/08/19 1345  BP: (!) 150/82  Pulse: 63  Resp: 16  Temp: 98.3 F (36.8 C)  SpO2: 99%   BP Readings from Last 3 Encounters:  07/08/19 (!) 150/82  01/21/19 132/78  01/07/19 122/64   Wt Readings from Last 3 Encounters:  07/08/19 167 lb 12.8 oz (76.1 kg)  01/21/19 164 lb 1.9 oz (74.4 kg)  01/07/19 164 lb (74.4 kg)   Body mass index is 26.28 kg/m.   Physical Exam    Constitutional: Appears well-developed and well-nourished. No distress.  HENT:  Head: Normocephalic and atraumatic.  Neck: Neck supple. No tracheal deviation present. No thyromegaly present.  No cervical lymphadenopathy Cardiovascular: Normal rate, regular rhythm and normal heart sounds.  No murmur heard. No carotid bruit .  No edema. B/l DP and PT pulses palpable. Pulmonary/Chest: Effort normal and breath sounds normal. No respiratory distress. No has no wheezes. No rales.  Skin: Skin is warm and dry. Not diaphoretic.  Psychiatric: Normal mood and affect. Behavior is normal.   Diabetic Foot Exam - Simple   Simple Foot Form Diabetic Foot exam was performed with the following findings: Yes 07/08/2019  2:08 PM  Visual Inspection No deformities, no ulcerations, no other skin breakdown bilaterally: Yes Sensation Testing Intact to touch and monofilament testing  bilaterally: Yes Pulse Check Posterior Tibialis and Dorsalis pulse intact bilaterally: Yes Comments Mild onychomycosis       Assessment & Plan:    See Problem List for Assessment and Plan of chronic medical problems.

## 2019-07-07 NOTE — Patient Instructions (Addendum)
  Tests ordered today. Your results will be released to MyChart (or called to you) after review.  If any changes need to be made, you will be notified at that same time.   Medications reviewed and updated.  Changes include :   none  Your prescription(s) have been submitted to your pharmacy. Please take as directed and contact our office if you believe you are having problem(s) with the medication(s).    Please followup in 6 months   

## 2019-07-08 ENCOUNTER — Ambulatory Visit (INDEPENDENT_AMBULATORY_CARE_PROVIDER_SITE_OTHER): Payer: Medicare Other | Admitting: Internal Medicine

## 2019-07-08 ENCOUNTER — Other Ambulatory Visit (INDEPENDENT_AMBULATORY_CARE_PROVIDER_SITE_OTHER): Payer: Medicare Other

## 2019-07-08 ENCOUNTER — Other Ambulatory Visit: Payer: Self-pay

## 2019-07-08 ENCOUNTER — Encounter: Payer: Self-pay | Admitting: Internal Medicine

## 2019-07-08 VITALS — BP 150/82 | HR 63 | Temp 98.3°F | Resp 16 | Ht 67.0 in | Wt 167.8 lb

## 2019-07-08 DIAGNOSIS — N189 Chronic kidney disease, unspecified: Secondary | ICD-10-CM | POA: Diagnosis not present

## 2019-07-08 DIAGNOSIS — I2510 Atherosclerotic heart disease of native coronary artery without angina pectoris: Secondary | ICD-10-CM | POA: Diagnosis not present

## 2019-07-08 DIAGNOSIS — I10 Essential (primary) hypertension: Secondary | ICD-10-CM

## 2019-07-08 DIAGNOSIS — E119 Type 2 diabetes mellitus without complications: Secondary | ICD-10-CM

## 2019-07-08 DIAGNOSIS — E785 Hyperlipidemia, unspecified: Secondary | ICD-10-CM

## 2019-07-08 LAB — COMPREHENSIVE METABOLIC PANEL
ALT: 13 U/L (ref 0–53)
AST: 14 U/L (ref 0–37)
Albumin: 4.6 g/dL (ref 3.5–5.2)
Alkaline Phosphatase: 47 U/L (ref 39–117)
BUN: 23 mg/dL (ref 6–23)
CO2: 28 mEq/L (ref 19–32)
Calcium: 9.2 mg/dL (ref 8.4–10.5)
Chloride: 106 mEq/L (ref 96–112)
Creatinine, Ser: 1.57 mg/dL — ABNORMAL HIGH (ref 0.40–1.50)
GFR: 41.52 mL/min — ABNORMAL LOW (ref >60.00)
Glucose, Bld: 85 mg/dL (ref 70–99)
Potassium: 5.1 mEq/L (ref 3.5–5.1)
Sodium: 141 mEq/L (ref 135–145)
Total Bilirubin: 0.7 mg/dL (ref 0.2–1.2)
Total Protein: 6.6 g/dL (ref 6.0–8.3)

## 2019-07-08 LAB — CBC WITH DIFFERENTIAL/PLATELET
Basophils Absolute: 0 10*3/uL (ref 0.0–0.1)
Basophils Relative: 0.9 % (ref 0.0–3.0)
Eosinophils Absolute: 0.1 10*3/uL (ref 0.0–0.7)
Eosinophils Relative: 3.6 % (ref 0.0–5.0)
HCT: 40.2 % (ref 39.0–52.0)
Hemoglobin: 13.6 g/dL (ref 13.0–17.0)
Lymphocytes Relative: 32.3 % (ref 12.0–46.0)
Lymphs Abs: 1.3 10*3/uL (ref 0.7–4.0)
MCHC: 33.8 g/dL (ref 30.0–36.0)
MCV: 93.5 fl (ref 78.0–100.0)
Monocytes Absolute: 0.5 10*3/uL (ref 0.1–1.0)
Monocytes Relative: 13 % — ABNORMAL HIGH (ref 3.0–12.0)
Neutro Abs: 2 10*3/uL (ref 1.4–7.7)
Neutrophils Relative %: 50.2 % (ref 43.0–77.0)
Platelets: 126 10*3/uL — ABNORMAL LOW (ref 150.0–400.0)
RBC: 4.29 Mil/uL (ref 4.22–5.81)
RDW: 13.6 % (ref 11.5–15.5)
WBC: 4 10*3/uL (ref 4.0–10.5)

## 2019-07-08 LAB — LIPID PANEL
Cholesterol: 180 mg/dL (ref 0–200)
HDL: 41.5 mg/dL (ref >39.00)
NonHDL: 138.19
Total CHOL/HDL Ratio: 4
Triglycerides: 210 mg/dL — ABNORMAL HIGH (ref 0.0–149.0)
VLDL: 42 mg/dL — ABNORMAL HIGH (ref 0.0–40.0)

## 2019-07-08 LAB — TSH: TSH: 3.4 u[IU]/mL (ref 0.35–4.50)

## 2019-07-08 LAB — LDL CHOLESTEROL, DIRECT: Direct LDL: 107 mg/dL

## 2019-07-08 LAB — HEMOGLOBIN A1C: Hgb A1c MFr Bld: 6.2 % (ref 4.6–6.5)

## 2019-07-08 NOTE — Assessment & Plan Note (Signed)
Diet controlled Check a1c Continue diabetic diet and regular exercise FU in 6 months

## 2019-07-08 NOTE — Assessment & Plan Note (Signed)
No concerning symptoms Following with cardiology Continue current medications

## 2019-07-08 NOTE — Assessment & Plan Note (Signed)
cmp

## 2019-07-08 NOTE — Assessment & Plan Note (Addendum)
On tricor Statin intolerant Check lipids, cmp, tsh Continue regular exercise and healthy diet

## 2019-07-08 NOTE — Assessment & Plan Note (Addendum)
BP is elevated here today - BP well controlled at home BP well controlled Current regimen effective and well tolerated Continue current medications at current doses cmp

## 2019-08-09 ENCOUNTER — Other Ambulatory Visit: Payer: Self-pay | Admitting: Cardiovascular Disease

## 2019-09-16 ENCOUNTER — Other Ambulatory Visit: Payer: Self-pay | Admitting: Internal Medicine

## 2019-10-14 ENCOUNTER — Other Ambulatory Visit: Payer: Self-pay

## 2019-10-14 ENCOUNTER — Ambulatory Visit (INDEPENDENT_AMBULATORY_CARE_PROVIDER_SITE_OTHER): Payer: Medicare Other | Admitting: *Deleted

## 2019-10-14 ENCOUNTER — Ambulatory Visit: Payer: Medicare Other

## 2019-10-14 VITALS — BP 144/72 | HR 65 | Resp 17 | Ht 67.0 in | Wt 168.0 lb

## 2019-10-14 DIAGNOSIS — Z0000 Encounter for general adult medical examination without abnormal findings: Secondary | ICD-10-CM

## 2019-10-14 NOTE — Patient Instructions (Signed)
Continue doing brain stimulating activities (puzzles, reading, adult coloring books, staying active) to keep memory sharp.   Continue to eat heart healthy diet (full of fruits, vegetables, whole grains, lean protein, water--limit salt, fat, and sugar intake) and increase physical activity as tolerated.   Collin [name] , Thank you for taking time to come for your Medicare Wellness Visit. I appreciate your ongoing commitment to your health goals. Please review the following plan we discussed and let me know if I can assist you in the future.   These are the goals we discussed: Goals      Patient Stated   . patient (pt-stated)     Find healthy snacks;        Other   . lose weight     Monitor diet for carbohydrates, increase protein, continue to walk on  treadmill, worship God, and enjoy life.       This is a list of the screening recommended for you and due dates:  Health Maintenance  Topic Date Due  . Eye exam for diabetics  10/02/2019  . Complete foot exam   07/07/2020  . Tetanus Vaccine  07/18/2027  . Flu Shot  Completed  . Pneumonia vaccines  Completed    Preventive Care 40 Years and Older, Male Preventive care refers to lifestyle choices and visits with your health care provider that can promote health and wellness. This includes:  A yearly physical exam. This is also called an annual well check.  Regular dental and eye exams.  Immunizations.  Screening for certain conditions.  Healthy lifestyle choices, such as diet and exercise. What can I expect for my preventive care visit? Physical exam Your health care provider will check:  Height and weight. These may be used to calculate body mass index (BMI), which is a measurement that tells if you are at a healthy weight.  Heart rate and blood pressure.  Your skin for abnormal spots. Counseling Your health care provider may ask you questions about:  Alcohol, tobacco, and drug use.  Emotional well-being.  Home and  relationship well-being.  Sexual activity.  Eating habits.  History of falls.  Memory and ability to understand (cognition).  Work and work Statistician. What immunizations do I need?  Influenza (flu) vaccine  This is recommended every year. Tetanus, diphtheria, and pertussis (Tdap) vaccine  You may need a Td booster every 10 years. Varicella (chickenpox) vaccine  You may need this vaccine if you have not already been vaccinated. Zoster (shingles) vaccine  You may need this after age 76. Pneumococcal conjugate (PCV13) vaccine  One dose is recommended after age 41. Pneumococcal polysaccharide (PPSV23) vaccine  One dose is recommended after age 60. Measles, mumps, and rubella (MMR) vaccine  You may need at least one dose of MMR if you were born in 1957 or later. You may also need a second dose. Meningococcal conjugate (MenACWY) vaccine  You may need this if you have certain conditions. Hepatitis A vaccine  You may need this if you have certain conditions or if you travel or work in places where you may be exposed to hepatitis A. Hepatitis B vaccine  You may need this if you have certain conditions or if you travel or work in places where you may be exposed to hepatitis B. Haemophilus influenzae type b (Hib) vaccine  You may need this if you have certain conditions. You may receive vaccines as individual doses or as more than one vaccine together in one shot (combination vaccines).  Talk with your health care provider about the risks and benefits of combination vaccines. What tests do I need? Blood tests  Lipid and cholesterol levels. These may be checked every 5 years, or more frequently depending on your overall health.  Hepatitis C test.  Hepatitis B test. Screening  Lung cancer screening. You may have this screening every year starting at age 37 if you have a 30-pack-year history of smoking and currently smoke or have quit within the past 15 years.   Colorectal cancer screening. All adults should have this screening starting at age 31 and continuing until age 26. Your health care provider may recommend screening at age 37 if you are at increased risk. You will have tests every 1-10 years, depending on your results and the type of screening test.  Prostate cancer screening. Recommendations will vary depending on your family history and other risks.  Diabetes screening. This is done by checking your blood sugar (glucose) after you have not eaten for a while (fasting). You may have this done every 1-3 years.  Abdominal aortic aneurysm (AAA) screening. You may need this if you are a current or former smoker.  Sexually transmitted disease (STD) testing. Follow these instructions at home: Eating and drinking  Eat a diet that includes fresh fruits and vegetables, whole grains, lean protein, and low-fat dairy products. Limit your intake of foods with high amounts of sugar, saturated fats, and salt.  Take vitamin and mineral supplements as recommended by your health care provider.  Do not drink alcohol if your health care provider tells you not to drink.  If you drink alcohol: ? Limit how much you have to 0-2 drinks a day. ? Be aware of how much alcohol is in your drink. In the [place] one drink equals one 12 oz bottle of beer (355 mL), one 5 oz glass of [place] (148 mL), or one 1 oz glass of hard liquor (44 mL). Lifestyle  Take daily care of your teeth and gums.  Stay active. Exercise for at least 30 minutes on 5 or more days each week.  Do not use any products that contain nicotine or tobacco, such as cigarettes, e-cigarettes, and chewing tobacco. If you need help quitting, ask your health care provider.  If you are sexually active, practice safe sex. Use a condom or other form of protection to prevent STIs (sexually transmitted infections).  Talk with your health care provider about taking a low-dose aspirin or statin. What's next?  Visit  your health care provider once a year for a well check visit.  Ask your health care provider how often you should have your eyes and teeth checked.  Stay up to date on all vaccines. This information is not intended to replace advice given to you by your health care provider. Make sure you discuss any questions you have with your health care provider. Document Released: 01/08/2016 Document Revised: 12/06/2018 Document Reviewed: 12/06/2018 Elsevier Patient Education  2020 [employer].

## 2019-10-14 NOTE — Progress Notes (Addendum)
Subjective:   Collin [name] is a 85 y.o. male who presents for Medicare Annual/Subsequent preventive examination.  Review of Systems:   Cardiac Risk Factors include: advanced age (>60men, >79 women);hypertension;male gender;diabetes mellitus Sleep patterns: has difficulty falling asleep, gets up 1-2 times nightly to void and sleeps 5-6 hours nightly.    Home Safety/Smoke Alarms: Feels safe in home. Smoke alarms in place.  Living environment; residence and Firearm Safety: 1-story house/ trailer. Lives alone, no needs for DME, good support system Seat Belt Safety/Bike Helmet: Wears seat belt.     Objective:    Vitals: BP (!) 144/72   Pulse 65   Resp 17   Ht 5\' 7"  (1.702 m)   Wt 168 lb (76.2 kg)   SpO2 98%   BMI 26.31 kg/m   Body mass index is 26.31 kg/m.  Advanced Directives 10/14/2019 10/08/2018 10/04/2017 08/06/2016 04/06/2016  Does Patient Have a Medical Advance Directive? Yes Yes Yes No Yes  Type of Paramedic of [place];Living will [place];Living will [place];Living will - -  Copy of [place] in Chart? No - copy requested No - copy requested No - copy requested - -  Would patient like information on creating a medical advance directive? - - - No - patient declined information -    Tobacco Social History   Tobacco Use  Smoking Status Former Smoker  . Types: Pipe  . Quit date: 12/27/1991  . Years since quitting: 27.8  Smokeless Tobacco Former Systems developer  Tobacco Comment   Quit 20-30 years ago as of 2012     Counseling given: Not Answered Comment: Quit 20-30 years ago as of 2012  Past Medical History:  Diagnosis Date  . Allergy   . Anemia   . Arthritis    gout  . Bladder cancer ([place])   . BPH (benign prostatic hyperplasia)   . CAD (coronary artery disease) 2005   DES January 2 005, residual disease  / Myoview  . Cancer ([place]) 02/28/2001   Left ureter cancer/bladder  . Carcinoma,  renal cell ([place])   . Carotid artery disease ([place])    Doppler, September, 2010, normal, no carotid artery disease  . COPD (chronic obstructive pulmonary disease) ([place])   . Dysfunction of eustachian tube   . Ejection fraction    EF 55%, echo, January, 2009  . Encounter for long-term (current) use of other medications   . Enlarged prostate   . GERD (gastroesophageal reflux disease)   . Gynecomastia    b/l  . HTN (hypertension)   . Hx of cystoscopy 11/02/12   negative  . Hx of skin cancer, basal cell   . Hypercholesterolemia   . Hyperlipidemia   . Low back pain syndrome   . Myocardial infarction ([place])    hx acute  . Nocturia   . Organic impotence   . Pulmonary nodule    Chest CT, followed  . Reactive airway disease   . 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 Staph infection 04/1997  . Thrombocytopenia ([place])    Past Surgical History:  Procedure Laterality Date  . BACK SURGERY  02/1996  . bladder tumors  2003, 2004, 01/2009   removed  . CARDIAC CATHETERIZATION    . CHOLECYSTECTOMY  07/2003  . 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 NEPHRECTOMY  02/2001   left kidney,  ureter  . osteomyelitis     staph aureus  . URETERECTOMY  2003   for cancer   Family History  Problem Relation Age of Onset  . Heart failure Mother 6  . Diabetes Mother   . Coronary artery disease Brother   . Diabetes Brother   . Drug abuse Brother   . Diabetes Sister   . Diabetes Sister    Social History   Socioeconomic History  . Marital status: Widowed    Spouse name: Not on file  . Number of children: 1  . Years of education: Not on file  . Highest education level: Not on file  Occupational History  . 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 Financial resource strain: Not hard at all  . Food insecurity    Worry: Never true    Inability: Never true  . Transportation needs    Medical: No     Non-medical: No  Tobacco Use  . Smoking status: Former Smoker    Types: Pipe    Quit date: 12/27/1991    Years since quitting: 27.8  . Smokeless tobacco: Former Systems developer  . Tobacco comment: Quit 20-30 years ago as of 2012  Substance and Sexual Activity  . Alcohol use: No    Alcohol/week: 0.0 standard drinks  . Drug use: No  . Sexual activity: Not Currently  Lifestyle  . Physical activity    Days per week: 0 days    Minutes per session: 0 min  . Stress: Not at all  Relationships  . Social connections    Talks on phone: More than three times a week    Gets together: More than three times a week    Attends religious service: 1 to 4 times per year    Active member of club or organization: Yes    Attends meetings of clubs or organizations: 1 to 4 times per year    Relationship status: Widowed  Other Topics Concern  . Not on file  Social History Narrative   30 minutes a day for 5 days a week    Outpatient Encounter Medications as of 10/14/2019  Medication Sig  . acetaminophen (TYLENOL) 500 MG tablet Take 500 mg by mouth every 6 (six) hours as needed.  Marland Kitchen alfuzosin (UROXATRAL) 10 MG 24 hr tablet Take 10 mg by mouth daily.   . clopidogrel (PLAVIX) 75 MG tablet TAKE 1 TABLET DAILY  . fenofibrate (TRICOR) 48 MG tablet TAKE 1 TABLET DAILY  . finasteride (PROSCAR) 5 MG tablet Take 5 mg by mouth daily.  Marland Kitchen losartan (COZAAR) 50 MG tablet Take 1 tablet (50 mg total) by mouth daily.  . Multiple Vitamin (MULTIVITAMIN) tablet Take 1 tablet by mouth daily.    Marland Kitchen triamcinolone ointment (KENALOG) 0.1 % Apply 1 application topically 2 (two) times daily.   No facility-administered encounter medications on file as of 10/14/2019.     Activities of Daily Living In your present state of health, do you have any difficulty performing the following activities: 10/14/2019  Hearing? N  Vision? N  Difficulty concentrating or making decisions? N  Walking or climbing stairs? N  Dressing or bathing? N  Doing  errands, shopping? N  Preparing Food and eating ? N  Using the Toilet? N  In the past six months, have you accidently leaked urine? N  Do you have problems with loss of bowel control? N  Managing your Medications? N  Managing your Finances? N  Housekeeping or managing your Housekeeping? N  Some recent data might be hidden    Patient Care Team: Binnie Rail, MD as PCP - General (Internal Medicine) Nahser, Wonda Cheng, MD as PCP - Cardiology (Cardiology)   Assessment:   This is a routine wellness examination for [place]. Physical assessment deferred to PCP.  Exercise Activities and Dietary recommendations Current Exercise Habits: The patient does not participate in regular exercise at present, Exercise limited by: orthopedic condition(s)  Diet (meal preparation, eat out, water intake, caffeinated beverages, dairy products, fruits and vegetables): in general, a "healthy" diet  , well balanced   Reviewed heart healthy and diabetic diet. Encouraged patient to increase daily water and healthy fluid intake.  Goals      Patient Stated   . patient (pt-stated)     Find healthy snacks;        Other   . lose weight     Monitor diet for carbohydrates, increase protein, continue to walk on  treadmill, worship God, and enjoy life.       Fall Risk Fall Risk  10/14/2019 07/08/2019 01/07/2019 10/08/2018 10/04/2017  Falls in the past year? 0 0 0 No No  Comment - - - - -  Number falls in past yr: 0 0 0 - -  Injury with Fall? 0 - - - -   Is the patient's home free of loose throw rugs in walkways, pet beds, electrical cords, etc?   yes      Grab bars in the bathroom? yes      Handrails on the stairs?   yes      Adequate lighting?   yes   Depression Screen PHQ 2/9 Scores 10/14/2019 07/08/2019 01/07/2019 10/08/2018  PHQ - 2 Score 1 0 0 0  PHQ- 9 Score 4 - - -    Cognitive Function MMSE - Mini Mental State Exam 10/08/2018 10/04/2017 04/06/2016  Not completed: - - (No Data)  Orientation  to time 5 5 -  Orientation to Place 5 5 -  Registration 3 3 -  Attention/ Calculation 5 5 -  Recall 3 3 -  Language- name 2 objects 2 2 -  Language- repeat 1 1 -  Language- follow 3 step command 3 3 -  Language- read & follow direction 1 1 -  Write a sentence 1 1 -  Copy design 1 1 -  Total score 30 30 -     6CIT Screen 10/14/2019  What Year? 0 points  What month? 0 points  What time? 0 points  Count back from 20 0 points  Months in reverse 0 points  Repeat phrase 0 points  Total Score 0    Immunization History  Administered Date(s) Administered  . Influenza Whole 09/17/2008, 07/26/2012, 09/10/2013  . Influenza, High Dose Seasonal PF 08/29/2014, 10/07/2016, 08/04/2018, 08/15/2019  . Influenza-Unspecified 08/31/2015, 09/21/2017  . Pneumococcal Conjugate-13 11/07/2016  . Pneumococcal-Unspecified 12/26/1996  . Tdap 07/17/2017  . Zoster Recombinat (Shingrix) 10/05/2017, 12/11/2017   Screening Tests Health Maintenance  Topic Date Due  . OPHTHALMOLOGY EXAM  10/02/2019  . FOOT EXAM  07/07/2020  . TETANUS/TDAP  07/18/2027  . INFLUENZA VACCINE  Completed  . PNA vac Low Risk Adult  Completed        Plan:    Reviewed health maintenance screenings with patient today and relevant education, vaccines, and/or referrals were provided.   I have personally reviewed and noted the following in the patient's chart:   . Medical and social  history . Use of alcohol, tobacco or illicit drugs  . Current medications and supplements . Functional ability and status . Nutritional status . Physical activity . Advanced directives . List of other physicians . Vitals . Screenings to include cognitive, depression, and falls . Referrals and appointments  In addition, I have reviewed and discussed with patient certain preventive protocols, quality metrics, and best practice recommendations. A written personalized care plan for preventive services as well as general preventive health  recommendations were provided to patient.     Michiel Cowboy, RN  10/14/2019    Medical screening examination/treatment/procedure(s) were performed by non-physician practitioner and as supervising physician I was immediately available for consultation/collaboration. I agree with above. Binnie Rail, MD

## 2019-10-22 ENCOUNTER — Other Ambulatory Visit: Payer: Self-pay | Admitting: Cardiovascular Disease

## 2019-10-22 MED ORDER — LOSARTAN POTASSIUM 50 MG PO TABS: 50.0000 mg | ORAL_TABLET | Freq: Every day | ORAL | 0 refills | Status: DC

## 2019-10-30 ENCOUNTER — Encounter: Payer: Self-pay | Admitting: Internal Medicine

## 2019-11-01 ENCOUNTER — Other Ambulatory Visit (INDEPENDENT_AMBULATORY_CARE_PROVIDER_SITE_OTHER): Payer: Medicare Other

## 2019-11-01 ENCOUNTER — Encounter: Payer: Self-pay | Admitting: Internal Medicine

## 2019-11-01 ENCOUNTER — Ambulatory Visit (INDEPENDENT_AMBULATORY_CARE_PROVIDER_SITE_OTHER): Payer: Medicare Other | Admitting: Internal Medicine

## 2019-11-01 ENCOUNTER — Ambulatory Visit (INDEPENDENT_AMBULATORY_CARE_PROVIDER_SITE_OTHER)
Admission: RE | Admit: 2019-11-01 | Discharge: 2019-11-01 | Disposition: A | Discharge status: Home / Self Care | Payer: Medicare Other | Source: Ambulatory Visit | Attending: Internal Medicine | Admitting: Internal Medicine

## 2019-11-01 ENCOUNTER — Other Ambulatory Visit: Payer: Self-pay

## 2019-11-01 VITALS — BP 120/70 | HR 77 | Temp 98.2°F | Ht 67.0 in | Wt 165.0 lb

## 2019-11-01 DIAGNOSIS — R0789 Other chest pain: Secondary | ICD-10-CM

## 2019-11-01 DIAGNOSIS — R0602 Shortness of breath: Secondary | ICD-10-CM | POA: Diagnosis not present

## 2019-11-01 LAB — CBC WITH DIFFERENTIAL/PLATELET
Basophils Absolute: 0.1 10*3/uL (ref 0.0–0.1)
Basophils Relative: 1.3 % (ref 0.0–3.0)
Eosinophils Absolute: 0.1 10*3/uL (ref 0.0–0.7)
Eosinophils Relative: 3.3 % (ref 0.0–5.0)
HCT: 41 % (ref 39.0–52.0)
Hemoglobin: 14.1 g/dL (ref 13.0–17.0)
Lymphocytes Relative: 25 % (ref 12.0–46.0)
Lymphs Abs: 1.1 10*3/uL (ref 0.7–4.0)
MCHC: 34.5 g/dL (ref 30.0–36.0)
MCV: 93.1 fl (ref 78.0–100.0)
Monocytes Absolute: 0.4 10*3/uL (ref 0.1–1.0)
Monocytes Relative: 9.8 % (ref 3.0–12.0)
Neutro Abs: 2.6 10*3/uL (ref 1.4–7.7)
Neutrophils Relative %: 60.6 % (ref 43.0–77.0)
Platelets: 128 10*3/uL — ABNORMAL LOW (ref 150.0–400.0)
RBC: 4.41 Mil/uL (ref 4.22–5.81)
RDW: 13.2 % (ref 11.5–15.5)
WBC: 4.2 10*3/uL (ref 4.0–10.5)

## 2019-11-01 LAB — COMPREHENSIVE METABOLIC PANEL
ALT: 15 U/L (ref 0–53)
AST: 15 U/L (ref 0–37)
Albumin: 4.6 g/dL (ref 3.5–5.2)
Alkaline Phosphatase: 45 U/L (ref 39–117)
BUN: 35 mg/dL — ABNORMAL HIGH (ref 6–23)
CO2: 28 mEq/L (ref 19–32)
Calcium: 9.8 mg/dL (ref 8.4–10.5)
Chloride: 102 mEq/L (ref 96–112)
Creatinine, Ser: 1.82 mg/dL — ABNORMAL HIGH (ref 0.40–1.50)
GFR: 34.99 mL/min — ABNORMAL LOW (ref >60.00)
Glucose, Bld: 111 mg/dL — ABNORMAL HIGH (ref 70–99)
Potassium: 4.7 mEq/L (ref 3.5–5.1)
Sodium: 139 mEq/L (ref 135–145)
Total Bilirubin: 1 mg/dL (ref 0.2–1.2)
Total Protein: 6.8 g/dL (ref 6.0–8.3)

## 2019-11-01 LAB — TROPONIN I (HIGH SENSITIVITY): High Sens Troponin I: 4 ng/L (ref 2–17)

## 2019-11-01 LAB — BRAIN NATRIURETIC PEPTIDE: Pro B Natriuretic peptide (BNP): 31 pg/mL (ref 0.0–100.0)

## 2019-11-01 NOTE — Telephone Encounter (Signed)
Appointment made

## 2019-11-01 NOTE — Assessment & Plan Note (Addendum)
Concern for cardiac ischemia, less likely PE, COPD, HF exac EKG today-shows Sinus rhythm at 69 bpm.  Old inferior-apical infarct.  No change compared to previous EKG from January 2020. Troponin, d-dimer Cbc, cmp, bnp CXR Further management depending on above  May need to go to ED-stressed that if his symptoms get worse he will need to go to the emergency room this weekend

## 2019-11-01 NOTE — Progress Notes (Signed)
Subjective:    Patient ID: Collin [name], male    DOB: 07-01-1925, 85 y.o.   MRN: [id number]  HPI The patient is here for an acute visit for shortness of breath.   Shortness of breath: He has noticed worsening of his breathing over the past month.  It has gotten worse and is now constant.  He has the SOB at rest.  He has center chest pressure, mild nausea.  The pressure started about a week ago and is constant. He has soreness in his chest, which occurs intermittently.   He is still using the treadmill and it makes the SOB worse.  When he bends over he does feel like his stomach pushes up and puts pressure on his lungs and esophagus.  He denies any fevers or cold symptoms.  Echo 12/2018: EF 45-50%.  Mild MR.  Mild diastolic dysfunction.  Hypokinesis of inferiolateral wall    Medications and allergies reviewed with patient and updated if appropriate.  Patient Active Problem List   Diagnosis Date Noted  . Shortness of breath 11/01/2019  . Chest pressure 11/01/2019  . Chest tightness 01/07/2019  . Thrombocytopenia ([place])   . Skin cancer   . Pulmonary nodule   . Organic impotence   . Nocturia   . Myocardial infarction ([place])   . Low back pain syndrome   . COPD (chronic obstructive pulmonary disease) ([place])   . BPH (benign prostatic hyperplasia)   . Bladder cancer ([place])   . Arthritis   . Chronic kidney disease (CKD) 07/10/2017  . Diabetes ([place]) 04/15/2017  . Fatty liver 06/18/2016  . Rash and nonspecific skin eruption 06/13/2016  . LUQ fullness 06/13/2016  . H/O bacterial endocarditis 04/06/2016  . Myalgia and myositis 12/18/2015  . Abnormal CT of the abdomen 12/31/2013  . Abnormal chest x-ray 12/31/2013  . Gynecomastia   . Spinal stenosis, lumbar 05/12/2012  . CAD (coronary artery disease)   . Carotid artery disease ([place])   . CERVICAL RADICULOPATHY, LEFT 12/07/2009  . SKIN CANCER, HX OF 12/07/2009  . Renal cell carcinoma ([place]) 07/18/2009  . GERD 07/18/2009  . Essential  hypertension 04/23/2008  . Dyslipidemia 01/25/2007  . Cancer ([place]) 02/28/2001    Current Outpatient Medications on File Prior to Visit  Medication Sig Dispense Refill  . acetaminophen (TYLENOL) 500 MG tablet Take 500 mg by mouth every 6 (six) hours as needed.    Marland Kitchen alfuzosin (UROXATRAL) 10 MG 24 hr tablet Take 10 mg by mouth daily.     . clopidogrel (PLAVIX) 75 MG tablet TAKE 1 TABLET DAILY 90 tablet 1  . fenofibrate (TRICOR) 48 MG tablet TAKE 1 TABLET DAILY 90 tablet 3  . finasteride (PROSCAR) 5 MG tablet Take 5 mg by mouth daily.    Marland Kitchen losartan (COZAAR) 50 MG tablet Take 1 tablet (50 mg total) by mouth daily. Please make yearly appt with Dr. Acie Fredrickson for January before anymore refills. 1st attempt 90 tablet 0  . Multiple Vitamin (MULTIVITAMIN) tablet Take 1 tablet by mouth daily.      Marland Kitchen triamcinolone ointment (KENALOG) 0.1 % Apply 1 application topically 2 (two) times daily.     No current facility-administered medications on file prior to visit.     Past Medical History:  Diagnosis Date  . Allergy   . Anemia   . Arthritis    gout  . Bladder cancer ([place])   . BPH (benign prostatic hyperplasia)   . CAD (coronary artery disease) 2005   DES  January 2 005, residual disease  / Myoview  . Cancer ([place]) 02/28/2001   Left ureter cancer/bladder  . Carcinoma, renal cell ([place])   . Carotid artery disease ([place])    Doppler, September, 2010, normal, no carotid artery disease  . COPD (chronic obstructive pulmonary disease) ([place])   . Dysfunction of eustachian tube   . Ejection fraction    EF 55%, echo, January, 2009  . Encounter for long-term (current) use of other medications   . Enlarged prostate   . GERD (gastroesophageal reflux disease)   . Gynecomastia    b/l  . HTN (hypertension)   . Hx of cystoscopy 11/02/12   negative  . Hx of skin cancer, basal cell   . Hypercholesterolemia   . Hyperlipidemia   . Low back pain syndrome   . Myocardial infarction ([place])    hx acute  . Nocturia   .  Organic impotence   . Pulmonary nodule    Chest CT, followed  . Reactive airway disease   . 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 Staph infection 04/1997  . Thrombocytopenia ([place])     Past Surgical History:  Procedure Laterality Date  . BACK SURGERY  02/1996  . bladder tumors  2003, 2004, 01/2009   removed  . CARDIAC CATHETERIZATION    . CHOLECYSTECTOMY  07/2003  . 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 NEPHRECTOMY  02/2001   left kidney, ureter  . osteomyelitis     staph aureus  . URETERECTOMY  2003   for cancer    Social History   Socioeconomic History  . Marital status: Widowed    Spouse name: Not on file  . Number of children: 1  . Years of education: Not on file  . Highest education level: Not on file  Occupational History  . 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 Financial resource strain: Not hard at all  . Food insecurity    Worry: Never true    Inability: Never true  . Transportation needs    Medical: No    Non-medical: No  Tobacco Use  . Smoking status: Former Smoker    Types: Pipe    Quit date: 12/27/1991    Years since quitting: 27.8  . Smokeless tobacco: Former Systems developer  . Tobacco comment: Quit 20-30 years ago as of 2012  Substance and Sexual Activity  . Alcohol use: No    Alcohol/week: 0.0 standard drinks  . Drug use: No  . Sexual activity: Not Currently  Lifestyle  . Physical activity    Days per week: 0 days    Minutes per session: 0 min  . Stress: Not at all  Relationships  . Social connections    Talks on phone: More than three times a week    Gets together: More than three times a week    Attends religious service: 1 to 4 times per year    Active member of club or organization: Yes    Attends meetings of clubs or organizations: 1 to 4 times per year    Relationship status: Widowed  Other Topics Concern  . Not on file  Social History  Narrative   30 minutes a day for 5 days a week    Family History  Problem Relation Age of Onset  . Heart failure Mother 51  . Diabetes Mother   .  Coronary artery disease Brother   . Diabetes Brother   . Drug abuse Brother   . Diabetes Sister   . Diabetes Sister     Review of Systems  Constitutional: Negative for chills and fever.  Eyes: Negative for visual disturbance.  Respiratory: Positive for shortness of breath. Negative for cough and wheezing.   Cardiovascular: Positive for chest pain. Negative for palpitations and leg swelling.  Gastrointestinal: Positive for nausea. Negative for abdominal pain, constipation and diarrhea.       BM start normal and then get loose, still regular  Neurological: Positive for dizziness (occ, with quick movements). Negative for light-headedness and headaches.       Objective:   Vitals:   11/01/19 1315  BP: 120/70  Pulse: 77  Temp: 98.2 F (36.8 C)  SpO2: 96%   BP Readings from Last 3 Encounters:  11/01/19 120/70  10/14/19 (!) 144/72  07/08/19 (!) 150/82   Wt Readings from Last 3 Encounters:  11/01/19 165 lb (74.8 kg)  10/14/19 168 lb (76.2 kg)  07/08/19 167 lb 12.8 oz (76.1 kg)   Body mass index is 25.84 kg/m.   Physical Exam    Constitutional: Appears well-developed and well-nourished. No distress.  HENT:  Head: Normocephalic and atraumatic.  Neck: Neck supple. No tracheal deviation present. No thyromegaly present.  No cervical lymphadenopathy Cardiovascular: Normal rate, regular rhythm and normal heart sounds.  No murmur heard. No carotid bruit .  No edema Pulmonary/Chest: Effort normal and breath sounds normal. No respiratory distress. Speaking full sentences w/o difficulty.  No has no wheezes. No rales.  Abdomen: soft, NT, ND Skin: Skin is warm and dry. Not diaphoretic.  Psychiatric: Normal mood and affect. Behavior is normal.       Assessment & Plan:    See Problem List for Assessment and Plan of chronic  medical problems.

## 2019-11-01 NOTE — Assessment & Plan Note (Signed)
Progressive SOB x 1 month, much worse in last 2 weeks - has it at rest Has central chest pressure and nausea Has CAD - concern for cardiac ischemia, heart failure less likely given clear lungs and no edema, PE unlikely given good 02 level and not tachycardic, unlikely COPD, need to r/o tumor - h/o CA EKG now CXR, labs - cbc, cmp, d-dimer, troponin, bnp Doubt covid -  Not other symptoms - will have him get tested

## 2019-11-01 NOTE — Patient Instructions (Addendum)
Have blood work and an Insurance account manager today.  Tests ordered today. Your results will be released to [place] (or called to you) after review.  If any changes need to be made, you will be notified at that same time.   An EKG was done today.

## 2019-11-02 LAB — D-DIMER, QUANTITATIVE: D-Dimer, Quant: 0.44 mcg/mL FEU (ref <0.50)

## 2019-11-03 ENCOUNTER — Encounter: Payer: Self-pay | Admitting: Internal Medicine

## 2019-11-04 NOTE — Progress Notes (Signed)
Cardiology Office Note    Date:  11/05/2019   ID:  Collin [name], DOB 1925/10/14, MRN [id number]  PCP:  Binnie Rail, MD  Cardiologist:  Dr. Acie Fredrickson  Chief Complaint: SOB and chest pain   History of Present Illness:   Collin [name] is a 85 y.o. male with hx of CAD s/p stenting in 2005 by Dr. Olevia Perches, HTN, HLD, CKD III and COPD seen for SOB and chest pain.   Last cath in 2009 showed patent stent and otherwise moderate disease within the left anterior descending and ostial right coronary artery.  Comparison with prior films from 2005 demonstrates no marked degree of progression although suspect there has been some diffuse progressive atherosclerosis in comparison>> recommended medical therapy.    Echo 12/2018 showed a mildly depressed left ventricular systolic function with an ejection fraction of 45 to 50%.  There is hypokinesis of the inferior wall.  He has grade 1 diastolic dysfunction.  He has mild mitral regurgitation. Medical therapy.  Collin [name] presented for evaluation of shortness of breath x  4 weeks.  He reports mild dyspnea at baseline which gets worse with any activity.  Progressively worsened.  He has associated chest tightness across the chest intermittently.  No radiation or nausea.  He denies palpitation, orthopnea, PND, syncope or lower extremity edema.  Patient was seen by PCP last week.  He had normal D-dimer, BNP and troponin level.  Chest x-ray without acute cardiopulmonary disease.  He reports somewhat similar symptoms mentioned stenting in 2005 but much less intensity.   Past Medical History:  Diagnosis Date  . Allergy   . Anemia   . Arthritis    gout  . Bladder cancer ([place])   . BPH (benign prostatic hyperplasia)   . CAD (coronary artery disease) 2005   DES January 2 005, residual disease  / Myoview  . Cancer ([place]) 02/28/2001   Left ureter cancer/bladder  . Carcinoma, renal cell ([place])   . Carotid artery disease ([place])    Doppler, September, 2010, normal, no  carotid artery disease  . COPD (chronic obstructive pulmonary disease) ([place])   . Dysfunction of eustachian tube   . Ejection fraction    EF 55%, echo, January, 2009  . Encounter for long-term (current) use of other medications   . Enlarged prostate   . GERD (gastroesophageal reflux disease)   . Gynecomastia    b/l  . HTN (hypertension)   . Hx of cystoscopy 11/02/12   negative  . Hx of skin cancer, basal cell   . Hypercholesterolemia   . Hyperlipidemia   . Low back pain syndrome   . Myocardial infarction ([place])    hx acute  . Nocturia   . Organic impotence   . Pulmonary nodule    Chest CT, followed  . Reactive airway disease   . 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 Staph infection 04/1997  . Thrombocytopenia ([place])     Past Surgical History:  Procedure Laterality Date  . BACK SURGERY  02/1996  . bladder tumors  2003, 2004, 01/2009   removed  . CARDIAC CATHETERIZATION    . CHOLECYSTECTOMY  07/2003  . 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 NEPHRECTOMY  02/2001   left kidney, ureter  . osteomyelitis     staph aureus  . URETERECTOMY  2003   for cancer    Current Medications: Prior  to Admission medications   Medication Sig Start Date End Date Taking? Authorizing Provider  acetaminophen (TYLENOL) 500 MG tablet Take 500 mg by mouth every 6 (six) hours as needed.    [provider]  alfuzosin (UROXATRAL) 10 MG 24 hr tablet Take 10 mg by mouth daily.  01/19/12   [provider]  clopidogrel (PLAVIX) 75 MG tablet TAKE 1 TABLET DAILY 08/09/19   Nahser, Wonda Cheng, MD  fenofibrate (TRICOR) 48 MG tablet TAKE 1 TABLET DAILY 09/16/19   Binnie Rail, MD  finasteride (PROSCAR) 5 MG tablet Take 5 mg by mouth daily.    [provider]  losartan (COZAAR) 50 MG tablet Take 1 tablet (50 mg total) by mouth daily. Please make yearly appt with Dr. Acie Fredrickson for January before anymore refills. 1st attempt  10/22/19   Nahser, Wonda Cheng, MD  Multiple Vitamin (MULTIVITAMIN) tablet Take 1 tablet by mouth daily.      [provider]  triamcinolone ointment (KENALOG) 0.1 % Apply 1 application topically 2 (two) times daily.    [provider]    Allergies:   Atorvastatin, Peanut oil, and Trandolapril   Social History   Socioeconomic History  . Marital status: Widowed    Spouse name: Not on file  . Number of children: 1  . Years of education: Not on file  . Highest education level: Not on file  Occupational History  . 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 Financial resource strain: Not hard at all  . Food insecurity    Worry: Never true    Inability: Never true  . Transportation needs    Medical: No    Non-medical: No  Tobacco Use  . Smoking status: Former Smoker    Types: Pipe    Quit date: 12/27/1991    Years since quitting: 27.8  . Smokeless tobacco: Former Systems developer  . Tobacco comment: Quit 20-30 years ago as of 2012  Substance and Sexual Activity  . Alcohol use: No    Alcohol/week: 0.0 standard drinks  . Drug use: No  . Sexual activity: Not Currently  Lifestyle  . Physical activity    Days per week: 0 days    Minutes per session: 0 min  . Stress: Not at all  Relationships  . Social connections    Talks on phone: More than three times a week    Gets together: More than three times a week    Attends religious service: 1 to 4 times per year    Active member of club or organization: Yes    Attends meetings of clubs or organizations: 1 to 4 times per year    Relationship status: Widowed  Other Topics Concern  . Not on file  Social History Narrative   30 minutes a day for 5 days a week     Family History:  The patient's family history includes Coronary artery disease in his brother; Diabetes in his brother, mother, sister, and sister; Drug abuse in his brother; Heart failure (age of onset: 43) in his mother.  ROS:   Please see the  history of present illness.    ROS All other systems reviewed and are negative.   PHYSICAL EXAM:   VS:  BP 124/66   Pulse 61   Ht 5\' 7"  (1.702 m)   Wt 166 lb 12.8 oz (75.7 kg)   SpO2 98%   BMI 26.12 kg/m    GEN: Well nourished, well  developed, in no acute distress  HEENT: normal  Neck: no JVD, carotid bruits, or masses Cardiac: RRR; no murmurs, rubs, or gallops,no edema  Respiratory:  clear to auscultation bilaterally, normal work of breathing GI: soft, nontender, nondistended, + BS MS: no deformity or atrophy  Skin: warm and dry, no rash Neuro:  Alert and Oriented x 3, Strength and sensation are intact Psych: euthymic mood, full affect  Wt Readings from Last 3 Encounters:  11/05/19 166 lb 12.8 oz (75.7 kg)  11/01/19 165 lb (74.8 kg)  10/14/19 168 lb (76.2 kg)      Studies/Labs Reviewed:   EKG:  EKG is ordered not  today.  The ekg ordered by PCP 11/6 by demonstrates normal sinus rhythm at rate of 69 bpm, no acute abnormality  Recent Labs: 07/08/2019: TSH 3.40 11/01/2019: ALT 15; BUN 35; Creatinine, Ser 1.82; Hemoglobin 14.1; Platelets 128.0; Potassium 4.7; Pro B Natriuretic peptide (BNP) 31.0; Sodium 139   Lipid Panel    Component Value Date/Time   CHOL 180 07/08/2019 1421   TRIG 210.0 (H) 07/08/2019 1421   HDL 41.50 07/08/2019 1421   CHOLHDL 4 07/08/2019 1421   VLDL 42.0 (H) 07/08/2019 1421   LDLCALC 49 02/02/2015 0841   LDLDIRECT 107.0 07/08/2019 1421    Additional studies/ records that were reviewed today include:   Echocardiogram: 12/2018 Study Conclusions  - Left ventricle: The cavity size was normal. Wall thickness was   normal. Systolic function was mildly reduced. The estimated   ejection fraction was in the range of 45% to 50%. There is   hypokinesis of the inferolateral myocardium. Doppler parameters   are consistent with abnormal left ventricular relaxation (grade 1   diastolic dysfunction). - Mitral valve: There was mild regurgitation.   Impressions:  - Hypokinesis of the inferolateral wall with overall mildly reduced   LV function (EF 50); mild diastolic dysfunction; mild MR.  Cath 12/2007 ANGIOGRAPHIC FINDINGS:  1. The left main coronary artery gives rise to the left anterior      descending and circumflex vessels.  This vessel was relatively      small and likely diffusely diseased.  There does look to be 30-40%      distal stenosis.  2. Left anterior descending is a small to medium caliber vessel      essentially with one large diagonal branch.  There is calcification      in the proximal to midportion of left anterior descending with an      area of approximately 60% diffuse stenosis around the takeoff of      the diagonal branch.  This branch is diseased at approximately 50-      60% in its ostium, and there are other scattered moderate stenoses      within the sub-branch system of the diagonal branch.  In the distal      left anterior descending is an area of 50% stenosis.  3. There is a medium caliber circumflex vessel with two very small      proximal obtuse marginal branches followed by three larger obtuse      marginal branches.  There is an area of 40% diffuse stenosis within      the midportion of the circumflex.  Otherwise, mild luminal      irregularity is noted.  4. The right coronary artery is a medium caliber dominant vessel.  A      stent is visualized within the distal portion of the right coronary  artery, and this area is widely patent.  In the ostial right      coronary artery is a 50% stenosis followed by an area of 30%      stenosis proximally.  There is diffuse 30-40% stenosis in the      midportion of the vessel.   Left ventriculography was not performed.   DIAGNOSES:  1. Coronary artery disease as outlined including a patent stent site      within the distal right coronary artery and otherwise moderate      disease within the left anterior descending and ostial right       coronary artery.  Comparison with prior films from 2005      demonstrates no marked degree of progression although suspect there      has been some diffuse progressive atherosclerosis in comparison.      No obvious focal culprit stands out to require percutaneous      intervention at this time, however.  2. No significant aortic valve gradient with a left ventricular end-      diastolic pressure of 9 mmHg.   DISCUSSION:  Medical therapy is anticipated.  No clear percutaneous  intervention requirements at this time.  The patient will be hydrated  overnight with follow-up BMET in the morning.      Satira Sark, MD  Electronically Signed  ASSESSMENT & PLAN:    1. Exertional shortness of breath and chest discomfort - He reports 4 weeks history of mild dyspnea at baseline.  His shortness of breath gets worse with any activity, progressively worsened and associated intermittently with chest discomfort.  Symptoms similar when he had a stenting in 2005 but much less intensity.  He used to do stuff but unable to do lately because these symptoms.  Reviewed lab work done by PCP last week.  Normal troponin, BNP and D-dimer.  He has reassuring chest x-ray with clear lungs on exam.  Does not suspect pulmonary etiology. Dr.Burns has discussed trial of prednisone but he declined. - Echocardiogram in January 2020 showed hypokinesis inferior lateral wall with mildly reduced LV function at 50%.  I do not think he is in acute heart failure given normal exam (ling clear and no edema), normal BNP and reassuring chest x-ray. -His symptoms concerning for worsening of CAD with angina.  I have discussed invasive versus noninvasive evaluation in the detail.  He lives by himself and very functional.  Cardiac catheterization would be ideal however he is unsure.  He is willing to try Coreg 3.25 mg twice daily as an antianginal therapy (follow HR).  His breathing is the main issue for him rather than chest  tightness.  He has sublingual nitroglycerin however never required.  Recommended close follow-up in 2 weeks.  He will call us or [name] to ER if worsening symptoms.  2. CAD s/p RCA stenting  - Last cath in 2009 showed patent stent and otherwise moderate disease within the left anterior descending and ostial right coronary artery.  Comparison with prior films from 2005 demonstrates no marked degree of progression although suspect there has been some diffuse progressive atherosclerosis in comparison. - Continue Plavix  3. HLD - 07/08/2019: Cholesterol 180; HDL 41.50; Triglycerides 210.0; VLDL 42.0  - Statin intolerance  4. CKD III - Baseline creatinine around 1.6. Last check 11/6 showed Scr of 1.82. Repeat BMET at follow up  5. HTN -  BP stable currently. If worsening renal function at follow up, consider reducing Losartan. Add BB  as above.    Medication Adjustments/Labs and Tests Ordered: Current medicines are reviewed at length with the patient today.  Concerns regarding medicines are outlined above.  Medication changes, Labs and Tests ordered today are listed in the Patient Instructions below. Patient Instructions  Medication Instructions:  Your physician has recommended you make the following change in your medication:  1.  START Coreg 3.125 taking 1 tablet twice a day  *If you need a refill on your cardiac medications before your next appointment, please call your pharmacy*  Lab Work: None ordered  If you have labs (blood work) drawn today and your tests are completely normal, you will receive your results only by: Marland Kitchen MyChart Message (if you have MyChart) OR . A paper copy in the mail If you have any lab test that is abnormal or we need to change your treatment, we will call you to review the results.  Testing/Procedures: None ordered  Follow-Up: At [facility], you and your health needs are our priority.  As part of our continuing mission to provide you with exceptional heart  care, we have created designated Provider Care Teams.  These Care Teams include your primary Cardiologist (physician) and Advanced Practice Providers (APPs -  Physician Assistants and Nurse Practitioners) who all work together to provide you with the care you need, when you need it.  Your next appointment:   2 WEEKS  The format for your next appointment:   Either In Person or Virtual  Provider:   Mertie Moores, MD  Other Instructions      Signed, Leanor Kail, PA  11/05/2019 1:22 PM    [facility] [place], [place], [place]  [address] Phone: [phone]; Fax: [phone]

## 2019-11-05 ENCOUNTER — Encounter: Payer: Self-pay | Admitting: Physician Assistant

## 2019-11-05 ENCOUNTER — Ambulatory Visit (INDEPENDENT_AMBULATORY_CARE_PROVIDER_SITE_OTHER): Payer: Medicare Other | Admitting: Physician Assistant

## 2019-11-05 ENCOUNTER — Other Ambulatory Visit: Payer: Self-pay

## 2019-11-05 VITALS — BP 124/66 | HR 61 | Ht 67.0 in | Wt 166.8 lb

## 2019-11-05 DIAGNOSIS — I2510 Atherosclerotic heart disease of native coronary artery without angina pectoris: Secondary | ICD-10-CM

## 2019-11-05 DIAGNOSIS — I10 Essential (primary) hypertension: Secondary | ICD-10-CM

## 2019-11-05 DIAGNOSIS — E781 Pure hyperglyceridemia: Secondary | ICD-10-CM | POA: Diagnosis not present

## 2019-11-05 DIAGNOSIS — N1831 Chronic kidney disease, stage 3a: Secondary | ICD-10-CM

## 2019-11-05 MED ORDER — CARVEDILOL 3.125 MG PO TABS: 3.1250 mg | ORAL_TABLET | Freq: Two times a day (BID) | ORAL | 3 refills | Status: DC

## 2019-11-05 NOTE — Patient Instructions (Signed)
Medication Instructions:  Your physician has recommended you make the following change in your medication:  1.  START Coreg 3.125 taking 1 tablet twice a day  *If you need a refill on your cardiac medications before your next appointment, please call your pharmacy*  Lab Work: None ordered  If you have labs (blood work) drawn today and your tests are completely normal, you will receive your results only by: Marland Kitchen MyChart Message (if you have MyChart) OR . A paper copy in the mail If you have any lab test that is abnormal or we need to change your treatment, we will call you to review the results.  Testing/Procedures: None ordered  Follow-Up: At [facility], you and your health needs are our priority.  As part of our continuing mission to provide you with exceptional heart care, we have created designated Provider Care Teams.  These Care Teams include your primary Cardiologist (physician) and Advanced Practice Providers (APPs -  Physician Assistants and Nurse Practitioners) who all work together to provide you with the care you need, when you need it.  Your next appointment:   2 WEEKS  The format for your next appointment:   Either In Person or Virtual  Provider:   Mertie Moores, MD  Other Instructions

## 2019-11-06 ENCOUNTER — Telehealth: Payer: Self-pay | Admitting: *Deleted

## 2019-11-06 NOTE — Telephone Encounter (Signed)
-----   Message from [place], [place] sent at 11/06/2019 12:31 PM EST ----- Please let patient know that Dr. Acie Fredrickson has recommended cath if no improvement of symptoms on Coreg.

## 2019-11-06 NOTE — Telephone Encounter (Signed)
Pt has been made aware that if he has no improvements on his symptoms with the Coreg, that Dr. Acie Fredrickson recommends a heart cath. Pt states he will let us know the outcome and we will decide from there.  Pt grateful for the call.

## 2019-11-18 ENCOUNTER — Telehealth: Payer: Self-pay | Admitting: Nurse Practitioner

## 2019-11-18 NOTE — Telephone Encounter (Signed)
Called patient to evaluate the burden of his symptoms per MyChart communication from Dr. Acie Fredrickson. He states he is having heaviness, SOB; has not taken NTG because he is not having pain. States his symptoms are not constant. I advised that his chest heaviness and SOB may be his type of angina and that he should try taking SL NTG to see if it relieves his discomfort. I advised that Dr. Acie Fredrickson thinks a cardiac cath should be done and he verbalized agreement. I asked if he would like to see Dr. Acie Fredrickson on Wed. 11/25 or go ahead and set up the cath for this week. He states he cannot do it this week but is agreeable to schedule for next week. I asked about a caregiver to be with him after the procedure and he states his family is in [place] and he does not have a friend, relative, or neighbor to drive him home from the hospital. I advised that we can discuss possible admission and that we will keep appointment with Dr. Acie Fredrickson for 11/25 for further discussion. Patient verbalized understanding and agreement with plan and thanked me for the call.

## 2019-11-20 ENCOUNTER — Ambulatory Visit (INDEPENDENT_AMBULATORY_CARE_PROVIDER_SITE_OTHER): Payer: Medicare Other | Admitting: Cardiovascular Disease

## 2019-11-20 ENCOUNTER — Other Ambulatory Visit: Payer: Self-pay

## 2019-11-20 ENCOUNTER — Encounter: Payer: Self-pay | Admitting: Cardiovascular Disease

## 2019-11-20 VITALS — BP 124/66 | HR 60 | Ht 67.0 in | Wt 171.0 lb

## 2019-11-20 DIAGNOSIS — I2510 Atherosclerotic heart disease of native coronary artery without angina pectoris: Secondary | ICD-10-CM

## 2019-11-20 DIAGNOSIS — I25110 Atherosclerotic heart disease of native coronary artery with unstable angina pectoris: Secondary | ICD-10-CM

## 2019-11-20 MED ORDER — NITROGLYCERIN 0.4 MG SL SUBL: 0.4000 mg | SUBLINGUAL_TABLET | SUBLINGUAL | 6 refills | Status: DC | PRN

## 2019-11-20 MED ORDER — ASPIRIN EC 81 MG PO TBEC: 81.0000 mg | DELAYED_RELEASE_TABLET | Freq: Every day | ORAL | Status: AC

## 2019-11-20 MED ORDER — ISOSORBIDE MONONITRATE ER 30 MG PO TB24: 30.0000 mg | ORAL_TABLET | Freq: Every day | ORAL | 3 refills | Status: DC

## 2019-11-20 NOTE — Progress Notes (Signed)
Cardiology Office Note   Date:  11/20/2019   ID:  Collin [name], DOB 1925-03-07, MRN [id number]  PCP:  Binnie Rail, MD  Cardiologist:  Mertie Moores, MD , previous patient of Dr. Ron Parker  No chief complaint on file.  Problem List 1. CAD- s/p stenting in 2005.  2. Essential HTN       Collin [name] is a 85 y.o. male who presents for follow-up of coronary disease.  Had a stent placed by Dr. Olevia Perches in 2005.  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   Goes bowling twice a week Took Atorvastatin for years but stopped due to leg pain .  Last lipids were drawn in Dec.  - showed markedly elevated Trig levels and high chol levels.   April 21, 2017:  Collin [name] has a hx of CAD Hyperlipidemia Was recently diagnosed with DM and He has greatly improved his diet. He's cut back on lots of carbohydrates. He's lost about 5 pounds. He's noticed that his blood pressure has been low. This morning he took only half of his valsartan tablet.  Takes SBE prophylasix - has done so for years.  No prosthetic valve and no artificial joints  Jan. 8, 2019:  Collin [name] is seen back for follow-up visit for his coronary artery disease and diabetes mellitus.   He walks on the treadmill 4-5 times a week for 30 minutes at a time.  He denies any angina while walking the treadmill.  He still bowls once or twice a week.  He notes that his blood pressure has been somewhat variable.  It seems to go up in the evenings.  Eats prepared meals every meal.      July 24, 2018:    Collin [name] is seen today for follow up of his CAD Has pain in his calves with walking -  Resolves with walking  Had a [employer] screening of PAD screening  No CP  Still works out on the treadmill, bowl one a week   Recent lab work shows that his triglyceride level is markedly elevated at 613.  Total cholesterol is 179.  His LDL is 77.  Eats lots of cereal, through the day .     January 21, 2019:  Strained his chest recently while lifting a large box Has had some shortness of breath  Saw his primary   Echo shows a mildly depressed left ventricular systolic function with an ejection fraction of 45 to 50%.  There is hypokinesis of the inferior wall.  He has grade 1 diastolic dysfunction.  He has mild mitral regurgitation. The pain was clearly MSK.    Feeling better.   Breathing seems to be better .   November 20, 2019:   Collin [name] is seen today for a follow-up visit. He was recently seen by [name], PA with symptoms consistent with unstable angina.  He wanted to discuss it further and is seeing me today for further evaluation.  Pain / heaviness  - center of chest  Worse with walking or carrying groceries from car Had a stent in 2005.  This current pain is similar to his angina .  No diaphoresis,  No syncope.  No presyncope  Last for several minutes.  Or until he stops to rest  Was raking leaves for 20 min and had angina   Past Medical  History:  Diagnosis Date  . Allergy   . Anemia   . Arthritis    gout  . Bladder cancer ([place])   . BPH (benign prostatic hyperplasia)   . CAD (coronary artery disease) 2005   DES January 2 005, residual disease  / Myoview  . Cancer ([place]) 02/28/2001   Left ureter cancer/bladder  . Carcinoma, renal cell ([place])   . Carotid artery disease ([place])    Doppler, September, 2010, normal, no carotid artery disease  . COPD (chronic obstructive pulmonary disease) ([place])   . Dysfunction of eustachian tube   . Ejection fraction    EF 55%, echo, January, 2009  . Encounter for long-term (current) use of other medications   . Enlarged prostate   . GERD (gastroesophageal reflux disease)   . Gynecomastia    b/l  . HTN (hypertension)   . Hx of cystoscopy 11/02/12   negative  . Hx of skin cancer, basal cell   . Hypercholesterolemia   . Hyperlipidemia   . Low back pain syndrome   .  Myocardial infarction ([place])    hx acute  . Nocturia   . Organic impotence   . Pulmonary nodule    Chest CT, followed  . Reactive airway disease   . 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 Staph infection 04/1997  . Thrombocytopenia ([place])     Past Surgical History:  Procedure Laterality Date  . BACK SURGERY  02/1996  . bladder tumors  2003, 2004, 01/2009   removed  . CARDIAC CATHETERIZATION    . CHOLECYSTECTOMY  07/2003  . 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 NEPHRECTOMY  02/2001   left kidney, ureter  . osteomyelitis     staph aureus  . URETERECTOMY  2003   for cancer    Patient Active Problem List   Diagnosis Date Noted  . CAD (coronary artery disease)     Priority: High  . Essential hypertension 04/23/2008    Priority: High  . Dyslipidemia 01/25/2007    Priority: High  . Shortness of breath 11/01/2019  . Chest pressure 11/01/2019  . Chest tightness 01/07/2019  . Thrombocytopenia ([place])   . Skin cancer   . Pulmonary nodule   . Organic impotence   . Nocturia   . Myocardial infarction ([place])   . Low back pain syndrome   . COPD (chronic obstructive pulmonary disease) ([place])   . BPH (benign prostatic hyperplasia)   . Bladder cancer ([place])   . Arthritis   . Chronic kidney disease (CKD) 07/10/2017  . Diabetes ([place]) 04/15/2017  . Fatty liver 06/18/2016  . Rash and nonspecific skin eruption 06/13/2016  . LUQ fullness 06/13/2016  . H/O bacterial endocarditis 04/06/2016  . Myalgia and myositis 12/18/2015  . Abnormal CT of the abdomen 12/31/2013  . Abnormal chest x-ray 12/31/2013  . Gynecomastia   . Spinal stenosis, lumbar 05/12/2012  . Carotid artery disease ([place])   . CERVICAL RADICULOPATHY, LEFT 12/07/2009  . SKIN CANCER, HX OF 12/07/2009  . Renal cell carcinoma ([place]) 07/18/2009  . GERD 07/18/2009  . Cancer ([place]) 02/28/2001      Current Outpatient Medications  Medication Sig Dispense Refill   . acetaminophen (TYLENOL) 500 MG tablet Take 500 mg by mouth every 6 (six) hours as needed.    Marland Kitchen alfuzosin (UROXATRAL) 10 MG 24 hr tablet Take 10 mg by mouth daily.     Marland Kitchen  carvedilol (COREG) 3.125 MG tablet Take 1 tablet (3.125 mg total) by mouth 2 (two) times daily. 180 tablet 3  . clopidogrel (PLAVIX) 75 MG tablet TAKE 1 TABLET DAILY 90 tablet 1  . fenofibrate (TRICOR) 48 MG tablet TAKE 1 TABLET DAILY 90 tablet 3  . finasteride (PROSCAR) 5 MG tablet Take 5 mg by mouth daily.    Marland Kitchen losartan (COZAAR) 50 MG tablet Take 1 tablet (50 mg total) by mouth daily. Please make yearly appt with Dr. Acie Fredrickson for January before anymore refills. 1st attempt 90 tablet 0  . Multiple Vitamin (MULTIVITAMIN) tablet Take 1 tablet by mouth daily.      Marland Kitchen triamcinolone ointment (KENALOG) 0.1 % Apply 1 application topically 2 (two) times daily.     No current facility-administered medications for this visit.     Allergies:   Atorvastatin, Peanut oil, and Trandolapril    Social History:  The patient  reports that he quit smoking about 27 years ago. His smoking use included pipe. He has quit using smokeless tobacco. He reports that he does not drink alcohol or use drugs.   Family History:  The patient's family history includes Coronary artery disease in his brother; Diabetes in his brother, mother, sister, and sister; Drug abuse in his brother; Heart failure (age of onset: 59) in his mother.    ROS:  Please see the history of present illness.  Patient denies fever, chills, headache, sweats, rash, change in vision, change in hearing, chest pain, cough, nausea or vomiting, urinary symptoms. All other systems are reviewed and are negative.     Physical Exam: Blood pressure 124/66, pulse 60, height 5\' 7"  (1.702 m), weight 171 lb (77.6 kg), SpO2 96 %.  GEN:   Elderly gentleman, no acute distress HEENT: Normal NECK: No JVD; No carotid bruits LYMPHATICS: No lymphadenopathy CARDIAC: RRR , no murmurs, rubs, gallops  RESPIRATORY:  Clear to auscultation without rales, wheezing or rhonchi  ABDOMEN: Soft, non-tender, non-distended MUSCULOSKELETAL:  No edema; No deformity  SKIN: Warm and dry NEUROLOGIC:  Alert and oriented x 3   EKG:     Recent Labs: 07/08/2019: TSH 3.40 11/01/2019: ALT 15; BUN 35; Creatinine, Ser 1.82; Hemoglobin 14.1; Platelets 128.0; Potassium 4.7; Pro B Natriuretic peptide (BNP) 31.0; Sodium 139    Lipid Panel    Component Value Date/Time   CHOL 180 07/08/2019 1421   TRIG 210.0 (H) 07/08/2019 1421   HDL 41.50 07/08/2019 1421   CHOLHDL 4 07/08/2019 1421   VLDL 42.0 (H) 07/08/2019 1421   LDLCALC 49 02/02/2015 0841   LDLDIRECT 107.0 07/08/2019 1421      Wt Readings from Last 3 Encounters:  11/20/19 171 lb (77.6 kg)  11/05/19 166 lb 12.8 oz (75.7 kg)  11/01/19 165 lb (74.8 kg)      Current medicines are reviewed. The patient has a very good understanding of his medications. No changes are to be made.       ASSESSMENT AND PLAN:  1. CAD-Collin [name] presents today with symptoms that are consistent with unstable angina.  He has exertional chest tightness and fullness.  This occurs with minimal exertion.  He stated his symptoms are very similar to his previous episodes of angina.  We will schedule him for heart catheterization next week.  We started him on isosorbide 30 mg a day and aspirin 81 mg a day.  We will also give him new nitroglycerin.  He has moderate CKD so we will have him hold his losartan for 2  days prior to his heart catheterization and he will need to show up several hours early to have IV hydration.  2. Essential HTN-blood pressure is well controlled..  3. Hyperlipidemia -    stable.  4.  Possible claudication:      Mertie Moores, MD  11/20/2019 2:47 PM    [place],  [place], [place]  [address] Pager [phone] Phone: [phone]; Fax: [phone]

## 2019-11-20 NOTE — Patient Instructions (Addendum)
Medication Instructions:  Your physician has recommended you make the following change in your medication:  1) Start taking Imdur (isosorbide mononitrate) 30 mg daily  2) Start taking Nitroglycerin 0.4 mg as needed 3) Start taking  Aspirin 81 mg daily  *If you need a refill on your cardiac medications before your next appointment, please call your pharmacy*  Lab Work: TODAY: BMET and CBC If you have labs (blood work) drawn today and your tests are completely normal, you will receive your results only by: Marland Kitchen MyChart Message (if you have MyChart) OR . A paper copy in the mail If you have any lab test that is abnormal or we need to change your treatment, we will call you to review the results.  Testing/Procedures: Your physician has requested that you have a cardiac catheterization. Cardiac catheterization is used to diagnose and/or treat various heart conditions. Doctors may recommend this procedure for a number of different reasons. The most common reason is to evaluate chest pain. Chest pain can be a symptom of coronary artery disease (CAD), and cardiac catheterization can show whether plaque is narrowing or blocking your heart's arteries. This procedure is also used to evaluate the valves, as well as measure the blood flow and oxygen levels in different parts of your heart. For further information please visit [email or web address]. Please follow instruction sheet, as given.   Follow-Up: At [facility], you and your health needs are our priority.  As part of our continuing mission to provide you with exceptional heart care, we have created designated Provider Care Teams.  These Care Teams include your primary Cardiologist (physician) and Advanced Practice Providers (APPs -  Physician Assistants and Nurse Practitioners) who all work together to provide you with the care you need, when you need it.  Your next appointment:   January 27th, 2021 at 10:40AM  The format for your next appointment:    In Person  Provider:   Mertie Moores, MD  Other Instructions     [facility] [place], [address] [place] [address] Dept: [phone] Loc: [place]  11/20/2019  You are scheduled for a Cardiac Catheterization on Wednesday, December 2 with Dr. [name].  1. Please arrive at the [facility] (Main Entrance A) at [facility]: [address] [place], [place] [address] at 8:00 AM. Free valet parking service is available.   Special note: Every effort is made to have your procedure done on time. Please understand that emergencies sometimes delay scheduled procedures.  2. Diet: Do not eat solid foods after midnight.  The patient may have clear liquids until 5am upon the day of the procedure.  3. Labs: You will need to have blood drawn Today. You do not need to be fasting.  Your Pre-procedure COVID-19 Testing will be done on Sat. Nov. 28 at 12:00 pm at [facility] at [address], [place], [place] [address]. Once you arrive at the testing site, stay in the right hand lane, go under the building overhang not the tent. If you are tested under the tent your results may not be back before your procedure. Please be on time for your appointment.  After your swab you will be given a mask to wear and instructed to go home and quarantine/no visitors until after your procedure. If you test positive you will be notified and your procedure will be cancelled.  4. Medication instructions in preparation for your procedure:   Contrast Allergy: No  Stop taking, Cozaar (Losartan) after your dose on Monday, November 25, 2019.   On the morning of your procedure, take your Aspirin and Plavix and any morning medicines NOT listed above.  You may use sips of water.  5. Plan for one night stay--bring personal belongings. 6. Bring a current  list of your medications and current insurance cards. 7. You MUST have a responsible person to drive you home. 8. Someone MUST be with you the first 24 hours after you arrive home or your discharge will be delayed. 9. Please wear clothes that are easy to get on and off and wear slip-on shoes.  Thank you for allowing [place] to care for you!   -- [facility]

## 2019-11-20 NOTE — H&P (View-Only) (Signed)
Cardiology Office Note   Date:  11/20/2019   ID:  Collin [name], DOB [birth year], MRN [id number]  PCP:  Binnie Rail, MD  Cardiologist:  Mertie Moores, MD , previous patient of Dr. Ron Parker  No chief complaint on file.  Problem List 1. CAD- s/p stenting in 2005.  2. Essential HTN       Collin [name] is a 85 y.o. male who presents for follow-up of coronary disease.  Had a stent placed by Dr. Olevia Perches in 2005.  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   Goes bowling twice a week Took Atorvastatin for years but stopped due to leg pain .  Last lipids were drawn in Dec.  - showed markedly elevated Trig levels and high chol levels.   April 21, 2017:  Collin [name] has a hx of CAD Hyperlipidemia Was recently diagnosed with DM and He has greatly improved his diet. He's cut back on lots of carbohydrates. He's lost about 5 pounds. He's noticed that his blood pressure has been low. This morning he took only half of his valsartan tablet.  Takes SBE prophylasix - has done so for years.  No prosthetic valve and no artificial joints  Jan. 8, 2019:  Collin [name] is seen back for follow-up visit for his coronary artery disease and diabetes mellitus.   He walks on the treadmill 4-5 times a week for 30 minutes at a time.  He denies any angina while walking the treadmill.  He still bowls once or twice a week.  He notes that his blood pressure has been somewhat variable.  It seems to go up in the evenings.  Eats prepared meals every meal.      July 24, 2018:    Collin [name] is seen today for follow up of his CAD Has pain in his calves with walking -  Resolves with walking  Had a [employer] screening of PAD screening  No CP  Still works out on the treadmill, bowl one a week   Recent lab work shows that his triglyceride level is markedly elevated at 613.  Total cholesterol is 179.  His LDL is 77.  Eats lots of cereal, through the day .     January 21, 2019:  Strained his chest recently while lifting a large box Has had some shortness of breath  Saw his primary   Echo shows a mildly depressed left ventricular systolic function with an ejection fraction of 45 to 50%.  There is hypokinesis of the inferior wall.  He has grade 1 diastolic dysfunction.  He has mild mitral regurgitation. The pain was clearly MSK.    Feeling better.   Breathing seems to be better .   November 20, 2019:   Collin [name] is seen today for a follow-up visit. He was recently seen by [name], PA with symptoms consistent with unstable angina.  He wanted to discuss it further and is seeing me today for further evaluation.  Pain / heaviness  - center of chest  Worse with walking or carrying groceries from car Had a stent in 2005.  This current pain is similar to his angina .  No diaphoresis,  No syncope.  No presyncope  Last for several minutes.  Or until he stops to rest  Was raking leaves for 20 min and had angina   Past Medical  History:  Diagnosis Date  . Allergy   . Anemia   . Arthritis    gout  . Bladder cancer ([place])   . BPH (benign prostatic hyperplasia)   . CAD (coronary artery disease) 2005   DES January 2 005, residual disease  / Myoview  . Cancer ([place]) 02/28/2001   Left ureter cancer/bladder  . Carcinoma, renal cell ([place])   . Carotid artery disease ([place])    Doppler, September, 2010, normal, no carotid artery disease  . COPD (chronic obstructive pulmonary disease) ([place])   . Dysfunction of eustachian tube   . Ejection fraction    EF 55%, echo, January, 2009  . Encounter for long-term (current) use of other medications   . Enlarged prostate   . GERD (gastroesophageal reflux disease)   . Gynecomastia    b/l  . HTN (hypertension)   . Hx of cystoscopy 11/02/12   negative  . Hx of skin cancer, basal cell   . Hypercholesterolemia   . Hyperlipidemia   . Low back pain syndrome   .  Myocardial infarction ([place])    hx acute  . Nocturia   . Organic impotence   . Pulmonary nodule    Chest CT, followed  . Reactive airway disease   . 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 Staph infection 04/1997  . Thrombocytopenia ([place])     Past Surgical History:  Procedure Laterality Date  . BACK SURGERY  02/1996  . bladder tumors  2003, 2004, 01/2009   removed  . CARDIAC CATHETERIZATION    . CHOLECYSTECTOMY  07/2003  . 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 NEPHRECTOMY  02/2001   left kidney, ureter  . osteomyelitis     staph aureus  . URETERECTOMY  2003   for cancer    Patient Active Problem List   Diagnosis Date Noted  . CAD (coronary artery disease)     Priority: High  . Essential hypertension 04/23/2008    Priority: High  . Dyslipidemia 01/25/2007    Priority: High  . Shortness of breath 11/01/2019  . Chest pressure 11/01/2019  . Chest tightness 01/07/2019  . Thrombocytopenia ([place])   . Skin cancer   . Pulmonary nodule   . Organic impotence   . Nocturia   . Myocardial infarction ([place])   . Low back pain syndrome   . COPD (chronic obstructive pulmonary disease) ([place])   . BPH (benign prostatic hyperplasia)   . Bladder cancer ([place])   . Arthritis   . Chronic kidney disease (CKD) 07/10/2017  . Diabetes ([place]) 04/15/2017  . Fatty liver 06/18/2016  . Rash and nonspecific skin eruption 06/13/2016  . LUQ fullness 06/13/2016  . H/O bacterial endocarditis 04/06/2016  . Myalgia and myositis 12/18/2015  . Abnormal CT of the abdomen 12/31/2013  . Abnormal chest x-ray 12/31/2013  . Gynecomastia   . Spinal stenosis, lumbar 05/12/2012  . Carotid artery disease ([place])   . CERVICAL RADICULOPATHY, LEFT 12/07/2009  . SKIN CANCER, HX OF 12/07/2009  . Renal cell carcinoma ([place]) 07/18/2009  . GERD 07/18/2009  . Cancer ([place]) 02/28/2001      Current Outpatient Medications  Medication Sig Dispense Refill   . acetaminophen (TYLENOL) 500 MG tablet Take 500 mg by mouth every 6 (six) hours as needed.    Marland Kitchen alfuzosin (UROXATRAL) 10 MG 24 hr tablet Take 10 mg by mouth daily.     Marland Kitchen  carvedilol (COREG) 3.125 MG tablet Take 1 tablet (3.125 mg total) by mouth 2 (two) times daily. 180 tablet 3  . clopidogrel (PLAVIX) 75 MG tablet TAKE 1 TABLET DAILY 90 tablet 1  . fenofibrate (TRICOR) 48 MG tablet TAKE 1 TABLET DAILY 90 tablet 3  . finasteride (PROSCAR) 5 MG tablet Take 5 mg by mouth daily.    Marland Kitchen losartan (COZAAR) 50 MG tablet Take 1 tablet (50 mg total) by mouth daily. Please make yearly appt with Dr. Acie Fredrickson for January before anymore refills. 1st attempt 90 tablet 0  . Multiple Vitamin (MULTIVITAMIN) tablet Take 1 tablet by mouth daily.      Marland Kitchen triamcinolone ointment (KENALOG) 0.1 % Apply 1 application topically 2 (two) times daily.     No current facility-administered medications for this visit.     Allergies:   Atorvastatin, Peanut oil, and Trandolapril    Social History:  The patient  reports that he quit smoking about 27 years ago. His smoking use included pipe. He has quit using smokeless tobacco. He reports that he does not drink alcohol or use drugs.   Family History:  The patient's family history includes Coronary artery disease in his brother; Diabetes in his brother, mother, sister, and sister; Drug abuse in his brother; Heart failure (age of onset: 47) in his mother.    ROS:  Please see the history of present illness.  Patient denies fever, chills, headache, sweats, rash, change in vision, change in hearing, chest pain, cough, nausea or vomiting, urinary symptoms. All other systems are reviewed and are negative.     Physical Exam: Blood pressure 124/66, pulse 60, height 5\' 7"  (1.702 m), weight 171 lb (77.6 kg), SpO2 96 %.  GEN:   Elderly gentleman, no acute distress HEENT: Normal NECK: No JVD; No carotid bruits LYMPHATICS: No lymphadenopathy CARDIAC: RRR , no murmurs, rubs, gallops  RESPIRATORY:  Clear to auscultation without rales, wheezing or rhonchi  ABDOMEN: Soft, non-tender, non-distended MUSCULOSKELETAL:  No edema; No deformity  SKIN: Warm and dry NEUROLOGIC:  Alert and oriented x 3   EKG:     Recent Labs: 07/08/2019: TSH 3.40 11/01/2019: ALT 15; BUN 35; Creatinine, Ser 1.82; Hemoglobin 14.1; Platelets 128.0; Potassium 4.7; Pro B Natriuretic peptide (BNP) 31.0; Sodium 139    Lipid Panel    Component Value Date/Time   CHOL 180 07/08/2019 1421   TRIG 210.0 (H) 07/08/2019 1421   HDL 41.50 07/08/2019 1421   CHOLHDL 4 07/08/2019 1421   VLDL 42.0 (H) 07/08/2019 1421   LDLCALC 49 02/02/2015 0841   LDLDIRECT 107.0 07/08/2019 1421      Wt Readings from Last 3 Encounters:  11/20/19 171 lb (77.6 kg)  11/05/19 166 lb 12.8 oz (75.7 kg)  11/01/19 165 lb (74.8 kg)      Current medicines are reviewed. The patient has a very good understanding of his medications. No changes are to be made.       ASSESSMENT AND PLAN:  1. CAD-Collin [name] presents today with symptoms that are consistent with unstable angina.  He has exertional chest tightness and fullness.  This occurs with minimal exertion.  He stated his symptoms are very similar to his previous episodes of angina.  We will schedule him for heart catheterization next week.  We started him on isosorbide 30 mg a day and aspirin 81 mg a day.  We will also give him new nitroglycerin.  He has moderate CKD so we will have him hold his losartan for 2  days prior to his heart catheterization and he will need to show up several hours early to have IV hydration.  2. Essential HTN-blood pressure is well controlled..  3. Hyperlipidemia -    stable.  4.  Possible claudication:      Mertie Moores, MD  11/20/2019 2:47 PM    [place] [address],  [place], [place]  [address] Pager [phone] Phone: [phone]; Fax: [phone]

## 2019-11-21 LAB — CBC
Hematocrit: 38.2 % (ref 37.5–51.0)
Hemoglobin: 12.9 g/dL — ABNORMAL LOW (ref 13.0–17.7)
MCH: 31 pg (ref 26.6–33.0)
MCHC: 33.8 g/dL (ref 31.5–35.7)
MCV: 92 fL (ref 79–97)
Platelets: 129 10*3/uL — ABNORMAL LOW (ref 150–450)
RBC: 4.16 x10E6/uL (ref 4.14–5.80)
RDW: 13.1 % (ref 11.6–15.4)
WBC: 4.5 10*3/uL (ref 3.4–10.8)

## 2019-11-21 LAB — BASIC METABOLIC PANEL
BUN/Creatinine Ratio: 17 (ref 10–24)
BUN: 27 mg/dL (ref 10–36)
CO2: 22 mmol/L (ref 20–29)
Calcium: 9.2 mg/dL (ref 8.6–10.2)
Chloride: 101 mmol/L (ref 96–106)
Creatinine, Ser: 1.6 mg/dL — ABNORMAL HIGH (ref 0.76–1.27)
GFR calc Af Amer: 43 mL/min/{1.73_m2} — ABNORMAL LOW (ref >59)
GFR calc non Af Amer: 37 mL/min/{1.73_m2} — ABNORMAL LOW (ref >59)
Glucose: 90 mg/dL (ref 65–99)
Potassium: 4.8 mmol/L (ref 3.5–5.2)
Sodium: 138 mmol/L (ref 134–144)

## 2019-11-23 ENCOUNTER — Other Ambulatory Visit (HOSPITAL_COMMUNITY)
Admission: RE | Admit: 2019-11-23 | Discharge: 2019-11-23 | Disposition: A | Discharge status: Home / Self Care | Payer: Medicare Other | Source: Ambulatory Visit | Attending: Cardiovascular Disease | Admitting: Cardiovascular Disease

## 2019-11-23 DIAGNOSIS — Z20828 Contact with and (suspected) exposure to other viral communicable diseases: Secondary | ICD-10-CM | POA: Insufficient documentation

## 2019-11-23 DIAGNOSIS — Z01812 Encounter for preprocedural laboratory examination: Secondary | ICD-10-CM | POA: Diagnosis present

## 2019-11-24 ENCOUNTER — Other Ambulatory Visit: Payer: Self-pay | Admitting: Cardiovascular Disease

## 2019-11-24 DIAGNOSIS — I200 Unstable angina: Secondary | ICD-10-CM

## 2019-11-24 LAB — NOVEL CORONAVIRUS, NAA (HOSP ORDER, SEND-OUT TO REF LAB; TAT 18-24 HRS): SARS-CoV-2, NAA: NOT DETECTED

## 2019-11-26 ENCOUNTER — Telehealth: Payer: Self-pay | Admitting: *Deleted

## 2019-11-26 NOTE — Telephone Encounter (Signed)
Pt contacted pre-catheterization scheduled at [facility] for: Wednesday November 27, 2019 1 PM Verified arrival time and place: [facility]) at: 8 AM-pre procedure hydration   No solid food after midnight prior to cath, clear liquids until 5 AM day of procedure. Contrast allergy: no  Hold: Losartan-day before and day of procedure-GFR 37  Except hold medications AM meds can be  taken pre-cath with sip of water including: ASA 81 mg Plavix 75 mg  Confirmed patient has responsible adult to drive home post procedure and observe 24 hours after arriving home: yes  Currently, due to Covid-19 pandemic, only one support person will be allowed with patient. Must be the same support person for that patient's entire stay, will be screened and required to wear a mask. They will be asked to wait in the waiting room for the duration of the patient's stay.  Patients are required to wear a mask when they enter the hospital.     COVID-19 Pre-Screening Questions:  . In the past 7 to 10 days have you had a cough,  shortness of breath, headache, congestion, fever (100 or greater) body aches, chills, sore throat, or sudden loss of taste or sense of smell? no . Have you been around anyone with known Covid 19? no . Have you been around anyone who is awaiting Covid 19 test results in the past 7 to 10 days? no . Have you been around anyone who has been exposed to Covid 19, or has mentioned symptoms of Covid 19 within the past 7 to 10 days? no  I reviewed procedure/mask/visitor instructions, Covid-19 screening questions with patient, he verbalized understanding, thanked me for call.

## 2019-11-27 ENCOUNTER — Other Ambulatory Visit: Payer: Self-pay

## 2019-11-27 ENCOUNTER — Inpatient Hospital Stay (HOSPITAL_COMMUNITY)
Admission: AD | Admit: 2019-11-27 | Discharge: 2019-11-29 | DRG: 247 | Disposition: A | Discharge status: Home / Self Care | Payer: Medicare Other | Attending: Cardiovascular Disease | Admitting: Cardiovascular Disease

## 2019-11-27 ENCOUNTER — Encounter (HOSPITAL_COMMUNITY)
Admission: AD | Disposition: A | Discharge status: Home / Self Care | Payer: Medicare Other | Source: Home / Self Care | Attending: Cardiovascular Disease

## 2019-11-27 DIAGNOSIS — E785 Hyperlipidemia, unspecified: Secondary | ICD-10-CM | POA: Diagnosis present

## 2019-11-27 DIAGNOSIS — I129 Hypertensive chronic kidney disease with stage 1 through stage 4 chronic kidney disease, or unspecified chronic kidney disease: Secondary | ICD-10-CM | POA: Diagnosis present

## 2019-11-27 DIAGNOSIS — Z85828 Personal history of other malignant neoplasm of skin: Secondary | ICD-10-CM

## 2019-11-27 DIAGNOSIS — E1159 Type 2 diabetes mellitus with other circulatory complications: Secondary | ICD-10-CM | POA: Diagnosis present

## 2019-11-27 DIAGNOSIS — N400 Enlarged prostate without lower urinary tract symptoms: Secondary | ICD-10-CM | POA: Diagnosis present

## 2019-11-27 DIAGNOSIS — D649 Anemia, unspecified: Secondary | ICD-10-CM

## 2019-11-27 DIAGNOSIS — N184 Chronic kidney disease, stage 4 (severe): Secondary | ICD-10-CM | POA: Diagnosis present

## 2019-11-27 DIAGNOSIS — Z905 Acquired absence of kidney: Secondary | ICD-10-CM | POA: Diagnosis not present

## 2019-11-27 DIAGNOSIS — Z833 Family history of diabetes mellitus: Secondary | ICD-10-CM

## 2019-11-27 DIAGNOSIS — N62 Hypertrophy of breast: Secondary | ICD-10-CM | POA: Diagnosis present

## 2019-11-27 DIAGNOSIS — Z955 Presence of coronary angioplasty implant and graft: Secondary | ICD-10-CM | POA: Diagnosis not present

## 2019-11-27 DIAGNOSIS — M1990 Unspecified osteoarthritis, unspecified site: Secondary | ICD-10-CM | POA: Diagnosis present

## 2019-11-27 DIAGNOSIS — Z79899 Other long term (current) drug therapy: Secondary | ICD-10-CM

## 2019-11-27 DIAGNOSIS — Z888 Allergy status to other drugs, medicaments and biological substances status: Secondary | ICD-10-CM

## 2019-11-27 DIAGNOSIS — I200 Unstable angina: Secondary | ICD-10-CM | POA: Diagnosis not present

## 2019-11-27 DIAGNOSIS — E1169 Type 2 diabetes mellitus with other specified complication: Secondary | ICD-10-CM | POA: Diagnosis present

## 2019-11-27 DIAGNOSIS — J449 Chronic obstructive pulmonary disease, unspecified: Secondary | ICD-10-CM | POA: Diagnosis present

## 2019-11-27 DIAGNOSIS — R079 Chest pain, unspecified: Secondary | ICD-10-CM | POA: Diagnosis present

## 2019-11-27 DIAGNOSIS — Z8249 Family history of ischemic heart disease and other diseases of the circulatory system: Secondary | ICD-10-CM | POA: Diagnosis not present

## 2019-11-27 DIAGNOSIS — I25110 Atherosclerotic heart disease of native coronary artery with unstable angina pectoris: Secondary | ICD-10-CM | POA: Diagnosis not present

## 2019-11-27 DIAGNOSIS — I2510 Atherosclerotic heart disease of native coronary artery without angina pectoris: Secondary | ICD-10-CM | POA: Diagnosis present

## 2019-11-27 DIAGNOSIS — D631 Anemia in chronic kidney disease: Secondary | ICD-10-CM | POA: Diagnosis present

## 2019-11-27 DIAGNOSIS — D696 Thrombocytopenia, unspecified: Secondary | ICD-10-CM | POA: Diagnosis present

## 2019-11-27 DIAGNOSIS — N1832 Chronic kidney disease, stage 3b: Secondary | ICD-10-CM | POA: Diagnosis not present

## 2019-11-27 DIAGNOSIS — Z7902 Long term (current) use of antithrombotics/antiplatelets: Secondary | ICD-10-CM | POA: Diagnosis not present

## 2019-11-27 DIAGNOSIS — K219 Gastro-esophageal reflux disease without esophagitis: Secondary | ICD-10-CM | POA: Diagnosis present

## 2019-11-27 DIAGNOSIS — E782 Mixed hyperlipidemia: Secondary | ICD-10-CM | POA: Diagnosis not present

## 2019-11-27 DIAGNOSIS — N183 Chronic kidney disease, stage 3 unspecified: Secondary | ICD-10-CM | POA: Diagnosis present

## 2019-11-27 DIAGNOSIS — Z91010 Allergy to peanuts: Secondary | ICD-10-CM

## 2019-11-27 DIAGNOSIS — Z8551 Personal history of malignant neoplasm of bladder: Secondary | ICD-10-CM

## 2019-11-27 DIAGNOSIS — I25119 Atherosclerotic heart disease of native coronary artery with unspecified angina pectoris: Secondary | ICD-10-CM | POA: Diagnosis present

## 2019-11-27 DIAGNOSIS — I10 Essential (primary) hypertension: Secondary | ICD-10-CM | POA: Diagnosis present

## 2019-11-27 DIAGNOSIS — Z87891 Personal history of nicotine dependence: Secondary | ICD-10-CM

## 2019-11-27 DIAGNOSIS — N179 Acute kidney failure, unspecified: Secondary | ICD-10-CM | POA: Diagnosis not present

## 2019-11-27 HISTORY — DX: Chronic kidney disease, stage 3 unspecified: N18.30

## 2019-11-27 HISTORY — PX: LEFT HEART CATH AND CORONARY ANGIOGRAPHY: CATH118249

## 2019-11-27 HISTORY — DX: Cardiomyopathy, unspecified: I42.9

## 2019-11-27 SURGERY — LEFT HEART CATH AND CORONARY ANGIOGRAPHY
Anesthesia: LOCAL

## 2019-11-27 MED ORDER — HEPARIN (PORCINE) IN NACL 1000-0.9 UT/500ML-% IV SOLN
INTRAVENOUS | Status: DC | PRN
  Administered 2019-11-27 (×3): 500 mL

## 2019-11-27 MED ORDER — LABETALOL HCL 5 MG/ML IV SOLN: 10.0000 mg | INTRAVENOUS | Status: AC | PRN

## 2019-11-27 MED ORDER — SODIUM CHLORIDE 0.9 % WEIGHT BASED INFUSION: 1.0000 mL/kg/h | INTRAVENOUS | Status: DC

## 2019-11-27 MED ORDER — MIDAZOLAM HCL 2 MG/2ML IJ SOLN
INTRAMUSCULAR | Status: DC | PRN
  Administered 2019-11-27: 1 mg via INTRAVENOUS

## 2019-11-27 MED ORDER — CARVEDILOL 3.125 MG PO TABS
3.1250 mg | ORAL_TABLET | Freq: Two times a day (BID) | ORAL | Status: DC
  Administered 2019-11-27 – 2019-11-29 (×4): 3.125 mg via ORAL
  Filled 2019-11-27 (×4): qty 1

## 2019-11-27 MED ORDER — SODIUM CHLORIDE 0.9% FLUSH
3.0000 mL | Freq: Two times a day (BID) | INTRAVENOUS | Status: DC
  Administered 2019-11-27 – 2019-11-29 (×4): 3 mL via INTRAVENOUS

## 2019-11-27 MED ORDER — FENTANYL CITRATE (PF) 100 MCG/2ML IJ SOLN
INTRAMUSCULAR | Status: DC | PRN
  Administered 2019-11-27: 25 ug via INTRAVENOUS

## 2019-11-27 MED ORDER — HEPARIN SODIUM (PORCINE) 1000 UNIT/ML IJ SOLN
INTRAMUSCULAR | Status: DC | PRN
  Administered 2019-11-27: 4000 [IU] via INTRAVENOUS

## 2019-11-27 MED ORDER — ALFUZOSIN HCL ER 10 MG PO TB24
10.0000 mg | ORAL_TABLET | Freq: Every day | ORAL | Status: DC
  Administered 2019-11-28 – 2019-11-29 (×2): 10 mg via ORAL
  Filled 2019-11-27 (×2): qty 1

## 2019-11-27 MED ORDER — SODIUM CHLORIDE 0.9 % IV SOLN: 250.0000 mL | INTRAVENOUS | Status: DC | PRN

## 2019-11-27 MED ORDER — LIDOCAINE HCL (PF) 1 % IJ SOLN
INTRAMUSCULAR | Status: AC
  Filled 2019-11-27: qty 30

## 2019-11-27 MED ORDER — FINASTERIDE 5 MG PO TABS
5.0000 mg | ORAL_TABLET | Freq: Every day | ORAL | Status: DC
  Administered 2019-11-28 – 2019-11-29 (×2): 5 mg via ORAL
  Filled 2019-11-27 (×2): qty 1

## 2019-11-27 MED ORDER — HEPARIN SODIUM (PORCINE) 1000 UNIT/ML IJ SOLN
INTRAMUSCULAR | Status: AC
  Filled 2019-11-27: qty 1

## 2019-11-27 MED ORDER — FENTANYL CITRATE (PF) 100 MCG/2ML IJ SOLN
INTRAMUSCULAR | Status: AC
  Filled 2019-11-27: qty 2

## 2019-11-27 MED ORDER — NITROGLYCERIN 0.4 MG SL SUBL: 0.4000 mg | SUBLINGUAL_TABLET | SUBLINGUAL | Status: DC | PRN

## 2019-11-27 MED ORDER — HEPARIN (PORCINE) IN NACL 1000-0.9 UT/500ML-% IV SOLN
INTRAVENOUS | Status: AC
  Filled 2019-11-27: qty 500

## 2019-11-27 MED ORDER — HEPARIN (PORCINE) IN NACL 1000-0.9 UT/500ML-% IV SOLN
INTRAVENOUS | Status: AC
  Filled 2019-11-27: qty 1000

## 2019-11-27 MED ORDER — LIDOCAINE HCL (PF) 1 % IJ SOLN
INTRAMUSCULAR | Status: DC | PRN
  Administered 2019-11-27: 2 mL

## 2019-11-27 MED ORDER — ADULT MULTIVITAMIN W/MINERALS CH
1.0000 | ORAL_TABLET | Freq: Every day | ORAL | Status: DC
  Administered 2019-11-28 – 2019-11-29 (×2): 1 via ORAL
  Filled 2019-11-27 (×2): qty 1

## 2019-11-27 MED ORDER — SODIUM CHLORIDE 0.9% FLUSH: 3.0000 mL | INTRAVENOUS | Status: DC | PRN

## 2019-11-27 MED ORDER — ISOSORBIDE MONONITRATE ER 30 MG PO TB24
30.0000 mg | ORAL_TABLET | Freq: Every day | ORAL | Status: DC
  Administered 2019-11-28: 30 mg via ORAL
  Filled 2019-11-27: qty 1

## 2019-11-27 MED ORDER — HYDRALAZINE HCL 20 MG/ML IJ SOLN: 10.0000 mg | INTRAMUSCULAR | Status: AC | PRN

## 2019-11-27 MED ORDER — IOHEXOL 350 MG/ML SOLN
INTRAVENOUS | Status: DC | PRN
  Administered 2019-11-27: 55 mL

## 2019-11-27 MED ORDER — LOSARTAN POTASSIUM 50 MG PO TABS
50.0000 mg | ORAL_TABLET | Freq: Every day | ORAL | Status: DC
  Filled 2019-11-27: qty 1

## 2019-11-27 MED ORDER — SODIUM CHLORIDE 0.9 % IV SOLN: INTRAVENOUS | Status: AC

## 2019-11-27 MED ORDER — CLOPIDOGREL BISULFATE 75 MG PO TABS
75.0000 mg | ORAL_TABLET | Freq: Every day | ORAL | Status: DC
  Administered 2019-11-28 – 2019-11-29 (×2): 75 mg via ORAL
  Filled 2019-11-27 (×2): qty 1

## 2019-11-27 MED ORDER — ASPIRIN EC 81 MG PO TBEC
81.0000 mg | DELAYED_RELEASE_TABLET | Freq: Every day | ORAL | Status: DC
  Administered 2019-11-28 – 2019-11-29 (×2): 81 mg via ORAL
  Filled 2019-11-27 (×2): qty 1

## 2019-11-27 MED ORDER — SODIUM CHLORIDE 0.9 % WEIGHT BASED INFUSION
3.0000 mL/kg/h | INTRAVENOUS | Status: DC
  Administered 2019-11-27: 09:00:00 3 mL/kg/h via INTRAVENOUS

## 2019-11-27 MED ORDER — ACETAMINOPHEN 325 MG PO TABS
650.0000 mg | ORAL_TABLET | ORAL | Status: DC | PRN
  Administered 2019-11-29: 650 mg via ORAL
  Filled 2019-11-27: qty 2

## 2019-11-27 MED ORDER — VERAPAMIL HCL 2.5 MG/ML IV SOLN
INTRAVENOUS | Status: DC | PRN
  Administered 2019-11-27: 10 mL via INTRA_ARTERIAL

## 2019-11-27 MED ORDER — MIDAZOLAM HCL 2 MG/2ML IJ SOLN
INTRAMUSCULAR | Status: AC
  Filled 2019-11-27: qty 2

## 2019-11-27 MED ORDER — ONDANSETRON HCL 4 MG/2ML IJ SOLN: 4.0000 mg | Freq: Four times a day (QID) | INTRAMUSCULAR | Status: DC | PRN

## 2019-11-27 MED ORDER — SODIUM CHLORIDE 0.9% FLUSH: 3.0000 mL | Freq: Two times a day (BID) | INTRAVENOUS | Status: DC

## 2019-11-27 MED ORDER — ASPIRIN 81 MG PO CHEW: 81.0000 mg | CHEWABLE_TABLET | ORAL | Status: DC

## 2019-11-27 SURGICAL SUPPLY — 9 items

## 2019-11-27 NOTE — Progress Notes (Signed)
[place] BAND REMOVAL  LOCATION:    right radial  DEFLATED PER PROTOCOL:    Yes.    TIME BAND OFF / DRESSING APPLIED:    yes   SITE UPON ARRIVAL:    Level 0  SITE AFTER BAND REMOVAL:    Level 0  CIRCULATION SENSATION AND MOVEMENT:    Within Normal Limits   Yes.    COMMENTS:   Capillary refill < 3 sec.

## 2019-11-27 NOTE — Interval H&P Note (Signed)
History and Physical Interval Note:  11/27/2019 1:35 PM  Collin [name]  has presented today for cardiac cath with the diagnosis of cad - unstable angina.  The various methods of treatment have been discussed with the patient and family. After consideration of risks, benefits and other options for treatment, the patient has consented to  Procedure(s): LEFT HEART CATH AND CORONARY ANGIOGRAPHY (N/A) as a surgical intervention.  The patient's history has been reviewed, patient examined, no change in status, stable for surgery.  I have reviewed the patient's chart and labs.  Questions were answered to the patient's satisfaction.    Cath Lab Visit (complete for each Cath Lab visit)  Clinical Evaluation Leading to the Procedure:   ACS: No.  Non-ACS:    Anginal Classification: CCS III  Anti-ischemic medical therapy: Maximal Therapy (2 or more classes of medications)  Non-Invasive Test Results: No non-invasive testing performed  Prior CABG: No previous CABG         Collin [name]

## 2019-11-28 ENCOUNTER — Inpatient Hospital Stay (HOSPITAL_COMMUNITY)
Admission: AD | Disposition: A | Discharge status: Home / Self Care | Payer: Self-pay | Source: Home / Self Care | Attending: Cardiovascular Disease

## 2019-11-28 ENCOUNTER — Encounter (HOSPITAL_COMMUNITY): Payer: Self-pay | Admitting: Cardiovascular Disease

## 2019-11-28 DIAGNOSIS — I10 Essential (primary) hypertension: Secondary | ICD-10-CM

## 2019-11-28 DIAGNOSIS — E782 Mixed hyperlipidemia: Secondary | ICD-10-CM

## 2019-11-28 DIAGNOSIS — N1832 Chronic kidney disease, stage 3b: Secondary | ICD-10-CM

## 2019-11-28 DIAGNOSIS — N179 Acute kidney failure, unspecified: Secondary | ICD-10-CM

## 2019-11-28 DIAGNOSIS — I200 Unstable angina: Secondary | ICD-10-CM

## 2019-11-28 HISTORY — PX: CORONARY STENT INTERVENTION: CATH118234

## 2019-11-28 LAB — CBC
HCT: 36.8 % — ABNORMAL LOW (ref 39.0–52.0)
Hemoglobin: 12.7 g/dL — ABNORMAL LOW (ref 13.0–17.0)
MCH: 32.2 pg (ref 26.0–34.0)
MCHC: 34.5 g/dL (ref 30.0–36.0)
MCV: 93.4 fL (ref 80.0–100.0)
Platelets: 117 10*3/uL — ABNORMAL LOW (ref 150–400)
RBC: 3.94 MIL/uL — ABNORMAL LOW (ref 4.22–5.81)
RDW: 12.7 % (ref 11.5–15.5)
WBC: 3.9 10*3/uL — ABNORMAL LOW (ref 4.0–10.5)
nRBC: 0 % (ref 0.0–0.2)

## 2019-11-28 LAB — BASIC METABOLIC PANEL
Anion gap: 10 (ref 5–15)
BUN: 24 mg/dL — ABNORMAL HIGH (ref 8–23)
CO2: 22 mmol/L (ref 22–32)
Calcium: 8.5 mg/dL — ABNORMAL LOW (ref 8.9–10.3)
Chloride: 108 mmol/L (ref 98–111)
Creatinine, Ser: 1.73 mg/dL — ABNORMAL HIGH (ref 0.61–1.24)
GFR calc Af Amer: 39 mL/min — ABNORMAL LOW (ref >60)
GFR calc non Af Amer: 34 mL/min — ABNORMAL LOW (ref >60)
Glucose, Bld: 101 mg/dL — ABNORMAL HIGH (ref 70–99)
Potassium: 4.3 mmol/L (ref 3.5–5.1)
Sodium: 140 mmol/L (ref 135–145)

## 2019-11-28 LAB — POCT ACTIVATED CLOTTING TIME
Activated Clotting Time: 241 seconds
Activated Clotting Time: 378 seconds

## 2019-11-28 SURGERY — CORONARY STENT INTERVENTION
Anesthesia: LOCAL

## 2019-11-28 MED ORDER — SODIUM CHLORIDE 0.9 % IV SOLN: 250.0000 mL | INTRAVENOUS | Status: DC | PRN

## 2019-11-28 MED ORDER — SODIUM CHLORIDE 0.9 % IV SOLN: INTRAVENOUS | Status: AC

## 2019-11-28 MED ORDER — MIDAZOLAM HCL 2 MG/2ML IJ SOLN
INTRAMUSCULAR | Status: DC | PRN
  Administered 2019-11-28: 1 mg via INTRAVENOUS

## 2019-11-28 MED ORDER — FENTANYL CITRATE (PF) 100 MCG/2ML IJ SOLN
INTRAMUSCULAR | Status: AC
  Filled 2019-11-28: qty 2

## 2019-11-28 MED ORDER — LIDOCAINE HCL (PF) 1 % IJ SOLN
INTRAMUSCULAR | Status: DC | PRN
  Administered 2019-11-28: 2 mL

## 2019-11-28 MED ORDER — NITROGLYCERIN 1 MG/10 ML FOR IR/CATH LAB
INTRA_ARTERIAL | Status: AC
  Filled 2019-11-28: qty 10

## 2019-11-28 MED ORDER — SODIUM CHLORIDE 0.9% FLUSH
3.0000 mL | Freq: Two times a day (BID) | INTRAVENOUS | Status: DC
  Administered 2019-11-29: 3 mL via INTRAVENOUS

## 2019-11-28 MED ORDER — SODIUM CHLORIDE 0.9% FLUSH: 3.0000 mL | INTRAVENOUS | Status: DC | PRN

## 2019-11-28 MED ORDER — LABETALOL HCL 5 MG/ML IV SOLN: 10.0000 mg | INTRAVENOUS | Status: AC | PRN

## 2019-11-28 MED ORDER — HEPARIN (PORCINE) IN NACL 1000-0.9 UT/500ML-% IV SOLN
INTRAVENOUS | Status: DC | PRN
  Administered 2019-11-28 (×2): 500 mL

## 2019-11-28 MED ORDER — VERAPAMIL HCL 2.5 MG/ML IV SOLN
INTRAVENOUS | Status: DC | PRN
  Administered 2019-11-28: 10 mL via INTRA_ARTERIAL

## 2019-11-28 MED ORDER — HEPARIN SODIUM (PORCINE) 1000 UNIT/ML IJ SOLN
INTRAMUSCULAR | Status: AC
  Filled 2019-11-28: qty 1

## 2019-11-28 MED ORDER — LIDOCAINE HCL (PF) 1 % IJ SOLN
INTRAMUSCULAR | Status: AC
  Filled 2019-11-28: qty 30

## 2019-11-28 MED ORDER — SODIUM CHLORIDE 0.9% FLUSH: 3.0000 mL | Freq: Two times a day (BID) | INTRAVENOUS | Status: DC

## 2019-11-28 MED ORDER — FENTANYL CITRATE (PF) 100 MCG/2ML IJ SOLN
INTRAMUSCULAR | Status: DC | PRN
  Administered 2019-11-28: 25 ug via INTRAVENOUS

## 2019-11-28 MED ORDER — IOHEXOL 350 MG/ML SOLN
INTRAVENOUS | Status: DC | PRN
  Administered 2019-11-28: 145 mL via INTRACARDIAC

## 2019-11-28 MED ORDER — HEPARIN SODIUM (PORCINE) 1000 UNIT/ML IJ SOLN
INTRAMUSCULAR | Status: DC | PRN
  Administered 2019-11-28: 3000 [IU] via INTRAVENOUS
  Administered 2019-11-28: 10000 [IU] via INTRAVENOUS

## 2019-11-28 MED ORDER — SODIUM CHLORIDE 0.9 % WEIGHT BASED INFUSION: 1.0000 mL/kg/h | INTRAVENOUS | Status: DC

## 2019-11-28 MED ORDER — VERAPAMIL HCL 2.5 MG/ML IV SOLN
INTRAVENOUS | Status: AC
  Filled 2019-11-28: qty 2

## 2019-11-28 MED ORDER — HYDRALAZINE HCL 20 MG/ML IJ SOLN: 10.0000 mg | INTRAMUSCULAR | Status: AC | PRN

## 2019-11-28 MED ORDER — SODIUM CHLORIDE 0.9 % WEIGHT BASED INFUSION
3.0000 mL/kg/h | INTRAVENOUS | Status: DC
  Administered 2019-11-28: 09:00:00 3 mL/kg/h via INTRAVENOUS

## 2019-11-28 MED ORDER — MIDAZOLAM HCL 2 MG/2ML IJ SOLN
INTRAMUSCULAR | Status: AC
  Filled 2019-11-28: qty 2

## 2019-11-28 MED ORDER — HEPARIN (PORCINE) IN NACL 1000-0.9 UT/500ML-% IV SOLN
INTRAVENOUS | Status: AC
  Filled 2019-11-28: qty 1000

## 2019-11-28 SURGICAL SUPPLY — 21 items
BALLN SAPPHIRE 2.0X12 (BALLOONS) ×2
BALLN SAPPHIRE ~~LOC~~ 3.0X18 (BALLOONS) ×1 IMPLANT
BALLN ~~LOC~~ EMERGE MR 2.5X12 (BALLOONS) ×2
BALLOON SAPPHIRE 2.0X12 (BALLOONS) IMPLANT
BALLOON ~~LOC~~ EMERGE MR 2.5X12 (BALLOONS) IMPLANT
CATH LAUNCHER 6FR AL.75 (CATHETERS) ×1 IMPLANT
CATH VISTA GUIDE 6FR XB3 (CATHETERS) ×1 IMPLANT
DEVICE RAD COMP TR BAND LRG (VASCULAR PRODUCTS) ×1 IMPLANT
ELECT DEFIB PAD ADLT CADENCE (PAD) ×1 IMPLANT
GLIDESHEATH SLEND SS 6F .021 (SHEATH) ×1 IMPLANT
GUIDEWIRE INQWIRE 1.5J.035X260 (WIRE) IMPLANT
INQWIRE 1.5J .035X260CM (WIRE) ×2
KIT ENCORE 26 ADVANTAGE (KITS) ×1 IMPLANT
KIT HEART LEFT (KITS) ×2 IMPLANT
PACK CARDIAC CATHETERIZATION (CUSTOM PROCEDURE TRAY) ×2 IMPLANT
STENT SYNERGY DES 2.25X28 (Permanent Stent) ×1 IMPLANT
STENT SYNERGY DES 2.5X16 (Permanent Stent) ×1 IMPLANT
STENT SYNERGY DES 2.75X28 (Permanent Stent) ×1 IMPLANT
TRANSDUCER W/STOPCOCK (MISCELLANEOUS) ×2 IMPLANT
TUBING CIL FLEX 10 FLL-RA (TUBING) ×2 IMPLANT
WIRE COUGAR XT STRL 190CM (WIRE) ×1 IMPLANT

## 2019-11-28 NOTE — Progress Notes (Signed)
Progress Note  Patient Name: Collin [name] Date of Encounter: 11/28/2019  Primary Cardiologist: Mertie Moores, MD   Subjective   Feels well. No chest pain. Urinating well.   Inpatient Medications    Scheduled Meds: . alfuzosin  10 mg Oral Daily  . aspirin EC  81 mg Oral Daily  . carvedilol  3.125 mg Oral BID  . clopidogrel  75 mg Oral Daily  . finasteride  5 mg Oral Daily  . isosorbide mononitrate  30 mg Oral Daily  . losartan  50 mg Oral Daily  . multivitamin with minerals  1 tablet Oral Daily  . sodium chloride flush  3 mL Intravenous Q12H   Continuous Infusions: . sodium chloride    . sodium chloride     Followed by  . sodium chloride     PRN Meds: sodium chloride, acetaminophen, nitroGLYCERIN, ondansetron (ZOFRAN) IV, sodium chloride flush   Vital Signs    Vitals:   11/28/19 0102 11/28/19 0451 11/28/19 0455 11/28/19 0813  BP: (!) 132/56  (!) 148/57 (!) 142/65  Pulse: (!) 56  (!) 52 (!) 53  Resp: 17  16 20   Temp: (!) 97.5 F (36.4 C)  97.6 F (36.4 C) 98.1 F (36.7 C)  TempSrc: Oral  Oral Oral  SpO2: 97%  96% 96%  Weight:  74 kg    Height:        Intake/Output Summary (Last 24 hours) at 11/28/2019 0850 Last data filed at 11/28/2019 0700 Gross per 24 hour  Intake 270 ml  Output 1000 ml  Net -730 ml   Last 3 Weights 11/28/2019 11/27/2019 11/27/2019  Weight (lbs) 163 lb 1.6 oz 163 lb 165 lb  Weight (kg) 73.982 kg 73.936 kg 74.844 kg      Telemetry    NSR - Personally Reviewed  ECG    None today - Personally Reviewed  Physical Exam   GEN: Elderly WM No acute distress.   Neck: No JVD Cardiac: RRR, no murmurs, rubs, or gallops.  Respiratory: Clear to auscultation bilaterally. GI: Soft, nontender, non-distended  MS: No edema; No deformity. Right radial site without hematoma Neuro:  Nonfocal  Psych: Normal affect   Labs    High Sensitivity Troponin:  No results for input(s): TROPONINIHS in the last 720 hours.    Chemistry Recent Labs   Lab 11/28/19 0459  NA 140  K 4.3  CL 108  CO2 22  GLUCOSE 101*  BUN 24*  CREATININE 1.73*  CALCIUM 8.5*  GFRNONAA 34*  GFRAA 39*  ANIONGAP 10     Hematology Recent Labs  Lab 11/28/19 0459  WBC 3.9*  RBC 3.94*  HGB 12.7*  HCT 36.8*  MCV 93.4  MCH 32.2  MCHC 34.5  RDW 12.7  PLT 117*    BNPNo results for input(s): BNP, PROBNP in the last 168 hours.   DDimer No results for input(s): DDIMER in the last 168 hours.   Radiology    No results found.  Cardiac Studies   Echo 01/08/19: Study Conclusions  - Left ventricle: The cavity size was normal. Wall thickness was   normal. Systolic function was mildly reduced. The estimated   ejection fraction was in the range of 45% to 50%. There is   hypokinesis of the inferolateral myocardium. Doppler parameters   are consistent with abnormal left ventricular relaxation (grade 1   diastolic dysfunction). - Mitral valve: There was mild regurgitation.  Impressions:  - Hypokinesis of the inferolateral wall with overall mildly  reduced   LV function (EF 50); mild diastolic dysfunction; mild MR.  Cardiac cath 11/27/19:  LEFT HEART CATH AND CORONARY ANGIOGRAPHY  Conclusion    Dist RCA lesion is 50% stenosed.  Mid RCA lesion is 80% stenosed.  Prox RCA lesion is 30% stenosed.  Prox Cx to Mid Cx lesion is 90% stenosed.  2nd Mrg lesion is 30% stenosed.  Dist LM to Prox LAD lesion is 50% stenosed.  2nd Diag lesion is 70% stenosed.  Prox LAD to Mid LAD lesion is 70% stenosed.  RPDA lesion is 95% stenosed.  Dist LAD lesion is 20% stenosed.   1. Severe triple vessel CAD with mild left main stenosis 2. The LAD has a long segment of moderately severe, heavily calcified stenosis. The Diagonal branch has a long proximal moderately severe stenosis.  3. The Circumflex is a moderate caliber vessel. The mid Circumflex has a severe stenosis just before the takeoff of a moderate caliber obtuse marginal branch.  4. The RCA is  a large dominant artery with a patent distal stent. The stented segment has moderate restenosis. Just before the stent in the mid vessel, there is a severe stenosis. The PDA is a moderate caliber vessel with a severe proximal stenosis and then diffuse moderate disease in the distal vessel.   Recommendations: He has one kidney and has a baseline creatinine of 1.6-1.8. Will admit to telemetry and hydrate today. Will review films with interventional colleagues. I think we could consider PCI/stenting of the mid RCA, PDA and mid Circumflex lesions. The LAD is heavily calcified and only appears to be moderate, grossly unchanged since cath in 2005.  Will continue ASA and Plavix. If renal function is stable tomorrow, will consider multi-vessel PCI.       Patient Profile     85 y.o. male with hx of CAD s/p stenting in 2005 by Dr. Olevia Perches, HTN, HLD, CKD III and COPD seen for SOB and chest pain.   Assessment & Plan    1. CAD with progressive angina. Findings on cath yesterday as noted. Disease in the LCx and RCA/PDA appear suitable for PCI. He is on antianginal therapy with Imdur and Coreg. On DAPT. Plan to proceed with PCI today with Dr Angelena Form 2. CKD stage 3b. Baseline creatinine 1.6-1.8. today is 1.73. plan to give hydration prior to PCI today. He has a single kidney s/p left nephrectomy in the past for renal cell CA 3. HTN controlled. 4. HLD last LDL of 107. History of intolerance to statins. May want to consider Zetia.   For questions or updates, please contact [place] Please consult [email or web address] for contact info under        Signed, Leta Bucklin Martinique, MD  11/28/2019, 8:50 AM

## 2019-11-28 NOTE — H&P (View-Only) (Signed)
Progress Note  Patient Name: Collin [name] Date of Encounter: 11/28/2019  Primary Cardiologist: Mertie Moores, MD   Subjective   Feels well. No chest pain. Urinating well.   Inpatient Medications    Scheduled Meds: . alfuzosin  10 mg Oral Daily  . aspirin EC  81 mg Oral Daily  . carvedilol  3.125 mg Oral BID  . clopidogrel  75 mg Oral Daily  . finasteride  5 mg Oral Daily  . isosorbide mononitrate  30 mg Oral Daily  . losartan  50 mg Oral Daily  . multivitamin with minerals  1 tablet Oral Daily  . sodium chloride flush  3 mL Intravenous Q12H   Continuous Infusions: . sodium chloride    . sodium chloride     Followed by  . sodium chloride     PRN Meds: sodium chloride, acetaminophen, nitroGLYCERIN, ondansetron (ZOFRAN) IV, sodium chloride flush   Vital Signs    Vitals:   11/28/19 0102 11/28/19 0451 11/28/19 0455 11/28/19 0813  BP: (!) 132/56  (!) 148/57 (!) 142/65  Pulse: (!) 56  (!) 52 (!) 53  Resp: 17  16 20   Temp: (!) 97.5 F (36.4 C)  97.6 F (36.4 C) 98.1 F (36.7 C)  TempSrc: Oral  Oral Oral  SpO2: 97%  96% 96%  Weight:  74 kg    Height:        Intake/Output Summary (Last 24 hours) at 11/28/2019 0850 Last data filed at 11/28/2019 0700 Gross per 24 hour  Intake 270 ml  Output 1000 ml  Net -730 ml   Last 3 Weights 11/28/2019 11/27/2019 11/27/2019  Weight (lbs) 163 lb 1.6 oz 163 lb 165 lb  Weight (kg) 73.982 kg 73.936 kg 74.844 kg      Telemetry    NSR - Personally Reviewed  ECG    None today - Personally Reviewed  Physical Exam   GEN: Elderly WM No acute distress.   Neck: No JVD Cardiac: RRR, no murmurs, rubs, or gallops.  Respiratory: Clear to auscultation bilaterally. GI: Soft, nontender, non-distended  MS: No edema; No deformity. Right radial site without hematoma Neuro:  Nonfocal  Psych: Normal affect   Labs    High Sensitivity Troponin:  No results for input(s): TROPONINIHS in the last 720 hours.    Chemistry Recent Labs   Lab 11/28/19 0459  NA 140  K 4.3  CL 108  CO2 22  GLUCOSE 101*  BUN 24*  CREATININE 1.73*  CALCIUM 8.5*  GFRNONAA 34*  GFRAA 39*  ANIONGAP 10     Hematology Recent Labs  Lab 11/28/19 0459  WBC 3.9*  RBC 3.94*  HGB 12.7*  HCT 36.8*  MCV 93.4  MCH 32.2  MCHC 34.5  RDW 12.7  PLT 117*    BNPNo results for input(s): BNP, PROBNP in the last 168 hours.   DDimer No results for input(s): DDIMER in the last 168 hours.   Radiology    No results found.  Cardiac Studies   Echo 01/08/19: Study Conclusions  - Left ventricle: The cavity size was normal. Wall thickness was   normal. Systolic function was mildly reduced. The estimated   ejection fraction was in the range of 45% to 50%. There is   hypokinesis of the inferolateral myocardium. Doppler parameters   are consistent with abnormal left ventricular relaxation (grade 1   diastolic dysfunction). - Mitral valve: There was mild regurgitation.  Impressions:  - Hypokinesis of the inferolateral wall with overall mildly  reduced   LV function (EF 50); mild diastolic dysfunction; mild MR.  Cardiac cath 11/27/19:  LEFT HEART CATH AND CORONARY ANGIOGRAPHY  Conclusion    Dist RCA lesion is 50% stenosed.  Mid RCA lesion is 80% stenosed.  Prox RCA lesion is 30% stenosed.  Prox Cx to Mid Cx lesion is 90% stenosed.  2nd Mrg lesion is 30% stenosed.  Dist LM to Prox LAD lesion is 50% stenosed.  2nd Diag lesion is 70% stenosed.  Prox LAD to Mid LAD lesion is 70% stenosed.  RPDA lesion is 95% stenosed.  Dist LAD lesion is 20% stenosed.   1. Severe triple vessel CAD with mild left main stenosis 2. The LAD has a long segment of moderately severe, heavily calcified stenosis. The Diagonal branch has a long proximal moderately severe stenosis.  3. The Circumflex is a moderate caliber vessel. The mid Circumflex has a severe stenosis just before the takeoff of a moderate caliber obtuse marginal branch.  4. The RCA is  a large dominant artery with a patent distal stent. The stented segment has moderate restenosis. Just before the stent in the mid vessel, there is a severe stenosis. The PDA is a moderate caliber vessel with a severe proximal stenosis and then diffuse moderate disease in the distal vessel.   Recommendations: He has one kidney and has a baseline creatinine of 1.6-1.8. Will admit to telemetry and hydrate today. Will review films with interventional colleagues. I think we could consider PCI/stenting of the mid RCA, PDA and mid Circumflex lesions. The LAD is heavily calcified and only appears to be moderate, grossly unchanged since cath in 2005.  Will continue ASA and Plavix. If renal function is stable tomorrow, will consider multi-vessel PCI.       Patient Profile     85 y.o. male with hx of CAD s/p stenting in 2005 by Dr. Olevia Perches, HTN, HLD, CKD III and COPD seen for SOB and chest pain.   Assessment & Plan    1. CAD with progressive angina. Findings on cath yesterday as noted. Disease in the LCx and RCA/PDA appear suitable for PCI. He is on antianginal therapy with Imdur and Coreg. On DAPT. Plan to proceed with PCI today with Dr Angelena Form 2. CKD stage 3b. Baseline creatinine 1.6-1.8. today is 1.73. plan to give hydration prior to PCI today. He has a single kidney s/p left nephrectomy in the past for renal cell CA 3. HTN controlled. 4. HLD last LDL of 107. History of intolerance to statins. May want to consider Zetia.   For questions or updates, please contact [place] Please consult [email or web address] for contact info under        Signed, Peter Martinique, MD  11/28/2019, 8:50 AM

## 2019-11-28 NOTE — Progress Notes (Signed)
  IV team placed iv.  Pt was transported to cath. Consent already signed, procedure video played in pts television this morning. Pt has been NPO.

## 2019-11-28 NOTE — Progress Notes (Signed)
Nurse paged with patient question about taking losartan prior to cath, since he had been instructed to hold prior to initial procedure. Given CKD, will hold in prep for cath. Can be resumed post-procedure when creatinine deemed stable. Dayna Dunn PA-C

## 2019-11-28 NOTE — Plan of Care (Signed)

## 2019-11-28 NOTE — Interval H&P Note (Signed)
History and Physical Interval Note:  11/28/2019 11:25 AM  Collin [name]  has presented today for cardiac cath/PCI with the diagnosis of CAD with unstable angina.  The various methods of treatment have been discussed with the patient and family. After consideration of risks, benefits and other options for treatment, the patient has consented to  Procedure(s): CORONARY STENT INTERVENTION (N/A) as a surgical intervention.  The patient's history has been reviewed, patient examined, no change in status, stable for surgery.  I have reviewed the patient's chart and labs.  Questions were answered to the patient's satisfaction.    Cath Lab Visit (complete for each Cath Lab visit)  Clinical Evaluation Leading to the Procedure:   ACS: No.  Non-ACS:    Anginal Classification: CCS III  Anti-ischemic medical therapy: Maximal Therapy (2 or more classes of medications)  Non-Invasive Test Results: No non-invasive testing performed  Prior CABG: No previous CABG        [name]

## 2019-11-29 ENCOUNTER — Other Ambulatory Visit: Payer: Self-pay | Admitting: Physician Assistant

## 2019-11-29 ENCOUNTER — Encounter (HOSPITAL_COMMUNITY): Payer: Self-pay | Admitting: Cardiovascular Disease

## 2019-11-29 DIAGNOSIS — N1832 Chronic kidney disease, stage 3b: Secondary | ICD-10-CM

## 2019-11-29 DIAGNOSIS — D649 Anemia, unspecified: Secondary | ICD-10-CM

## 2019-11-29 DIAGNOSIS — D696 Thrombocytopenia, unspecified: Secondary | ICD-10-CM

## 2019-11-29 LAB — CBC
HCT: 32.8 % — ABNORMAL LOW (ref 39.0–52.0)
Hemoglobin: 11.4 g/dL — ABNORMAL LOW (ref 13.0–17.0)
MCH: 32 pg (ref 26.0–34.0)
MCHC: 34.8 g/dL (ref 30.0–36.0)
MCV: 92.1 fL (ref 80.0–100.0)
Platelets: 104 10*3/uL — ABNORMAL LOW (ref 150–400)
RBC: 3.56 MIL/uL — ABNORMAL LOW (ref 4.22–5.81)
RDW: 12.9 % (ref 11.5–15.5)
WBC: 5.8 10*3/uL (ref 4.0–10.5)
nRBC: 0 % (ref 0.0–0.2)

## 2019-11-29 LAB — BASIC METABOLIC PANEL
Anion gap: 9 (ref 5–15)
BUN: 26 mg/dL — ABNORMAL HIGH (ref 8–23)
CO2: 21 mmol/L — ABNORMAL LOW (ref 22–32)
Calcium: 8.5 mg/dL — ABNORMAL LOW (ref 8.9–10.3)
Chloride: 108 mmol/L (ref 98–111)
Creatinine, Ser: 1.75 mg/dL — ABNORMAL HIGH (ref 0.61–1.24)
GFR calc Af Amer: 38 mL/min — ABNORMAL LOW (ref >60)
GFR calc non Af Amer: 33 mL/min — ABNORMAL LOW (ref >60)
Glucose, Bld: 99 mg/dL (ref 70–99)
Potassium: 4 mmol/L (ref 3.5–5.1)
Sodium: 138 mmol/L (ref 135–145)

## 2019-11-29 LAB — GLUCOSE, CAPILLARY: Glucose-Capillary: 113 mg/dL — ABNORMAL HIGH (ref 70–99)

## 2019-11-29 MED ORDER — ROSUVASTATIN CALCIUM 5 MG PO TABS: 5.0000 mg | ORAL_TABLET | Freq: Every day | ORAL | 3 refills | Status: DC

## 2019-11-29 MED ORDER — ANGIOPLASTY BOOK
Freq: Once | Status: AC
  Administered 2019-11-29: 07:00:00
  Filled 2019-11-29: qty 1

## 2019-11-29 MED ORDER — FISH OIL 1000 MG PO CAPS: 1.0000 | ORAL_CAPSULE | Freq: Two times a day (BID) | ORAL | 0 refills | Status: DC

## 2019-11-29 MED FILL — Nitroglycerin IV Soln 100 MCG/ML in D5W: INTRA_ARTERIAL | Qty: 10 | Status: AC

## 2019-11-29 MED FILL — ROSUVASTATIN CALCIUM 5 MG T: 5 | 30 days supply | Qty: 30 | Fill #0

## 2019-11-29 NOTE — Progress Notes (Signed)
CARDIAC REHAB PHASE I   PRE:  Rate/Rhythm: 66 SR  BP:  Supine:   Sitting: 92/63 at 0630  Standing:    SaO2:   MODE:  Ambulation: 470 ft   POST:  Rate/Rhythm: 96 SR  BP:  Supine:   Sitting: 125/67  Standing:    SaO2: 97%RA 0840-0925 Pt walked 470 ft on RA independently. Up in room when I entered. No CP. Tolerated well. Education completed with pt who voiced understanding. Has been on plavix already. Reviewed NTG use, watching carbs and heart healthy diet given, walking for ex and CRP 2. Pt attended CRP 2 [place] about 15 years ago. Referred to [facility] program. Pt very active for his age and walks on treadmill 30 minutes several days a week.   Graylon Good, RN BSN  11/29/2019 9:20 AM

## 2019-11-29 NOTE — Progress Notes (Signed)
cbc

## 2019-11-29 NOTE — Discharge Summary (Signed)
Appointment? Imdur, losartan Statin? F/u bmet?   Discharge Summary    Patient ID: Collin [name] MRN: [id number]; DOB: 01/23/1925  Admit date: 11/27/2019 Discharge date: 11/29/2019  Primary Care Provider: Binnie Rail, MD  Primary Cardiologist: Mertie Moores, MD  Primary Electrophysiologist:  None   Discharge Diagnoses    Principal Problem:   Unstable angina [facility]) Active Problems:   CAD (coronary artery disease)   Dyslipidemia   ANEMIA, NORMOCYTIC   Essential hypertension   CKD (chronic kidney disease), stage III   Thrombocytopenia [facility])    Diagnostic Studies/Procedures     Diagnostic Cath 11/27/19 Conclusion    Dist RCA lesion is 50% stenosed.  Mid RCA lesion is 80% stenosed.  Prox RCA lesion is 30% stenosed.  Prox Cx to Mid Cx lesion is 90% stenosed.  2nd Mrg lesion is 30% stenosed.  Dist LM to Prox LAD lesion is 50% stenosed.  2nd Diag lesion is 70% stenosed.  Prox LAD to Mid LAD lesion is 70% stenosed.  RPDA lesion is 95% stenosed.  Dist LAD lesion is 20% stenosed.   1. Severe triple vessel CAD with mild left main stenosis 2. The LAD has a long segment of moderately severe, heavily calcified stenosis. The Diagonal branch has a long proximal moderately severe stenosis.  3. The Circumflex is a moderate caliber vessel. The mid Circumflex has a severe stenosis just before the takeoff of a moderate caliber obtuse marginal branch.  4. The RCA is a large dominant artery with a patent distal stent. The stented segment has moderate restenosis. Just before the stent in the mid vessel, there is a severe stenosis. The PDA is a moderate caliber vessel with a severe proximal stenosis and then diffuse moderate disease in the distal vessel.   Recommendations: He has one kidney and has a baseline creatinine of 1.6-1.8. Will admit to telemetry and hydrate today. Will review films with interventional colleagues. I think we could consider PCI/stenting of the mid RCA, PDA  and mid Circumflex lesions. The LAD is heavily calcified and only appears to be moderate, grossly unchanged since cath in 2005.  Will continue ASA and Plavix. If renal function is stable tomorrow, will consider multi-vessel PCI.    Interventional Cath 11/28/19 Conclusion    2nd Mrg lesion is 30% stenosed.  Prox Cx to Mid Cx lesion is 90% stenosed.  Dist RCA lesion is 50% stenosed.  Mid RCA lesion is 80% stenosed.  Prox RCA lesion is 30% stenosed.  RPDA lesion is 95% stenosed.  A drug-eluting stent was successfully placed using a STENT SYNERGY DES 2.25X28.  Post intervention, there is a 0% residual stenosis.  A drug-eluting stent was successfully placed using a STENT SYNERGY DES 2.75X28.  Post intervention, there is a 0% residual stenosis.  Post intervention, there is a 0% residual stenosis.  A drug-eluting stent was successfully placed using a STENT SYNERGY DES 2.5X16.  Post intervention, there is a 0% residual stenosis.   1. Successful PTCA/DES x 1 PDA 2. Successful PTCA/DES x 1 mid RCA 3. Successful PTCA/DES x 1 mid Circumflex.   Continue DAPT with ASA and Plavix for at least 6 months.       _____________   History of Present Illness     Collin [name] is a 85 y.o. 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 The pain was in the center of his chest worse with exertion such as walking or carrying groceries in. It would last for several minutes or until he stopped to rest. He was started on aspirin and Imdur and scheduled for cardiac cath.   Hospital Course     Initial cath 11/27/19 showed CAD in multiple vessels as above,  and interventional strategies were discussed with colleagues. He was hydrated and returned to the cath lab yesterday for planned PCI and underwent successful PTCA/DES to the PDA, mid RCA and mid Cx. It was recommended to continue ASA + Plavix for at least 6 months. Post-cath labs demonstrated a Cr of 1.75, as well as mild anemia/thrombocytopenia. We have arranged for him to return to the office on Monday 12/7 for repeat BMET/CBC. We have also arranged 2 week post-cath follow-up. His BP was low-normal off losartan so we have not resumed at this time. The patient was instructed to monitor their blood pressure at home and to call if tending to run higher than 130/80. He actually never started the Imdur prescribed recently - given his PCI this admission, we do not feel he needs this at this time. He was already on Plavix prior to admission and declined needing a script.  Regarding lipids, he has history of prior intolerance to atorvastatin so was started on low dose Crestor at 5mg . Fenofibrate was stopped because of contraindication with the patient's creatinine clearance. Dr. Martinique recommended OTC fish oil in its place. If the patient is tolerating statin at time of follow-up appointment, would consider rechecking liver function/lipid panel in 6-8 weeks. The maximum dose of Crestor allowed would be 10mg  daily given his renal function. Dr. Martinique has seen and examined the patient today and feels he is stable for discharge.    Did the patient have an acute coronary syndrome (MI, NSTEMI, STEMI, etc) this admission?:  No                               Did the patient have a percutaneous coronary intervention (stent / angioplasty)?:  Yes.     Cath/PCI Registry Performance & Quality Measures: 1. Aspirin prescribed? - Yes 2. ADP Receptor Inhibitor (Plavix/Clopidogrel, Brilinta/Ticagrelor or Effient/Prasugrel) prescribed (includes medically managed patients)? - Yes 3. High Intensity Statin (Lipitor 40-80mg  or  Crestor 20-40mg ) prescribed? - No - prior intolerance to atorvastatin - given advanced age and CKD, we are starting trial of lower dose Crestor 4. For EF <40%, was ACEI/ARB prescribed? - Not Applicable (EF >/= 03%) 5. For EF <40%, Aldosterone Antagonist (Spironolactone or Eplerenone) prescribed? - Not Applicable (EF >/= 50%) 6. Cardiac Rehab Phase II ordered (Included Medically managed Patients)? - Yes   _____________  Discharge Vitals Blood pressure 122/63, pulse 61, temperature 98.3 F (36.8 C), temperature source Oral, resp. rate 20, height 5\' 7"  (1.702 m), weight 74.7 kg, SpO2 93 %.  Filed Weights   11/28/19 0942 11/28/19 1442 11/29/19 0630  Weight: 74 kg 77 kg 74.7 kg  Vital Signs. BP 122/63    Pulse 61    Temp 98.3 F (36.8 C) (Oral)    Resp 20    Ht 5\' 7"  (1.702 m)    Wt 74.7 kg    SpO2 93%    BMI 25.80 kg/m  General: Well developed,  well nourished WM in no acute distress. Head: Normocephalic, atraumatic, sclera non-icteric, no xanthomas, nares are without discharge. Neck: Negative for carotid bruits. JVP not elevated. Lungs: Clear bilaterally to auscultation without wheezes, rales, or rhonchi. Breathing is unlabored. Heart: RRR S1 S2 without murmurs, rubs, or gallops.  Abdomen: Soft, non-tender, non-distended with normoactive bowel sounds. No rebound/guarding. Extremities: No clubbing or cyanosis. No edema. Distal pedal pulses are 2+ and equal bilaterally. Right radial cath site without hematoma or ecchymosis; good pulse. Neuro: Alert and oriented X 3. Moves all extremities spontaneously. Psych:  Responds to questions appropriately with a normal affect.   Labs & Radiologic Studies    CBC Recent Labs    11/28/19 0459 11/29/19 0403  WBC 3.9* 5.8  HGB 12.7* 11.4*  HCT 36.8* 32.8*  MCV 93.4 92.1  PLT 117* 417*   Basic Metabolic Panel Recent Labs    11/28/19 0459 11/29/19 0403  NA 140 138  K 4.3 4.0  CL 108 108  CO2 22 21*  GLUCOSE 101* 99  BUN 24* 26*    CREATININE 1.73* 1.75*  CALCIUM 8.5* 8.5*  _____________  Dg Chest 2 View  Result Date: 11/01/2019 CLINICAL DATA:  85 year old male with shortness of breath EXAM: CHEST - 2 VIEW COMPARISON:  01/01/2014 FINDINGS: Cardiomediastinal silhouette unchanged in size and contour. No evidence of central vascular congestion. No pneumothorax or pleural effusion. No confluent airspace disease. No displaced fracture.  Degenerative changes of the spine IMPRESSION: Negative for acute cardiopulmonary disease. Electronically Signed   By: Corrie Mckusick D.O.   On: 11/01/2019 14:13   Disposition   Pt is being discharged home today in good condition.  Follow-up Plans & Appointments    Follow-up Information    Liliane Shi, PA-C Follow up.   Specialties: Cardiology, Physician Assistant Why: [facility] - 12/13/19 at 11:45am. Please arrive 15 minutes prior to appointment to check in. Nicki Reaper is one of the PAs that works closely with Dr. Acie Fredrickson. Contact information: [address] [address] [phone]        [place] [facility] Follow up.   Specialty: Cardiology Why: Please come to our office on Monday 12/7 between 7:30am-4:30pm to have your repeat labwork drawn to make sure your kidney count and blood counts are stable. Contact information: [address] [place] [phone]         Discharge Instructions    Amb Referral to Cardiac Rehabilitation   Complete by: As directed    Diagnosis: Coronary Stents   After initial evaluation and assessments completed: Virtual Based Care may be provided alone or in conjunction with Phase 2 Cardiac Rehab based on patient barriers.: Yes   Diet - low sodium heart healthy   Complete by: As directed    Discharge instructions   Complete by: As directed    You do not have to pick up or start the isosorbide/Imdur prescription that was recently recommended. We called the  pharmacy to cancel that prescription.  We have stopped your losartan for now because your blood pressure tended to run on the lower side. Please monitor your blood pressure occasionally at home. Call our office if you tend to get readings of greater than 130 on the top number or 80 on the bottom number. We may restart some of your losartan if your blood pressure begins to creep up, so do not throw this away yet.  We have started you on a  new cholesterol medication called rosuvastatin. We will plan to see how you are doing on it when we see you back in clinic.  Fenofibrate was stopped due to your kidney function number. Dr. Martinique would like you to start over-the-counter fish oil 1000mg  twice a day in its place.   Increase activity slowly   Complete by: As directed    No driving for 2 days. No lifting over 5 lbs for 1 week. No sexual activity for 1 week. Keep procedure site clean & dry. If you notice increased pain, swelling, bleeding or pus, call/return!  You may shower, but no soaking baths/hot tubs/pools for 1 week.      Discharge Medications   Allergies as of 11/29/2019      Reactions   Atorvastatin    Muscle aches    Peanut Oil Rash   sneezing   Trandolapril Cough      Medication List    STOP taking these medications   fenofibrate 48 MG tablet Commonly known as: TRICOR   isosorbide mononitrate 30 MG 24 hr tablet Commonly known as: IMDUR   losartan 50 MG tablet Commonly known as: COZAAR     TAKE these medications   acetaminophen 500 MG tablet Commonly known as: TYLENOL Take 500 mg by mouth every 6 (six) hours as needed for moderate pain or headache.   alfuzosin 10 MG 24 hr tablet Commonly known as: UROXATRAL Take 10 mg by mouth daily.   aspirin EC 81 MG tablet Take 1 tablet (81 mg total) by mouth daily.   carvedilol 3.125 MG tablet Commonly known as: COREG Take 1 tablet (3.125 mg total) by mouth 2 (two) times daily.   clopidogrel 75 MG tablet Commonly known as:  PLAVIX TAKE 1 TABLET DAILY   finasteride 5 MG tablet Commonly known as: PROSCAR Take 5 mg by mouth daily.   Fish Oil 1000 MG Caps Take 1 capsule (1,000 mg total) by mouth 2 (two) times daily.   multivitamin tablet Take 1 tablet by mouth daily.   nitroGLYCERIN 0.4 MG SL tablet Commonly known as: NITROSTAT Place 1 tablet (0.4 mg total) under the tongue every 5 (five) minutes as needed for chest pain.   rosuvastatin 5 MG tablet Commonly known as: Crestor Take 1 tablet (5 mg total) by mouth at bedtime.   sodium chloride 0.65 % Soln nasal spray Commonly known as: OCEAN Place 1 spray into both nostrils as needed for congestion.   triamcinolone ointment 0.1 % Commonly known as: KENALOG Apply 1 application topically daily.          Outstanding Labs/Studies   CBC/BMET Monday 12/7 as above  If the patient is tolerating statin at time of follow-up appointment, would consider rechecking liver function/lipid panel in 6-8 weeks.  Duration of Discharge Encounter   Greater than 30 minutes including physician time.  Signed, Charlie Pitter, PA-C 11/29/2019, 12:10 PM

## 2019-11-29 NOTE — Care Management Important Message (Signed)
Important Message  Patient Details  Name: Collin [name] MRN: [id number] Date of Birth: January 26, 1925   Medicare Important Message Given:  Yes     [name] 11/29/2019, 11:55 AM

## 2019-11-29 NOTE — Plan of Care (Signed)
  Problem: Education: Goal: Knowledge of General Education information will improve Description: Including pain rating scale, medication(s)/side effects and non-pharmacologic comfort measures Outcome: Progressing   Problem: Clinical Measurements: Goal: Ability to maintain clinical measurements within normal limits will improve Outcome: Progressing Goal: Will remain free from infection Outcome: Progressing Goal: Diagnostic test results will improve Outcome: Progressing   Problem: Coping: Goal: Level of anxiety will decrease Outcome: Progressing   Problem: Pain Managment: Goal: General experience of comfort will improve Outcome: Progressing   

## 2019-11-29 NOTE — Progress Notes (Addendum)
   Patient seen/examined, discharge note started. Initial cath 11/27/19 showed CAD in multiple vessels as above, and interventional strategies were discussed with colleagues. He was hydrated and returned to the cath lab yesterday for planned PCI and underwent successful PTCA/DES to the PDA, mid RCA and mid Cx. It was recommended to continue ASA + Plavix for at least 6 months.   Mild anemia/tcp noted on labs but Cr is relatively stable. Pt appears well and did well with cardiac rehab.  Will discuss statin with MD. He had muscle aches with Lipitor but has been tolerating fenofibrate. He would be amenable to statin if requested. Also, losartan remains on hold due to CKD + recent cath. Also noted to have soft BPs at times as well. Was prescribed Imdur as OP but he never even picked this up yet - now that PCI has been pursued, not clear that he still needs this. Tentatively has 2 month post-cath f/u but may benefit from sooner f/u. Will review with MD.  He would like to use [facility] [place] and notes preference for 90 day fill.   Thorvald Orsino PA-C

## 2019-12-02 ENCOUNTER — Other Ambulatory Visit: Payer: Self-pay

## 2019-12-02 ENCOUNTER — Other Ambulatory Visit: Payer: Medicare Other | Admitting: *Deleted

## 2019-12-02 DIAGNOSIS — D696 Thrombocytopenia, unspecified: Secondary | ICD-10-CM

## 2019-12-02 DIAGNOSIS — N1832 Chronic kidney disease, stage 3b: Secondary | ICD-10-CM

## 2019-12-02 DIAGNOSIS — D649 Anemia, unspecified: Secondary | ICD-10-CM

## 2019-12-03 ENCOUNTER — Telehealth (HOSPITAL_COMMUNITY): Payer: Self-pay | Admitting: *Deleted

## 2019-12-03 LAB — BASIC METABOLIC PANEL
BUN/Creatinine Ratio: 20 (ref 10–24)
BUN: 38 mg/dL — ABNORMAL HIGH (ref 10–36)
CO2: 23 mmol/L (ref 20–29)
Calcium: 9.5 mg/dL (ref 8.6–10.2)
Chloride: 102 mmol/L (ref 96–106)
Creatinine, Ser: 1.87 mg/dL — ABNORMAL HIGH (ref 0.76–1.27)
GFR calc Af Amer: 35 mL/min/{1.73_m2} — ABNORMAL LOW (ref >59)
GFR calc non Af Amer: 31 mL/min/{1.73_m2} — ABNORMAL LOW (ref >59)
Glucose: 100 mg/dL — ABNORMAL HIGH (ref 65–99)
Potassium: 4.5 mmol/L (ref 3.5–5.2)
Sodium: 140 mmol/L (ref 134–144)

## 2019-12-03 LAB — CBC
Hematocrit: 37.1 % — ABNORMAL LOW (ref 37.5–51.0)
Hemoglobin: 12.7 g/dL — ABNORMAL LOW (ref 13.0–17.7)
MCH: 31.2 pg (ref 26.6–33.0)
MCHC: 34.2 g/dL (ref 31.5–35.7)
MCV: 91 fL (ref 79–97)
Platelets: 133 10*3/uL — ABNORMAL LOW (ref 150–450)
RBC: 4.07 x10E6/uL — ABNORMAL LOW (ref 4.14–5.80)
RDW: 12.9 % (ref 11.6–15.4)
WBC: 4.7 10*3/uL (ref 3.4–10.8)

## 2019-12-03 NOTE — Telephone Encounter (Signed)
-----   Message from Thayer Headings, MD sent at 12/03/2019  1:09 PM EST ----- Regarding: RE: Ok to participate in onsite Cardiac rehab with high covid risk score Pt would benefit from on-site cardiac rehab if he wishes to participate  Collin Rogers, MD  ----- Message ----- From: Rowe Pavy, RN Sent: 12/02/2019   2:35 PM EST To: Thayer Headings, MD Subject: Ok to participate in onsite Cardiac rehab wi#   Dr. Cathie Olden  We are seeing patients on-site for cardiac rehab . A great deal of planning with advisement from our Medical Director - Dr. Radford Pax, CV Service line leadership, Infection Disease Control, Facilities, security, recommendations from American Association of Cardiac and Pulmonary Rehab (AACVPR) with the goal for optimal patient safety. Patients will have strict guidelines and criteria they must adhere to and follow. Patients will wear a mask during exercise and practice social distancing. Patients will have to complete screening prior to entry into gym area.   Your patient expressed interest in participating in facility cardiac rehab. Patient has a High COVID-19 risk score of 8. Do you feel this patient is appropriate exercise in person cardiac rehab? Any additional restrictions you feel are appropriate specifically for this patient?   Follow up scheduled for 12/18. Thank you and we appreciate your input  Maurice Small RN, BSN Cardiac and Pulmonary Rehab Nurse Navigator   Cardiac Rehab Staff

## 2019-12-06 ENCOUNTER — Telehealth: Payer: Self-pay

## 2019-12-06 MED ORDER — CARVEDILOL 3.125 MG PO TABS: 3.1250 mg | ORAL_TABLET | Freq: Two times a day (BID) | ORAL | 3 refills | Status: DC

## 2019-12-06 NOTE — Telephone Encounter (Signed)
Pt called in to have rx sent over to [facility]; where he gets his maintenance medication.

## 2019-12-11 ENCOUNTER — Other Ambulatory Visit: Payer: Self-pay

## 2019-12-11 MED ORDER — ROSUVASTATIN CALCIUM 5 MG PO TABS: 5.0000 mg | ORAL_TABLET | Freq: Every day | ORAL | 3 refills | Status: DC

## 2019-12-12 NOTE — Progress Notes (Signed)
Cardiology Office Note:    Date:  12/13/2019   ID:  Collin [name], DOB 27-Jul-1925, MRN [id number]  PCP:  Binnie Rail, MD  Cardiologist:  Mertie Moores, MD  Electrophysiologist:  None   Referring MD: Binnie Rail, MD   Chief Complaint  Patient presents with  . Hospitalization Follow-up    s/p PCI    History of Present Illness:    Collin [name] is a 85 y.o. male with:   Coronary artery disease  S/p Inf MI in 2005 tx with DES to RCA  Unstable angina 12.2020 >> DES to LCx, RCA, PDA  Cardiomyopathy with EF 45-50%  Diabetes mellitus  Hypertension  Hyperlipidemia  Intolerant to atorvastatin  Chronic kidney disease 3-4  Bladder CA  Renal CA s/p L nephrectomy  BPH  COPD  Thrombocytopenia  DJD  Mr. Chausse was last seen by Dr. Acie Fredrickson last month with complaints of chest discomfort consistent with unstable angina.  Cardiac catheterization was arranged.  This demonstrated severe three-vessel CAD with moderately severe disease in the LAD that was fairly stable since 2005 and severe disease in the LCx, RCA and PDA.  He underwent DES to the PDA, mid RCA and mid LCx.  He received hydration to protect him from acute kidney injury in the setting of chronic kidney disease.  Creatinine at discharge is 1.75.  Blood pressure was somewhat low and his losartan was held at discharge.  He was placed on rosuvastatin 5 mg and fenofibrate was stopped.  Fish oil was recommended.  He returns for follow-up.  He is here alone.  He notes improved chest symptoms since his procedure.  He no longer has exertional chest heaviness or pressure.  He does note an occasional "twinge" when he exerts himself.  Overall, this seems to be improving.  He is still short of breath with exertion but this is improved.  He has not had orthopnea, PND, leg swelling.  He has not had syncope.    Prior CV studies:   The following studies were reviewed today:   Cardiac catheterization 11/28/2019 LM dist 50 LAD  prox 70 (calcified), dist 20; D2 70 LCx prox 90; OM2 30 RCA prox 30, mid 80, dist stent 50 ISR; RPDA 95 PCI:  2.5 x 16 mm Synergy DES to mid LCx 2.75 x 28 mm Synergy DES to mid RCA 2.25 x 28 mm Synergy DES to RPDA   Echocardiogram 01/08/2019 EF 45-50, inferolateral hypokinesis, grade 1 diastolic dysfunction, mild MR  Past Medical History:  Diagnosis Date  . Allergy   . Anemia   . Arthritis    gout  . Bladder cancer (Collin [name])   . BPH (benign prostatic hyperplasia)   . CAD (coronary artery disease) 2005   a. diaphragmatic MI s/p DES to RCA 2005, residual disease. b. [place] 11/2019 s/p DES to PDA, mRCA, mid Cx.  . Cancer (Collin [name]) 02/28/2001   Left ureter cancer/bladder  . Carcinoma, renal cell (Collin [name])   . Cardiomyopathy ([place])    a. EF 45-50% by echo 12/2018.  Marland Kitchen Carotid artery disease (Collin [name])    Doppler, September, 2010, normal, no carotid artery disease  . CKD (chronic kidney disease), stage III   . COPD (chronic obstructive pulmonary disease) (Collin [name])   . Dysfunction of eustachian tube   . Ejection fraction    EF 55%, echo, January, 2009  . Encounter for long-term (current) use of other medications   . Enlarged prostate   . GERD (gastroesophageal reflux disease)   .  Gynecomastia    b/l  . HTN (hypertension)   . Hx of cystoscopy 11/02/12   negative  . Hx of skin cancer, basal cell   . Hypercholesterolemia   . Hyperlipidemia   . Low back pain syndrome   . Myocardial infarction (Collin [name])    hx acute  . Nocturia   . Organic impotence   . Pulmonary nodule    Chest CT, followed  . Reactive airway disease   . 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 Staph infection 04/1997  . Thrombocytopenia (Collin [name])    Surgical Hx: The patient  has a past surgical history that includes osteomyelitis; Nephrectomy (02/2001); bladder tumors (2003, 2004, 01/2009); Cholecystectomy (07/2003); Ureterectomy (2003); Corneal transplant (04/2002); Corneal transplant (10/2008); Cardiac catheterization; Back  surgery (02/1996); LEFT HEART CATH AND CORONARY ANGIOGRAPHY (N/A, 11/27/2019); CORONARY STENT INTERVENTION (11/28/2019); and CORONARY STENT INTERVENTION (N/A, 11/28/2019).   Current Medications: Current Meds  Medication Sig  . acetaminophen (TYLENOL) 500 MG tablet Take 500 mg by mouth every 6 (six) hours as needed for moderate pain or headache.   . alfuzosin (UROXATRAL) 10 MG 24 hr tablet Take 10 mg by mouth daily.   Marland Kitchen aspirin EC 81 MG tablet Take 1 tablet (81 mg total) by mouth daily.  . carvedilol (COREG) 3.125 MG tablet Take 1 tablet (3.125 mg total) by mouth 2 (two) times daily.  . clopidogrel (PLAVIX) 75 MG tablet Take 75 mg by mouth daily.  . finasteride (PROSCAR) 5 MG tablet Take 5 mg by mouth daily.  . Multiple Vitamin (MULTIVITAMIN) tablet Take 1 tablet by mouth daily.    . nitroGLYCERIN (NITROSTAT) 0.4 MG SL tablet Place 1 tablet (0.4 mg total) under the tongue every 5 (five) minutes as needed for chest pain.  . Omega-3 1000 MG CAPS Take 1 capsule by mouth daily.  . rosuvastatin (CRESTOR) 5 MG tablet Take 1 tablet (5 mg total) by mouth at bedtime.  . sodium chloride (OCEAN) 0.65 % SOLN nasal spray Place 1 spray into both nostrils as needed for congestion.  . triamcinolone ointment (KENALOG) 0.1 % Apply 1 application topically daily.      Allergies:   Atorvastatin, Peanut oil, and Trandolapril   Social History   Tobacco Use  . Smoking status: Former Smoker    Types: Pipe    Quit date: 12/27/1991    Years since quitting: 27.9  . Smokeless tobacco: Former Systems developer  . Tobacco comment: Quit 20-30 years ago as of 2012  Substance Use Topics  . Alcohol use: No    Alcohol/week: 0.0 standard drinks  . Drug use: No     Family Hx: The patient's family history includes Coronary artery disease in his brother; Diabetes in his brother, mother, sister, and sister; Drug abuse in his brother; Heart failure (age of onset: 62) in his mother.  ROS:   Please see the history of present illness.      ROS All other systems reviewed and are negative.   EKGs/Labs/Other Test Reviewed:    EKG:  EKG is  ordered today.  The ekg ordered today demonstrates sinus bradycardia, heart rate 56, normal axis, inferior Q waves, QTC 389  Recent Labs: 07/08/2019: TSH 3.40 11/01/2019: ALT 15; Pro B Natriuretic peptide (BNP) 31.0 12/02/2019: BUN 38; Creatinine, Ser 1.87; Hemoglobin 12.7; Platelets 133; Potassium 4.5; Sodium 140   Recent Lipid Panel Lab Results  Component Value Date/Time   CHOL 180 07/08/2019 02:21 PM   TRIG 210.0 (H) 07/08/2019 02:21  PM   HDL 41.50 07/08/2019 02:21 PM   CHOLHDL 4 07/08/2019 02:21 PM   LDLCALC 49 02/02/2015 08:41 AM   LDLDIRECT 107.0 07/08/2019 02:21 PM    Physical Exam:    VS:  BP 132/74   Pulse (!) 58   Ht 5\' 7"  (1.702 m)   Wt 167 lb (75.8 kg)   SpO2 98%   BMI 26.16 kg/m     Wt Readings from Last 3 Encounters:  12/13/19 167 lb (75.8 kg)  11/29/19 164 lb 11.2 oz (74.7 kg)  11/20/19 171 lb (77.6 kg)     Physical Exam  Constitutional: He is oriented to person, place, and time. He appears well-developed and well-nourished. No distress.  HENT:  Head: Normocephalic and atraumatic.  Eyes: No scleral icterus.  Neck: No JVD present. No thyromegaly present.  Cardiovascular: Normal rate, regular rhythm and normal heart sounds.  No murmur heard. Pulmonary/Chest: Effort normal and breath sounds normal. He has no rales.  Abdominal: Soft. There is no hepatomegaly.  Musculoskeletal:        General: No edema.     Comments: Right wrist without hematoma  Lymphadenopathy:    He has no cervical adenopathy.  Neurological: He is alert and oriented to person, place, and time.  Skin: Skin is warm and dry.  Psychiatric: He has a normal mood and affect.    ASSESSMENT & PLAN:    1. Coronary artery disease involving native coronary artery of native heart without angina pectoris History of inferior MI in 2005 treated with stenting to the RCA.  He recently underwent  multivessel stenting due to unstable angina.  He underwent DES to the LCx, RCA and PDA.  He has residual disease in the LAD.  His LAD is heavily calcified and appear to be stable from the prior procedure in 2005.  Since discharge, he has done well.  He has not had any further exertional chest heaviness or pressure.  He still has a "twinge" in his chest when he exerts himself.  This seems to be getting better.  He is still short of breath but this is improved.  Continue current therapy which includes aspirin, clopidogrel, carvedilol, rosuvastatin.  Keep follow-up with Dr. Acie Fredrickson in January as planned.  2. Systolic dysfunction EF 47-42 by echocardiogram in January 2020.  He has no clinical signs of congestive heart failure.  Continue carvedilol.  Resume losartan as outlined below.  3. Stage 3b chronic kidney disease Losartan was held in the hospital due to low blood pressure and increasing creatinine.  His pressure has been increasing since discharge from the hospital.  He would benefit from resuming losartan given renal protection and systolic dysfunction.  He will resume losartan 25 mg twice daily.  Obtain BMET today and repeat a BMET in 2 weeks.  4. Essential hypertension Blood pressure is borderline.  Restart losartan as outlined above.  5. Mixed hyperlipidemia He was taken off of fenofibrate in the hospital.  He is now on rosuvastatin 5 mg daily.  He has an intolerance to atorvastatin.  Obtain follow-up lipids and LFTs at next visit with Dr. Acie Fredrickson in January.  Consider increasing to 10 mg or referral to lipid clinic if his LDL is above goal.   Dispo:  Return in 6 weeks (on 01/22/2020) for Scheduled Follow Up, w/ Dr. Acie Fredrickson.   Medication Adjustments/Labs and Tests Ordered: Current medicines are reviewed at length with the patient today.  Concerns regarding medicines are outlined above.  Tests Ordered: Orders Placed This  Encounter  Procedures  . Basic Metabolic Panel (BMET)  . Lipid Profile    . Hepatic function panel  . Basic Metabolic Panel (BMET)  . EKG 12-Lead   Medication Changes: Meds ordered this encounter  Medications  . losartan (COZAAR) 50 MG tablet    Sig: Take 0.5 tablets (25 mg total) by mouth 2 (two) times daily.    Signed, Richardson Dopp, PA-C  12/13/2019 2:05 PM    [facility] [address], [place], [place]  [address] Phone: [phone]; Fax: [phone]

## 2019-12-13 ENCOUNTER — Encounter: Payer: Self-pay | Admitting: Physician Assistant

## 2019-12-13 ENCOUNTER — Other Ambulatory Visit: Payer: Self-pay

## 2019-12-13 ENCOUNTER — Ambulatory Visit (INDEPENDENT_AMBULATORY_CARE_PROVIDER_SITE_OTHER): Payer: Medicare Other | Admitting: Physician Assistant

## 2019-12-13 VITALS — BP 132/74 | HR 58 | Ht 67.0 in | Wt 167.0 lb

## 2019-12-13 DIAGNOSIS — I519 Heart disease, unspecified: Secondary | ICD-10-CM

## 2019-12-13 DIAGNOSIS — E782 Mixed hyperlipidemia: Secondary | ICD-10-CM

## 2019-12-13 DIAGNOSIS — I2510 Atherosclerotic heart disease of native coronary artery without angina pectoris: Secondary | ICD-10-CM

## 2019-12-13 DIAGNOSIS — N1832 Chronic kidney disease, stage 3b: Secondary | ICD-10-CM

## 2019-12-13 DIAGNOSIS — I10 Essential (primary) hypertension: Secondary | ICD-10-CM | POA: Diagnosis not present

## 2019-12-13 MED ORDER — LOSARTAN POTASSIUM 50 MG PO TABS: 25.0000 mg | ORAL_TABLET | Freq: Two times a day (BID) | ORAL | Status: DC

## 2019-12-13 NOTE — Patient Instructions (Addendum)
2Medication Instructions:   Your physician has recommended you make the following change in your medication:   1) Resume Losartan 50MG , 0.5 tablet by mouth twice a day  *If you need a refill on your cardiac medications before your next appointment, please call your pharmacy*  Lab Work:  You will have labs drawn today: BMET and come back on 12/30/2019 at 9:45AM for a repeat BMET  If you have labs (blood work) drawn today and your tests are completely normal, you will receive your results only by: Marland Kitchen MyChart Message (if you have MyChart) OR . A paper copy in the mail If you have any lab test that is abnormal or we need to change your treatment, we will call you to review the results.  Testing/Procedures:  None ordered today  Follow-Up:  Keep you follow up with Dr. Acie Fredrickson on 01/22/20 at 10:40AM, we will check your cholesterol at this appointment.

## 2019-12-14 LAB — BASIC METABOLIC PANEL
BUN/Creatinine Ratio: 22 (ref 10–24)
BUN: 29 mg/dL (ref 10–36)
CO2: 25 mmol/L (ref 20–29)
Calcium: 9.2 mg/dL (ref 8.6–10.2)
Chloride: 105 mmol/L (ref 96–106)
Creatinine, Ser: 1.33 mg/dL — ABNORMAL HIGH (ref 0.76–1.27)
GFR calc Af Amer: 53 mL/min/{1.73_m2} — ABNORMAL LOW (ref >59)
GFR calc non Af Amer: 46 mL/min/{1.73_m2} — ABNORMAL LOW (ref >59)
Glucose: 86 mg/dL (ref 65–99)
Potassium: 4.6 mmol/L (ref 3.5–5.2)
Sodium: 141 mmol/L (ref 134–144)

## 2019-12-19 ENCOUNTER — Telehealth (HOSPITAL_COMMUNITY): Payer: Self-pay

## 2019-12-19 NOTE — Telephone Encounter (Signed)
Called patient to see if he was interested in participating in the Cardiac Rehab Program. Patient stated not at this time.  Closed referral 

## 2019-12-30 ENCOUNTER — Other Ambulatory Visit: Payer: Self-pay

## 2019-12-30 ENCOUNTER — Other Ambulatory Visit: Payer: Medicare Other | Admitting: *Deleted

## 2019-12-30 DIAGNOSIS — E782 Mixed hyperlipidemia: Secondary | ICD-10-CM

## 2019-12-30 DIAGNOSIS — N1832 Chronic kidney disease, stage 3b: Secondary | ICD-10-CM

## 2019-12-30 DIAGNOSIS — I10 Essential (primary) hypertension: Secondary | ICD-10-CM

## 2019-12-30 DIAGNOSIS — I2510 Atherosclerotic heart disease of native coronary artery without angina pectoris: Secondary | ICD-10-CM

## 2019-12-30 DIAGNOSIS — I519 Heart disease, unspecified: Secondary | ICD-10-CM

## 2019-12-30 LAB — BASIC METABOLIC PANEL
BUN/Creatinine Ratio: 12 (ref 10–24)
BUN: 20 mg/dL (ref 10–36)
CO2: 26 mmol/L (ref 20–29)
Calcium: 9.6 mg/dL (ref 8.6–10.2)
Chloride: 103 mmol/L (ref 96–106)
Creatinine, Ser: 1.72 mg/dL — ABNORMAL HIGH (ref 0.76–1.27)
GFR calc Af Amer: 39 mL/min/{1.73_m2} — ABNORMAL LOW (ref >59)
GFR calc non Af Amer: 34 mL/min/{1.73_m2} — ABNORMAL LOW (ref >59)
Glucose: 106 mg/dL — ABNORMAL HIGH (ref 65–99)
Potassium: 4.5 mmol/L (ref 3.5–5.2)
Sodium: 141 mmol/L (ref 134–144)

## 2019-12-30 LAB — LIPID PANEL
Chol/HDL Ratio: 2.8 ratio (ref 0.0–5.0)
Cholesterol, Total: 118 mg/dL (ref 100–199)
HDL: 42 mg/dL (ref >39)
LDL Chol Calc (NIH): 41 mg/dL (ref 0–99)
Triglycerides: 223 mg/dL — ABNORMAL HIGH (ref 0–149)
VLDL Cholesterol Cal: 35 mg/dL (ref 5–40)

## 2019-12-30 LAB — HEPATIC FUNCTION PANEL
ALT: 18 IU/L (ref 0–44)
AST: 17 IU/L (ref 0–40)
Albumin: 4.5 g/dL (ref 3.5–4.6)
Alkaline Phosphatase: 50 IU/L (ref 39–117)
Bilirubin Total: 1.4 mg/dL — ABNORMAL HIGH (ref 0.0–1.2)
Bilirubin, Direct: 0.32 mg/dL (ref 0.00–0.40)
Total Protein: 6.2 g/dL (ref 6.0–8.5)

## 2020-01-02 ENCOUNTER — Telehealth: Payer: Self-pay

## 2020-01-02 DIAGNOSIS — E782 Mixed hyperlipidemia: Secondary | ICD-10-CM

## 2020-01-02 DIAGNOSIS — E781 Pure hyperglyceridemia: Secondary | ICD-10-CM

## 2020-01-02 MED ORDER — OMEGA 3 1200 MG PO CAPS: 2.0000 | ORAL_CAPSULE | Freq: Two times a day (BID) | ORAL | 6 refills | Status: DC

## 2020-01-02 NOTE — Telephone Encounter (Signed)
-----   Message from Liliane Shi, [place] sent at 12/31/2019  5:03 PM EST ----- Creatinine fairly stable.  K+, LFTs normal.  Triglycerides elevated.  LDL optimal.   PLAN:   - Increase Fish Oil to 1000 mg twice daily x 2 weeks, then increase to 2000 mg twice daily   - Repeat BMET at follow up with Dr. Acie Fredrickson  - Fasting Lipids and LFTs in 3 mos. Richardson Dopp, PA-C    12/31/2019 4:55 PM

## 2020-01-02 NOTE — Telephone Encounter (Signed)
Have him try 2 in the AM and 1 in the PM. Richardson Dopp, PA-C    01/02/2020 2:49 PM

## 2020-01-02 NOTE — Telephone Encounter (Signed)
I called and spoke with patient, he is aware of lab results. Patient states that he is already on Fish Oil 1200MG , 1 capsule twice a day. He has been doing that for about 2 weeks now. He will increase to 2400MG  twice a day starting today. Patient is not sure if he will be able to handle taking that dose. He will call or send a mychart message if he has any issues. I will add BMET to follow up with Dr. Acie Fredrickson. If patient can not handle 2 capsules twice a day, would you still like to recheck fasting lipids and LFTs in 3 months?

## 2020-01-02 NOTE — Telephone Encounter (Signed)
I called and spoke with patient, he will try the fish oil 2 capsules twice a day and if he feels like he can not continue this, he will go to 2 capsules in the morning and 1 at night. Patient will come back on 04/03/20 for fasting lipids and LFTs.

## 2020-01-04 ENCOUNTER — Encounter: Payer: Self-pay | Admitting: Internal Medicine

## 2020-01-08 ENCOUNTER — Other Ambulatory Visit: Payer: Self-pay

## 2020-01-08 ENCOUNTER — Ambulatory Visit (INDEPENDENT_AMBULATORY_CARE_PROVIDER_SITE_OTHER): Payer: Medicare Other | Admitting: Internal Medicine

## 2020-01-08 ENCOUNTER — Encounter: Payer: Self-pay | Admitting: Internal Medicine

## 2020-01-08 VITALS — BP 144/78 | HR 62 | Temp 97.7°F | Resp 16 | Ht 67.0 in | Wt 171.0 lb

## 2020-01-08 DIAGNOSIS — E119 Type 2 diabetes mellitus without complications: Secondary | ICD-10-CM

## 2020-01-08 DIAGNOSIS — N1832 Chronic kidney disease, stage 3b: Secondary | ICD-10-CM

## 2020-01-08 DIAGNOSIS — I10 Essential (primary) hypertension: Secondary | ICD-10-CM | POA: Diagnosis not present

## 2020-01-08 DIAGNOSIS — I2510 Atherosclerotic heart disease of native coronary artery without angina pectoris: Secondary | ICD-10-CM

## 2020-01-08 DIAGNOSIS — E785 Hyperlipidemia, unspecified: Secondary | ICD-10-CM

## 2020-01-08 LAB — POCT GLYCOSYLATED HEMOGLOBIN (HGB A1C): Hemoglobin A1C: 5.8 % — AB (ref 4.0–5.6)

## 2020-01-08 NOTE — Patient Instructions (Addendum)
  Your a1c is 5.8% - your sugars are well controlled    Medications reviewed and updated.  Changes include :   none      Please followup in 6 months

## 2020-01-08 NOTE — Assessment & Plan Note (Addendum)
Chronic Check lipid panel  Continue daily statin-on low-dose since he has not tolerated statins in the past Regular exercise and healthy diet encouraged

## 2020-01-08 NOTE — Assessment & Plan Note (Addendum)
Chronic Diet controlled We will check A1c - 5.8% Low sugar/carbohydrate diet Encouraged regular activity Follow-up in 6 months

## 2020-01-08 NOTE — Progress Notes (Signed)
Subjective:    Patient ID: Collin [name], male    DOB: [birth year], 86 y.o.   MRN: [id number]  HPI The patient is here for follow up of their chronic medical problems, including CAD s/p PCI x 3 11/27/2019, hypertension, diabetes, dyslipidemia, CKD,   Last month he had PCI x 3 for unstable angina.  His angina has resolved. He has SOB with strenuous exertion only.   He is taking all of his medications as prescribed.    He is exercising regularly.   He is eating well.  He drinks some fluids - could drink more.   Medications and allergies reviewed with patient and updated if appropriate.  Patient Active Problem List   Diagnosis Date Noted  . Unstable angina ([place])   . Shortness of breath 11/01/2019  . Chest pressure 11/01/2019  . Chest tightness 01/07/2019  . Thrombocytopenia ([place])   . Skin cancer   . Pulmonary nodule   . Nocturia   . Myocardial infarction ([place])   . Low back pain syndrome   . COPD (chronic obstructive pulmonary disease) ([place])   . BPH (benign prostatic hyperplasia)   . Bladder cancer ([place])   . Arthritis   . CKD (chronic kidney disease), stage III 07/10/2017  . Diabetes ([place]) 04/15/2017  . Fatty liver 06/18/2016  . Rash and nonspecific skin eruption 06/13/2016  . H/O bacterial endocarditis 04/06/2016  . Myalgia and myositis 12/18/2015  . Abnormal CT of the abdomen 12/31/2013  . Abnormal chest x-ray 12/31/2013  . Gynecomastia   . Spinal stenosis, lumbar 05/12/2012  . CAD (coronary artery disease)   . Carotid artery disease ([place])   . CERVICAL RADICULOPATHY, LEFT 12/07/2009  . SKIN CANCER, HX OF 12/07/2009  . Renal cell carcinoma ([place]) 07/18/2009  . ANEMIA, 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  . GERD 07/18/2009  . Essential hypertension 04/23/2008  . Dyslipidemia 01/25/2007  . Cancer ([place]) 02/28/2001    Current Outpatient Medications on File Prior to Visit  Medication Sig Dispense Refill  . acetaminophen (TYLENOL) 500 MG tablet Take 500 mg by mouth every 6 (six)  hours as needed for moderate pain or headache.     . alfuzosin (UROXATRAL) 10 MG 24 hr tablet Take 10 mg by mouth daily.     Marland Kitchen aspirin EC 81 MG tablet Take 1 tablet (81 mg total) by mouth daily.    . carvedilol (COREG) 3.125 MG tablet Take 1 tablet (3.125 mg total) by mouth 2 (two) times daily. 180 tablet 3  . clopidogrel (PLAVIX) 75 MG tablet Take 75 mg by mouth daily.    . finasteride (PROSCAR) 5 MG tablet Take 5 mg by mouth daily.    Marland Kitchen losartan (COZAAR) 50 MG tablet Take 0.5 tablets (25 mg total) by mouth 2 (two) times daily.    . Multiple Vitamin (MULTIVITAMIN) tablet Take 1 tablet by mouth daily.      . nitroGLYCERIN (NITROSTAT) 0.4 MG SL tablet Place 1 tablet (0.4 mg total) under the tongue every 5 (five) minutes as needed for chest pain. 25 tablet 6  . Omega 3 1200 MG CAPS Take 2 capsules (2,400 mg total) by mouth 2 (two) times daily. 120 capsule 6  . rosuvastatin (CRESTOR) 5 MG tablet Take 1 tablet (5 mg total) by mouth at bedtime. 90 tablet 3  . sodium chloride (OCEAN) 0.65 % SOLN nasal spray Place 1 spray into both nostrils as needed for congestion.    . triamcinolone ointment (KENALOG) 0.1 % Apply 1 application topically daily.  No current facility-administered medications on file prior to visit.    Past Medical History:  Diagnosis Date  . Allergy   . Anemia   . Arthritis    gout  . Bladder cancer ([place])   . BPH (benign prostatic hyperplasia)   . CAD (coronary artery disease) 2005   a. diaphragmatic MI s/p DES to RCA 2005, residual disease. b. [place] 11/2019 s/p DES to PDA, mRCA, mid Cx.  . Cancer ([place]) 02/28/2001   Left ureter cancer/bladder  . Carcinoma, renal cell ([place])   . Cardiomyopathy ([place])    a. EF 45-50% by echo 12/2018.  Marland Kitchen Carotid artery disease ([place])    Doppler, September, 2010, normal, no carotid artery disease  . CKD (chronic kidney disease), stage III   . COPD (chronic obstructive pulmonary disease) ([place])   . Dysfunction of eustachian tube   . Ejection  fraction    EF 55%, echo, January, 2009  . Encounter for long-term (current) use of other medications   . Enlarged prostate   . GERD (gastroesophageal reflux disease)   . Gynecomastia    b/l  . HTN (hypertension)   . Hx of cystoscopy 11/02/12   negative  . Hx of skin cancer, basal cell   . Hypercholesterolemia   . Hyperlipidemia   . Low back pain syndrome   . Myocardial infarction ([place])    hx acute  . Nocturia   . Organic impotence   . Pulmonary nodule    Chest CT, followed  . Reactive airway disease   . 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 Staph infection 04/1997  . Thrombocytopenia ([place])     Past Surgical History:  Procedure Laterality Date  . BACK SURGERY  02/1996  . bladder tumors  2003, 2004, 01/2009   removed  . CARDIAC CATHETERIZATION    . CHOLECYSTECTOMY  07/2003  . 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 CORONARY STENT INTERVENTION  11/28/2019  . CORONARY STENT INTERVENTION N/A 11/28/2019   Procedure: CORONARY STENT INTERVENTION;  Surgeon: Burnell Blanks, MD;  Location: [facility];  Service: Cardiovascular;  Laterality: N/A;  . LEFT HEART CATH AND CORONARY ANGIOGRAPHY N/A 11/27/2019   Procedure: LEFT HEART CATH AND CORONARY ANGIOGRAPHY;  Surgeon: Burnell Blanks, MD;  Location: [facility];  Service: Cardiovascular;  Laterality: N/A;  . NEPHRECTOMY  02/2001   left kidney, ureter  . osteomyelitis     staph aureus  . URETERECTOMY  2003   for cancer    Social History   Socioeconomic History  . Marital status: Widowed    Spouse name: Not on file  . Number of children: 1  . Years of education: Not on file  . Highest education level: Not on file  Occupational History  . 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 Smoking status: Former Smoker    Types: Pipe    Quit date: 12/27/1991    Years since quitting: 28.0  . Smokeless tobacco:  Former Systems developer  . Tobacco comment: Quit 20-30 years ago as of 2012  Substance and Sexual Activity  . Alcohol use: No    Alcohol/week: 0.0 standard drinks  . Drug use: No  . Sexual activity: Not Currently  Other Topics Concern  . Not on file  Social History Narrative   30 minutes a day for 5 days a week  Social Determinants of Health   Financial Resource Strain:   . Difficulty of Paying Living Expenses: Not on file  Food Insecurity:   . Worried About Charity fundraiser in the Last Year: Not on file  . Ran Out of Food in the Last Year: Not on file  Transportation Needs:   . Lack of Transportation (Medical): Not on file  . Lack of Transportation (Non-Medical): Not on file  Physical Activity: Inactive  . Days of Exercise per Week: 0 days  . Minutes of Exercise per Session: 0 min  Stress:   . Feeling of Stress : Not on file  Social Connections: Unknown  . Frequency of Communication with Friends and Family: Not on file  . Frequency of Social Gatherings with Friends and Family: Not on file  . Attends Religious Services: 1 to 4 times per year  . Active Member of Clubs or Organizations: Not on file  . Attends Archivist Meetings: 1 to 4 times per year  . Marital Status: Not on file    Family History  Problem Relation Age of Onset  . Heart failure Mother 23  . Diabetes Mother   . Coronary artery disease Brother   . Diabetes Brother   . Drug abuse Brother   . Diabetes Sister   . Diabetes Sister     Review of Systems  Constitutional: Negative for chills and fever.  Respiratory: Positive for shortness of breath (only with strenuous exertion). Negative for cough and wheezing.   Cardiovascular: Negative for chest pain, palpitations and leg swelling.  Gastrointestinal:       No gerd  Neurological: Negative for light-headedness and headaches.       Objective:   Vitals:   01/08/20 1330  BP: (!) 144/78  Pulse: 62  Resp: 16  Temp: 97.7 F (36.5 C)  SpO2: 96%    BP Readings from Last 3 Encounters:  01/08/20 (!) 144/78  12/13/19 132/74  11/29/19 122/63   Wt Readings from Last 3 Encounters:  01/08/20 171 lb (77.6 kg)  12/13/19 167 lb (75.8 kg)  11/29/19 164 lb 11.2 oz (74.7 kg)   Body mass index is 26.78 kg/m.   Physical Exam    Constitutional: Appears well-developed and well-nourished. No distress.  HENT:  Head: Normocephalic and atraumatic.  Neck: Neck supple. No tracheal deviation present. No thyromegaly present.  No cervical lymphadenopathy Cardiovascular: Normal rate, regular rhythm and normal heart sounds.   No murmur heard. No carotid bruit .  No edema Pulmonary/Chest: Effort normal and breath sounds normal. No respiratory distress. No has no wheezes. No rales.  Skin: Skin is warm and dry. Not diaphoretic.  Psychiatric: Normal mood and affect. Behavior is normal.      Assessment & Plan:    See Problem List for Assessment and Plan of chronic medical problems.    This visit occurred during the SARS-CoV-2 public health emergency.  Safety protocols were in place, including screening questions prior to the visit, additional usage of staff PPE, and extensive cleaning of exam room while observing appropriate contact time as indicated for disinfecting solutions.

## 2020-01-08 NOTE — Assessment & Plan Note (Signed)
Chronic BP well controlled Current regimen effective and well tolerated Continue current medications at current doses  

## 2020-01-08 NOTE — Assessment & Plan Note (Signed)
Chronic, stable Recent CMP reviewed Encourage increased fluids Does not take any NSAIDs

## 2020-01-08 NOTE — Assessment & Plan Note (Signed)
Chronic Had DES x3 placed 11/2019 for unstable angina Since then feeling much better.  No chest pain, shortness of breath Following with cardiology Continue current medications

## 2020-01-22 ENCOUNTER — Ambulatory Visit (INDEPENDENT_AMBULATORY_CARE_PROVIDER_SITE_OTHER): Payer: Medicare Other | Admitting: Cardiovascular Disease

## 2020-01-22 ENCOUNTER — Other Ambulatory Visit: Payer: Self-pay

## 2020-01-22 ENCOUNTER — Encounter: Payer: Self-pay | Admitting: Cardiovascular Disease

## 2020-01-22 VITALS — BP 114/56 | HR 65 | Ht 67.0 in | Wt 170.4 lb

## 2020-01-22 DIAGNOSIS — E782 Mixed hyperlipidemia: Secondary | ICD-10-CM

## 2020-01-22 DIAGNOSIS — I2510 Atherosclerotic heart disease of native coronary artery without angina pectoris: Secondary | ICD-10-CM | POA: Diagnosis not present

## 2020-01-22 DIAGNOSIS — I200 Unstable angina: Secondary | ICD-10-CM | POA: Diagnosis not present

## 2020-01-22 NOTE — Progress Notes (Signed)
Cardiology Office Note   Date:  01/22/2020   ID:  SNEIJDER Collin [name], DOB [birth year], MRN [id number]  PCP:  Binnie Rail, MD  Cardiologist:  Mertie Moores, MD , previous patient of Dr. Ron Parker  No chief complaint on file.  Problem List 1. CAD- s/p stenting in 2005.  2. Essential HTN       Collin [name] is a 86 y.o. male who presents for follow-up of coronary disease.  Had a stent placed by Dr. Olevia Perches in 2005.  No CP , no dyspnea   Previously worked  in the [employer] organ[employer] for [name] active Walks 30 minutes a day on the treadmill.   Goes bowling twice a week Took Atorvastatin for years but stopped due to leg pain .  Last lipids were drawn in Dec.  - showed markedly elevated Trig levels and high chol levels.   April 21, 2017:  [name] has a hx of CAD Hyperlipidemia Was recently diagnosed with DM and He has greatly improved his diet. He's cut back on lots of carbohydrates. He's lost about 5 pounds. He's noticed that his blood pressure has been low. This morning he took only half of his valsartan tablet.  Takes SBE prophylasix - has done so for years.  No prosthetic valve and no artificial joints  Jan. 8, 2019:  [name] is seen back for follow-up visit for his coronary artery disease and diabetes mellitus.   He walks on the treadmill 4-5 times a week for 30 minutes at a time.  He denies any angina while walking the treadmill.  He still bowls once or twice a week.  He notes that his blood pressure has been somewhat variable.  It seems to go up in the evenings.  Eats prepared meals every meal.      July 24, 2018:    [name] is seen today for follow up of his CAD Has pain in his calves with walking -  Resolves with walking  Had a [employer] screening of PAD screening  No CP  Still works out on the treadmill, bowl one a week   Recent lab work shows that his triglyceride level is markedly elevated at 613.  Total cholesterol is 179.  His LDL is 77. Eats  lots of cereal, through the day .     January 21, 2019:  Strained his chest recently while lifting a large box Has had some shortness of breath  Saw his primary   Echo shows a mildly depressed left ventricular systolic function with an ejection fraction of 45 to 50%.  There is hypokinesis of the inferior wall.  He has grade 1 diastolic dysfunction.  He has mild mitral regurgitation. The pain was clearly MSK.    Feeling better.   Breathing seems to be better .   November 20, 2019:   Collin [name] is seen today for a follow-up visit. He was recently seen by Collin Lis, PA with symptoms consistent with unstable angina.  He wanted to discuss it further and is seeing me today for further evaluation.  Pain / heaviness  - center of chest  Worse with walking or carrying groceries from car Had a stent in 2005.  This current pain is similar to his angina .  No diaphoresis,  No syncope.  No presyncope  Last for several minutes.  Or until he stops to rest  Was raking leaves for 20 min and had angina   January 22, 2020:  [name] seen back for a follow-up visit.  He was complaining of angina when I saw him back in November, 2020. He was found to have significant disease in his right coronary artery and circumflex artery.  He had a stent placed in his mid right coronary artery as well as the posterior descending artery.  He also had a stent placed in the mid circumflex artery. The left anterior descending artery has a moderate to severe stenosis that has been treated medically. He is greatly improved.  The chest pressure has resolved.  Has not had to take any NTG. Still has some dyspnea.  Walks on the treadmill without any issues.   Was not able to do cardiac rehab due to covid.   He saw Collin Dopp, PA on January 4.  His renal function was fairly stable.  Creatinine was 1.72.  Potassium is 4.5.  Total cholesterol is 118.  HDL is 42.  Triglyceride levels 223.  The LDL is 41.   Past Medical  History:  Diagnosis Date  . Allergy   . Anemia   . Arthritis    gout  . Bladder cancer ([place])   . BPH (benign prostatic hyperplasia)   . CAD (coronary artery disease) 2005   a. diaphragmatic MI s/p DES to RCA 2005, residual disease. b. [place] 11/2019 s/p DES to PDA, mRCA, mid Cx.  . Cancer (Collin [name]) 02/28/2001   Left ureter cancer/bladder  . Carcinoma, renal cell ([place])   . Cardiomyopathy ([place])    a. EF 45-50% by echo 12/2018.  Marland Kitchen Carotid artery disease ([place])    Doppler, September, 2010, normal, no carotid artery disease  . CKD (chronic kidney disease), stage III   . COPD (chronic obstructive pulmonary disease) ([place])   . Dysfunction of eustachian tube   . Ejection fraction    EF 55%, echo, January, 2009  . Encounter for long-term (current) use of other medications   . Enlarged prostate   . GERD (gastroesophageal reflux disease)   . Gynecomastia    b/l  . HTN (hypertension)   . Hx of cystoscopy 11/02/12   negative  . Hx of skin cancer, basal cell   . Hypercholesterolemia   . Hyperlipidemia   . Low back pain syndrome   . Myocardial infarction ([place])    hx acute  . Nocturia   . Organic impotence   . Pulmonary nodule    Chest CT, followed  . Reactive airway disease   . 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 Staph infection 04/1997  . Thrombocytopenia ([place])     Past Surgical History:  Procedure Laterality Date  . BACK SURGERY  02/1996  . bladder tumors  2003, 2004, 01/2009   removed  . CARDIAC CATHETERIZATION    . CHOLECYSTECTOMY  07/2003  . 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 CORONARY STENT INTERVENTION  11/28/2019  . CORONARY STENT INTERVENTION N/A 11/28/2019   Procedure: CORONARY STENT INTERVENTION;  Surgeon: Burnell Blanks, MD;  Location: [facility];  Service: Cardiovascular;  Laterality: N/A;  . LEFT HEART CATH AND CORONARY ANGIOGRAPHY N/A 11/27/2019   Procedure: LEFT HEART CATH AND  CORONARY ANGIOGRAPHY;  Surgeon: Burnell Blanks, MD;  Location: [facility];  Service: Cardiovascular;  Laterality: N/A;  . NEPHRECTOMY  02/2001   left kidney, ureter  . osteomyelitis     staph aureus  .  URETERECTOMY  2003   for cancer    Patient Active Problem List   Diagnosis Date Noted  . CAD (coronary artery disease)     Priority: High  . Essential hypertension 04/23/2008    Priority: High  . Dyslipidemia 01/25/2007    Priority: High  . Unstable angina ([place])   . Shortness of breath 11/01/2019  . Chest pressure 11/01/2019  . Chest tightness 01/07/2019  . Thrombocytopenia ([place])   . Skin cancer   . Pulmonary nodule   . Nocturia   . Myocardial infarction ([place])   . Low back pain syndrome   . COPD (chronic obstructive pulmonary disease) ([place])   . BPH (benign prostatic hyperplasia)   . Bladder cancer ([place])   . Arthritis   . CKD (chronic kidney disease), stage III 07/10/2017  . Diabetes ([place]) 04/15/2017  . Fatty liver 06/18/2016  . Rash and nonspecific skin eruption 06/13/2016  . H/O bacterial endocarditis 04/06/2016  . Myalgia and myositis 12/18/2015  . Abnormal CT of the abdomen 12/31/2013  . Abnormal chest x-ray 12/31/2013  . Gynecomastia   . Spinal stenosis, lumbar 05/12/2012  . Carotid artery disease ([place])   . CERVICAL RADICULOPATHY, LEFT 12/07/2009  . SKIN CANCER, HX OF 12/07/2009  . Renal cell carcinoma ([place]) 07/18/2009  . ANEMIA, 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  . GERD 07/18/2009  . Cancer ([place]) 02/28/2001      Current Outpatient Medications  Medication Sig Dispense Refill  . acetaminophen (TYLENOL) 500 MG tablet Take 500 mg by mouth every 6 (six) hours as needed for moderate pain or headache.     . alfuzosin (UROXATRAL) 10 MG 24 hr tablet Take 10 mg by mouth daily.     Marland Kitchen aspirin EC 81 MG tablet Take 1 tablet (81 mg total) by mouth daily.    . carvedilol (COREG) 3.125 MG tablet Take 1 tablet (3.125 mg total) by mouth 2 (two) times daily. 180 tablet 3    . clopidogrel (PLAVIX) 75 MG tablet Take 75 mg by mouth daily.    . finasteride (PROSCAR) 5 MG tablet Take 5 mg by mouth daily.    Marland Kitchen losartan (COZAAR) 50 MG tablet Take 0.5 tablets (25 mg total) by mouth 2 (two) times daily.    . Multiple Vitamin (MULTIVITAMIN) tablet Take 1 tablet by mouth daily.      . nitroGLYCERIN (NITROSTAT) 0.4 MG SL tablet Place 1 tablet (0.4 mg total) under the tongue every 5 (five) minutes as needed for chest pain. 25 tablet 6  . Omega 3 1200 MG CAPS Take 2 capsules (2,400 mg total) by mouth 2 (two) times daily. 120 capsule 6  . rosuvastatin (CRESTOR) 5 MG tablet Take 1 tablet (5 mg total) by mouth at bedtime. 90 tablet 3  . sodium chloride (OCEAN) 0.65 % SOLN nasal spray Place 1 spray into both nostrils as needed for congestion.    . triamcinolone ointment (KENALOG) 0.1 % Apply 1 application topically daily.      No current facility-administered medications for this visit.    Allergies:   Atorvastatin, Peanut oil, and Trandolapril    Social History:  The patient  reports that he quit smoking about 28 years ago. His smoking use included pipe. He has quit using smokeless tobacco. He reports that he does not drink alcohol or use drugs.   Family History:  The patient's family history includes Coronary artery disease in his brother; Diabetes in his brother, mother, sister, and sister; Drug abuse in his brother; Heart failure (age  of onset: 82) in his mother.    ROS:  Please see the history of present illness.  Patient denies fever, chills, headache, sweats, rash, change in vision, change in hearing, chest pain, cough, nausea or vomiting, urinary symptoms. All other systems are reviewed and are negative.    Physical Exam: Blood pressure (!) 114/56, pulse 65, height 5\' 7"  (1.702 m), weight 170 lb 6.4 oz (77.3 kg), SpO2 93 %.  GEN:  Elderly male,  NAD  HEENT: Normal NECK: No JVD; No carotid bruits LYMPHATICS: No lymphadenopathy CARDIAC: RRR , no murmurs, rubs,  gallops RESPIRATORY:  Clear to auscultation without rales, wheezing or rhonchi  ABDOMEN: Soft, non-tender, non-distended MUSCULOSKELETAL:  No edema; No deformity  SKIN: Warm and dry NEUROLOGIC:  Alert and oriented x 3    EKG:     Recent Labs: 07/08/2019: TSH 3.40 11/01/2019: Pro B Natriuretic peptide (BNP) 31.0 12/02/2019: Hemoglobin 12.7; Platelets 133 12/30/2019: ALT 18; BUN 20; Creatinine, Ser 1.72; Potassium 4.5; Sodium 141    Lipid Panel    Component Value Date/Time   CHOL 118 12/30/2019 0943   TRIG 223 (H) 12/30/2019 0943   HDL 42 12/30/2019 0943   CHOLHDL 2.8 12/30/2019 0943   CHOLHDL 4 07/08/2019 1421   VLDL 42.0 (H) 07/08/2019 1421   LDLCALC 41 12/30/2019 0943   LDLDIRECT 107.0 07/08/2019 1421      Wt Readings from Last 3 Encounters:  01/22/20 170 lb 6.4 oz (77.3 kg)  01/08/20 171 lb (77.6 kg)  12/13/19 167 lb (75.8 kg)      Current medicines are reviewed. The patient has a very good understanding of his medications. No changes are to be made.       ASSESSMENT AND PLAN:  1. CAD-he is feeling much better after PCI of his RCA and circumflex artery.  His LAD had moderate disease and was left alone.  He seems to be doing very well.  2. Essential HTN-blood pressures well controlled.  Continue current medications.  3. Hyperlipidemia -   continue rosuvastatin.  I will check lipids, liver enzymes, basic metabolic profile when I see him again in 6 months.   Mertie Moores, MD  01/22/2020 11:13 AM    [address],  [place], [place]  [phone] Pager [phone] Phone: [phone]; Fax: [phone]

## 2020-01-22 NOTE — Patient Instructions (Signed)
Medication Instructions:  Your physician recommends that you continue on your current medications as directed. Please refer to the Current Medication list given to you today.  *If you need a refill on your cardiac medications before your next appointment, please call your pharmacy*  Lab Work: You will need fasting labs prior to or at the time of your next appointment.   If you have labs (blood work) drawn today and your tests are completely normal, you will receive your results only by: Marland Kitchen MyChart Message (if you have MyChart) OR . A paper copy in the mail If you have any lab test that is abnormal or we need to change your treatment, we will call you to review the results.  Testing/Procedures: None  Follow-Up: At [facility], you and your health needs are our priority.  As part of our continuing mission to provide you with exceptional heart care, we have created designated Provider Care Teams.  These Care Teams include your primary Cardiologist (physician) and Advanced Practice Providers (APPs -  Physician Assistants and Nurse Practitioners) who all work together to provide you with the care you need, when you need it.  Your next appointment:   6 month(s)  The format for your next appointment:   In Person  Provider:   You may see Mertie Moores, MD or one of the following Advanced Practice Providers on your designated Care Team:    Richardson Dopp, PA-C  [employer], [place]  Daune Perch, NP   Other Instructions

## 2020-02-25 ENCOUNTER — Other Ambulatory Visit: Payer: Self-pay

## 2020-02-26 ENCOUNTER — Other Ambulatory Visit: Payer: Self-pay | Admitting: *Deleted

## 2020-02-26 MED ORDER — CLOPIDOGREL BISULFATE 75 MG PO TABS: 75.0000 mg | ORAL_TABLET | Freq: Every day | ORAL | 3 refills | Status: DC

## 2020-02-26 MED ORDER — LOSARTAN POTASSIUM 50 MG PO TABS: 25.0000 mg | ORAL_TABLET | Freq: Two times a day (BID) | ORAL | 3 refills | Status: DC

## 2020-03-13 ENCOUNTER — Ambulatory Visit (INDEPENDENT_AMBULATORY_CARE_PROVIDER_SITE_OTHER): Payer: Medicare Other | Admitting: Podiatry

## 2020-03-13 ENCOUNTER — Encounter: Payer: Self-pay | Admitting: Podiatry

## 2020-03-13 ENCOUNTER — Other Ambulatory Visit: Payer: Self-pay

## 2020-03-13 VITALS — Temp 95.4°F

## 2020-03-13 DIAGNOSIS — B351 Tinea unguium: Secondary | ICD-10-CM

## 2020-03-13 DIAGNOSIS — M79675 Pain in left toe(s): Secondary | ICD-10-CM

## 2020-03-13 DIAGNOSIS — M79674 Pain in right toe(s): Secondary | ICD-10-CM

## 2020-03-13 DIAGNOSIS — D689 Coagulation defect, unspecified: Secondary | ICD-10-CM

## 2020-03-13 NOTE — Progress Notes (Signed)
Patient left prior to being seen today.  Patient has rescheduled.   Gardiner Barefoot DPM

## 2020-03-25 ENCOUNTER — Ambulatory Visit (INDEPENDENT_AMBULATORY_CARE_PROVIDER_SITE_OTHER): Payer: Medicare Other | Admitting: Podiatry

## 2020-03-25 ENCOUNTER — Other Ambulatory Visit: Payer: Self-pay

## 2020-03-25 ENCOUNTER — Encounter: Payer: Self-pay | Admitting: Podiatry

## 2020-03-25 VITALS — Temp 96.7°F

## 2020-03-25 DIAGNOSIS — M79675 Pain in left toe(s): Secondary | ICD-10-CM

## 2020-03-25 DIAGNOSIS — N1832 Chronic kidney disease, stage 3b: Secondary | ICD-10-CM | POA: Diagnosis not present

## 2020-03-25 DIAGNOSIS — B351 Tinea unguium: Secondary | ICD-10-CM

## 2020-03-25 DIAGNOSIS — D689 Coagulation defect, unspecified: Secondary | ICD-10-CM

## 2020-03-25 DIAGNOSIS — E119 Type 2 diabetes mellitus without complications: Secondary | ICD-10-CM | POA: Diagnosis not present

## 2020-03-25 DIAGNOSIS — M79674 Pain in right toe(s): Secondary | ICD-10-CM

## 2020-03-25 NOTE — Progress Notes (Signed)
This patient returns to my office for at risk foot care.  This patient requires this care by a professional since this patient will be at risk due to having thrombocytopenia, CKD, and diabetes.  This patient is unable to cut nails himself since the patient cannot reach his nails.These nails are painful walking and wearing shoes.  This patient presents for at risk foot care today. Patient has not been seen in over 1 1/2 years. Patient is taking plavix.  General Appearance  Alert, conversant and in no acute stress.  Vascular  Dorsalis pedis and posterior tibial  pulses are palpable  bilaterally.  Capillary return is within normal limits  bilaterally. Temperature is within normal limits  bilaterally.  Neurologic  Senn-Weinstein monofilament wire test within normal limits  bilaterally. Muscle power within normal limits bilaterally.  Nails Thick disfigured discolored nails with subungual debris  from hallux to fifth toes bilaterally. No evidence of bacterial infection or drainage bilaterally.  Orthopedic  No limitations of motion  feet .  No crepitus or effusions noted.  No bony pathology or digital deformities noted.  Skin  normotropic skin with no porokeratosis noted bilaterally.  No signs of infections or ulcers noted.     Onychomycosis  Pain in right toes  Pain in left toes  Consent was obtained for treatment procedures.   Mechanical debridement of nails 1-5  bilaterally performed with a nail nipper.  Filed with dremel without incident.    Return office visit    3 months                  Told patient to return for periodic foot care and evaluation due to potential at risk complications.   Gardiner Barefoot DPM

## 2020-04-03 ENCOUNTER — Other Ambulatory Visit: Payer: Medicare Other | Admitting: *Deleted

## 2020-04-03 ENCOUNTER — Other Ambulatory Visit: Payer: Self-pay

## 2020-04-03 DIAGNOSIS — E781 Pure hyperglyceridemia: Secondary | ICD-10-CM

## 2020-04-03 DIAGNOSIS — E782 Mixed hyperlipidemia: Secondary | ICD-10-CM

## 2020-04-03 LAB — LIPID PANEL
Chol/HDL Ratio: 3.2 ratio (ref 0.0–5.0)
Cholesterol, Total: 116 mg/dL (ref 100–199)
HDL: 36 mg/dL — ABNORMAL LOW (ref >39)
LDL Chol Calc (NIH): 43 mg/dL (ref 0–99)
Triglycerides: 233 mg/dL — ABNORMAL HIGH (ref 0–149)
VLDL Cholesterol Cal: 37 mg/dL (ref 5–40)

## 2020-04-03 LAB — BASIC METABOLIC PANEL
BUN/Creatinine Ratio: 16 (ref 10–24)
BUN: 24 mg/dL (ref 10–36)
CO2: 24 mmol/L (ref 20–29)
Calcium: 9.3 mg/dL (ref 8.6–10.2)
Chloride: 104 mmol/L (ref 96–106)
Creatinine, Ser: 1.46 mg/dL — ABNORMAL HIGH (ref 0.76–1.27)
GFR calc Af Amer: 48 mL/min/{1.73_m2} — ABNORMAL LOW (ref >59)
GFR calc non Af Amer: 41 mL/min/{1.73_m2} — ABNORMAL LOW (ref >59)
Glucose: 142 mg/dL — ABNORMAL HIGH (ref 65–99)
Potassium: 4.4 mmol/L (ref 3.5–5.2)
Sodium: 142 mmol/L (ref 134–144)

## 2020-04-03 LAB — HEPATIC FUNCTION PANEL
ALT: 21 IU/L (ref 0–44)
AST: 16 IU/L (ref 0–40)
Albumin: 4.4 g/dL (ref 3.5–4.6)
Alkaline Phosphatase: 57 IU/L (ref 39–117)
Bilirubin Total: 1.5 mg/dL — ABNORMAL HIGH (ref 0.0–1.2)
Bilirubin, Direct: 0.33 mg/dL (ref 0.00–0.40)
Total Protein: 6.2 g/dL (ref 6.0–8.5)

## 2020-05-11 ENCOUNTER — Telehealth: Payer: Self-pay | Admitting: Cardiovascular Disease

## 2020-05-11 DIAGNOSIS — E781 Pure hyperglyceridemia: Secondary | ICD-10-CM

## 2020-05-11 DIAGNOSIS — E782 Mixed hyperlipidemia: Secondary | ICD-10-CM

## 2020-05-11 DIAGNOSIS — I2510 Atherosclerotic heart disease of native coronary artery without angina pectoris: Secondary | ICD-10-CM

## 2020-05-11 DIAGNOSIS — D696 Thrombocytopenia, unspecified: Secondary | ICD-10-CM

## 2020-05-11 DIAGNOSIS — D649 Anemia, unspecified: Secondary | ICD-10-CM

## 2020-05-11 DIAGNOSIS — I10 Essential (primary) hypertension: Secondary | ICD-10-CM

## 2020-05-11 NOTE — Telephone Encounter (Signed)
Per appt notes for the patient's 07/27/20 appt it states he needs Lipid, Liver and BMET lab work done the day of or prior to the appt but there are no orders in.

## 2020-05-12 NOTE — Telephone Encounter (Signed)
Called patient and scheduled lab appointment for 7/23. He will see his PCP on 7/27 and Dr. Acie Fredrickson on 8/2 and I advised that lab results will be available to both providers by the day of his appointment. He thanked me for the call.

## 2020-06-24 ENCOUNTER — Other Ambulatory Visit: Payer: Self-pay

## 2020-06-24 ENCOUNTER — Encounter: Payer: Self-pay | Admitting: Podiatry

## 2020-06-24 ENCOUNTER — Ambulatory Visit (INDEPENDENT_AMBULATORY_CARE_PROVIDER_SITE_OTHER): Payer: Medicare Other | Admitting: Podiatry

## 2020-06-24 DIAGNOSIS — D689 Coagulation defect, unspecified: Secondary | ICD-10-CM

## 2020-06-24 DIAGNOSIS — M79675 Pain in left toe(s): Secondary | ICD-10-CM

## 2020-06-24 DIAGNOSIS — M79674 Pain in right toe(s): Secondary | ICD-10-CM

## 2020-06-24 DIAGNOSIS — E119 Type 2 diabetes mellitus without complications: Secondary | ICD-10-CM | POA: Diagnosis not present

## 2020-06-24 DIAGNOSIS — N1832 Chronic kidney disease, stage 3b: Secondary | ICD-10-CM

## 2020-06-24 DIAGNOSIS — B351 Tinea unguium: Secondary | ICD-10-CM

## 2020-06-24 NOTE — Progress Notes (Signed)
This patient returns to my office for at risk foot care.  This patient requires this care by a professional since this patient will be at risk due to having thrombocytopenia, CKD, and diabetes.  This patient is unable to cut nails himself since the patient cannot reach his nails.These nails are painful walking and wearing shoes.  This patient presents for at risk foot care today. . Patient is taking plavix.  General Appearance  Alert, conversant and in no acute stress.  Vascular  Dorsalis pedis and posterior tibial  pulses are palpable  bilaterally.  Capillary return is within normal limits  bilaterally. Temperature is within normal limits  bilaterally.  Neurologic  Senn-Weinstein monofilament wire test within normal limits  bilaterally. Muscle power within normal limits bilaterally.  Nails Thick disfigured discolored nails with subungual debris  from hallux to fifth toes bilaterally. No evidence of bacterial infection or drainage bilaterally.  Orthopedic  No limitations of motion  feet .  No crepitus or effusions noted.  No bony pathology or digital deformities noted.  HAV  B/L.  Skin  normotropic skin with no porokeratosis noted bilaterally.  No signs of infections or ulcers noted.     Onychomycosis  Pain in right toes  Pain in left toes  Consent was obtained for treatment procedures.   Mechanical debridement of nails 1-5  bilaterally performed with a nail nipper.  Filed with dremel without incident.    Return office visit    3 months                  Told patient to return for periodic foot care and evaluation due to potential at risk complications.   Dartanian Knaggs DPM  

## 2020-07-13 ENCOUNTER — Ambulatory Visit: Payer: Medicare Other | Admitting: Internal Medicine

## 2020-07-17 ENCOUNTER — Other Ambulatory Visit: Payer: Medicare Other | Admitting: *Deleted

## 2020-07-17 ENCOUNTER — Other Ambulatory Visit: Payer: Self-pay

## 2020-07-17 DIAGNOSIS — I2510 Atherosclerotic heart disease of native coronary artery without angina pectoris: Secondary | ICD-10-CM

## 2020-07-17 DIAGNOSIS — I10 Essential (primary) hypertension: Secondary | ICD-10-CM

## 2020-07-17 DIAGNOSIS — D696 Thrombocytopenia, unspecified: Secondary | ICD-10-CM

## 2020-07-17 DIAGNOSIS — D649 Anemia, unspecified: Secondary | ICD-10-CM

## 2020-07-17 DIAGNOSIS — E782 Mixed hyperlipidemia: Secondary | ICD-10-CM

## 2020-07-17 DIAGNOSIS — E781 Pure hyperglyceridemia: Secondary | ICD-10-CM

## 2020-07-17 LAB — BASIC METABOLIC PANEL
BUN/Creatinine Ratio: 14 (ref 10–24)
BUN: 20 mg/dL (ref 10–36)
CO2: 24 mmol/L (ref 20–29)
Calcium: 8.7 mg/dL (ref 8.6–10.2)
Chloride: 105 mmol/L (ref 96–106)
Creatinine, Ser: 1.47 mg/dL — ABNORMAL HIGH (ref 0.76–1.27)
GFR calc Af Amer: 47 mL/min/{1.73_m2} — ABNORMAL LOW (ref >59)
GFR calc non Af Amer: 41 mL/min/{1.73_m2} — ABNORMAL LOW (ref >59)
Glucose: 87 mg/dL (ref 65–99)
Potassium: 4.5 mmol/L (ref 3.5–5.2)
Sodium: 141 mmol/L (ref 134–144)

## 2020-07-17 LAB — CBC WITH DIFFERENTIAL/PLATELET
Basophils Absolute: 0 10*3/uL (ref 0.0–0.2)
Basos: 1 %
EOS (ABSOLUTE): 0.2 10*3/uL (ref 0.0–0.4)
Eos: 4 %
Hematocrit: 39.1 % (ref 37.5–51.0)
Hemoglobin: 13.4 g/dL (ref 13.0–17.7)
Immature Grans (Abs): 0 10*3/uL (ref 0.0–0.1)
Immature Granulocytes: 1 %
Lymphocytes Absolute: 1.5 10*3/uL (ref 0.7–3.1)
Lymphs: 40 %
MCH: 31.2 pg (ref 26.6–33.0)
MCHC: 34.3 g/dL (ref 31.5–35.7)
MCV: 91 fL (ref 79–97)
Monocytes Absolute: 0.6 10*3/uL (ref 0.1–0.9)
Monocytes: 18 %
Neutrophils Absolute: 1.3 10*3/uL — ABNORMAL LOW (ref 1.4–7.0)
Neutrophils: 36 %
Platelets: 116 10*3/uL — ABNORMAL LOW (ref 150–450)
RBC: 4.29 x10E6/uL (ref 4.14–5.80)
RDW: 12.9 % (ref 11.6–15.4)
WBC: 3.6 10*3/uL (ref 3.4–10.8)

## 2020-07-17 LAB — LIPID PANEL
Chol/HDL Ratio: 4.3 ratio (ref 0.0–5.0)
Cholesterol, Total: 149 mg/dL (ref 100–199)
HDL: 35 mg/dL — ABNORMAL LOW (ref >39)
LDL Chol Calc (NIH): 79 mg/dL (ref 0–99)
Triglycerides: 206 mg/dL — ABNORMAL HIGH (ref 0–149)
VLDL Cholesterol Cal: 35 mg/dL (ref 5–40)

## 2020-07-17 LAB — HEPATIC FUNCTION PANEL
ALT: 17 IU/L (ref 0–44)
AST: 13 IU/L (ref 0–40)
Albumin: 4.2 g/dL (ref 3.5–4.6)
Alkaline Phosphatase: 61 IU/L (ref 48–121)
Bilirubin Total: 0.8 mg/dL (ref 0.0–1.2)
Bilirubin, Direct: 0.19 mg/dL (ref 0.00–0.40)
Total Protein: 6.1 g/dL (ref 6.0–8.5)

## 2020-07-20 ENCOUNTER — Ambulatory Visit: Payer: Medicare Other | Admitting: Internal Medicine

## 2020-07-20 NOTE — Progress Notes (Signed)
Subjective:    Patient ID: Collin [name], male    DOB: September 02, 1925, 86 y.o.   MRN: [id number]  HPI The patient is here for follow up of their chronic medical problems, including CAD s/p PCI x 3  11/2019, htn, DM, dyslipidemia, CKD  He is taking all of his medications as prescribed.    He is exercising regularly.   He does the treadmill a few times a week and goes bowling.   His appetite is good.  He feels he is eating well, but may snack too much.   His bowels have been off a little for no reason.  He has had one loose stool and will take imodium.  He sometimes skips a day.  He is to go every day so this is a change for him.  He denies any changes in diet.   Medications and allergies reviewed with patient and updated if appropriate.  Patient Active Problem List   Diagnosis Date Noted  . Unstable angina ([place])   . Shortness of breath 11/01/2019  . Chest pressure 11/01/2019  . Chest tightness 01/07/2019  . Thrombocytopenia ([place])   . Skin cancer   . Pulmonary nodule   . Nocturia   . Myocardial infarction ([place])   . Low back pain syndrome   . COPD (chronic obstructive pulmonary disease) ([place])   . BPH (benign prostatic hyperplasia)   . Bladder cancer ([place])   . Arthritis   . CKD (chronic kidney disease), stage III 07/10/2017  . Diabetes ([place]) 04/15/2017  . Fatty liver 06/18/2016  . Rash and nonspecific skin eruption 06/13/2016  . H/O bacterial endocarditis 04/06/2016  . Myalgia and myositis 12/18/2015  . Abnormal CT of the abdomen 12/31/2013  . Abnormal chest x-ray 12/31/2013  . Gynecomastia   . Spinal stenosis, lumbar 05/12/2012  . CAD (coronary artery disease)   . Carotid artery disease ([place])   . CERVICAL RADICULOPATHY, LEFT 12/07/2009  . SKIN CANCER, HX OF 12/07/2009  . Renal cell carcinoma ([place]) 07/18/2009  . ANEMIA, 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  . GERD 07/18/2009  . Essential hypertension 04/23/2008  . Dyslipidemia 01/25/2007  . Cancer ([place]) 02/28/2001    Current  Outpatient Medications on File Prior to Visit  Medication Sig Dispense Refill  . acetaminophen (TYLENOL) 500 MG tablet Take 500 mg by mouth every 6 (six) hours as needed for moderate pain or headache.     . alfuzosin (UROXATRAL) 10 MG 24 hr tablet Take 10 mg by mouth daily.     Marland Kitchen aspirin EC 81 MG tablet Take 1 tablet (81 mg total) by mouth daily.    . carvedilol (COREG) 3.125 MG tablet Take 1 tablet (3.125 mg total) by mouth 2 (two) times daily. 180 tablet 3  . clopidogrel (PLAVIX) 75 MG tablet Take 1 tablet (75 mg total) by mouth daily. 90 tablet 3  . finasteride (PROSCAR) 5 MG tablet Take 5 mg by mouth daily.    Marland Kitchen losartan (COZAAR) 50 MG tablet Take 0.5 tablets (25 mg total) by mouth 2 (two) times daily. 90 tablet 3  . Multiple Vitamin (MULTIVITAMIN) tablet Take 1 tablet by mouth daily.      . nitroGLYCERIN (NITROSTAT) 0.4 MG SL tablet Place 1 tablet (0.4 mg total) under the tongue every 5 (five) minutes as needed for chest pain. 25 tablet 6  . rosuvastatin (CRESTOR) 5 MG tablet Take 1 tablet (5 mg total) by mouth at bedtime. 90 tablet 3  . sodium chloride (OCEAN) 0.65 % SOLN  nasal spray Place 1 spray into both nostrils as needed for congestion.    . triamcinolone ointment (KENALOG) 0.1 % Apply 1 application topically daily.     . Omega 3 1200 MG CAPS Take 2 capsules (2,400 mg total) by mouth 2 (two) times daily. (Patient not taking: Reported on 07/21/2020) 120 capsule 6   No current facility-administered medications on file prior to visit.    Past Medical History:  Diagnosis Date  . Allergy   . Anemia   . Arthritis    gout  . Bladder cancer ([place])   . BPH (benign prostatic hyperplasia)   . CAD (coronary artery disease) 2005   a. diaphragmatic MI s/p DES to RCA 2005, residual disease. b. [place] 11/2019 s/p DES to PDA, mRCA, mid Cx.  . Cancer ([place]) 02/28/2001   Left ureter cancer/bladder  . Carcinoma, renal cell ([place])   . Cardiomyopathy ([place])    a. EF 45-50% by echo 12/2018.  Marland Kitchen Carotid  artery disease ([place])    Doppler, September, 2010, normal, no carotid artery disease  . CKD (chronic kidney disease), stage III   . COPD (chronic obstructive pulmonary disease) ([place])   . Dysfunction of eustachian tube   . Ejection fraction    EF 55%, echo, January, 2009  . Encounter for long-term (current) use of other medications   . Enlarged prostate   . GERD (gastroesophageal reflux disease)   . Gynecomastia    b/l  . HTN (hypertension)   . Hx of cystoscopy 11/02/12   negative  . Hx of skin cancer, basal cell   . Hypercholesterolemia   . Hyperlipidemia   . Low back pain syndrome   . Myocardial infarction ([place])    hx acute  . Nocturia   . Organic impotence   . Pulmonary nodule    Chest CT, followed  . Reactive airway disease   . 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 Staph infection 04/1997  . Thrombocytopenia ([place])     Past Surgical History:  Procedure Laterality Date  . BACK SURGERY  02/1996  . bladder tumors  2003, 2004, 01/2009   removed  . CARDIAC CATHETERIZATION    . CHOLECYSTECTOMY  07/2003  . 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 CORONARY STENT INTERVENTION  11/28/2019  . CORONARY STENT INTERVENTION N/A 11/28/2019   Procedure: CORONARY STENT INTERVENTION;  Surgeon: Burnell Blanks, MD;  Location: [facility];  Service: Cardiovascular;  Laterality: N/A;  . LEFT HEART CATH AND CORONARY ANGIOGRAPHY N/A 11/27/2019   Procedure: LEFT HEART CATH AND CORONARY ANGIOGRAPHY;  Surgeon: Burnell Blanks, MD;  Location: [facility];  Service: Cardiovascular;  Laterality: N/A;  . NEPHRECTOMY  02/2001   left kidney, ureter  . osteomyelitis     staph aureus  . URETERECTOMY  2003   for cancer    Social History   Socioeconomic History  . Marital status: Widowed    Spouse name: Not on file  . Number of children: 1  . Years of education: Not on file  . Highest education level:  Not on file  Occupational History  . 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 Smoking status: Former Smoker    Types: Pipe    Quit date: 12/27/1991    Years since quitting: 28.5  . Smokeless tobacco: Former Systems developer  . Tobacco comment: Quit  20-30 years ago as of 2012  Vaping Use  . Vaping Use: Never used  Substance and Sexual Activity  . Alcohol use: No    Alcohol/week: 0.0 standard drinks  . Drug use: No  . Sexual activity: Not Currently  Other Topics Concern  . Not on file  Social History Narrative   30 minutes a day for 5 days a week   Social Determinants of Health   Financial Resource Strain:   . Difficulty of Paying Living Expenses:   Food Insecurity:   . Worried About Charity fundraiser in the Last Year:   . Arboriculturist in the Last Year:   Transportation Needs:   . Film/video editor (Medical):   Marland Kitchen Lack of Transportation (Non-Medical):   Physical Activity: Inactive  . Days of Exercise per Week: 0 days  . Minutes of Exercise per Session: 0 min  Stress:   . Feeling of Stress :   Social Connections: Unknown  . Frequency of Communication with Friends and Family: Not on file  . Frequency of Social Gatherings with Friends and Family: Not on file  . Attends Religious Services: 1 to 4 times per year  . Active Member of Clubs or Organizations: Not on file  . Attends Archivist Meetings: 1 to 4 times per year  . Marital Status: Not on file    Family History  Problem Relation Age of Onset  . Heart failure Mother 68  . Diabetes Mother   . Coronary artery disease Brother   . Diabetes Brother   . Drug abuse Brother   . Diabetes Sister   . Diabetes Sister     Review of Systems  Constitutional: Negative for chills and fever.  Respiratory: Positive for shortness of breath. Negative for cough and wheezing.   Cardiovascular: Negative for chest pain, palpitations and leg swelling.  Neurological: Negative for light-headedness and  headaches.       Objective:   Vitals:   07/21/20 1108  BP: 120/70  Pulse: 67  Temp: 97.9 F (36.6 C)  SpO2: 98%   BP Readings from Last 3 Encounters:  07/21/20 120/70  01/22/20 (!) 114/56  01/08/20 (!) 144/78   Wt Readings from Last 3 Encounters:  07/21/20 168 lb (76.2 kg)  01/22/20 170 lb 6.4 oz (77.3 kg)  01/08/20 171 lb (77.6 kg)   Body mass index is 26.31 kg/m.   Physical Exam    Constitutional: Appears well-developed and well-nourished. No distress.  HENT:  Head: Normocephalic and atraumatic.  Neck: Neck supple. No tracheal deviation present. No thyromegaly present.  No cervical lymphadenopathy Cardiovascular: Normal rate, regular rhythm and normal heart sounds.   No murmur heard. No carotid bruit .  No edema Pulmonary/Chest: Effort normal and breath sounds normal. No respiratory distress. No has no wheezes. No rales.  Skin: Skin is warm and dry. Not diaphoretic.  Psychiatric: Normal mood and affect. Behavior is normal.      Assessment & Plan:    See Problem List for Assessment and Plan of chronic medical problems.    This visit occurred during the SARS-CoV-2 public health emergency.  Safety protocols were in place, including screening questions prior to the visit, additional usage of staff PPE, and extensive cleaning of exam room while observing appropriate contact time as indicated for disinfecting solutions.

## 2020-07-20 NOTE — Patient Instructions (Addendum)
  Your a1c is 5.8% which is very good.     Medications reviewed and updated.  Changes include :   none     Please followup in 6 months

## 2020-07-21 ENCOUNTER — Ambulatory Visit (INDEPENDENT_AMBULATORY_CARE_PROVIDER_SITE_OTHER): Payer: Medicare Other | Admitting: Internal Medicine

## 2020-07-21 ENCOUNTER — Encounter: Payer: Self-pay | Admitting: Internal Medicine

## 2020-07-21 ENCOUNTER — Other Ambulatory Visit: Payer: Self-pay

## 2020-07-21 VITALS — BP 120/70 | HR 67 | Temp 97.9°F | Ht 67.0 in | Wt 168.0 lb

## 2020-07-21 DIAGNOSIS — E785 Hyperlipidemia, unspecified: Secondary | ICD-10-CM

## 2020-07-21 DIAGNOSIS — I2510 Atherosclerotic heart disease of native coronary artery without angina pectoris: Secondary | ICD-10-CM | POA: Diagnosis not present

## 2020-07-21 DIAGNOSIS — N1832 Chronic kidney disease, stage 3b: Secondary | ICD-10-CM | POA: Diagnosis not present

## 2020-07-21 DIAGNOSIS — E119 Type 2 diabetes mellitus without complications: Secondary | ICD-10-CM

## 2020-07-21 DIAGNOSIS — I10 Essential (primary) hypertension: Secondary | ICD-10-CM

## 2020-07-21 LAB — POCT GLYCOSYLATED HEMOGLOBIN (HGB A1C)
HbA1c POC (<> result, manual entry): 5.8 % (ref 4.0–5.6)
HbA1c, POC (controlled diabetic range): 5.8 % (ref 0.0–7.0)
HbA1c, POC (prediabetic range): 5.8 % (ref 5.7–6.4)
Hemoglobin A1C: 5.8 % — AB (ref 4.0–5.6)

## 2020-07-21 NOTE — Assessment & Plan Note (Signed)
Chronic Kidneys function has been stable

## 2020-07-21 NOTE — Assessment & Plan Note (Signed)
Chronic Diet controlled A1c today 5.8%-well-controlled

## 2020-07-21 NOTE — Assessment & Plan Note (Signed)
Chronic Status post PCI with stents Feels better since his procedure last December, but still has some shortness of breath-we will discuss with cardiology Continue current medications

## 2020-07-21 NOTE — Assessment & Plan Note (Signed)
Chronic Managed by cardiology Blood pressure well controlled Continue current medications

## 2020-07-21 NOTE — Assessment & Plan Note (Signed)
Chronic Managed by cardiology Lipids well controlled Continue Crestor

## 2020-07-27 ENCOUNTER — Encounter: Payer: Self-pay | Admitting: Cardiovascular Disease

## 2020-07-27 ENCOUNTER — Ambulatory Visit (INDEPENDENT_AMBULATORY_CARE_PROVIDER_SITE_OTHER): Payer: Medicare Other | Admitting: Cardiovascular Disease

## 2020-07-27 ENCOUNTER — Other Ambulatory Visit: Payer: Self-pay

## 2020-07-27 VITALS — BP 126/66 | HR 65 | Ht 67.0 in | Wt 171.2 lb

## 2020-07-27 DIAGNOSIS — I2510 Atherosclerotic heart disease of native coronary artery without angina pectoris: Secondary | ICD-10-CM | POA: Diagnosis not present

## 2020-07-27 DIAGNOSIS — I10 Essential (primary) hypertension: Secondary | ICD-10-CM

## 2020-07-27 NOTE — Patient Instructions (Signed)
Medication Instructions:  Your provider recommends that you continue on your current medications as directed. Please refer to the Current Medication list given to you today.   *If you need a refill on your cardiac medications before your next appointment, please call your pharmacy*  Lab Work: Your provider recommends that you return for FASTING lab work prior to your next appointment.  Follow-Up: At [facility], you and your health needs are our priority.  As part of our continuing mission to provide you with exceptional heart care, we have created designated Provider Care Teams.  These Care Teams include your primary Cardiologist (physician) and Advanced Practice Providers (APPs -  Physician Assistants and Nurse Practitioners) who all work together to provide you with the care you need, when you need it. Your next appointment:   6 month(s) The format for your next appointment:   In Person Provider:   You may see Mertie Moores, MD or one of the following Advanced Practice Providers on your designated Care Team:    Richardson Dopp, PA-C  [place], [place]

## 2020-07-27 NOTE — Progress Notes (Signed)
Cardiology Office Note   Date:  07/27/2020   ID:  Collin [name], DOB 1925-01-01, MRN [id number]  PCP:  Binnie Rail, MD  Cardiologist:  Mertie Moores, MD , previous patient of Dr. Ron Parker  Chief Complaint  Patient presents with  . Coronary Artery Disease  . Hyperlipidemia   Problem List 1. CAD- s/p stenting in 2005.  2. Essential HTN       Collin [name] is a 87 y.o. male who presents for follow-up of coronary disease.  Had a stent placed by Dr. Olevia Perches in 2005.  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   Goes bowling twice a week Took Atorvastatin for years but stopped due to leg pain .  Last lipids were drawn in Dec.  - showed markedly elevated Trig levels and high chol levels.   April 21, 2017:  Collin [name] has a hx of CAD Hyperlipidemia Was recently diagnosed with DM and He has greatly improved his diet. He's cut back on lots of carbohydrates. He's lost about 5 pounds. He's noticed that his blood pressure has been low. This morning he took only half of his valsartan tablet.  Takes SBE prophylasix - has done so for years.  No prosthetic valve and no artificial joints  Jan. 8, 2019:  Collin [name] is seen back for follow-up visit for his coronary artery disease and diabetes mellitus.   He walks on the treadmill 4-5 times a week for 30 minutes at a time.  He denies any angina while walking the treadmill.  He still bowls once or twice a week.  He notes that his blood pressure has been somewhat variable.  It seems to go up in the evenings.  Eats prepared meals every meal.      July 24, 2018:    Collin [name] is seen today for follow up of his CAD Has pain in his calves with walking -  Resolves with walking  Had a [facility] screening of PAD screening  No CP  Still works out on the treadmill, bowl one a week   Recent lab work shows that his triglyceride level is markedly  elevated at 613.  Total cholesterol is 179.  His LDL is 77. Eats lots of cereal, through the day .     January 21, 2019:  Strained his chest recently while lifting a large box Has had some shortness of breath  Saw his primary   Echo shows a mildly depressed left ventricular systolic function with an ejection fraction of 45 to 50%.  There is hypokinesis of the inferior wall.  He has grade 1 diastolic dysfunction.  He has mild mitral regurgitation. The pain was clearly MSK.    Feeling better.   Breathing seems to be better .   November 20, 2019:   Collin [name] is seen today for a follow-up visit. He was recently seen by [name], PA with symptoms consistent with unstable angina.  He wanted to discuss it further and is seeing me today for further evaluation.  Pain / heaviness  - center of chest  Worse with walking or carrying groceries from car Had a stent in 2005.  This current pain is similar to his angina .  No diaphoresis,  No syncope.  No presyncope  Last for several minutes.  Or until he stops to rest  Was raking leaves  for 20 min and had angina   January 22, 2020:  Collin [name] seen back for a follow-up visit.  He was complaining of angina when I saw him back in November, 2020. He was found to have significant disease in his right coronary artery and circumflex artery.  He had a stent placed in his mid right coronary artery as well as the posterior descending artery.  He also had a stent placed in the mid circumflex artery. The left anterior descending artery has a moderate to severe stenosis that has been treated medically. He is greatly improved.  The chest pressure has resolved.  Has not had to take any NTG. Still has some dyspnea.  Walks on the treadmill without any issues.   Was not able to do cardiac rehab due to covid.   He saw Richardson Dopp, PA on January 4.  His renal function was fairly stable.  Creatinine was 1.72.  Potassium is 4.5.  Total cholesterol is 118.  HDL is 42.   Triglyceride levels 223.  The LDL is 41.   July 27, 2020:  Collin [name] is seen today for follow-up of his coronary artery disease.  He has had a stent placed in his right coronary artery as well as the posterior descending artery.  He also has a stent in his left circumflex artery. Looks great for 89.   Has been  out bowling this am.   Is very active  Breathing is good Would like to try a glass of wine at night - I gave the OK   Past Medical History:  Diagnosis Date  . Allergy   . Anemia   . Arthritis    gout  . Bladder cancer ([place])   . BPH (benign prostatic hyperplasia)   . CAD (coronary artery disease) 2005   a. diaphragmatic MI s/p DES to RCA 2005, residual disease. b. [place] 11/2019 s/p DES to PDA, mRCA, mid Cx.  . Cancer ([place]) 02/28/2001   Left ureter cancer/bladder  . Carcinoma, renal cell ([place])   . Cardiomyopathy ([place])    a. EF 45-50% by echo 12/2018.  Marland Kitchen Carotid artery disease ([place])    Doppler, September, 2010, normal, no carotid artery disease  . CKD (chronic kidney disease), stage III   . COPD (chronic obstructive pulmonary disease) ([place])   . Dysfunction of eustachian tube   . Ejection fraction    EF 55%, echo, January, 2009  . Encounter for long-term (current) use of other medications   . Enlarged prostate   . GERD (gastroesophageal reflux disease)   . Gynecomastia    b/l  . HTN (hypertension)   . Hx of cystoscopy 11/02/12   negative  . Hx of skin cancer, basal cell   . Hypercholesterolemia   . Hyperlipidemia   . Low back pain syndrome   . Myocardial infarction ([place])    hx acute  . Nocturia   . Organic impotence   . Pulmonary nodule    Chest CT, followed  . Reactive airway disease   . 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 Staph infection 04/1997  . Thrombocytopenia ([place])     Past Surgical History:  Procedure Laterality Date  . BACK SURGERY  02/1996  . bladder tumors  2003, 2004, 01/2009   removed  . CARDIAC CATHETERIZATION    .  CHOLECYSTECTOMY  07/2003  . 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  CORONARY STENT INTERVENTION  11/28/2019  . CORONARY STENT INTERVENTION N/A 11/28/2019   Procedure: CORONARY STENT INTERVENTION;  Surgeon: Burnell Blanks, MD;  Location: [facility];  Service: Cardiovascular;  Laterality: N/A;  . LEFT HEART CATH AND CORONARY ANGIOGRAPHY N/A 11/27/2019   Procedure: LEFT HEART CATH AND CORONARY ANGIOGRAPHY;  Surgeon: Burnell Blanks, MD;  Location: [facility];  Service: Cardiovascular;  Laterality: N/A;  . NEPHRECTOMY  02/2001   left kidney, ureter  . osteomyelitis     staph aureus  . URETERECTOMY  2003   for cancer    Patient Active Problem List   Diagnosis Date Noted  . CAD (coronary artery disease)     Priority: High  . Essential hypertension 04/23/2008    Priority: High  . Dyslipidemia 01/25/2007    Priority: High  . Unstable angina ([place])   . Shortness of breath 11/01/2019  . Chest pressure 11/01/2019  . Chest tightness 01/07/2019  . Thrombocytopenia ([place])   . Skin cancer   . Pulmonary nodule   . Nocturia   . Myocardial infarction ([place])   . Low back pain syndrome   . COPD (chronic obstructive pulmonary disease) ([place])   . BPH (benign prostatic hyperplasia)   . Bladder cancer ([place])   . Arthritis   . CKD (chronic kidney disease), stage III 07/10/2017  . Diabetes ([place]) 04/15/2017  . Fatty liver 06/18/2016  . Rash and nonspecific skin eruption 06/13/2016  . H/O bacterial endocarditis 04/06/2016  . Myalgia and myositis 12/18/2015  . Abnormal CT of the abdomen 12/31/2013  . Abnormal chest x-ray 12/31/2013  . Gynecomastia   . Spinal stenosis, lumbar 05/12/2012  . Carotid artery disease ([place])   . CERVICAL RADICULOPATHY, LEFT 12/07/2009  . SKIN CANCER, HX OF 12/07/2009  . Renal cell carcinoma ([place]) 07/18/2009  . ANEMIA, 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  . GERD 07/18/2009  . Cancer ([place]) 02/28/2001       Current Outpatient Medications  Medication Sig Dispense Refill  . acetaminophen (TYLENOL) 500 MG tablet Take 500 mg by mouth every 6 (six) hours as needed for moderate pain or headache.     . alfuzosin (UROXATRAL) 10 MG 24 hr tablet Take 10 mg by mouth daily.     Marland Kitchen aspirin EC 81 MG tablet Take 1 tablet (81 mg total) by mouth daily.    . carvedilol (COREG) 3.125 MG tablet Take 1 tablet (3.125 mg total) by mouth 2 (two) times daily. 180 tablet 3  . clopidogrel (PLAVIX) 75 MG tablet Take 1 tablet (75 mg total) by mouth daily. 90 tablet 3  . finasteride (PROSCAR) 5 MG tablet Take 5 mg by mouth daily.    Marland Kitchen losartan (COZAAR) 50 MG tablet Take 0.5 tablets (25 mg total) by mouth 2 (two) times daily. 90 tablet 3  . Multiple Vitamin (MULTIVITAMIN) tablet Take 1 tablet by mouth daily.      . nitroGLYCERIN (NITROSTAT) 0.4 MG SL tablet Place 1 tablet (0.4 mg total) under the tongue every 5 (five) minutes as needed for chest pain. 25 tablet 6  . rosuvastatin (CRESTOR) 5 MG tablet Take 1 tablet (5 mg total) by mouth at bedtime. 90 tablet 3  . sodium chloride (OCEAN) 0.65 % SOLN nasal spray Place 1 spray into both nostrils as needed for congestion.    . triamcinolone ointment (KENALOG) 0.1 % Apply 1 application topically daily.      No current facility-administered medications for this visit.    Allergies:   Atorvastatin,  Peanut oil, and Trandolapril    Social History:  The patient  reports that he quit smoking about 28 years ago. His smoking use included pipe. He has quit using smokeless tobacco. He reports that he does not drink alcohol and does not use drugs.   Family History:  The patient's family history includes Coronary artery disease in his brother; Diabetes in his brother, mother, sister, and sister; Drug abuse in his brother; Heart failure (age of onset: 89) in his mother.    ROS:  Please see the history of present illness.  Patient denies fever, chills, headache, sweats, rash, change in  vision, change in hearing, chest pain, cough, nausea or vomiting, urinary symptoms. All other systems are reviewed and are negative.     Physical Exam: Blood pressure 126/66, pulse 65, height 5\' 7"  (1.702 m), weight 171 lb 3.2 oz (77.7 kg), SpO2 95 %.  GEN:  Elderly male,  NAD  HEENT: Normal NECK: No JVD; No carotid bruits LYMPHATICS: No lymphadenopathy CARDIAC: RRR ,  RESPIRATORY:  Clear to auscultation without rales, wheezing or rhonchi  ABDOMEN: Soft, non-tender, non-distended MUSCULOSKELETAL:  No edema; No deformity  SKIN: Warm and dry NEUROLOGIC:  Alert and oriented x 3  EKG:     Recent Labs: 11/01/2019: Pro B Natriuretic peptide (BNP) 31.0 07/17/2020: ALT 17; BUN 20; Creatinine, Ser 1.47; Hemoglobin 13.4; Platelets 116; Potassium 4.5; Sodium 141    Lipid Panel    Component Value Date/Time   CHOL 149 07/17/2020 0839   TRIG 206 (H) 07/17/2020 0839   HDL 35 (L) 07/17/2020 0839   CHOLHDL 4.3 07/17/2020 0839   CHOLHDL 4 07/08/2019 1421   VLDL 42.0 (H) 07/08/2019 1421   LDLCALC 79 07/17/2020 0839   LDLDIRECT 107.0 07/08/2019 1421      Wt Readings from Last 3 Encounters:  07/27/20 171 lb 3.2 oz (77.7 kg)  07/21/20 168 lb (76.2 kg)  01/22/20 170 lb 6.4 oz (77.3 kg)      Current medicines are reviewed. The patient has a very good understanding of his medications. No changes are to be made.       ASSESSMENT AND PLAN:  1. CAD-he is doing very well.  Is not having any episodes of chest pain or shortness of breath.  Continue current medications. He noted that his platelet count is a little bit low.  We discussed the fact that the aspirin Plavix decreased the stickiness of the platelets but did not affect the platelet count.  He will continue to follow this with his primary medical doctor.  2. Essential HTN-blood pressure is well controlled.  3. Hyperlipidemia -    lipid levels look good.  I will check them again when I see him again in 6 months.  Follow-up with me in  6 months for office visit, lipids, liver enzymes, basic metabolic profile in 6 months.   Mertie Moores, MD  07/27/2020 2:23 PM    [address],  [place], [place]  [address] Pager [phone] Phone: [phone]; Fax: [phone]

## 2020-08-26 ENCOUNTER — Encounter: Payer: Self-pay | Admitting: Internal Medicine

## 2020-09-25 ENCOUNTER — Ambulatory Visit (INDEPENDENT_AMBULATORY_CARE_PROVIDER_SITE_OTHER): Payer: Medicare Other | Admitting: Podiatry

## 2020-09-25 ENCOUNTER — Encounter: Payer: Self-pay | Admitting: Podiatry

## 2020-09-25 ENCOUNTER — Other Ambulatory Visit: Payer: Self-pay

## 2020-09-25 DIAGNOSIS — E119 Type 2 diabetes mellitus without complications: Secondary | ICD-10-CM

## 2020-09-25 DIAGNOSIS — B351 Tinea unguium: Secondary | ICD-10-CM | POA: Diagnosis not present

## 2020-09-25 DIAGNOSIS — D689 Coagulation defect, unspecified: Secondary | ICD-10-CM | POA: Diagnosis not present

## 2020-09-25 DIAGNOSIS — M79675 Pain in left toe(s): Secondary | ICD-10-CM

## 2020-09-25 DIAGNOSIS — N1832 Chronic kidney disease, stage 3b: Secondary | ICD-10-CM | POA: Diagnosis not present

## 2020-09-25 DIAGNOSIS — M79674 Pain in right toe(s): Secondary | ICD-10-CM

## 2020-09-25 NOTE — Progress Notes (Signed)
This patient returns to my office for at risk foot care.  This patient requires this care by a professional since this patient will be at risk due to having thrombocytopenia, CKD, and diabetes.  This patient is unable to cut nails himself since the patient cannot reach his nails.These nails are painful walking and wearing shoes.  This patient presents for at risk foot care today. . Patient is taking plavix.  General Appearance  Alert, conversant and in no acute stress.  Vascular  Dorsalis pedis and posterior tibial  pulses are palpable  bilaterally.  Capillary return is within normal limits  bilaterally. Temperature is within normal limits  bilaterally.  Neurologic  Senn-Weinstein monofilament wire test within normal limits  bilaterally. Muscle power within normal limits bilaterally.  Nails Thick disfigured discolored nails with subungual debris  from hallux to fifth toes bilaterally. No evidence of bacterial infection or drainage bilaterally.  Orthopedic  No limitations of motion  feet .  No crepitus or effusions noted.  No bony pathology or digital deformities noted.  HAV  B/L.  Skin  normotropic skin with no porokeratosis noted bilaterally.  No signs of infections or ulcers noted.     Onychomycosis  Pain in right toes  Pain in left toes  Consent was obtained for treatment procedures.   Mechanical debridement of nails 1-5  bilaterally performed with a nail nipper.  Filed with dremel without incident.    Return office visit    3 months                  Told patient to return for periodic foot care and evaluation due to potential at risk complications.   Raeana Blinn DPM  

## 2020-10-15 ENCOUNTER — Ambulatory Visit (INDEPENDENT_AMBULATORY_CARE_PROVIDER_SITE_OTHER): Payer: Medicare Other

## 2020-10-15 ENCOUNTER — Other Ambulatory Visit: Payer: Self-pay

## 2020-10-15 VITALS — BP 122/80 | HR 60 | Temp 98.2°F | Resp 16 | Ht 67.0 in | Wt 169.0 lb

## 2020-10-15 DIAGNOSIS — Z0000 Encounter for general adult medical examination without abnormal findings: Secondary | ICD-10-CM

## 2020-10-15 NOTE — Patient Instructions (Signed)
[name] , Thank you for taking time to come for your Medicare Wellness Visit. I appreciate your ongoing commitment to your health goals. Please review the following plan we discussed and let me know if I can assist you in the future.   Screening recommendations/referrals: Colonoscopy: no repeat due to age Recommended yearly ophthalmology/optometry visit for glaucoma screening and checkup Recommended yearly dental visit for hygiene and checkup  Vaccinations: Influenza vaccine: 08/2020 ([facility]) Pneumococcal vaccine: up to date Tdap vaccine: up to date Shingles vaccine: up to date   Covid-19: up to date  Advanced directives: Please bring a copy of your health care power of attorney and living will to the office at your convenience.  Conditions/risks identified: Yes. Reviewed health maintenance screenings with patient today and relevant education, vaccines, and/or referrals were provided. Continue doing brain stimulating activities (puzzles, reading, adult coloring books, staying active) to keep memory sharp. Continue to eat heart healthy diet (full of fruits, vegetables, whole grains, lean protein, water--limit salt, fat, and sugar intake) and increase physical activity as tolerated.  Next appointment: Please schedule your next Medicare Wellness Visit with your Nurse Health Advisor in 1 year by calling [phone].  Preventive Care 84 Years and Older, Male Preventive care refers to lifestyle choices and visits with your health care provider that can promote health and wellness. What does preventive care include?  A yearly physical exam. This is also called an annual well check.  Dental exams once or twice a year.  Routine eye exams. Ask your health care provider how often you should have your eyes checked.  Personal lifestyle choices, including:  Daily care of your teeth and gums.  Regular physical activity.  Eating a healthy diet.  Avoiding tobacco and drug use.  Limiting  alcohol use.  Practicing safe sex.  Taking low doses of aspirin every day.  Taking vitamin and mineral supplements as recommended by your health care provider. What happens during an annual well check? The services and screenings done by your health care provider during your annual well check will depend on your age, overall health, lifestyle risk factors, and family history of disease. Counseling  Your health care provider may ask you questions about your:  Alcohol use.  Tobacco use.  Drug use.  Emotional well-being.  Home and relationship well-being.  Sexual activity.  Eating habits.  History of falls.  Memory and ability to understand (cognition).  Work and work Statistician. Screening  You may have the following tests or measurements:  Height, weight, and BMI.  Blood pressure.  Lipid and cholesterol levels. These may be checked every 5 years, or more frequently if you are over 84 years old.  Skin check.  Lung cancer screening. You may have this screening every year starting at age 84 if you have a 30-pack-year history of smoking and currently smoke or have quit within the past 15 years.  Fecal occult blood test (FOBT) of the stool. You may have this test every year starting at age 84.  Flexible sigmoidoscopy or colonoscopy. You may have a sigmoidoscopy every 5 years or a colonoscopy every 10 years starting at age 84.  Prostate cancer screening. Recommendations will vary depending on your family history and other risks.  Hepatitis C blood test.  Hepatitis B blood test.  Sexually transmitted disease (STD) testing.  Diabetes screening. This is done by checking your blood sugar (glucose) after you have not eaten for a while (fasting). You may have this done every 1-3 years.  Abdominal  aortic aneurysm (AAA) screening. You may need this if you are a current or former smoker.  Osteoporosis. You may be screened starting at age 84 if you are at high risk. Talk  with your health care provider about your test results, treatment options, and if necessary, the need for more tests. Vaccines  Your health care provider may recommend certain vaccines, such as:  Influenza vaccine. This is recommended every year.  Tetanus, diphtheria, and acellular pertussis (Tdap, Td) vaccine. You may need a Td booster every 10 years.  Zoster vaccine. You may need this after age 66.  Pneumococcal 13-valent conjugate (PCV13) vaccine. One dose is recommended after age 13.  Pneumococcal polysaccharide (PPSV23) vaccine. One dose is recommended after age 65. Talk to your health care provider about which screenings and vaccines you need and how often you need them. This information is not intended to replace advice given to you by your health care provider. Make sure you discuss any questions you have with your health care provider. Document Released: 01/08/2016 Document Revised: 08/31/2016 Document Reviewed: 10/13/2015 Elsevier Interactive Patient Education  2017 [place] Prevention in the Home Falls can cause injuries. They can happen to people of all ages. There are many things you can do to make your home safe and to help prevent falls. What can I do on the outside of my home?  Regularly fix the edges of walkways and driveways and fix any cracks.  Remove anything that might make you trip as you walk through a door, such as a raised step or threshold.  Trim any bushes or trees on the path to your home.  Use bright outdoor lighting.  Clear any walking paths of anything that might make someone trip, such as rocks or tools.  Regularly check to see if handrails are loose or broken. Make sure that both sides of any steps have handrails.  Any raised decks and porches should have guardrails on the edges.  Have any leaves, snow, or ice cleared regularly.  Use sand or salt on walking paths during winter.  Clean up any spills in your garage right away. This  includes oil or grease spills. What can I do in the bathroom?  Use night lights.  Install grab bars by the toilet and in the tub and shower. Do not use towel bars as grab bars.  Use non-skid mats or decals in the tub or shower.  If you need to sit down in the shower, use a plastic, non-slip stool.  Keep the floor dry. Clean up any water that spills on the floor as soon as it happens.  Remove soap buildup in the tub or shower regularly.  Attach bath mats securely with double-sided non-slip rug tape.  Do not have throw rugs and other things on the floor that can make you trip. What can I do in the bedroom?  Use night lights.  Make sure that you have a light by your bed that is easy to reach.  Do not use any sheets or blankets that are too big for your bed. They should not hang down onto the floor.  Have a firm chair that has side arms. You can use this for support while you get dressed.  Do not have throw rugs and other things on the floor that can make you trip. What can I do in the kitchen?  Clean up any spills right away.  Avoid walking on wet floors.  Keep items that you use a  lot in easy-to-reach places.  If you need to reach something above you, use a strong step stool that has a grab bar.  Keep electrical cords out of the way.  Do not use floor polish or wax that makes floors slippery. If you must use wax, use non-skid floor wax.  Do not have throw rugs and other things on the floor that can make you trip. What can I do with my stairs?  Do not leave any items on the stairs.  Make sure that there are handrails on both sides of the stairs and use them. Fix handrails that are broken or loose. Make sure that handrails are as long as the stairways.  Check any carpeting to make sure that it is firmly attached to the stairs. Fix any carpet that is loose or worn.  Avoid having throw rugs at the top or bottom of the stairs. If you do have throw rugs, attach them to the  floor with carpet tape.  Make sure that you have a light switch at the top of the stairs and the bottom of the stairs. If you do not have them, ask someone to add them for you. What else can I do to help prevent falls?  Wear shoes that:  Do not have high heels.  Have rubber bottoms.  Are comfortable and fit you well.  Are closed at the toe. Do not wear sandals.  If you use a stepladder:  Make sure that it is fully opened. Do not climb a closed stepladder.  Make sure that both sides of the stepladder are locked into place.  Ask someone to hold it for you, if possible.  Clearly mark and make sure that you can see:  Any grab bars or handrails.  First and last steps.  Where the edge of each step is.  Use tools that help you move around (mobility aids) if they are needed. These include:  Canes.  Walkers.  Scooters.  Crutches.  Turn on the lights when you go into a dark area. Replace any light bulbs as soon as they burn out.  Set up your furniture so you have a clear path. Avoid moving your furniture around.  If any of your floors are uneven, fix them.  If there are any pets around you, be aware of where they are.  Review your medicines with your doctor. Some medicines can make you feel dizzy. This can increase your chance of falling. Ask your doctor what other things that you can do to help prevent falls. This information is not intended to replace advice given to you by your health care provider. Make sure you discuss any questions you have with your health care provider. Document Released: 10/08/2009 Document Revised: 05/19/2016 Document Reviewed: 01/16/2015 Elsevier Interactive Patient Education  2017 [employer].

## 2020-10-15 NOTE — Progress Notes (Signed)
Subjective:   Collin [name] is a 86 y.o. male who presents for Medicare Annual/Subsequent preventive examination.  Review of Systems    No ROS. Medicare Wellness Visit Cardiac Risk Factors include: advanced age (>52men, >66 women);dyslipidemia;family history of premature cardiovascular disease;hypertension;male gender     Objective:    Today's Vitals   10/15/20 1335  BP: 122/80  Pulse: 60  Resp: 16  Temp: 98.2 F (36.8 C)  SpO2: 96%  Weight: 169 lb (76.7 kg)  Height: 5\' 7"  (1.702 m)  PainSc: 0-No pain   Body mass index is 26.47 kg/m.  Advanced Directives 10/15/2020 11/27/2019 10/14/2019 10/08/2018 10/04/2017 08/06/2016 04/06/2016  Does Patient Have a Medical Advance Directive? Yes Yes Yes Yes Yes No Yes  Type of Paramedic of [place];Living will Living will;Healthcare Power of [place];Living will [place];Living will [place];Living will - -  Does patient want to make changes to medical advance directive? No - Patient declined No - Patient declined - - - - -  Copy of [place] in Chart? No - copy requested Yes - validated most recent copy scanned in chart (See row information) No - copy requested No - copy requested No - copy requested - -  Would patient like information on creating a medical advance directive? - - - - - No - patient declined information -    Current Medications (verified) Outpatient Encounter Medications as of 10/15/2020  Medication Sig  . acetaminophen (TYLENOL) 500 MG tablet Take 500 mg by mouth every 6 (six) hours as needed for moderate pain or headache.   . alfuzosin (UROXATRAL) 10 MG 24 hr tablet Take 10 mg by mouth daily.   Marland Kitchen aspirin EC 81 MG tablet Take 1 tablet (81 mg total) by mouth daily.  . carvedilol (COREG) 3.125 MG tablet Take 1 tablet (3.125 mg total) by mouth 2 (two) times daily.  . clopidogrel (PLAVIX) 75 MG tablet Take 1  tablet (75 mg total) by mouth daily.  . finasteride (PROSCAR) 5 MG tablet Take 5 mg by mouth daily.  Marland Kitchen losartan (COZAAR) 50 MG tablet Take 0.5 tablets (25 mg total) by mouth 2 (two) times daily.  . metroNIDAZOLE (METROGEL) 0.75 % gel Apply topically 2 (two) times daily.  . Multiple Vitamin (MULTIVITAMIN) tablet Take 1 tablet by mouth daily.    . nitroGLYCERIN (NITROSTAT) 0.4 MG SL tablet Place 1 tablet (0.4 mg total) under the tongue every 5 (five) minutes as needed for chest pain.  . rosuvastatin (CRESTOR) 5 MG tablet Take 1 tablet (5 mg total) by mouth at bedtime.  . sodium chloride (OCEAN) 0.65 % SOLN nasal spray Place 1 spray into both nostrils as needed for congestion.  . triamcinolone ointment (KENALOG) 0.1 % Apply 1 application topically daily.    No facility-administered encounter medications on file as of 10/15/2020.    Allergies (verified) Atorvastatin, Peanut oil, and Trandolapril   History: Past Medical History:  Diagnosis Date  . Allergy   . Anemia   . Arthritis    gout  . Bladder cancer ([place])   . BPH (benign prostatic hyperplasia)   . CAD (coronary artery disease) 2005   a. diaphragmatic MI s/p DES to RCA 2005, residual disease. b. [place] 11/2019 s/p DES to PDA, mRCA, mid Cx.  . Cancer ([place]) 02/28/2001   Left ureter cancer/bladder  . Carcinoma, renal cell ([place])   . Cardiomyopathy ([place])    a. EF  45-50% by echo 12/2018.  Marland Kitchen Carotid artery disease ([place])    Doppler, September, 2010, normal, no carotid artery disease  . CKD (chronic kidney disease), stage III ([place])   . COPD (chronic obstructive pulmonary disease) ([place])   . Dysfunction of eustachian tube   . Ejection fraction    EF 55%, echo, January, 2009  . Encounter for long-term (current) use of other medications   . Enlarged prostate   . GERD (gastroesophageal reflux disease)   . Gynecomastia    b/l  . HTN (hypertension)   . Hx of cystoscopy 11/02/12   negative  . Hx of skin cancer, basal cell   .  Hypercholesterolemia   . Hyperlipidemia   . Low back pain syndrome   . Myocardial infarction ([facility])    hx acute  . Nocturia   . Organic impotence   . Pulmonary nodule    Chest CT, followed  . Reactive airway disease   . 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 Staph infection 04/1997  . Thrombocytopenia ([place])    Past Surgical History:  Procedure Laterality Date  . BACK SURGERY  02/1996  . bladder tumors  2003, 2004, 01/2009   removed  . CARDIAC CATHETERIZATION    . CHOLECYSTECTOMY  07/2003  . 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 CORONARY STENT INTERVENTION  11/28/2019  . CORONARY STENT INTERVENTION N/A 11/28/2019   Procedure: CORONARY STENT INTERVENTION;  Surgeon: Burnell Blanks, MD;  Location: [facility];  Service: Cardiovascular;  Laterality: N/A;  . LEFT HEART CATH AND CORONARY ANGIOGRAPHY N/A 11/27/2019   Procedure: LEFT HEART CATH AND CORONARY ANGIOGRAPHY;  Surgeon: Burnell Blanks, MD;  Location: [facility];  Service: Cardiovascular;  Laterality: N/A;  . NEPHRECTOMY  02/2001   left kidney, ureter  . osteomyelitis     staph aureus  . URETERECTOMY  2003   for cancer   Family History  Problem Relation Age of Onset  . Heart failure Mother 14  . Diabetes Mother   . Coronary artery disease Brother   . Diabetes Brother   . Drug abuse Brother   . Diabetes Sister   . Diabetes Sister    Social History   Socioeconomic History  . Marital status: Widowed    Spouse name: Not on file  . Number of children: 1  . Years of education: Not on file  . Highest education level: Not on file  Occupational History  . 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 Smoking status: Former Smoker    Types: Pipe    Quit date: 12/27/1991    Years since quitting: 28.8  . Smokeless tobacco: Former Systems developer  . Tobacco comment: Quit 20-30 years ago as of 2012  Vaping Use  .  Vaping Use: Never used  Substance and Sexual Activity  . Alcohol use: No    Alcohol/week: 0.0 standard drinks  . Drug use: No  . Sexual activity: Not Currently  Other Topics Concern  . Not on file  Social History Narrative   30 minutes a day for 5 days a week   Social Determinants of Health   Financial Resource Strain: Low Risk   . Difficulty of Paying Living Expenses: Not hard at all  Food Insecurity: No Food Insecurity  . Worried About Charity fundraiser in the Last Year: Never true  .  Ran Out of Food in the Last Year: Never true  Transportation Needs: No Transportation Needs  . Lack of Transportation (Medical): No  . Lack of Transportation (Non-Medical): No  Physical Activity: Sufficiently Active  . Days of Exercise per Week: 5 days  . Minutes of Exercise per Session: 30 min  Stress: No Stress Concern Present  . Feeling of Stress : Not at all  Social Connections: Moderately Integrated  . Frequency of Communication with Friends and Family: Twice a week  . Frequency of Social Gatherings with Friends and Family: Once a week  . Attends Religious Services: More than 4 times per year  . Active Member of Clubs or Organizations: Yes  . Attends Archivist Meetings: More than 4 times per year  . Marital Status: Widowed    Tobacco Counseling Counseling given: Not Answered Comment: Quit 20-30 years ago as of 2012   Clinical Intake:  Pre-visit preparation completed: Yes  Pain : No/denies pain Pain Score: 0-No pain     BMI - recorded: 26.47 Nutritional Status: BMI 25 -29 Overweight Nutritional Risks: None Diabetes: No  How often do you need to have someone help you when you read instructions, pamphlets, or other written materials from your doctor or pharmacy?: 1 - Never What is the last grade level you completed in school?: Some college  Diabetic? no  Interpreter Needed?: No  Information entered by :: Lisette Abu, LPN   Activities of Daily  Living In your present state of health, do you have any difficulty performing the following activities: 10/15/2020 11/28/2019  Hearing? N N  Vision? N N  Difficulty concentrating or making decisions? N N  Walking or climbing stairs? N Y  Dressing or bathing? N N  Doing errands, shopping? N -  Preparing Food and eating ? N -  Using the Toilet? N -  In the past six months, have you accidently leaked urine? N -  Do you have problems with loss of bowel control? N -  Managing your Medications? N -  Managing your Finances? N -  Housekeeping or managing your Housekeeping? N -  Some recent data might be hidden    Patient Care Team: Binnie Rail, MD as PCP - General (Internal Medicine) Nahser, Wonda Cheng, MD as PCP - Cardiology (Cardiology)  Indicate any recent Medical Services you may have received from other than [facility] providers in the past year (date may be approximate).     Assessment:   This is a routine wellness examination for [place].  Hearing/Vision screen No exam data present  Dietary issues and exercise activities discussed: Current Exercise Habits: Home exercise routine, Type of exercise: Other - see comments;treadmill (bowling 2 x week), Time (Minutes): 30, Frequency (Times/Week): 5, Weekly Exercise (Minutes/Week): 150, Intensity: Moderate, Exercise limited by: None identified  Goals    .  lose weight      Monitor diet for carbohydrates, increase protein, continue to walk on  treadmill, worship God, and enjoy life.    .  patient (pt-stated)      Find healthy snacks;        Depression Screen PHQ 2/9 Scores 10/15/2020 10/14/2019 07/08/2019 01/07/2019 10/08/2018 10/04/2017 04/06/2016  PHQ - 2 Score 0 1 0 0 0 1 0  PHQ- 9 Score - 4 - - - 1 -    Fall Risk Fall Risk  10/15/2020 10/14/2019 07/08/2019 01/07/2019 10/08/2018  Falls in the past year? 0 0 0 0 No  Comment - - - - -  Number falls in past yr: 0 0 0 0 -  Injury with Fall? 0 0 - - -  Risk for fall due to : No Fall  Risks - - - -  Follow up Falls evaluation completed - - - -    Any stairs in or around the home? No  If so, are there any without handrails? No  Home free of loose throw rugs in walkways, pet beds, electrical cords, etc? Yes  Adequate lighting in your home to reduce risk of falls? Yes   ASSISTIVE DEVICES UTILIZED TO PREVENT FALLS:  Life alert? No  Use of a cane, walker or w/c? No  Grab bars in the bathroom? Yes  Shower chair or bench in shower? No  Elevated toilet seat or a handicapped toilet? No   TIMED UP AND GO:  Was the test performed? No .  Length of time to ambulate 10 feet: 0 sec.   Gait steady and fast without use of assistive device  Cognitive Function: MMSE - Mini Mental State Exam 10/08/2018 10/04/2017 04/06/2016  Not completed: - - (No Data)  Orientation to time 5 5 -  Orientation to Place 5 5 -  Registration 3 3 -  Attention/ Calculation 5 5 -  Recall 3 3 -  Language- name 2 objects 2 2 -  Language- repeat 1 1 -  Language- follow 3 step command 3 3 -  Language- read & follow direction 1 1 -  Write a sentence 1 1 -  Copy design 1 1 -  Total score 30 30 -     6CIT Screen 10/14/2019  What Year? 0 points  What month? 0 points  What time? 0 points  Count back from 20 0 points  Months in reverse 0 points  Repeat phrase 0 points  Total Score 0    Immunizations Immunization History  Administered Date(s) Administered  . Influenza Whole 09/17/2008, 07/26/2012, 09/10/2013  . Influenza, High Dose Seasonal PF 08/29/2014, 10/07/2016, 08/04/2018, 08/15/2019  . Influenza-Unspecified 08/31/2015, 09/21/2017  . Pneumococcal Conjugate-13 11/07/2016  . Pneumococcal-Unspecified 12/26/1996  . Tdap 07/17/2017  . Zoster Recombinat (Shingrix) 10/05/2017, 12/11/2017    TDAP status: Up to date Flu Vaccine status: Up to date Pneumococcal vaccine status: Up to date Covid-19 vaccine status: Completed vaccines  Qualifies for Shingles Vaccine? Yes   Zostavax completed  Yes   Shingrix Completed?: Yes  Screening Tests Health Maintenance  Topic Date Due  . COVID-19 Vaccine (1) Never done  . INFLUENZA VACCINE  07/26/2020  . OPHTHALMOLOGY EXAM  10/09/2020  . HEMOGLOBIN A1C  01/21/2021  . FOOT EXAM  09/25/2021  . TETANUS/TDAP  07/18/2027  . PNA vac Low Risk Adult  Completed    Health Maintenance  Health Maintenance Due  Topic Date Due  . COVID-19 Vaccine (1) Never done  . INFLUENZA VACCINE  07/26/2020  . OPHTHALMOLOGY EXAM  10/09/2020    Colorectal cancer screening: No longer required.   Lung Cancer Screening: (Low Dose CT Chest recommended if Age 67-80 years, 30 pack-year currently smoking OR have quit w/in 15years.) does not qualify.   Lung Cancer Screening Referral: no  Additional Screening:  Hepatitis C Screening: does not qualify; Completed no  Vision Screening: Recommended annual ophthalmology exams for early detection of glaucoma and other disorders of the eye. Is the patient up to date with their annual eye exam?  Yes  Who is the provider or what is the name of the office in which the patient attends annual eye  exams? Dr. Marilynne Halsted If pt is not established with a provider, would they like to be referred to a provider to establish care? No .   Dental Screening: Recommended annual dental exams for proper oral hygiene  Community Resource Referral / Chronic Care Management: CRR required this visit?  No   CCM required this visit?  No      Plan:     I have personally reviewed and noted the following in the patient's chart:   . Medical and social history . Use of alcohol, tobacco or illicit drugs  . Current medications and supplements . Functional ability and status . Nutritional status . Physical activity . Advanced directives . List of other physicians . Hospitalizations, surgeries, and ER visits in previous 12 months . Vitals . Screenings to include cognitive, depression, and falls . Referrals and  appointments  In addition, I have reviewed and discussed with patient certain preventive protocols, quality metrics, and best practice recommendations. A written personalized care plan for preventive services as well as general preventive health recommendations were provided to patient.     Sheral Flow, LPN   [id number]   Nurse Notes: n/a

## 2020-10-24 ENCOUNTER — Ambulatory Visit: Payer: Medicare Other | Attending: Internal Medicine

## 2020-10-24 ENCOUNTER — Other Ambulatory Visit: Payer: Self-pay

## 2020-10-24 DIAGNOSIS — Z23 Encounter for immunization: Secondary | ICD-10-CM

## 2020-10-24 NOTE — Progress Notes (Signed)
   Covid-19 Vaccination Clinic  Name:  Collin [name]    MRN: [id number] DOB: 03/23/1925  10/24/2020  Collin [name] was observed post Covid-19 immunization for 15 minutes without incident. He was provided with Vaccine Information Sheet and instruction to access the V-Safe system.   Collin [name] was instructed to call 911 with any severe reactions post vaccine: Marland Kitchen Difficulty breathing  . Swelling of face and throat  . A fast heartbeat  . A bad rash all over body  . Dizziness and weakness

## 2020-11-03 ENCOUNTER — Other Ambulatory Visit: Payer: Self-pay

## 2020-11-03 MED ORDER — CARVEDILOL 3.125 MG PO TABS: 3.1250 mg | ORAL_TABLET | Freq: Two times a day (BID) | ORAL | 2 refills | Status: DC

## 2020-11-03 MED ORDER — ROSUVASTATIN CALCIUM 5 MG PO TABS: 5.0000 mg | ORAL_TABLET | Freq: Every day | ORAL | 2 refills | Status: DC

## 2020-12-17 ENCOUNTER — Encounter: Payer: Self-pay | Admitting: Internal Medicine

## 2020-12-18 ENCOUNTER — Ambulatory Visit (HOSPITAL_COMMUNITY)
Admission: EM | Admit: 2020-12-18 | Discharge: 2020-12-18 | Disposition: A | Discharge status: Home / Self Care | Payer: Medicare Other | Attending: Urgent Care | Admitting: Urgent Care

## 2020-12-18 ENCOUNTER — Ambulatory Visit (INDEPENDENT_AMBULATORY_CARE_PROVIDER_SITE_OTHER): Payer: Medicare Other

## 2020-12-18 ENCOUNTER — Other Ambulatory Visit: Payer: Self-pay

## 2020-12-18 ENCOUNTER — Encounter (HOSPITAL_COMMUNITY): Payer: Self-pay | Admitting: Emergency Medicine

## 2020-12-18 DIAGNOSIS — J449 Chronic obstructive pulmonary disease, unspecified: Secondary | ICD-10-CM | POA: Insufficient documentation

## 2020-12-18 DIAGNOSIS — Z955 Presence of coronary angioplasty implant and graft: Secondary | ICD-10-CM | POA: Diagnosis not present

## 2020-12-18 DIAGNOSIS — R059 Cough, unspecified: Secondary | ICD-10-CM | POA: Insufficient documentation

## 2020-12-18 DIAGNOSIS — Z87891 Personal history of nicotine dependence: Secondary | ICD-10-CM | POA: Insufficient documentation

## 2020-12-18 DIAGNOSIS — J069 Acute upper respiratory infection, unspecified: Secondary | ICD-10-CM

## 2020-12-18 DIAGNOSIS — Z905 Acquired absence of kidney: Secondary | ICD-10-CM | POA: Diagnosis not present

## 2020-12-18 DIAGNOSIS — Z20822 Contact with and (suspected) exposure to covid-19: Secondary | ICD-10-CM | POA: Diagnosis not present

## 2020-12-18 DIAGNOSIS — I130 Hypertensive heart and chronic kidney disease with heart failure and stage 1 through stage 4 chronic kidney disease, or unspecified chronic kidney disease: Secondary | ICD-10-CM | POA: Diagnosis not present

## 2020-12-18 DIAGNOSIS — R0789 Other chest pain: Secondary | ICD-10-CM | POA: Diagnosis not present

## 2020-12-18 DIAGNOSIS — Z7982 Long term (current) use of aspirin: Secondary | ICD-10-CM | POA: Diagnosis not present

## 2020-12-18 DIAGNOSIS — I252 Old myocardial infarction: Secondary | ICD-10-CM | POA: Insufficient documentation

## 2020-12-18 DIAGNOSIS — R0602 Shortness of breath: Secondary | ICD-10-CM

## 2020-12-18 DIAGNOSIS — Z8249 Family history of ischemic heart disease and other diseases of the circulatory system: Secondary | ICD-10-CM | POA: Insufficient documentation

## 2020-12-18 DIAGNOSIS — I509 Heart failure, unspecified: Secondary | ICD-10-CM | POA: Insufficient documentation

## 2020-12-18 DIAGNOSIS — I2510 Atherosclerotic heart disease of native coronary artery without angina pectoris: Secondary | ICD-10-CM | POA: Insufficient documentation

## 2020-12-18 DIAGNOSIS — Z79899 Other long term (current) drug therapy: Secondary | ICD-10-CM | POA: Diagnosis not present

## 2020-12-18 DIAGNOSIS — N183 Chronic kidney disease, stage 3 unspecified: Secondary | ICD-10-CM | POA: Diagnosis not present

## 2020-12-18 DIAGNOSIS — N189 Chronic kidney disease, unspecified: Secondary | ICD-10-CM

## 2020-12-18 LAB — RESP PANEL BY RT-PCR (FLU A&B, COVID) ARPGX2
Influenza A by PCR: NEGATIVE
Influenza B by PCR: NEGATIVE
SARS Coronavirus 2 by RT PCR: NEGATIVE

## 2020-12-18 MED ORDER — BENZONATATE 100 MG PO CAPS
100.0000 mg | ORAL_CAPSULE | Freq: Three times a day (TID) | ORAL | 0 refills | Status: DC | PRN
Start: 2020-12-18 — End: 2020-12-23

## 2020-12-18 MED ORDER — CETIRIZINE HCL 5 MG PO TABS: 5.0000 mg | ORAL_TABLET | Freq: Every day | ORAL | 0 refills | Status: DC

## 2020-12-18 NOTE — ED Triage Notes (Signed)
Pt c/o nasal congestion, SOB, and chest congestion with intermittent non productive cough onset yesterday. Pt denies hx of asthma, COPD, emphysema. He states he has felt SOB like this before and it subsided naturally.

## 2020-12-18 NOTE — ED Provider Notes (Signed)
[place]   MRN: [id number] DOB: [birth year]  Subjective:   Collin [name] is a 86 y.o. male presenting for 1 day history of acute onset sinus congestion, shortness of breath, coughing that has elicited chest soreness over his mid sternum.  Patient has a history of an MI and has had a total of 4 stents placed.  The last one was done about a year ago.  He also has a history of heart failure.  He does have a history of COPD in his chart, used to smoke more than 20 years ago.  Does not use any inhalers.  He is Covid vaccinated.  Has also gotten his booster.  No current facility-administered medications for this encounter.  Current Outpatient Medications:    acetaminophen (TYLENOL) 500 MG tablet, Take 500 mg by mouth every 6 (six) hours as needed for moderate pain or headache. , Disp: , Rfl:    alfuzosin (UROXATRAL) 10 MG 24 hr tablet, Take 10 mg by mouth daily. , Disp: , Rfl:    aspirin EC 81 MG tablet, Take 1 tablet (81 mg total) by mouth daily., Disp: , Rfl:    carvedilol (COREG) 3.125 MG tablet, Take 1 tablet (3.125 mg total) by mouth 2 (two) times daily., Disp: 180 tablet, Rfl: 2   clopidogrel (PLAVIX) 75 MG tablet, Take 1 tablet (75 mg total) by mouth daily., Disp: 90 tablet, Rfl: 3   finasteride (PROSCAR) 5 MG tablet, Take 5 mg by mouth daily., Disp: , Rfl:    losartan (COZAAR) 50 MG tablet, Take 0.5 tablets (25 mg total) by mouth 2 (two) times daily., Disp: 90 tablet, Rfl: 3   metroNIDAZOLE (METROGEL) 0.75 % gel, Apply topically 2 (two) times daily., Disp: , Rfl:    Multiple Vitamin (MULTIVITAMIN) tablet, Take 1 tablet by mouth daily.  , Disp: , Rfl:    nitroGLYCERIN (NITROSTAT) 0.4 MG SL tablet, Place 1 tablet (0.4 mg total) under the tongue every 5 (five) minutes as needed for chest pain., Disp: 25 tablet, Rfl: 6   rosuvastatin (CRESTOR) 5 MG tablet, Take 1 tablet (5 mg total) by mouth at bedtime., Disp: 90 tablet, Rfl: 2   sodium chloride (OCEAN) 0.65 %  SOLN nasal spray, Place 1 spray into both nostrils as needed for congestion., Disp: , Rfl:    triamcinolone ointment (KENALOG) 0.1 %, Apply 1 application topically daily. , Disp: , Rfl:    Allergies  Allergen Reactions   Atorvastatin     Muscle aches    Peanut Oil Rash    sneezing   Trandolapril Cough    Past Medical History:  Diagnosis Date   Allergy    Anemia    Arthritis    gout   Bladder cancer ([facility])    BPH (benign prostatic hyperplasia)    CAD (coronary artery disease) 2005   a. diaphragmatic MI s/p DES to RCA 2005, residual disease. b. [place] 11/2019 s/p DES to PDA, mRCA, mid Cx.   Cancer ([place]) 02/28/2001   Left ureter cancer/bladder   Carcinoma, renal cell ([place])    Cardiomyopathy ([place])    a. EF 45-50% by echo 12/2018.   Carotid artery disease ([place])    Doppler, September, 2010, normal, no carotid artery disease   CKD (chronic kidney disease), stage III ([facility])    COPD (chronic obstructive pulmonary disease) ([place])    Dysfunction of eustachian tube    Ejection fraction    EF 55%, echo, January, 2009   Encounter for  long-term (current) use of other medications    Enlarged prostate    GERD (gastroesophageal reflux disease)    Gynecomastia    b/l   HTN (hypertension)    Hx of cystoscopy 11/02/12   negative   Hx of skin cancer, basal cell    Hypercholesterolemia    Hyperlipidemia    Low back pain syndrome    Myocardial infarction ([place])    hx acute   Nocturia    Organic impotence    Pulmonary nodule    Chest CT, followed   Reactive airway disease    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   Staph infection 04/1997   Thrombocytopenia [facility])      Past Surgical History:  Procedure Laterality Date   BACK SURGERY  02/1996   bladder tumors  2003, 2004, 01/2009   removed   CARDIAC CATHETERIZATION     CHOLECYSTECTOMY  07/2003   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   sutured, right   CORNEAL TRANSPLANT  10/2008   left    CORONARY STENT INTERVENTION  11/28/2019   CORONARY STENT INTERVENTION N/A 11/28/2019   Procedure: CORONARY STENT INTERVENTION;  Surgeon: Burnell Blanks, MD;  Location: [facility];  Service: Cardiovascular;  Laterality: N/A;   LEFT HEART CATH AND CORONARY ANGIOGRAPHY N/A 11/27/2019   Procedure: LEFT HEART CATH AND CORONARY ANGIOGRAPHY;  Surgeon: Burnell Blanks, MD;  Location: [facility];  Service: Cardiovascular;  Laterality: N/A;   NEPHRECTOMY  02/2001   left kidney, ureter   osteomyelitis     staph aureus   URETERECTOMY  2003   for cancer    Family History  Problem Relation Age of Onset   Heart failure Mother 91   Diabetes Mother    Coronary artery disease Brother    Diabetes Brother    Drug abuse Brother    Diabetes Sister    Diabetes Sister     Social History   Tobacco Use   Smoking status: Former Smoker    Types: Pipe    Quit date: 12/27/1991    Years since quitting: 28.9   Smokeless tobacco: Former Systems developer   Tobacco comment: Quit 20-30 years ago as of 2012  Vaping Use   Vaping Use: Never used  Substance Use Topics   Alcohol use: No    Alcohol/week: 0.0 standard drinks   Drug use: No    ROS   Objective:   Vitals: BP (!) 159/84 (BP Location: Left Arm)    Pulse 72    Temp 98.9 F (37.2 C) (Oral)    SpO2 97%   Physical Exam Constitutional:      General: He is not in acute distress.    Appearance: Normal appearance. He is well-developed. He is not ill-appearing, toxic-appearing or diaphoretic.  HENT:     Head: Normocephalic and atraumatic.     Right Ear: External ear normal.     Left Ear: External ear normal.     Nose: Nose normal.     Mouth/Throat:     Mouth: Mucous membranes are moist.     Pharynx: Oropharynx is clear.  Eyes:     General: No scleral icterus.    Extraocular Movements: Extraocular movements intact.     Pupils: Pupils are equal, round, and reactive to light.  Cardiovascular:     Rate and  Rhythm: Normal rate and regular rhythm.     Heart sounds: Normal heart sounds. No murmur heard. No friction rub.  No gallop.   Pulmonary:     Effort: Pulmonary effort is normal. No respiratory distress.     Breath sounds: Normal breath sounds. No stridor. No wheezing, rhonchi or rales.  Neurological:     Mental Status: He is alert and oriented to person, place, and time.  Psychiatric:        Mood and Affect: Mood normal.        Behavior: Behavior normal.        Thought Content: Thought content normal.    DG Chest 2 View  Result Date: 12/18/2020 CLINICAL DATA:  Shortness of breath.  Nasal congestion.  COPD. EXAM: CHEST - 2 VIEW COMPARISON:  11/01/2019 FINDINGS: Midline trachea. Normal heart size and mediastinal contours for age. No pleural effusion or pneumothorax. Clear lungs. IMPRESSION: No acute cardiopulmonary disease. Electronically Signed   By: Abigail Miyamoto M.D.   On: 12/18/2020 14:13   ED ECG REPORT   Date: 12/18/2020  Rate: 63  Rhythm: normal sinus rhythm  QRS Axis: normal  Intervals: normal  ST/T Wave abnormalities: nonspecific T wave changes  Conduction Disutrbances:none  Narrative Interpretation: Sinus rhythm at 63 bpm with nonspecific T wave inversion in lead aVL.  Almost identical to his last EKG in 2020.  Old EKG Reviewed: unchanged  I have personally reviewed the EKG tracing and agree with the computerized printout as noted.   Assessment and Plan :   PDMP not reviewed this encounter.  1. Viral URI with cough   2. Shortness of breath   3. Chest discomfort   4. History of heart artery stent   5. Coronary artery disease involving native heart without angina pectoris, unspecified vessel or lesion type   6. Solitary kidney, acquired   7. Chronic kidney disease, unspecified CKD stage     Will manage for viral illness such as viral URI, viral syndrome, viral rhinitis, COVID-19. Counseled patient on nature of COVID-19 including modes of transmission, diagnostic  testing, management and supportive care.  Offered scripts for symptomatic relief. COVID 19 testing is pending. Counseled patient on potential for adverse effects with medications prescribed/recommended today, ER and return-to-clinic precautions discussed, patient verbalized understanding.     Jaynee Eagles, PA-C 12/18/20 1429

## 2020-12-21 NOTE — Telephone Encounter (Signed)
FYI: [facility] Report/Call: 12/18/20 --Caller states he thinks he has Covid and does not know what to do about it. He has nasal congestion, sneezing, sore throat and congested cough. No fever. Fully vaccinated  Advised go to ED now.  Patient went to Urgent care.

## 2020-12-23 ENCOUNTER — Telehealth (INDEPENDENT_AMBULATORY_CARE_PROVIDER_SITE_OTHER): Payer: Medicare Other | Admitting: Family Medicine

## 2020-12-23 ENCOUNTER — Encounter: Payer: Self-pay | Admitting: Family Medicine

## 2020-12-23 VITALS — Ht 67.0 in | Wt 170.0 lb

## 2020-12-23 DIAGNOSIS — J329 Chronic sinusitis, unspecified: Secondary | ICD-10-CM

## 2020-12-23 DIAGNOSIS — B9689 Other specified bacterial agents as the cause of diseases classified elsewhere: Secondary | ICD-10-CM | POA: Diagnosis not present

## 2020-12-23 DIAGNOSIS — R059 Cough, unspecified: Secondary | ICD-10-CM | POA: Diagnosis not present

## 2020-12-23 MED ORDER — AMOXICILLIN 500 MG PO CAPS: 500.0000 mg | ORAL_CAPSULE | Freq: Three times a day (TID) | ORAL | 0 refills | Status: DC

## 2020-12-23 MED ORDER — FLUTICASONE PROPIONATE 50 MCG/ACT NA SUSP: 1.0000 | Freq: Two times a day (BID) | NASAL | 0 refills | Status: DC

## 2020-12-23 NOTE — Progress Notes (Signed)
VIRTUAL VISIT VIA VIDEO  I connected with Collin [name] on 12/23/20 at  2:00 PM EST by elemedicine application and verified that I am speaking with the correct person using two identifiers. Location patient: Home Location provider: [facility], Office Persons participating in the virtual visit: Patient, Dr. Raoul Pitch and Samul Dada, CMA  I discussed the limitations of evaluation and management by telemedicine and the availability of in person appointments. The patient expressed understanding and agreed to proceed.   SUBJECTIVE Chief Complaint  Patient presents with  . Cough    Started 12/17/2020 and went to an urgent care and checked for covid and flu and never got his results.   . head congestion    HPI: Collin [name] is a 86 y.o. male present for illness of cough and head congestion. He reports he was seen at [facility] 12/17/2020 and did not receive his results. Per emr, negative flu and covid tests 12/24. He endorses cough, head congestion, sinus pressure and runny nose as remained. The "coughing pills" gave him urinary retention.  He denies fever, chills, shortness of breath or wheezing.   He is tolerating PO.  He has a sig. pmh of MI, COPD, CAD w/ stents, CKD, cancer history. Moderna series and booster completed Flu shot UTD. He has been taking zyrtec 5 mg daily.    ROS: See pertinent positives and negatives per HPI.  Patient Active Problem List   Diagnosis Date Noted  . Unstable angina ([place])   . Shortness of breath 11/01/2019  . Chest pressure 11/01/2019  . Chest tightness 01/07/2019  . Thrombocytopenia ([place])   . Skin cancer   . Pulmonary nodule   . Nocturia   . Myocardial infarction ([place])   . Low back pain syndrome   . COPD (chronic obstructive pulmonary disease) ([place])   . BPH (benign prostatic hyperplasia)   . Bladder cancer ([place])   . Arthritis   . CKD (chronic kidney disease), stage III 07/10/2017  . Diabetes ([place]) 04/15/2017  . Fatty liver 06/18/2016  . Rash  and nonspecific skin eruption 06/13/2016  . H/O bacterial endocarditis 04/06/2016  . Myalgia and myositis 12/18/2015  . Abnormal CT of the abdomen 12/31/2013  . Abnormal chest x-ray 12/31/2013  . Gynecomastia   . Spinal stenosis, lumbar 05/12/2012  . CAD (coronary artery disease)   . Carotid artery disease ([place])   . CERVICAL RADICULOPATHY, LEFT 12/07/2009  . SKIN CANCER, HX OF 12/07/2009  . Renal cell carcinoma ([place]) 07/18/2009  . ANEMIA, 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  . GERD 07/18/2009  . Essential hypertension 04/23/2008  . Dyslipidemia 01/25/2007  . Cancer ([place]) 02/28/2001    Social History   Tobacco Use  . Smoking status: Former Smoker    Types: Pipe    Quit date: 12/27/1991    Years since quitting: 29.0  . Smokeless tobacco: Former Systems developer  . Tobacco comment: Quit 20-30 years ago as of 2012  Substance Use Topics  . Alcohol use: No    Alcohol/week: 0.0 standard drinks    Current Outpatient Medications:  .  acetaminophen (TYLENOL) 500 MG tablet, Take 500 mg by mouth every 6 (six) hours as needed for moderate pain or headache. , Disp: , Rfl:  .  alfuzosin (UROXATRAL) 10 MG 24 hr tablet, Take 10 mg by mouth daily. , Disp: , Rfl:  .  amoxicillin (AMOXIL) 500 MG capsule, Take 1 capsule (500 mg total) by mouth 3 (three) times daily., Disp: 30 capsule, Rfl: 0 .  aspirin  EC 81 MG tablet, Take 1 tablet (81 mg total) by mouth daily., Disp: , Rfl:  .  carvedilol (COREG) 3.125 MG tablet, Take 1 tablet (3.125 mg total) by mouth 2 (two) times daily., Disp: 180 tablet, Rfl: 2 .  cetirizine (ZYRTEC) 5 MG tablet, Take 1 tablet (5 mg total) by mouth daily., Disp: 30 tablet, Rfl: 0 .  clopidogrel (PLAVIX) 75 MG tablet, Take 1 tablet (75 mg total) by mouth daily., Disp: 90 tablet, Rfl: 3 .  finasteride (PROSCAR) 5 MG tablet, Take 5 mg by mouth daily., Disp: , Rfl:  .  fluticasone (FLONASE) 50 MCG/ACT nasal spray, Place 1 spray into both nostrils in the morning and at bedtime., Disp: 16 g, Rfl: 0 .   losartan (COZAAR) 50 MG tablet, Take 0.5 tablets (25 mg total) by mouth 2 (two) times daily., Disp: 90 tablet, Rfl: 3 .  metroNIDAZOLE (METROGEL) 0.75 % gel, Apply topically 2 (two) times daily., Disp: , Rfl:  .  Multiple Vitamin (MULTIVITAMIN) tablet, Take 1 tablet by mouth daily., Disp: , Rfl:  .  nitroGLYCERIN (NITROSTAT) 0.4 MG SL tablet, Place 1 tablet (0.4 mg total) under the tongue every 5 (five) minutes as needed for chest pain., Disp: 25 tablet, Rfl: 6 .  rosuvastatin (CRESTOR) 5 MG tablet, Take 1 tablet (5 mg total) by mouth at bedtime., Disp: 90 tablet, Rfl: 2 .  sodium chloride (OCEAN) 0.65 % SOLN nasal spray, Place 1 spray into both nostrils as needed for congestion., Disp: , Rfl:  .  triamcinolone ointment (KENALOG) 0.1 %, Apply 1 application topically daily. , Disp: , Rfl:   Allergies  Allergen Reactions  . Tessalon Perles [Benzonatate]     Urinary retention   . Atorvastatin     Muscle aches   . Peanut Oil Rash    sneezing  . Trandolapril Cough    OBJECTIVE: Ht 5\' 7"  (1.702 m)   Wt 170 lb (77.1 kg)   BMI 26.63 kg/m  Gen: No acute distress. Nontoxic in appearance.  HENT: AT. [place].  MMM.  Eyes:Pupils Equal Round Reactive to light, Extraocular movements intact,  Conjunctiva without redness, discharge or icterus. Chest: Cough not present on exam. No shortness of breath Skin: no  rashes, purpura or petechiae.  Neuro: Alert. Oriented x3    ASSESSMENT AND PLAN: Collin [name] is a 86 y.o. male present for  Bacterial sinusitis/Cough Informed pt of neg flu and covid tests from [facility], hydrate.  Start flonase. Continue zyrtec and start coricidin OTC.  amox TID prescribed, take until completed. .  F/U 1 week if not improved> sooner if worsening      Howard Pouch, DO 12/23/2020   Return in about 1 week (around 12/30/2020).  No orders of the defined types were placed in this encounter.  Meds ordered this encounter  Medications  . amoxicillin (AMOXIL) 500 MG  capsule    Sig: Take 1 capsule (500 mg total) by mouth 3 (three) times daily.    Dispense:  30 capsule    Refill:  0  . fluticasone (FLONASE) 50 MCG/ACT nasal spray    Sig: Place 1 spray into both nostrils in the morning and at bedtime.    Dispense:  16 g    Refill:  0   Referral Orders  No referral(s) requested today

## 2021-01-01 ENCOUNTER — Other Ambulatory Visit: Payer: Self-pay

## 2021-01-01 ENCOUNTER — Encounter: Payer: Self-pay | Admitting: Podiatry

## 2021-01-01 ENCOUNTER — Ambulatory Visit (INDEPENDENT_AMBULATORY_CARE_PROVIDER_SITE_OTHER): Payer: Medicare Other | Admitting: Podiatry

## 2021-01-01 DIAGNOSIS — M79674 Pain in right toe(s): Secondary | ICD-10-CM

## 2021-01-01 DIAGNOSIS — N1832 Chronic kidney disease, stage 3b: Secondary | ICD-10-CM | POA: Diagnosis not present

## 2021-01-01 DIAGNOSIS — B351 Tinea unguium: Secondary | ICD-10-CM | POA: Diagnosis not present

## 2021-01-01 DIAGNOSIS — E119 Type 2 diabetes mellitus without complications: Secondary | ICD-10-CM

## 2021-01-01 DIAGNOSIS — M79675 Pain in left toe(s): Secondary | ICD-10-CM

## 2021-01-01 DIAGNOSIS — D689 Coagulation defect, unspecified: Secondary | ICD-10-CM | POA: Diagnosis not present

## 2021-01-01 NOTE — Progress Notes (Signed)
This patient returns to my office for at risk foot care.  This patient requires this care by a professional since this patient will be at risk due to having thrombocytopenia, CKD, and diabetes.  This patient is unable to cut nails himself since the patient cannot reach his nails.These nails are painful walking and wearing shoes.  This patient presents for at risk foot care today. . Patient is taking plavix.  General Appearance  Alert, conversant and in no acute stress.  Vascular  Dorsalis pedis and posterior tibial  pulses are palpable  bilaterally.  Capillary return is within normal limits  bilaterally. Temperature is within normal limits  bilaterally.  Neurologic  Senn-Weinstein monofilament wire test within normal limits  bilaterally. Muscle power within normal limits bilaterally.  Nails Thick disfigured discolored nails with subungual debris  from hallux to fifth toes bilaterally. No evidence of bacterial infection or drainage bilaterally.  Orthopedic  No limitations of motion  feet .  No crepitus or effusions noted.  No bony pathology or digital deformities noted.  HAV  B/L.  Skin  normotropic skin with no porokeratosis noted bilaterally.  No signs of infections or ulcers noted.     Onychomycosis  Pain in right toes  Pain in left toes  Consent was obtained for treatment procedures.   Mechanical debridement of nails 1-5  bilaterally performed with a nail nipper.  Filed with dremel without incident.    Return office visit    3 months                  Told patient to return for periodic foot care and evaluation due to potential at risk complications.   Jazzmyne Rasnick DPM  

## 2021-01-18 ENCOUNTER — Other Ambulatory Visit: Payer: Self-pay

## 2021-01-18 ENCOUNTER — Other Ambulatory Visit: Payer: Medicare Other | Admitting: *Deleted

## 2021-01-18 DIAGNOSIS — I2510 Atherosclerotic heart disease of native coronary artery without angina pectoris: Secondary | ICD-10-CM

## 2021-01-18 LAB — BASIC METABOLIC PANEL
BUN/Creatinine Ratio: 16 (ref 10–24)
BUN: 23 mg/dL (ref 10–36)
CO2: 26 mmol/L (ref 20–29)
Calcium: 9.2 mg/dL (ref 8.6–10.2)
Chloride: 101 mmol/L (ref 96–106)
Creatinine, Ser: 1.48 mg/dL — ABNORMAL HIGH (ref 0.76–1.27)
GFR calc Af Amer: 46 mL/min/{1.73_m2} — ABNORMAL LOW (ref >59)
GFR calc non Af Amer: 40 mL/min/{1.73_m2} — ABNORMAL LOW (ref >59)
Glucose: 149 mg/dL — ABNORMAL HIGH (ref 65–99)
Potassium: 4.5 mmol/L (ref 3.5–5.2)
Sodium: 140 mmol/L (ref 134–144)

## 2021-01-18 LAB — LIPID PANEL
Chol/HDL Ratio: 3.6 ratio (ref 0.0–5.0)
Cholesterol, Total: 139 mg/dL (ref 100–199)
HDL: 39 mg/dL — ABNORMAL LOW (ref >39)
LDL Chol Calc (NIH): 53 mg/dL (ref 0–99)
Triglycerides: 301 mg/dL — ABNORMAL HIGH (ref 0–149)
VLDL Cholesterol Cal: 47 mg/dL — ABNORMAL HIGH (ref 5–40)

## 2021-01-18 LAB — HEPATIC FUNCTION PANEL
ALT: 23 IU/L (ref 0–44)
AST: 20 IU/L (ref 0–40)
Albumin: 4.5 g/dL (ref 3.5–4.6)
Alkaline Phosphatase: 68 IU/L (ref 44–121)
Bilirubin Total: 1.3 mg/dL — ABNORMAL HIGH (ref 0.0–1.2)
Bilirubin, Direct: 0.27 mg/dL (ref 0.00–0.40)
Total Protein: 6.4 g/dL (ref 6.0–8.5)

## 2021-01-21 ENCOUNTER — Other Ambulatory Visit: Payer: Self-pay

## 2021-01-21 NOTE — Patient Instructions (Addendum)
Try increasing you water intake.  You can try to take metamucil daily for your constipation.     Your a1c was checked today.       Medications changes include : none      Please followup in 6 months    Health Maintenance, Male Adopting a healthy lifestyle and getting preventive care are important in promoting health and wellness. Ask your health care provider about:  The right schedule for you to have regular tests and exams.  Things you can do on your own to prevent diseases and keep yourself healthy. What should I know about diet, weight, and exercise? Eat a healthy diet  Eat a diet that includes plenty of vegetables, fruits, low-fat dairy products, and lean protein.  Do not eat a lot of foods that are high in solid fats, added sugars, or sodium.   Maintain a healthy weight Body mass index (BMI) is a measurement that can be used to identify possible weight problems. It estimates body fat based on height and weight. Your health care provider can help determine your BMI and help you achieve or maintain a healthy weight. Get regular exercise Get regular exercise. This is one of the most important things you can do for your health. Most adults should:  Exercise for at least 150 minutes each week. The exercise should increase your heart rate and make you sweat (moderate-intensity exercise).  Do strengthening exercises at least twice a week. This is in addition to the moderate-intensity exercise.  Spend less time sitting. Even light physical activity can be beneficial. Watch cholesterol and blood lipids Have your blood tested for lipids and cholesterol at 85 years of age, then have this test every 5 years. You may need to have your cholesterol levels checked more often if:  Your lipid or cholesterol levels are high.  You are older than 85 years of age.  You are at high risk for heart disease. What should I know about cancer screening? Many types of cancers can be detected  early and may often be prevented. Depending on your health history and family history, you may need to have cancer screening at various ages. This may include screening for:  Colorectal cancer.  Prostate cancer.  Skin cancer.  Lung cancer. What should I know about heart disease, diabetes, and high blood pressure? Blood pressure and heart disease  High blood pressure causes heart disease and increases the risk of stroke. This is more likely to develop in people who have high blood pressure readings, are of African descent, or are overweight.  Talk with your health care provider about your target blood pressure readings.  Have your blood pressure checked: ? Every 3-5 years if you are 39-93 years of age. ? Every year if you are 29 years old or older.  If you are between the ages of 65 and 88 and are a current or former smoker, ask your health care provider if you should have a one-time screening for abdominal aortic aneurysm (AAA). Diabetes Have regular diabetes screenings. This checks your fasting blood sugar level. Have the screening done:  Once every three years after age 56 if you are at a normal weight and have a low risk for diabetes.  More often and at a younger age if you are overweight or have a high risk for diabetes. What should I know about preventing infection? Hepatitis B If you have a higher risk for hepatitis B, you should be screened for this virus. Talk with  your health care provider to find out if you are at risk for hepatitis B infection. Hepatitis C Blood testing is recommended for:  Everyone born from [birth year] through 1963.  Anyone with known risk factors for hepatitis C. Sexually transmitted infections (STIs)  You should be screened each year for STIs, including gonorrhea and chlamydia, if: ? You are sexually active and are younger than 85 years of age. ? You are older than 85 years of age and your health care provider tells you that you are at risk for this  type of infection. ? Your sexual activity has changed since you were last screened, and you are at increased risk for chlamydia or gonorrhea. Ask your health care provider if you are at risk.  Ask your health care provider about whether you are at high risk for HIV. Your health care provider may recommend a prescription medicine to help prevent HIV infection. If you choose to take medicine to prevent HIV, you should first get tested for HIV. You should then be tested every 3 months for as long as you are taking the medicine. Follow these instructions at home: Lifestyle  Do not use any products that contain nicotine or tobacco, such as cigarettes, e-cigarettes, and chewing tobacco. If you need help quitting, ask your health care provider.  Do not use street drugs.  Do not share needles.  Ask your health care provider for help if you need support or information about quitting drugs. Alcohol use  Do not drink alcohol if your health care provider tells you not to drink.  If you drink alcohol: ? Limit how much you have to 0-2 drinks a day. ? Be aware of how much alcohol is in your drink. In the [place] one drink equals one 12 oz bottle of beer (355 mL), one 5 oz glass of wine (148 mL), or one 1 oz glass of hard liquor (44 mL). General instructions  Schedule regular health, dental, and eye exams.  Stay current with your vaccines.  Tell your health care provider if: ? You often feel depressed. ? You have ever been abused or do not feel safe at home. Summary  Adopting a healthy lifestyle and getting preventive care are important in promoting health and wellness.  Follow your health care provider's instructions about healthy diet, exercising, and getting tested or screened for diseases.  Follow your health care provider's instructions on monitoring your cholesterol and blood pressure. This information is not intended to replace advice given to you by your health care provider. Make sure  you discuss any questions you have with your health care provider. Document Revised: 12/05/2018 Document Reviewed: 12/05/2018 Elsevier Patient Education  2021 [employer].

## 2021-01-21 NOTE — Progress Notes (Signed)
Subjective:    Patient ID: Collin [name], male    DOB: Oct 26, 1925, 87 y.o.   MRN: [id number]  HPI He is here for a physical exam.    He has a little constipation for the past couple of weeks.  He has not tried anything. He thinks he is not drinking enough water.  No change in diet or medications.    He has no other concerns.  Medications and allergies reviewed with patient and updated if appropriate.  Patient Active Problem List   Diagnosis Date Noted  . Unstable angina ([place])   . Shortness of breath 11/01/2019  . Chest pressure 11/01/2019  . Chest tightness 01/07/2019  . Thrombocytopenia ([place])   . Skin cancer   . Pulmonary nodule   . Nocturia   . Myocardial infarction ([place])   . Low back pain syndrome   . COPD (chronic obstructive pulmonary disease) ([place])   . BPH (benign prostatic hyperplasia)   . Bladder cancer ([place])   . Arthritis   . CKD (chronic kidney disease), stage III 07/10/2017  . Diabetes ([place]) 04/15/2017  . Fatty liver 06/18/2016  . Rash and nonspecific skin eruption 06/13/2016  . H/O bacterial endocarditis 04/06/2016  . Myalgia and myositis 12/18/2015  . Abnormal CT of the abdomen 12/31/2013  . Abnormal chest x-ray 12/31/2013  . Gynecomastia   . Spinal stenosis, lumbar 05/12/2012  . CAD (coronary artery disease)   . Carotid artery disease ([place])   . CERVICAL RADICULOPATHY, LEFT 12/07/2009  . SKIN CANCER, HX OF 12/07/2009  . Renal cell carcinoma ([place]) 07/18/2009  . ANEMIA, 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  . GERD 07/18/2009  . Essential hypertension 04/23/2008  . Dyslipidemia 01/25/2007  . Cancer ([place]) 02/28/2001    Current Outpatient Medications on File Prior to Visit  Medication Sig Dispense Refill  . acetaminophen (TYLENOL) 500 MG tablet Take 500 mg by mouth every 6 (six) hours as needed for moderate pain or headache.     . alfuzosin (UROXATRAL) 10 MG 24 hr tablet Take 10 mg by mouth daily.     Marland Kitchen aspirin EC 81 MG tablet Take 1 tablet (81 mg total) by mouth  daily.    . carvedilol (COREG) 3.125 MG tablet Take 1 tablet (3.125 mg total) by mouth 2 (two) times daily. 180 tablet 2  . clopidogrel (PLAVIX) 75 MG tablet Take 1 tablet (75 mg total) by mouth daily. 90 tablet 3  . finasteride (PROSCAR) 5 MG tablet Take 5 mg by mouth daily.    Marland Kitchen losartan (COZAAR) 50 MG tablet Take 0.5 tablets (25 mg total) by mouth 2 (two) times daily. 90 tablet 3  . metroNIDAZOLE (METROGEL) 0.75 % gel Apply topically 2 (two) times daily.    . Multiple Vitamin (MULTIVITAMIN) tablet Take 1 tablet by mouth daily.    . nitroGLYCERIN (NITROSTAT) 0.4 MG SL tablet Place 1 tablet (0.4 mg total) under the tongue every 5 (five) minutes as needed for chest pain. 25 tablet 6  . rosuvastatin (CRESTOR) 5 MG tablet Take 1 tablet (5 mg total) by mouth at bedtime. 90 tablet 2  . sodium chloride (OCEAN) 0.65 % SOLN nasal spray Place 1 spray into both nostrils as needed for congestion.    . triamcinolone ointment (KENALOG) 0.1 % Apply 1 application topically daily.     . cetirizine (ZYRTEC) 5 MG tablet Take 1 tablet (5 mg total) by mouth daily. (Patient not taking: Reported on 01/22/2021) 30 tablet 0  . fluticasone (FLONASE) 50 MCG/ACT nasal  spray Place 1 spray into both nostrils in the morning and at bedtime. (Patient not taking: Reported on 01/22/2021) 16 g 0   No current facility-administered medications on file prior to visit.    Past Medical History:  Diagnosis Date  . Allergy   . Anemia   . Arthritis    gout  . Bladder cancer ([place])   . BPH (benign prostatic hyperplasia)   . CAD (coronary artery disease) 2005   a. diaphragmatic MI s/p DES to RCA 2005, residual disease. b. [place] 11/2019 s/p DES to PDA, mRCA, mid Cx.  . Cancer ([place]) 02/28/2001   Left ureter cancer/bladder  . Carcinoma, renal cell ([place])   . Cardiomyopathy ([place])    a. EF 45-50% by echo 12/2018.  Marland Kitchen Carotid artery disease ([place])    Doppler, September, 2010, normal, no carotid artery disease  . CKD (chronic kidney disease),  stage III ([place])   . COPD (chronic obstructive pulmonary disease) ([place])   . Dysfunction of eustachian tube   . Ejection fraction    EF 55%, echo, January, 2009  . Encounter for long-term (current) use of other medications   . Enlarged prostate   . GERD (gastroesophageal reflux disease)   . Gynecomastia    b/l  . HTN (hypertension)   . Hx of cystoscopy 11/02/12   negative  . Hx of skin cancer, basal cell   . Hypercholesterolemia   . Hyperlipidemia   . Low back pain syndrome   . Myocardial infarction ([place])    hx acute  . Nocturia   . Organic impotence   . Pulmonary nodule    Chest CT, followed  . Reactive airway disease   . 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 Staph infection 04/1997  . Thrombocytopenia ([place])     Past Surgical History:  Procedure Laterality Date  . BACK SURGERY  02/1996  . bladder tumors  2003, 2004, 01/2009   removed  . CARDIAC CATHETERIZATION    . CHOLECYSTECTOMY  07/2003  . 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 CORONARY STENT INTERVENTION  11/28/2019  . CORONARY STENT INTERVENTION N/A 11/28/2019   Procedure: CORONARY STENT INTERVENTION;  Surgeon: Burnell Blanks, MD;  Location: [facility];  Service: Cardiovascular;  Laterality: N/A;  . LEFT HEART CATH AND CORONARY ANGIOGRAPHY N/A 11/27/2019   Procedure: LEFT HEART CATH AND CORONARY ANGIOGRAPHY;  Surgeon: Burnell Blanks, MD;  Location: [facility];  Service: Cardiovascular;  Laterality: N/A;  . NEPHRECTOMY  02/2001   left kidney, ureter  . osteomyelitis     staph aureus  . URETERECTOMY  2003   for cancer    Social History   Socioeconomic History  . Marital status: Widowed    Spouse name: Not on file  . Number of children: 1  . Years of education: Not on file  . Highest education level: Not on file  Occupational History  . 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  Smoking status: Former Smoker    Types: Pipe    Quit date: 12/27/1991    Years since quitting: 29.0  . Smokeless tobacco: Former Systems developer  . Tobacco comment: Quit 20-30 years ago as of 2012  Vaping Use  . Vaping Use: Never used  Substance and Sexual Activity  . Alcohol use: No    Alcohol/week: 0.0 standard drinks  .  Drug use: No  . Sexual activity: Not Currently  Other Topics Concern  . Not on file  Social History Narrative   30 minutes a day for 5 days a week   Social Determinants of Health   Financial Resource Strain: Low Risk   . Difficulty of Paying Living Expenses: Not hard at all  Food Insecurity: No Food Insecurity  . Worried About Charity fundraiser in the Last Year: Never true  . Ran Out of Food in the Last Year: Never true  Transportation Needs: No Transportation Needs  . Lack of Transportation (Medical): No  . Lack of Transportation (Non-Medical): No  Physical Activity: Sufficiently Active  . Days of Exercise per Week: 5 days  . Minutes of Exercise per Session: 30 min  Stress: No Stress Concern Present  . Feeling of Stress : Not at all  Social Connections: Moderately Integrated  . Frequency of Communication with Friends and Family: Twice a week  . Frequency of Social Gatherings with Friends and Family: Once a week  . Attends Religious Services: More than 4 times per year  . Active Member of Clubs or Organizations: Yes  . Attends Archivist Meetings: More than 4 times per year  . Marital Status: Widowed    Family History  Problem Relation Age of Onset  . Heart failure Mother 17  . Diabetes Mother   . Coronary artery disease Brother   . Diabetes Brother   . Drug abuse Brother   . Diabetes Sister   . Diabetes Sister     Review of Systems  Constitutional: Negative for fever.  Eyes: Negative for visual disturbance.  Respiratory: Positive for shortness of breath (with exertion - chronic - no change). Negative for cough and wheezing.   Cardiovascular:  Negative for chest pain, palpitations and leg swelling.  Gastrointestinal: Positive for constipation. Negative for abdominal pain, blood in stool, diarrhea and nausea.       Rare gerd  Genitourinary: Positive for difficulty urinating (sees urology). Negative for dysuria.  Musculoskeletal: Positive for back pain (lower back - intermittent - no change).  Skin: Negative for rash.  Neurological: Negative for light-headedness and headaches.  Psychiatric/Behavioral: Negative for dysphoric mood. The patient is not nervous/anxious.        Objective:   Vitals:   01/22/21 0940  BP: 118/72  Pulse: 60  Temp: 98 F (36.7 C)  SpO2: 97%   Filed Weights   01/22/21 0940  Weight: 165 lb 12.8 oz (75.2 kg)   Body mass index is 25.97 kg/m.  BP Readings from Last 3 Encounters:  01/22/21 118/72  12/18/20 (!) 159/84  10/15/20 122/80    Wt Readings from Last 3 Encounters:  01/22/21 165 lb 12.8 oz (75.2 kg)  12/23/20 170 lb (77.1 kg)  10/15/20 169 lb (76.7 kg)     Physical Exam Constitutional: He appears well-developed and well-nourished. No distress.  HENT:  Head: Normocephalic and atraumatic.  Right Ear: External ear normal.  Left Ear: External ear normal.  Mouth/Throat: Oropharynx is clear and moist.  Normal ear canals and TM b/l  Eyes: Conjunctivae and EOM are normal.  Neck: Neck supple. No tracheal deviation present. No thyromegaly present.  No carotid bruit  Cardiovascular: Normal rate, regular rhythm, normal heart sounds and intact distal pulses.   No murmur heard. Pulmonary/Chest: Effort normal and breath sounds normal. No respiratory distress. He has no wheezes. He has no rales.  Abdominal: Soft. He exhibits no distension. There is no tenderness.  Genitourinary: deferred  Musculoskeletal: He exhibits no edema.  Lymphadenopathy:   He has no cervical adenopathy.  Skin: Skin is warm and dry. He is not diaphoretic.  Psychiatric: He has a normal mood and affect. His behavior is  normal.         Assessment & Plan:   Physical exam: Screening blood work  ordered Immunizations  Had Flu vac, had All covid vac Colonoscopy   N/a  Eye exams   Due - goes every year - [place] Exercise   regular Weight  Good for age Substance abuse   none  See Problem List for Assessment and Plan of chronic medical problems.   This visit occurred during the SARS-CoV-2 public health emergency.  Safety protocols were in place, including screening questions prior to the visit, additional usage of staff PPE, and extensive cleaning of exam room while observing appropriate contact time as indicated for disinfecting solutions.

## 2021-01-22 ENCOUNTER — Encounter: Payer: Self-pay | Admitting: Internal Medicine

## 2021-01-22 ENCOUNTER — Ambulatory Visit (INDEPENDENT_AMBULATORY_CARE_PROVIDER_SITE_OTHER): Payer: Medicare Other | Admitting: Internal Medicine

## 2021-01-22 VITALS — BP 118/72 | HR 60 | Temp 98.0°F | Ht 67.0 in | Wt 165.8 lb

## 2021-01-22 DIAGNOSIS — N1831 Chronic kidney disease, stage 3a: Secondary | ICD-10-CM

## 2021-01-22 DIAGNOSIS — E785 Hyperlipidemia, unspecified: Secondary | ICD-10-CM | POA: Diagnosis not present

## 2021-01-22 DIAGNOSIS — E1122 Type 2 diabetes mellitus with diabetic chronic kidney disease: Secondary | ICD-10-CM | POA: Diagnosis not present

## 2021-01-22 DIAGNOSIS — Z0000 Encounter for general adult medical examination without abnormal findings: Secondary | ICD-10-CM

## 2021-01-22 DIAGNOSIS — I10 Essential (primary) hypertension: Secondary | ICD-10-CM | POA: Diagnosis not present

## 2021-01-22 DIAGNOSIS — N1832 Chronic kidney disease, stage 3b: Secondary | ICD-10-CM

## 2021-01-22 LAB — POCT GLYCOSYLATED HEMOGLOBIN (HGB A1C)
HbA1c, POC (controlled diabetic range): 6.1 % (ref 0.0–7.0)
HbA1c, POC (prediabetic range): 6.1 % (ref 5.7–6.4)
Hemoglobin A1C: 6.1 % — AB (ref 4.0–5.6)

## 2021-01-22 NOTE — Assessment & Plan Note (Signed)
Chronic Diet controlled A1c today 6.1% Continue with diet controlled

## 2021-01-22 NOTE — Assessment & Plan Note (Signed)
Chronic Kidney function has been stable-reviewed recent blood work

## 2021-01-22 NOTE — Assessment & Plan Note (Signed)
Chronic BP well controlled Continue coreg 3.125 mg BID, losartan 25 mg twice daily

## 2021-01-22 NOTE — Assessment & Plan Note (Signed)
Chronic LDL well controlled, triglycerides elevated Continue rosuvastatin 5 mg nightly Discussed lifestyle changes to help improve triglycerides-we will also discuss with cardiology

## 2021-01-24 ENCOUNTER — Encounter: Payer: Self-pay | Admitting: Cardiovascular Disease

## 2021-01-24 NOTE — Progress Notes (Signed)
Cardiology Office Note   Date:  01/25/2021   ID:  Collin [name], DOB 1925/08/17, MRN [id number]  PCP:  Binnie Rail, MD  Cardiologist:  Mertie Moores, MD , previous patient of Dr. Ron Parker  Chief Complaint  Patient presents with  . Hypertension  . Hyperlipidemia  . Coronary Artery Disease   Problem List 1. CAD- s/p stenting in 2005.  2. Essential HTN       Collin [name] is a 87 y.o. male who presents for follow-up of coronary disease.  Had a stent placed by Dr. Olevia Perches in 2005.  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   Goes bowling twice a week Took Atorvastatin for years but stopped due to leg pain .  Last lipids were drawn in Dec.  - showed markedly elevated Trig levels and high chol levels.   April 21, 2017:  Collin [name] has a hx of CAD Hyperlipidemia Was recently diagnosed with DM and He has greatly improved his diet. He's cut back on lots of carbohydrates. He's lost about 5 pounds. He's noticed that his blood pressure has been low. This morning he took only half of his valsartan tablet.  Takes SBE prophylasix - has done so for years.  No prosthetic valve and no artificial joints  Jan. 8, 2019:  Collin [name] is seen back for follow-up visit for his coronary artery disease and diabetes mellitus.   He walks on the treadmill 4-5 times a week for 30 minutes at a time.  He denies any angina while walking the treadmill.  He still bowls once or twice a week.  He notes that his blood pressure has been somewhat variable.  It seems to go up in the evenings.  Eats prepared meals every meal.      July 24, 2018:    Collin [name] is seen today for follow up of his CAD Has pain in his calves with walking -  Resolves with walking  Had a [facility] screening of PAD screening  No CP  Still works out on the treadmill, bowl one a week   Recent lab work shows that his triglyceride level  is markedly elevated at 613.  Total cholesterol is 179.  His LDL is 77. Eats lots of cereal, through the day .     January 21, 2019:  Strained his chest recently while lifting a large box Has had some shortness of breath  Saw his primary   Echo shows a mildly depressed left ventricular systolic function with an ejection fraction of 45 to 50%.  There is hypokinesis of the inferior wall.  He has grade 1 diastolic dysfunction.  He has mild mitral regurgitation. The pain was clearly MSK.    Feeling better.   Breathing seems to be better .   November 20, 2019:   Collin [name] is seen today for a follow-up visit. He was recently seen by [name], PA with symptoms consistent with unstable angina.  He wanted to discuss it further and is seeing me today for further evaluation.  Pain / heaviness  - center of chest  Worse with walking or carrying groceries from car Had a stent in 2005.  This current pain is similar to his angina .  No diaphoresis,  No syncope.  No presyncope  Last for several minutes.  Or until he stops to rest  Was raking leaves for 20 min and had angina   January 22, 2020:  Collin [name] seen back for a follow-up visit.  He was complaining of angina when I saw him back in November, 2020. He was found to have significant disease in his right coronary artery and circumflex artery.  He had a stent placed in his mid right coronary artery as well as the posterior descending artery.  He also had a stent placed in the mid circumflex artery. The left anterior descending artery has a moderate to severe stenosis that has been treated medically. He is greatly improved.  The chest pressure has resolved.  Has not had to take any NTG. Still has some dyspnea.  Walks on the treadmill without any issues.   Was not able to do cardiac rehab due to covid.   He saw Richardson Dopp, PA on January 4.  His renal function was fairly stable.  Creatinine was 1.72.  Potassium is 4.5.  Total cholesterol is 118.   HDL is 42.  Triglyceride levels 223.  The LDL is 41.   July 27, 2020:  Collin [name] is seen today for follow-up of his coronary artery disease.  He has had a stent placed in his right coronary artery as well as the posterior descending artery.  He also has a stent in his left circumflex artery. Looks great for 89.   Has been  out bowling this am.   Is very active  Breathing is good Would like to try a glass of wine at night - I gave the [facility]    Jan. 31, 2022: Collin [name] is seen for follow up of his CAD, Still using his treadmill,  Goes bowling  Has some chest soreness if he walks for a long time  Might last 1-2 minutes.   Has not worsened in a long time  We discussed symptoms that would likely warrant a close look ( pain lasting lnger, occurring sooner, more severe pain )   His trigs are elevated.   Will start fenofibrate 48 mg a day   LDL looks good   Past Medical History:  Diagnosis Date  . Allergy   . Anemia   . Arthritis    gout  . Bladder cancer ([place])   . BPH (benign prostatic hyperplasia)   . CAD (coronary artery disease) 2005   a. diaphragmatic MI s/p DES to RCA 2005, residual disease. b. [place] 11/2019 s/p DES to PDA, mRCA, mid Cx.  . Cancer ([place]) 02/28/2001   Left ureter cancer/bladder  . Carcinoma, renal cell ([place])   . Cardiomyopathy ([place])    a. EF 45-50% by echo 12/2018.  Marland Kitchen Carotid artery disease ([place])    Doppler, September, 2010, normal, no carotid artery disease  . CKD (chronic kidney disease), stage III ([place])   . COPD (chronic obstructive pulmonary disease) ([place])   . Dysfunction of eustachian tube   . Ejection fraction    EF 55%, echo, January, 2009  . Encounter for long-term (current) use of other medications   . Enlarged prostate   . GERD (gastroesophageal reflux disease)   . Gynecomastia    b/l  . HTN (hypertension)   . Hx of cystoscopy 11/02/12   negative  . Hx of skin cancer, basal cell   . Hypercholesterolemia   . Hyperlipidemia   . Low back pain syndrome    . Myocardial infarction ([place])    hx acute  . Nocturia   . Organic impotence   .  Pulmonary nodule    Chest CT, followed  . Reactive airway disease   . 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 Staph infection 04/1997  . Thrombocytopenia ([place])     Past Surgical History:  Procedure Laterality Date  . BACK SURGERY  02/1996  . bladder tumors  2003, 2004, 01/2009   removed  . CARDIAC CATHETERIZATION    . CHOLECYSTECTOMY  07/2003  . 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 CORONARY STENT INTERVENTION  11/28/2019  . CORONARY STENT INTERVENTION N/A 11/28/2019   Procedure: CORONARY STENT INTERVENTION;  Surgeon: Burnell Blanks, MD;  Location: [facility];  Service: Cardiovascular;  Laterality: N/A;  . LEFT HEART CATH AND CORONARY ANGIOGRAPHY N/A 11/27/2019   Procedure: LEFT HEART CATH AND CORONARY ANGIOGRAPHY;  Surgeon: Burnell Blanks, MD;  Location: [facility];  Service: Cardiovascular;  Laterality: N/A;  . NEPHRECTOMY  02/2001   left kidney, ureter  . osteomyelitis     staph aureus  . URETERECTOMY  2003   for cancer    Patient Active Problem List   Diagnosis Date Noted  . CAD (coronary artery disease)     Priority: High  . Essential hypertension 04/23/2008    Priority: High  . Dyslipidemia 01/25/2007    Priority: High  . Unstable angina ([place])   . Shortness of breath 11/01/2019  . Chest pressure 11/01/2019  . Thrombocytopenia ([place])   . Skin cancer   . Pulmonary nodule   . Nocturia   . Myocardial infarction ([place])   . Low back pain syndrome   . COPD (chronic obstructive pulmonary disease) ([place])   . BPH (benign prostatic hyperplasia)   . Bladder cancer ([place])   . Arthritis   . CKD (chronic kidney disease), stage III 07/10/2017  . Diabetes ([place]) 04/15/2017  . Fatty liver 06/18/2016  . Rash and nonspecific skin eruption 06/13/2016  . H/O bacterial endocarditis 04/06/2016  .  Myalgia and myositis 12/18/2015  . Abnormal CT of the abdomen 12/31/2013  . Abnormal chest x-ray 12/31/2013  . Gynecomastia   . Spinal stenosis, lumbar 05/12/2012  . Carotid artery disease ([place])   . CERVICAL RADICULOPATHY, LEFT 12/07/2009  . SKIN CANCER, HX OF 12/07/2009  . Renal cell carcinoma ([place]) 07/18/2009  . ANEMIA, 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  . GERD 07/18/2009  . Cancer ([place]) 02/28/2001      Current Outpatient Medications  Medication Sig Dispense Refill  . acetaminophen (TYLENOL) 500 MG tablet Take 500 mg by mouth every 6 (six) hours as needed for moderate pain or headache.     . alfuzosin (UROXATRAL) 10 MG 24 hr tablet Take 10 mg by mouth daily.     Marland Kitchen aspirin EC 81 MG tablet Take 1 tablet (81 mg total) by mouth daily.    . carvedilol (COREG) 3.125 MG tablet Take 1 tablet (3.125 mg total) by mouth 2 (two) times daily. 180 tablet 2  . clopidogrel (PLAVIX) 75 MG tablet Take 1 tablet (75 mg total) by mouth daily. 90 tablet 3  . fenofibrate (TRICOR) 48 MG tablet Take 1 tablet (48 mg total) by mouth daily. 90 tablet 3  . finasteride (PROSCAR) 5 MG tablet Take 5 mg by mouth daily.    Marland Kitchen losartan (COZAAR) 50 MG tablet Take 0.5 tablets (25 mg total) by mouth 2 (two) times daily. 90 tablet 3  . metroNIDAZOLE (METROGEL) 0.75 % gel Apply topically  2 (two) times daily.    . Multiple Vitamin (MULTIVITAMIN) tablet Take 1 tablet by mouth daily.    . rosuvastatin (CRESTOR) 5 MG tablet Take 1 tablet (5 mg total) by mouth at bedtime. 90 tablet 2  . sodium chloride (OCEAN) 0.65 % SOLN nasal spray Place 1 spray into both nostrils as needed for congestion.    . triamcinolone ointment (KENALOG) 0.1 % Apply 1 application topically daily.     . nitroGLYCERIN (NITROSTAT) 0.4 MG SL tablet Place 1 tablet (0.4 mg total) under the tongue every 5 (five) minutes as needed for chest pain. 25 tablet 6   No current facility-administered medications for this visit.    Allergies:   Tessalon perles [benzonatate],  Atorvastatin, Peanut oil, and Trandolapril    Social History:  The patient  reports that he quit smoking about 29 years ago. His smoking use included pipe. He has quit using smokeless tobacco. He reports that he does not drink alcohol and does not use drugs.   Family History:  The patient's family history includes Coronary artery disease in his brother; Diabetes in his brother, mother, sister, and sister; Drug abuse in his brother; Heart failure (age of onset: 64) in his mother.    ROS:  Please see the history of present illness.  Patient denies fever, chills, headache, sweats, rash, change in vision, change in hearing, chest pain, cough, nausea or vomiting, urinary symptoms. All other systems are reviewed and are negative.      Physical Exam: Blood pressure (!) 112/58, pulse (!) 58, height 5\' 7"  (1.702 m), weight 169 lb (76.7 kg), SpO2 96 %.  GEN:  Well nourished, well developed in no acute distress HEENT: Normal NECK: No JVD; No carotid bruits LYMPHATICS: No lymphadenopathy CARDIAC: RRR , no murmurs, rubs, gallops RESPIRATORY:  Clear to auscultation without rales, wheezing or rhonchi  ABDOMEN: Soft, non-tender, non-distended MUSCULOSKELETAL:  No edema; No deformity  SKIN: Warm and dry NEUROLOGIC:  Alert and oriented x 3   EKG:     Recent Labs: 07/17/2020: Hemoglobin 13.4; Platelets 116 01/18/2021: ALT 23; BUN 23; Creatinine, Ser 1.48; Potassium 4.5; Sodium 140    Lipid Panel    Component Value Date/Time   CHOL 139 01/18/2021 0940   TRIG 301 (H) 01/18/2021 0940   HDL 39 (L) 01/18/2021 0940   CHOLHDL 3.6 01/18/2021 0940   CHOLHDL 4 07/08/2019 1421   VLDL 42.0 (H) 07/08/2019 1421   LDLCALC 53 01/18/2021 0940   LDLDIRECT 107.0 07/08/2019 1421      Wt Readings from Last 3 Encounters:  01/25/21 169 lb (76.7 kg)  01/22/21 165 lb 12.8 oz (75.2 kg)  12/23/20 170 lb (77.1 kg)      Current medicines are reviewed. The patient has a very good understanding of his  medications. No changes are to be made.       ASSESSMENT AND PLAN:  1. CAD-does not have any real chest pain.  He has some occasional episodes of chest soreness after walking on the treadmill.  He states that this pain has been stable for quite a long time.  It is not accelerated.  I have asked him to let us know if this discomfort worsens in any way.  For now we will continue current medications.   2. Essential HTN-blood pressure is well controlled.   3. Hyperlipidemia -   triglyceride levels are elevated.  His cholesterol levels are well controlled. We will start him on fenofibrate 48 mg a day.  We  are giving him the lower dose because of his CKD.  4.  Chronic kidney disease: His creatinine is around 2.4.  He will continue to follow with his primary medical doctor. Mertie Moores, MD  01/25/2021 10:58 AM    [address],  [address] [place], [place]  [id number] Pager [phone] Phone: [phone]; Fax: [phone]

## 2021-01-25 ENCOUNTER — Ambulatory Visit (INDEPENDENT_AMBULATORY_CARE_PROVIDER_SITE_OTHER): Payer: Medicare Other | Admitting: Cardiovascular Disease

## 2021-01-25 ENCOUNTER — Other Ambulatory Visit: Payer: Self-pay

## 2021-01-25 ENCOUNTER — Encounter: Payer: Self-pay | Admitting: Cardiovascular Disease

## 2021-01-25 VITALS — BP 112/58 | HR 58 | Ht 67.0 in | Wt 169.0 lb

## 2021-01-25 DIAGNOSIS — I10 Essential (primary) hypertension: Secondary | ICD-10-CM | POA: Diagnosis not present

## 2021-01-25 DIAGNOSIS — I2510 Atherosclerotic heart disease of native coronary artery without angina pectoris: Secondary | ICD-10-CM

## 2021-01-25 MED ORDER — FENOFIBRATE 48 MG PO TABS: 48.0000 mg | ORAL_TABLET | Freq: Every day | ORAL | 3 refills | Status: DC

## 2021-01-25 MED ORDER — NITROGLYCERIN 0.4 MG SL SUBL: 0.4000 mg | SUBLINGUAL_TABLET | SUBLINGUAL | 6 refills | Status: AC | PRN

## 2021-01-25 NOTE — Patient Instructions (Signed)
Medication Instructions:  Your physician has recommended you make the following change in your medication:  START FENOFIBRATE (TRICOR) 48MG  ONCE A DAY  *If you need a refill on your cardiac medications before your next appointment, please call your pharmacy*   Lab Work: None today If you have labs (blood work) drawn today and your tests are completely normal, you will receive your results only by: Marland Kitchen MyChart Message (if you have MyChart) OR . A paper copy in the mail If you have any lab test that is abnormal or we need to change your treatment, we will call you to review the results.   Testing/Procedures: None ordered   Follow-Up: At [facility], you and your health needs are our priority.  As part of our continuing mission to provide you with exceptional heart care, we have created designated Provider Care Teams.  These Care Teams include your primary Cardiologist (physician) and Advanced Practice Providers (APPs -  Physician Assistants and Nurse Practitioners) who all work together to provide you with the care you need, when you need it.  Your next appointment:   1 year(s)  The format for your next appointment:   In Person  Provider:   You may see Mertie Moores, MD or one of the following Advanced Practice Providers on your designated Care Team:    Richardson Dopp, PA-C  [place], [place]

## 2021-02-07 ENCOUNTER — Other Ambulatory Visit: Payer: Self-pay | Admitting: Cardiovascular Disease

## 2021-04-02 ENCOUNTER — Encounter: Payer: Self-pay | Admitting: Podiatry

## 2021-04-02 ENCOUNTER — Ambulatory Visit (INDEPENDENT_AMBULATORY_CARE_PROVIDER_SITE_OTHER): Payer: Medicare Other | Admitting: Podiatry

## 2021-04-02 ENCOUNTER — Other Ambulatory Visit: Payer: Self-pay

## 2021-04-02 DIAGNOSIS — M79675 Pain in left toe(s): Secondary | ICD-10-CM

## 2021-04-02 DIAGNOSIS — E119 Type 2 diabetes mellitus without complications: Secondary | ICD-10-CM | POA: Diagnosis not present

## 2021-04-02 DIAGNOSIS — B351 Tinea unguium: Secondary | ICD-10-CM

## 2021-04-02 DIAGNOSIS — M79674 Pain in right toe(s): Secondary | ICD-10-CM

## 2021-04-02 DIAGNOSIS — D689 Coagulation defect, unspecified: Secondary | ICD-10-CM

## 2021-04-02 DIAGNOSIS — N1832 Chronic kidney disease, stage 3b: Secondary | ICD-10-CM

## 2021-04-02 NOTE — Progress Notes (Signed)
This patient returns to my office for at risk foot care.  This patient requires this care by a professional since this patient will be at risk due to having thrombocytopenia, CKD, and diabetes.  This patient is unable to cut nails himself since the patient cannot reach his nails.These nails are painful walking and wearing shoes.  This patient presents for at risk foot care today. . Patient is taking plavix.  General Appearance  Alert, conversant and in no acute stress.  Vascular  Dorsalis pedis and posterior tibial  pulses are palpable  bilaterally.  Capillary return is within normal limits  bilaterally. Temperature is within normal limits  bilaterally.  Neurologic  Senn-Weinstein monofilament wire test within normal limits  bilaterally. Muscle power within normal limits bilaterally.  Nails Thick disfigured discolored nails with subungual debris  from hallux to fifth toes bilaterally. No evidence of bacterial infection or drainage bilaterally.  Orthopedic  No limitations of motion  feet .  No crepitus or effusions noted.  No bony pathology or digital deformities noted.  HAV  B/L.  Skin  normotropic skin with no porokeratosis noted bilaterally.  No signs of infections or ulcers noted.     Onychomycosis  Pain in right toes  Pain in left toes  Consent was obtained for treatment procedures.   Mechanical debridement of nails 1-5  bilaterally performed with a nail nipper.  Filed with dremel without incident.    Return office visit    3 months                  Told patient to return for periodic foot care and evaluation due to potential at risk complications.   Jhayden Demuro DPM  

## 2021-07-07 ENCOUNTER — Ambulatory Visit (INDEPENDENT_AMBULATORY_CARE_PROVIDER_SITE_OTHER): Payer: Medicare Other | Admitting: Podiatry

## 2021-07-07 ENCOUNTER — Encounter: Payer: Self-pay | Admitting: Podiatry

## 2021-07-07 ENCOUNTER — Other Ambulatory Visit: Payer: Self-pay

## 2021-07-07 DIAGNOSIS — N1832 Chronic kidney disease, stage 3b: Secondary | ICD-10-CM

## 2021-07-07 DIAGNOSIS — D689 Coagulation defect, unspecified: Secondary | ICD-10-CM

## 2021-07-07 DIAGNOSIS — M79674 Pain in right toe(s): Secondary | ICD-10-CM

## 2021-07-07 DIAGNOSIS — B351 Tinea unguium: Secondary | ICD-10-CM | POA: Diagnosis not present

## 2021-07-07 DIAGNOSIS — M79675 Pain in left toe(s): Secondary | ICD-10-CM

## 2021-07-07 DIAGNOSIS — E119 Type 2 diabetes mellitus without complications: Secondary | ICD-10-CM

## 2021-07-07 NOTE — Progress Notes (Signed)
This patient returns to my office for at risk foot care.  This patient requires this care by a professional since this patient will be at risk due to having thrombocytopenia, CKD, and diabetes.  This patient is unable to cut nails himself since the patient cannot reach his nails.These nails are painful walking and wearing shoes.  This patient presents for at risk foot care today. . Patient is taking plavix.  General Appearance  Alert, conversant and in no acute stress.  Vascular  Dorsalis pedis and posterior tibial  pulses are palpable  bilaterally.  Capillary return is within normal limits  bilaterally. Temperature is within normal limits  bilaterally.  Neurologic  Senn-Weinstein monofilament wire test within normal limits  bilaterally. Muscle power within normal limits bilaterally.  Nails Thick disfigured discolored nails with subungual debris  from hallux to fifth toes bilaterally. No evidence of bacterial infection or drainage bilaterally.  Orthopedic  No limitations of motion  feet .  No crepitus or effusions noted.  No bony pathology or digital deformities noted.  HAV  B/L.  Skin  normotropic skin with no porokeratosis noted bilaterally.  No signs of infections or ulcers noted.     Onychomycosis  Pain in right toes  Pain in left toes  Consent was obtained for treatment procedures.   Mechanical debridement of nails 1-5  bilaterally performed with a nail nipper.  Filed with dremel without incident.    Return office visit    3 months                  Told patient to return for periodic foot care and evaluation due to potential at risk complications.   Anniya Whiters DPM  

## 2021-07-21 ENCOUNTER — Other Ambulatory Visit: Payer: Self-pay | Admitting: Cardiovascular Disease

## 2021-07-22 NOTE — Progress Notes (Signed)
Subjective:    Patient ID: Collin [name], male    DOB: March 29, 1925, 87 y.o.   MRN: [id number]  HPI The patient is here for follow up of their chronic medical problems, including CAD s/p PCI x 3, htn, DM, hichol, CKD  Getting more sleep - 12 am - 7 am, occ naps during day, but only for 5-10 minutes.  He goes bowling a couple times a week.  He does yard work and house work. Sometimes walks on treadmill  Medications and allergies reviewed with patient and updated if appropriate.  Patient Active Problem List   Diagnosis Date Noted   Unstable angina ([place])    Shortness of breath 11/01/2019   Chest pressure 11/01/2019   Thrombocytopenia (HCC)    Skin cancer    Pulmonary nodule    Nocturia    Myocardial infarction (HCC)    Low back pain syndrome    COPD (chronic obstructive pulmonary disease) (HCC)    BPH (benign prostatic hyperplasia)    Bladder cancer (HCC)    Arthritis    CKD (chronic kidney disease), stage III 07/10/2017   Diabetes ([place]) 04/15/2017   Fatty liver 06/18/2016   Rash and nonspecific skin eruption 06/13/2016   H/O bacterial endocarditis 04/06/2016   Myalgia and myositis 12/18/2015   Abnormal CT of the abdomen 12/31/2013   Abnormal chest x-ray 12/31/2013   Gynecomastia    Spinal stenosis, lumbar 05/12/2012   CAD (coronary artery disease)    Carotid artery disease ([place])    CERVICAL RADICULOPATHY, LEFT 12/07/2009   SKIN CANCER, HX OF 12/07/2009   Renal cell carcinoma ([place]) 07/18/2009   ANEMIA, 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   GERD 07/18/2009   Essential hypertension 04/23/2008   Dyslipidemia 01/25/2007   Cancer ([place]) 02/28/2001    Current Outpatient Medications on File Prior to Visit  Medication Sig Dispense Refill   acetaminophen (TYLENOL) 500 MG tablet Take 500 mg by mouth every 6 (six) hours as needed for moderate pain or headache.      alfuzosin (UROXATRAL) 10 MG 24 hr tablet Take 10 mg by mouth daily.      aspirin EC 81 MG tablet Take 1 tablet (81 mg total)  by mouth daily.     carvedilol (COREG) 3.125 MG tablet TAKE 1 TABLET TWICE A DAY 180 tablet 2   clopidogrel (PLAVIX) 75 MG tablet TAKE 1 TABLET DAILY 90 tablet 3   fenofibrate (TRICOR) 48 MG tablet Take 1 tablet (48 mg total) by mouth daily. 90 tablet 3   finasteride (PROSCAR) 5 MG tablet Take 5 mg by mouth daily.     fluocinonide ointment (LIDEX) 0.05 % SMARTSIG:1 Sparingly Topical Twice Daily     losartan (COZAAR) 50 MG tablet TAKE 1/2 TABLET (25MG       TOTAL) TWICE A DAY 90 tablet 3   metroNIDAZOLE (METROGEL) 0.75 % gel Apply topically 2 (two) times daily.     Multiple Vitamin (MULTIVITAMIN) tablet Take 1 tablet by mouth daily.     nitroGLYCERIN (NITROSTAT) 0.4 MG SL tablet Place 1 tablet (0.4 mg total) under the tongue every 5 (five) minutes as needed for chest pain. 25 tablet 6   rosuvastatin (CRESTOR) 5 MG tablet TAKE 1 TABLET AT BEDTIME 90 tablet 2   sodium chloride (OCEAN) 0.65 % SOLN nasal spray Place 1 spray into both nostrils as needed for congestion.     triamcinolone ointment (KENALOG) 0.1 % Apply 1 application topically daily.      No current facility-administered medications on  file prior to visit.    Past Medical History:  Diagnosis Date   Allergy    Anemia    Arthritis    gout   Bladder cancer ([place])    BPH (benign prostatic hyperplasia)    CAD (coronary artery disease) 2005   a. diaphragmatic MI s/p DES to RCA 2005, residual disease. b. [place] 11/2019 s/p DES to PDA, mRCA, mid Cx.   Cancer ([place]) 02/28/2001   Left ureter cancer/bladder   Carcinoma, renal cell ([place])    Cardiomyopathy ([place])    a. EF 45-50% by echo 12/2018.   Carotid artery disease ([place])    Doppler, September, 2010, normal, no carotid artery disease   CKD (chronic kidney disease), stage III (HCC)    COPD (chronic obstructive pulmonary disease) ([place])    Dysfunction of eustachian tube    Ejection fraction    EF 55%, echo, January, 2009   Encounter for long-term (current) use of other medications     Enlarged prostate    GERD (gastroesophageal reflux disease)    Gynecomastia    b/l   HTN (hypertension)    Hx of cystoscopy 11/02/12   negative   Hx of skin cancer, basal cell    Hypercholesterolemia    Hyperlipidemia    Low back pain syndrome    Myocardial infarction ([place])    hx acute   Nocturia    Organic impotence    Pulmonary nodule    Chest CT, followed   Reactive airway disease    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   Staph infection 04/1997   Thrombocytopenia [facility])     Past Surgical History:  Procedure Laterality Date   BACK SURGERY  02/1996   bladder tumors  2003, 2004, 01/2009   removed   CARDIAC CATHETERIZATION     CHOLECYSTECTOMY  07/2003   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   sutured, right   CORNEAL TRANSPLANT  10/2008   left   CORONARY STENT INTERVENTION  11/28/2019   CORONARY STENT INTERVENTION N/A 11/28/2019   Procedure: CORONARY STENT INTERVENTION;  Surgeon: Burnell Blanks, MD;  Location: [facility];  Service: Cardiovascular;  Laterality: N/A;   LEFT HEART CATH AND CORONARY ANGIOGRAPHY N/A 11/27/2019   Procedure: LEFT HEART CATH AND CORONARY ANGIOGRAPHY;  Surgeon: Burnell Blanks, MD;  Location: [facility];  Service: Cardiovascular;  Laterality: N/A;   NEPHRECTOMY  02/2001   left kidney, ureter   osteomyelitis     staph aureus   URETERECTOMY  2003   for cancer    Social History   Socioeconomic History   Marital status: Widowed    Spouse name: Not on file   Number of children: 1   Years of education: Not on file   Highest education level: Not on file  Occupational History   Occupation: retired    Comment: [employer]  Tobacco Use   Smoking status: Former    Types: Pipe    Quit date: 12/27/1991    Years since quitting: 29.5   Smokeless tobacco: Former   Tobacco comments:    Quit 20-30 years ago as of 2012  Vaping Use   Vaping Use: Never used  Substance and Sexual Activity   Alcohol use: No     Alcohol/week: 0.0 standard drinks   Drug use: No   Sexual activity: Not Currently  Other Topics Concern   Not on file  Social History Narrative   30 minutes a day  for 5 days a week   Social Determinants of Health   Financial Resource Strain: Low Risk    Difficulty of Paying Living Expenses: Not hard at all  Food Insecurity: No Food Insecurity   Worried About Charity fundraiser in the Last Year: Never true   Arboriculturist in the Last Year: Never true  Transportation Needs: No Transportation Needs   Lack of Transportation (Medical): No   Lack of Transportation (Non-Medical): No  Physical Activity: Sufficiently Active   Days of Exercise per Week: 5 days   Minutes of Exercise per Session: 30 min  Stress: No Stress Concern Present   Feeling of Stress : Not at all  Social Connections: Moderately Integrated   Frequency of Communication with Friends and Family: Twice a week   Frequency of Social Gatherings with Friends and Family: Once a week   Attends Religious Services: More than 4 times per year   Active Member of [employer] or Organizations: Yes   Attends Archivist Meetings: More than 4 times per year   Marital Status: Widowed    Family History  Problem Relation Age of Onset   Heart failure Mother 67   Diabetes Mother    Coronary artery disease Brother    Diabetes Brother    Drug abuse Brother    Diabetes Sister    Diabetes Sister     Review of Systems  Constitutional:  Negative for fever.  Respiratory:  Positive for shortness of breath (chronic - no change). Negative for cough and wheezing.   Cardiovascular:  Negative for chest pain, palpitations and leg swelling.  Musculoskeletal:  Positive for back pain.       Lower leg pain achiness when walking - better with rest  Neurological:  Negative for dizziness, light-headedness and headaches.      Objective:   Vitals:   07/23/21 1047  BP: 116/70  Pulse: 61  Temp: 98.6 F (37 C)  SpO2: 96%   BP  Readings from Last 3 Encounters:  07/23/21 116/70  01/25/21 (!) 112/58  01/22/21 118/72   Wt Readings from Last 3 Encounters:  07/23/21 171 lb (77.6 kg)  01/25/21 169 lb (76.7 kg)  01/22/21 165 lb 12.8 oz (75.2 kg)   Body mass index is 26.78 kg/m.   Physical Exam    Constitutional: Appears well-developed and well-nourished. No distress.  HENT:  Head: Normocephalic and atraumatic.  Neck: Neck supple. No tracheal deviation present. No thyromegaly present.  No cervical lymphadenopathy Cardiovascular: Normal rate, regular rhythm and normal heart sounds.   No murmur heard. No carotid bruit .  No edema Pulmonary/Chest: Effort normal and breath sounds normal. No respiratory distress. No has no wheezes. No rales.  Skin: Skin is warm and dry. Not diaphoretic.  Psychiatric: Normal mood and affect. Behavior is normal.      Assessment & Plan:    See Problem List for Assessment and Plan of chronic medical problems.    This visit occurred during the SARS-CoV-2 public health emergency.  Safety protocols were in place, including screening questions prior to the visit, additional usage of staff PPE, and extensive cleaning of exam room while observing appropriate contact time as indicated for disinfecting solutions.

## 2021-07-22 NOTE — Patient Instructions (Addendum)
    Blood work was ordered.      Medications changes include :   none     Please followup in 6 months  

## 2021-07-23 ENCOUNTER — Encounter: Payer: Self-pay | Admitting: Internal Medicine

## 2021-07-23 ENCOUNTER — Ambulatory Visit (INDEPENDENT_AMBULATORY_CARE_PROVIDER_SITE_OTHER): Payer: Medicare Other | Admitting: Internal Medicine

## 2021-07-23 ENCOUNTER — Other Ambulatory Visit: Payer: Self-pay

## 2021-07-23 VITALS — BP 116/70 | HR 61 | Temp 98.6°F | Ht 67.0 in | Wt 171.0 lb

## 2021-07-23 DIAGNOSIS — N1832 Chronic kidney disease, stage 3b: Secondary | ICD-10-CM

## 2021-07-23 DIAGNOSIS — I10 Essential (primary) hypertension: Secondary | ICD-10-CM | POA: Diagnosis not present

## 2021-07-23 DIAGNOSIS — E1122 Type 2 diabetes mellitus with diabetic chronic kidney disease: Secondary | ICD-10-CM | POA: Diagnosis not present

## 2021-07-23 DIAGNOSIS — N1831 Chronic kidney disease, stage 3a: Secondary | ICD-10-CM

## 2021-07-23 DIAGNOSIS — I2510 Atherosclerotic heart disease of native coronary artery without angina pectoris: Secondary | ICD-10-CM

## 2021-07-23 DIAGNOSIS — E785 Hyperlipidemia, unspecified: Secondary | ICD-10-CM

## 2021-07-23 LAB — COMPREHENSIVE METABOLIC PANEL
ALT: 16 U/L (ref 0–53)
AST: 17 U/L (ref 0–37)
Albumin: 4.4 g/dL (ref 3.5–5.2)
Alkaline Phosphatase: 45 U/L (ref 39–117)
BUN: 25 mg/dL — ABNORMAL HIGH (ref 6–23)
CO2: 30 mEq/L (ref 19–32)
Calcium: 8.9 mg/dL (ref 8.4–10.5)
Chloride: 104 mEq/L (ref 96–112)
Creatinine, Ser: 1.76 mg/dL — ABNORMAL HIGH (ref 0.40–1.50)
GFR: 32.79 mL/min — ABNORMAL LOW (ref >60.00)
Glucose, Bld: 154 mg/dL — ABNORMAL HIGH (ref 70–99)
Potassium: 4.7 mEq/L (ref 3.5–5.1)
Sodium: 139 mEq/L (ref 135–145)
Total Bilirubin: 0.9 mg/dL (ref 0.2–1.2)
Total Protein: 6.3 g/dL (ref 6.0–8.3)

## 2021-07-23 LAB — CBC WITH DIFFERENTIAL/PLATELET
Basophils Absolute: 0 10*3/uL (ref 0.0–0.1)
Basophils Relative: 0.8 % (ref 0.0–3.0)
Eosinophils Absolute: 0.2 10*3/uL (ref 0.0–0.7)
Eosinophils Relative: 3 % (ref 0.0–5.0)
HCT: 39.2 % (ref 39.0–52.0)
Hemoglobin: 13.3 g/dL (ref 13.0–17.0)
Lymphocytes Relative: 28.8 % (ref 12.0–46.0)
Lymphs Abs: 1.4 10*3/uL (ref 0.7–4.0)
MCHC: 33.9 g/dL (ref 30.0–36.0)
MCV: 93.9 fl (ref 78.0–100.0)
Monocytes Absolute: 0.6 10*3/uL (ref 0.1–1.0)
Monocytes Relative: 12.8 % — ABNORMAL HIGH (ref 3.0–12.0)
Neutro Abs: 2.7 10*3/uL (ref 1.4–7.7)
Neutrophils Relative %: 54.6 % (ref 43.0–77.0)
Platelets: 110 10*3/uL — ABNORMAL LOW (ref 150.0–400.0)
RBC: 4.18 Mil/uL — ABNORMAL LOW (ref 4.22–5.81)
RDW: 13.4 % (ref 11.5–15.5)
WBC: 5 10*3/uL (ref 4.0–10.5)

## 2021-07-23 LAB — LIPID PANEL
Cholesterol: 129 mg/dL (ref 0–200)
HDL: 39.4 mg/dL (ref >39.00)
NonHDL: 89.1
Total CHOL/HDL Ratio: 3
Triglycerides: 219 mg/dL — ABNORMAL HIGH (ref 0.0–149.0)
VLDL: 43.8 mg/dL — ABNORMAL HIGH (ref 0.0–40.0)

## 2021-07-23 LAB — HEMOGLOBIN A1C: Hgb A1c MFr Bld: 6.4 % (ref 4.6–6.5)

## 2021-07-23 LAB — LDL CHOLESTEROL, DIRECT: Direct LDL: 60 mg/dL

## 2021-07-23 NOTE — Assessment & Plan Note (Signed)
Chronic Diet controlled Check A1c Continue healthy diet and regular activity

## 2021-07-23 NOTE — Assessment & Plan Note (Signed)
Chronic ?Stable ?CMP ?

## 2021-07-23 NOTE — Assessment & Plan Note (Signed)
Chronic No symptoms suggestive of angina Following with cardiology Continue aspirin 81 mg daily, Coreg 3.125 mg twice daily, Plavix 75 mg daily, Tricor 48 mg daily, Crestor 5 mg daily

## 2021-07-23 NOTE — Assessment & Plan Note (Signed)
Chronic Check lipid panel  Continue Crestor 5 mg daily, Tricor 48 mg daily Regular exercise and healthy diet encouraged

## 2021-07-23 NOTE — Assessment & Plan Note (Signed)
Chronic Blood pressure well controlled Continue carvedilol 3.125 mg twice daily, losartan 25 mg twice daily CMP

## 2021-09-25 IMAGING — DX DG CHEST 2V
2 series · 2 of 2 positions shown · non-contrast
Comparison: 11/01/2019

CLINICAL DATA: Shortness of breath.  Nasal congestion.  COPD.

EXAM:
CHEST - 2 VIEW

[chest pa]
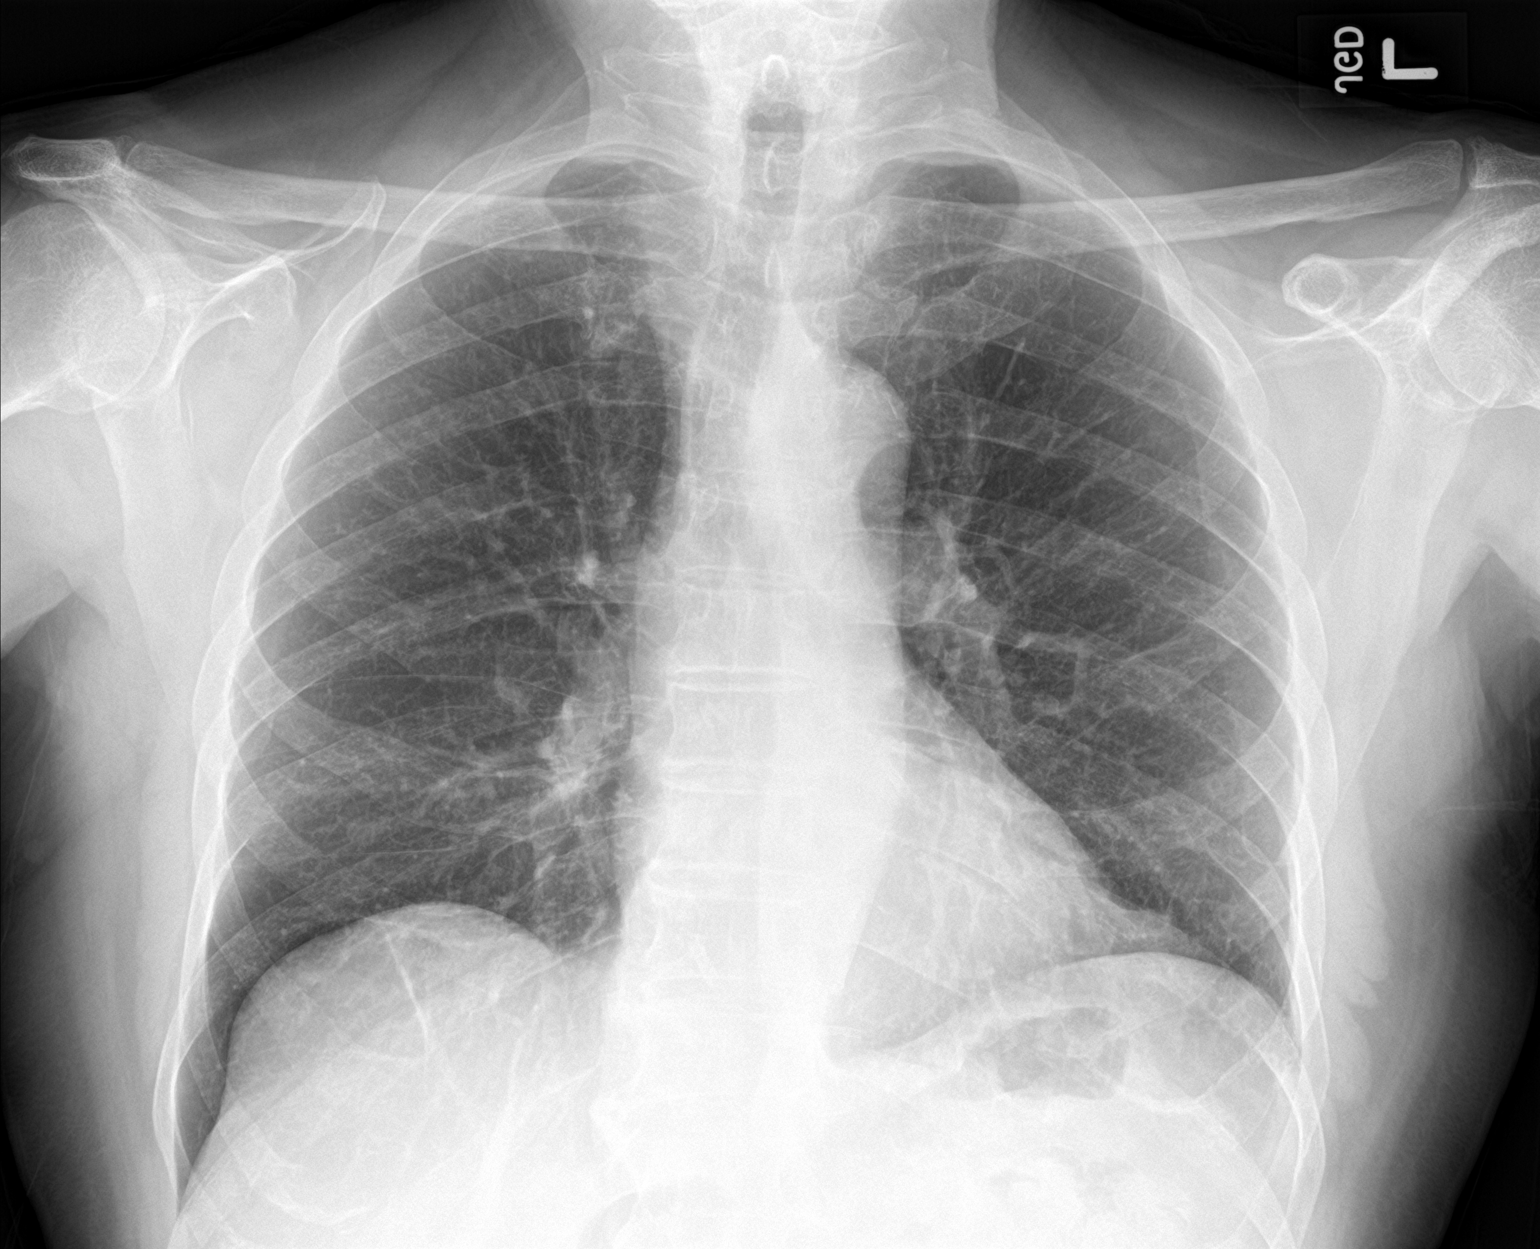

[chest lat]
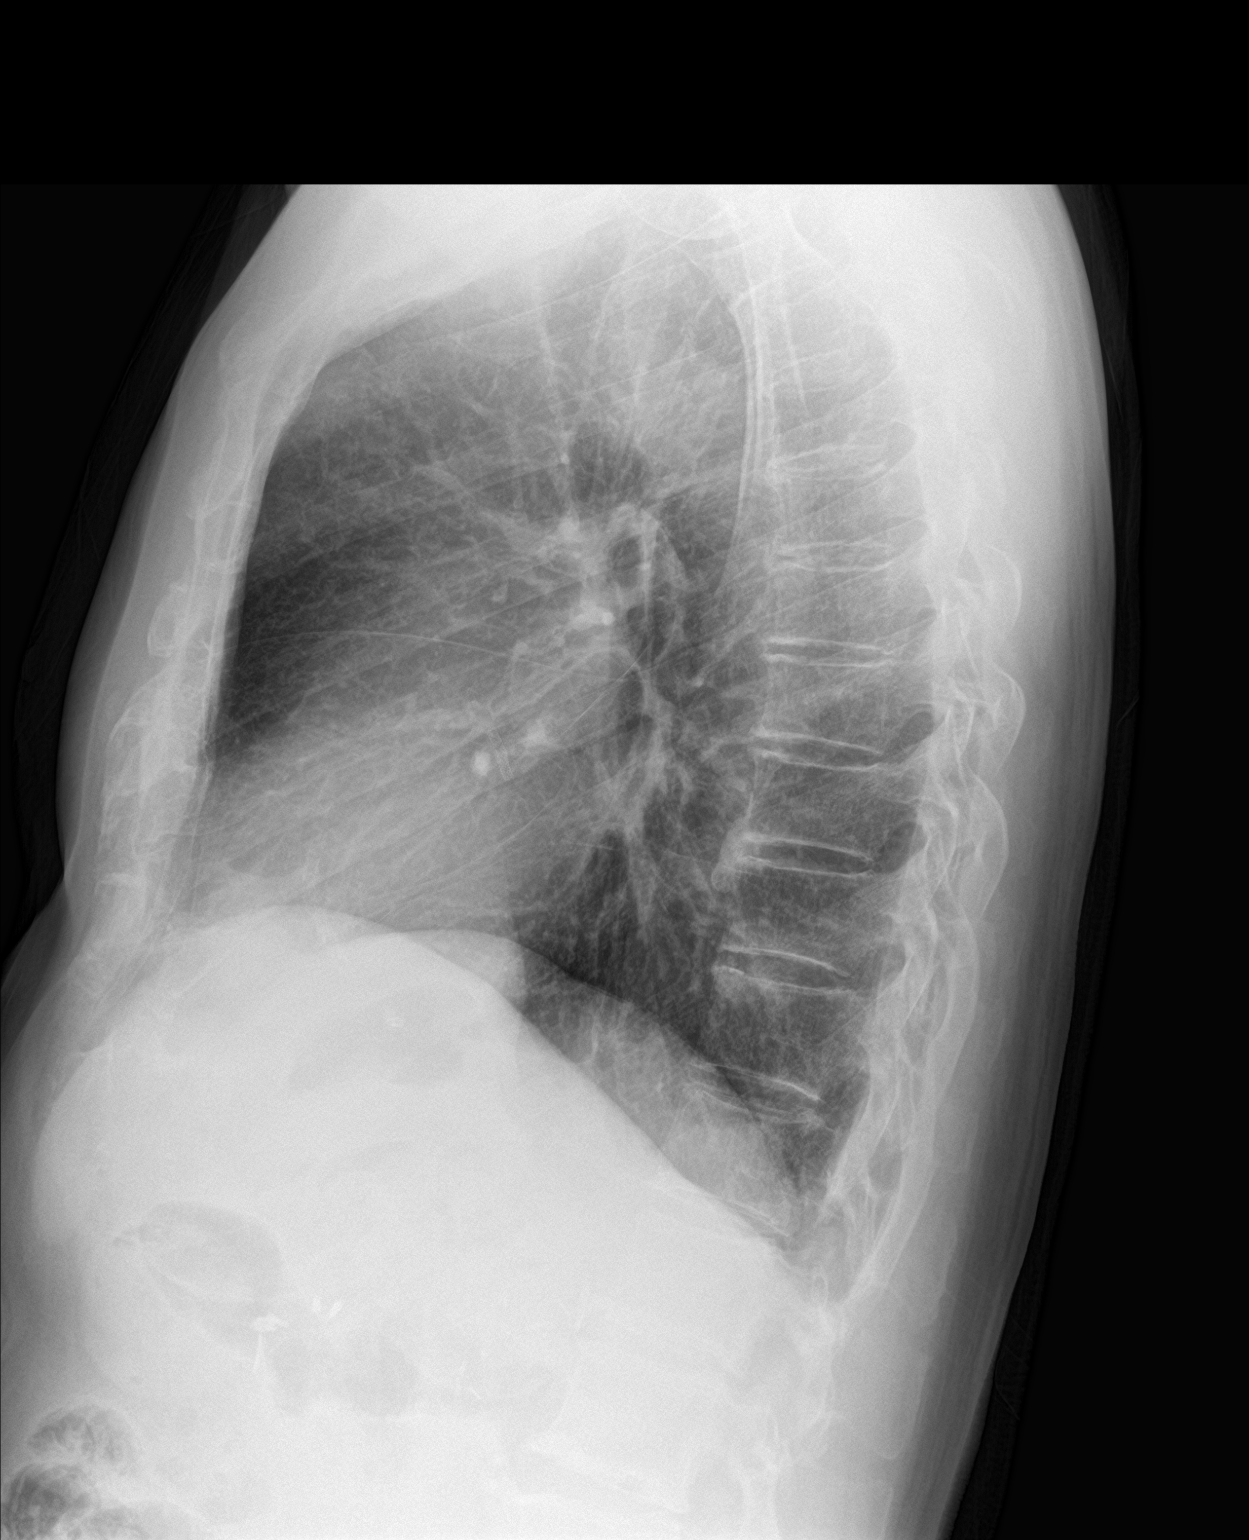

[2 of 2 positions shown; findings below may reference images not displayed]

FINDINGS: Midline trachea. Normal heart size and mediastinal contours for age.
No pleural effusion or pneumothorax. Clear lungs.
IMPRESSION: No acute cardiopulmonary disease.

## 2021-10-12 ENCOUNTER — Encounter: Payer: Self-pay | Admitting: Podiatry

## 2021-10-12 ENCOUNTER — Ambulatory Visit (INDEPENDENT_AMBULATORY_CARE_PROVIDER_SITE_OTHER): Payer: Medicare Other | Admitting: Podiatry

## 2021-10-12 ENCOUNTER — Other Ambulatory Visit: Payer: Self-pay

## 2021-10-12 DIAGNOSIS — M79674 Pain in right toe(s): Secondary | ICD-10-CM

## 2021-10-12 DIAGNOSIS — N1832 Chronic kidney disease, stage 3b: Secondary | ICD-10-CM | POA: Diagnosis not present

## 2021-10-12 DIAGNOSIS — B351 Tinea unguium: Secondary | ICD-10-CM | POA: Diagnosis not present

## 2021-10-12 DIAGNOSIS — E119 Type 2 diabetes mellitus without complications: Secondary | ICD-10-CM

## 2021-10-12 DIAGNOSIS — D689 Coagulation defect, unspecified: Secondary | ICD-10-CM

## 2021-10-12 DIAGNOSIS — M79675 Pain in left toe(s): Secondary | ICD-10-CM

## 2021-10-12 NOTE — Progress Notes (Signed)
This patient returns to my office for at risk foot care.  This patient requires this care by a professional since this patient will be at risk due to having thrombocytopenia, CKD, and diabetes.  This patient is unable to cut nails himself since the patient cannot reach his nails.These nails are painful walking and wearing shoes.  This patient presents for at risk foot care today. . Patient is taking plavix.  General Appearance  Alert, conversant and in no acute stress.  Vascular  Dorsalis pedis and posterior tibial  pulses are palpable  bilaterally.  Capillary return is within normal limits  bilaterally. Temperature is within normal limits  bilaterally.  Neurologic  Senn-Weinstein monofilament wire test within normal limits  bilaterally. Muscle power within normal limits bilaterally.  Nails Thick disfigured discolored nails with subungual debris  from hallux to fifth toes bilaterally. No evidence of bacterial infection or drainage bilaterally.  Orthopedic  No limitations of motion  feet .  No crepitus or effusions noted.  No bony pathology or digital deformities noted.  HAV  B/L.  Skin  normotropic skin with no porokeratosis noted bilaterally.  No signs of infections or ulcers noted.     Onychomycosis  Pain in right toes  Pain in left toes  Consent was obtained for treatment procedures.   Mechanical debridement of nails 1-5  bilaterally performed with a nail nipper.  Filed with dremel without incident.    Return office visit    3 months                  Told patient to return for periodic foot care and evaluation due to potential at risk complications.   Nikiesha Milford DPM  

## 2021-10-13 ENCOUNTER — Ambulatory Visit: Payer: Medicare Other | Admitting: Podiatry

## 2021-10-22 ENCOUNTER — Other Ambulatory Visit: Payer: Self-pay

## 2021-10-22 ENCOUNTER — Other Ambulatory Visit (HOSPITAL_BASED_OUTPATIENT_CLINIC_OR_DEPARTMENT_OTHER): Payer: Self-pay

## 2021-10-22 ENCOUNTER — Ambulatory Visit: Payer: Medicare Other | Attending: Internal Medicine

## 2021-10-22 DIAGNOSIS — Z23 Encounter for immunization: Secondary | ICD-10-CM

## 2021-10-22 MED ORDER — MODERNA COVID-19 BIVAL BOOSTER 50 MCG/0.5ML IM SUSP
INTRAMUSCULAR | 0 refills | Status: DC
  Filled 2021-10-22: qty 0.5, 1d supply, fill #0

## 2021-10-22 NOTE — Progress Notes (Signed)
   Covid-19 Vaccination Clinic  Name:  KYNG MATLOCK    MRN: [id number] DOB: 1925/01/21  10/22/2021  Mr. Groeneveld was observed post Covid-19 immunization for 15 minutes without incident. He was provided with Vaccine Information Sheet and instruction to access the V-Safe system.   Mr. Carneiro was instructed to call 911 with any severe reactions post vaccine: Difficulty breathing  Swelling of face and throat  A fast heartbeat  A bad rash all over body  Dizziness and weakness   Immunizations Administered     Name Date Dose VIS Date Route   Moderna Covid-19 vaccine Bivalent Booster 10/22/2021  1:11 PM 0.5 mL 08/07/2021 Intramuscular   Manufacturer: Moderna   Lot: [id number]   [place]: [id number]

## 2021-11-01 ENCOUNTER — Telehealth: Payer: Self-pay | Admitting: *Deleted

## 2021-11-01 NOTE — Chronic Care Management (AMB) (Signed)
  Chronic Care Management   Outreach Note  11/01/2021 Name: Collin [name] MRN: [id number] DOB: [birth year]  Collin [name] is a 87 y.o. year old male who is a primary care patient of Burns, Claudina Lick, MD. I reached out to Collin [name] by phone today in response to a referral sent by [name] primary care provider.  An unsuccessful telephone outreach was attempted today. The patient was referred to the case management team for assistance with care management and care coordination.   Follow Up Plan: A HIPAA compliant phone message was left for the patient providing contact information and requesting a return call. The care management team will reach out to the patient again over the next 7 days.  If patient returns call to provider office, please advise to call [place] * at [phone].  [place] Management  Direct Dial: [phone]

## 2021-11-03 ENCOUNTER — Encounter: Payer: Self-pay | Admitting: Internal Medicine

## 2021-11-04 MED ORDER — ROSUVASTATIN CALCIUM 5 MG PO TABS: 5.0000 mg | ORAL_TABLET | ORAL | 2 refills | Status: DC

## 2021-11-08 NOTE — Chronic Care Management (AMB) (Signed)
  Chronic Care Management   Note  11/08/2021 Name: Collin [name] MRN: [id number] DOB: Aug 14, 1925  Collin [name] is a 87 y.o. year old male who is a primary care patient of Burns, Claudina Lick, MD. I reached out to Pershing Cox by phone today in response to a referral sent by Collin [name].  Collin [name] was given information about Chronic Care Management services today including:  CCM service includes personalized support from designated clinical staff supervised by his physician, including individualized plan of care and coordination with other care providers 24/7 contact phone numbers for assistance for urgent and routine care needs. Service will only be billed when office clinical staff spend 20 minutes or more in a month to coordinate care. Only one practitioner may furnish and bill the service in a calendar month. The patient may stop CCM services at any time (effective at the end of the month) by phone call to the office staff. The patient is responsible for co-pay (up to 20% after annual deductible is met) if co-pay is required by the individual health plan.   Patient agreed to services and verbal consent obtained.   Follow up plan: Telephone appointment with care management team member scheduled for:11/16/21  [place] Management  Direct Dial: [phone]

## 2021-11-11 ENCOUNTER — Other Ambulatory Visit: Payer: Self-pay | Admitting: Cardiovascular Disease

## 2021-11-16 ENCOUNTER — Ambulatory Visit (INDEPENDENT_AMBULATORY_CARE_PROVIDER_SITE_OTHER): Payer: Medicare Other | Admitting: *Deleted

## 2021-11-16 DIAGNOSIS — E1122 Type 2 diabetes mellitus with diabetic chronic kidney disease: Secondary | ICD-10-CM

## 2021-11-16 DIAGNOSIS — E785 Hyperlipidemia, unspecified: Secondary | ICD-10-CM

## 2021-11-16 NOTE — Patient Instructions (Addendum)
Visit Information   [name], thank you for taking time to talk with me today. Please don't hesitate to contact me if I can be of assistance to you before our next scheduled telephone appointment.  Following are the goals we discussed today:  Patient Self-Care Activities: Patient Collin [name] will: Self administer medications as prescribed Attend all scheduled provider appointments Call pharmacy for medication refills Call provider office for new concerns or questions Continue to follow heart healthy, low salt, low cholesterol, carbohydrate-modified, low sugar diet Review enclosed educational material   Our next appointment is by telephone on Tuesday, February 01, 2022 at 2:00 pm  Please call the care guide team at [phone] if you need to cancel or reschedule your appointment.   Oneta Rack, RN, BSN, [facility] RN Care Coordination- [place] [phone]: direct office [phone]: mobile   Statin Intolerance Statin intolerance is the inability to take a certain type of cholesterol-lowering medicine (statin) because of unwanted side effects, such as muscle pain. Statins may be prescribed to decrease cholesterol levels and lower the risk for heart attack, heart disease, and stroke. People with statin intolerance may experience muscle pain and cramps (myalgia) that go away when the statin is stopped. What are the causes? The cause of this condition is not known. What increases the risk? You may be at higher risk for statin intolerance if you: Take more than one type of cholesterol-lowering medicine at a time. Need a higher-than-normal dosage of a statin. Have any of these medical problems: A history of surgery or trauma. Kidney, liver, joint, or muscle disease. A low level of hormones that control how your body uses energy (hypothyroidism). A lack of vitamin D (deficiency). Have a family history of muscle aches with or without statin therapy. Drink a  lot of alcohol. Have any of these characteristics: Being older than 85 years of age. Having a body size that is smaller than normal. Being male. Being of Asian descent. Take certain medicines, including: Certain medicines for mental illness (antipsychotics). Some types of antibiotics or antifungals. Certain medicines used for blood pressure or heart disease. Medicines that reduce the activity of the body's disease-fighting system (immunosuppressants). Medicines to treat hepatitis C and HIV or AIDS (human immunodeficiency virus or acquired immunodeficiency syndrome). Follow an intense exercise program. Drink a lot of grapefruit juice. What are the signs or symptoms? Symptoms of statin intolerance include: General muscle aches, or myalgia. This may feel similar to muscle aches that are caused by the flu. Muscle pain, tenderness, cramps, or weakness (myositis). Severe muscle pain, weakness, and raised blood CK levels (rhabdomyolysis). Symptoms usually go away when the statin is stopped. Rarely, liver damage can also occur, which can cause: Dark yellow or brown urine. Light-colored stool (feces). Loss of appetite. Pain in the upper right abdomen. Unusual weakness or fatigue. Yellowing of the skin or the white parts of the eyes (jaundice). How is this diagnosed? This condition is diagnosed based on your symptoms, your medical history, and a physical exam. You may also have blood tests. How is this treated? Your health care provider may have you stop taking the statin for a short time to see if your symptoms go away. Then your provider may restart your statin or: Change you to a different statin. Lower the dosage of your statin. Have you take your statin less often. Change you to another type of cholesterol-lowering medicine. Stop or change any medicines that might be interfering with your statin. Limit how  much grapefruit juice you drink. Recommend stopping intense exercise. In  most cases, statin intolerance can be managed, and you can continue to take a cholesterol-lowering medicine. Follow these instructions at home: Medicines Take your statin medicine as told by your health care provider. Do not stop taking the statin unless your health care provider tells you to stop. Take other over-the-counter and prescription medicines only as told by your health care provider. Check with your health care provider before taking any new medicines. Certain medicines can increase your risk for statin intolerance. Lifestyle If you drink alcohol: Limit how much you have to: 0-1 drink a day for women who are not pregnant. 0-2 drinks a day for men. Know how much alcohol is in a drink. In the [place] one drink equals one 12 oz bottle of beer (355 mL), one 5 oz glass of wine (148 mL), or one 1 oz glass of hard liquor (44 mL). Do not use any products that contain nicotine or tobacco. These products include cigarettes, chewing tobacco, and vaping devices, such as e-cigarettes. If you need help quitting, ask your health care provider. General instructions  Have blood tests to check CK levels or liver enzymes as told by your health care provider. Exercise as directed. Ask your health care provider what exercises are best for you. Do not start a new exercise program before talking about it with your health care provider. Follow instructions from your health care provider about eating or drinking restrictions. Your health care provider may recommend: Limiting the amount of grapefruit juice you drink, or not drinking it at all. Eating a diet that is low in saturated fats and high in fiber. Maintain a healthy weight with diet and exercise. Keep all follow-up visits. This is important. Contact a health care provider if: You have any symptoms of statin intolerance. Summary Statins are important medicines for decreasing your cholesterol, which may reduce your risk for heart attack and  stroke. Some people are not able to continue taking a particular statin because of muscle problems (myalgia) or other side effects. Myalgia is the most common symptom of statin intolerance. Often, the muscle pain and cramps from myalgia go away when the statin is stopped. Although rare, liver damage can occur as a result of statin intolerance. You should have routine blood tests to check your liver enzymes. In most cases, statin intolerance can be managed, and you can continue to take a cholesterol-lowering medicine. This information is not intended to replace advice given to you by your health care provider. Make sure you discuss any questions you have with your health care provider. Document Revised: 02/11/2021 Document Reviewed: 02/11/2021 Elsevier Patient Education  2022 [employer].   Following is a copy of your full care plan:  Care Plan : RN Care Manager Plan of Care  Updates made by Knox Royalty, RN since 11/16/2021 12:00 AM     Problem: Chronic Disease Management Needs   Priority: High     Long-Range Goal: Development of plan of care for long term chronic disease management   Start Date: 11/16/2021  Expected End Date: 11/16/2022  Priority: High  Note:   Current Barriers:  Chronic Disease Management support and education needs related to HLD and DMII  RNCM Clinical Goal(s):  Patient will demonstrate ongoing health management independence as evidenced by DMII; HLD        through collaboration with RN Care manager, provider, and care team.   Interventions: 1:1 collaboration with primary care provider regarding  development and update of comprehensive plan of care as evidenced by provider attestation and co-signature Inter-disciplinary care team collaboration (see longitudinal plan of care) Evaluation of current treatment plan related to  self management and patient's adherence to plan as established by provider  Diabetes:  (Status: New goal.) Long Term Goal   Lab  Results  Component Value Date   HGBA1C 6.4 07/23/2021  Assessed patient's understanding of A1c goal: <7% Provided education to patient about basic DM disease process; Reviewed prescribed diet with patient heart healthy, low sodium, low cholesterol, carbohydrate modified/ low sugar; Discussed plans with patient for ongoing care management follow up and provided patient with direct contact information for care management team;      Reviewed scheduled/upcoming provider appointments including: 01/12/21- podiatry; 01/24/21- PCP;         Review of patient status, including review of consultants reports, relevant laboratory and other test results, and medications completed;       Screening for signs and symptoms of depression related to chronic disease state;        Assessed social determinant of health barriers;        Confirmed patient is "pre-diabetic;" control diabetes with routine monitoring of A1-C/ diet Reviewed individual historical A1-C trends and provided education around correlation of A1-C value to blood sugar levels at home over 3 months Confirmed patient stays active- bowls regularly-- benefits of activity/ regular exercise discussed with patient Discussed CKD- III diagnosis with patient and reviewed most recent labs for renal function  Confirmed patient adherent to prescribed diet: positive reinforcement provided with encouragement to continue efforts  Hyperlipidemia:  (Status: New goal.) Long Term Goal   Lab Results  Component Value Date   CHOL 129 07/23/2021   HDL 39.40 07/23/2021   LDLCALC 53 01/18/2021   LDLDIRECT 60.0 07/23/2021   TRIG 219.0 (H) 07/23/2021   CHOLHDL 3 07/23/2021    Medication review performed; medication list updated in electronic medical record.  Provider established cholesterol goals reviewed; Counseled on importance of regular laboratory monitoring as prescribed; Provided HLD educational materials; Reviewed role and benefits of statin for ASCVD risk  reduction; Discussed strategies to manage statin-induced myalgias; Reviewed importance of limiting foods high in cholesterol; Reviewed exercise goals and target of 150 minutes per week; Confirmed patient now taking Rosuvastatin 5 mg every other day, per instruction of cardiologist- medication list updated accordingly; confirmed previously reported myalgia has improved with scheduling change Confirmed patient monitors blood pressures at home daily: he reports general consistent ranges between 120-130/ 58-60; noted one isolated lower reading recently of 100/50 Confirmed patient has received flu vaccine for 2022-23 flu/ winter season (08/31/21; confirmed most recent COVID vaccine booster obtained 10/13/21  Patient Goals/Self-Care Activities: As evidenced by review of EHR, collaboration with care team, and patient reporting during CCM RN CM outreach, Patient Collin [name] will: Self administer medications as prescribed Attend all scheduled provider appointments Call pharmacy for medication refills Call provider office for new concerns or questions Continue to follow heart healthy, low salt, low cholesterol, carbohydrate-modified, low sugar diet Review enclosed educational material       Consent to CCM Services: Collin [name] was given information about Chronic Care Management services 1114/22 including:  CCM service includes personalized support from designated clinical staff supervised by his physician, including individualized plan of care and coordination with other care providers 24/7 contact phone numbers for assistance for urgent and routine care needs. Service will only be billed when office clinical staff spend 20 minutes or more in a  month to coordinate care. Only one practitioner may furnish and bill the service in a calendar month. The patient may stop CCM services at any time (effective at the end of the month) by phone call to the office staff. The patient will be responsible for cost sharing  (co-pay) of up to 20% of the service fee (after annual deductible is met).  Patient agreed to services and verbal consent obtained.   Patient verbalizes understanding of instructions provided today and agrees to view in [place] The patient has been provided with contact information for the care management team and has been advised to call with any health related questions or concerns  Oneta Rack, RN, BSN, [place] [phone]: direct office [phone]: mobile

## 2021-11-16 NOTE — Chronic Care Management (AMB) (Signed)
Chronic Care Management   CCM RN Visit Note  11/16/2021 Name: Collin [name] MRN: [id number] DOB: 1925/11/27  Subjective: Collin [name] is a 87 y.o. year old male who is a primary care patient of Burns, Claudina Lick, MD. The care management team was consulted for assistance with disease management and care coordination needs.    Engaged with patient by telephone for initial visit in response to provider referral for case management and/or care coordination services.   Consent to Services:  The patient was given information about Chronic Care Management services, agreed to services, and gave verbal consent 11/08/21 prior to initiation of services.  Please see initial visit note for detailed documentation.   Patient agreed to services and verbal consent obtained.   Assessment: Review of patient past medical history, allergies, medications, health status, including review of consultants reports, laboratory and other test data, was performed as part of comprehensive evaluation and provision of chronic care management services.   SDOH (Social Determinants of Health) assessments and interventions performed:  SDOH Interventions    Flowsheet Row Most Recent Value  SDOH Interventions   Food Insecurity Interventions Intervention Not Indicated  Housing Interventions Intervention Not Indicated  [One level single family home,  has lived there for 88 years,  currently lives alone]  Transportation Interventions Intervention Not Indicated  [Patient drives self]      CCM Care Plan Allergies  Allergen Reactions   Crestor [Rosuvastatin]     Muscle pain   Atorvastatin     Muscle aches    Peanut Oil Rash    sneezing   Trandolapril Cough   Outpatient Encounter Medications as of 11/16/2021  Medication Sig   rosuvastatin (CRESTOR) 5 MG tablet Take 5 mg by mouth every other day.   acetaminophen (TYLENOL) 500 MG tablet Take 500 mg by mouth every 6 (six) hours as needed for moderate pain or headache.     alfuzosin (UROXATRAL) 10 MG 24 hr tablet Take 10 mg by mouth daily.    aspirin EC 81 MG tablet Take 1 tablet (81 mg total) by mouth daily.   carvedilol (COREG) 3.125 MG tablet TAKE 1 TABLET TWICE A DAY   clopidogrel (PLAVIX) 75 MG tablet TAKE 1 TABLET DAILY   COVID-19 mRNA bivalent vaccine, Moderna, (MODERNA COVID-19 BIVAL BOOSTER) 50 MCG/0.5ML injection Inject into the muscle.   fenofibrate (TRICOR) 48 MG tablet Take 1 tablet (48 mg total) by mouth daily. Please make yearly appt with Dr. Acie Fredrickson for January 2023 for future refills. Thank you 1st attempt   finasteride (PROSCAR) 5 MG tablet Take 5 mg by mouth daily.   fluocinonide ointment (LIDEX) 0.05 % SMARTSIG:1 Sparingly Topical Twice Daily   losartan (COZAAR) 50 MG tablet TAKE 1/2 TABLET (25MG       TOTAL) TWICE A DAY   metroNIDAZOLE (METROGEL) 0.75 % gel Apply topically 2 (two) times daily.   Multiple Vitamin (MULTIVITAMIN) tablet Take 1 tablet by mouth daily.   nitroGLYCERIN (NITROSTAT) 0.4 MG SL tablet Place 1 tablet (0.4 mg total) under the tongue every 5 (five) minutes as needed for chest pain.   sodium chloride (OCEAN) 0.65 % SOLN nasal spray Place 1 spray into both nostrils as needed for congestion.   triamcinolone ointment (KENALOG) 0.1 % Apply 1 application topically daily.    No facility-administered encounter medications on file as of 11/16/2021.   Patient Active Problem List   Diagnosis Date Noted   Unstable angina (HCC)    Shortness of breath 11/01/2019   Thrombocytopenia (  [place])    Skin cancer    Pulmonary nodule    Nocturia    Myocardial infarction (HCC)    Low back pain syndrome    COPD (chronic obstructive pulmonary disease) (HCC)    BPH (benign prostatic hyperplasia)    Bladder cancer (HCC)    Arthritis    CKD (chronic kidney disease), stage III 07/10/2017   Diabetes ([place]) 04/15/2017   Fatty liver 06/18/2016   Rash and nonspecific skin eruption 06/13/2016   H/O bacterial endocarditis 04/06/2016   Myalgia and  myositis 12/18/2015   Abnormal CT of the abdomen 12/31/2013   Abnormal chest x-ray 12/31/2013   Gynecomastia    Spinal stenosis, lumbar 05/12/2012   CAD (coronary artery disease)    Carotid artery disease (HCC)    CERVICAL RADICULOPATHY, LEFT 12/07/2009   SKIN CANCER, HX OF 12/07/2009   Renal cell carcinoma ([place]) 07/18/2009   ANEMIA, 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   GERD 07/18/2009   Essential hypertension 04/23/2008   Dyslipidemia 01/25/2007   Cancer ([place]) 02/28/2001   Conditions to be addressed/monitored:  HLD and DMII  Care Plan : RN Care Manager Plan of Care  Updates made by Knox Royalty, RN since 11/16/2021 12:00 AM     Problem: Chronic Disease Management Needs   Priority: High     Long-Range Goal: Development of plan of care for long term chronic disease management   Start Date: 11/16/2021  Expected End Date: 11/16/2022  Priority: High  Note:   Current Barriers:  Chronic Disease Management support and education needs related to HLD and DMII  RNCM Clinical Goal(s):  Patient will demonstrate ongoing health management independence as evidenced by DMII; HLD        through collaboration with RN Care manager, provider, and care team.   Interventions: 1:1 collaboration with primary care provider regarding development and update of comprehensive plan of care as evidenced by provider attestation and co-signature Inter-disciplinary care team collaboration (see longitudinal plan of care) Evaluation of current treatment plan related to  self management and patient's adherence to plan as established by provider  Diabetes:  (Status: New goal.) Long Term Goal   Lab Results  Component Value Date   HGBA1C 6.4 07/23/2021  Assessed patient's understanding of A1c goal: <7% Provided education to patient about basic DM disease process; Reviewed prescribed diet with patient heart healthy, low sodium, low cholesterol, carbohydrate modified/ low sugar; Discussed plans with patient for  ongoing care management follow up and provided patient with direct contact information for care management team;      Reviewed scheduled/upcoming provider appointments including: 01/12/21- podiatry; 01/24/21- PCP;         Review of patient status, including review of consultants reports, relevant laboratory and other test results, and medications completed;       Screening for signs and symptoms of depression related to chronic disease state;        Assessed social determinant of health barriers;        Confirmed patient is "pre-diabetic;" control diabetes with routine monitoring of A1-C/ diet Reviewed individual historical A1-C trends and provided education around correlation of A1-C value to blood sugar levels at home over 3 months Confirmed patient stays active- bowls regularly-- benefits of activity/ regular exercise discussed with patient Discussed CKD- III diagnosis with patient and reviewed most recent labs for renal function  Confirmed patient adherent to prescribed diet: positive reinforcement provided with encouragement to continue efforts  Hyperlipidemia:  (Status: New goal.) Long Term Goal   Lab  Results  Component Value Date   CHOL 129 07/23/2021   HDL 39.40 07/23/2021   LDLCALC 53 01/18/2021   LDLDIRECT 60.0 07/23/2021   TRIG 219.0 (H) 07/23/2021   CHOLHDL 3 07/23/2021    Medication review performed; medication list updated in electronic medical record.  Provider established cholesterol goals reviewed; Counseled on importance of regular laboratory monitoring as prescribed; Provided HLD educational materials; Reviewed role and benefits of statin for ASCVD risk reduction; Discussed strategies to manage statin-induced myalgias; Reviewed importance of limiting foods high in cholesterol; Reviewed exercise goals and target of 150 minutes per week; Confirmed patient now taking Rosuvastatin 5 mg every other day, per instruction of cardiologist- medication list updated accordingly;  confirmed previously reported myalgia has improved with scheduling change Confirmed patient monitors blood pressures at home daily: he reports general consistent ranges between 120-130/ 58-60; noted one isolated lower reading recently of 100/50 Confirmed patient has received flu vaccine for 2022-23 flu/ winter season (08/31/21; confirmed most recent COVID vaccine booster obtained 10/13/21  Patient Goals/Self-Care Activities: As evidenced by review of EHR, collaboration with care team, and patient reporting during CCM RN CM outreach, Patient Collin [name] will: Self administer medications as prescribed Attend all scheduled provider appointments Call pharmacy for medication refills Call provider office for new concerns or questions Continue to follow heart healthy, low salt, low cholesterol, carbohydrate-modified, low sugar diet Review enclosed educational material       Plan: Telephone follow up appointment with care management team member scheduled for:  Tuesday, February 01, 2022 at 2:00 pm The patient has been provided with contact information for the care management team and has been advised to call with any health related questions or concerns  Oneta Rack, RN, BSN, [place] [phone]: direct office [phone]: mobile

## 2021-11-24 DIAGNOSIS — E785 Hyperlipidemia, unspecified: Secondary | ICD-10-CM | POA: Diagnosis not present

## 2021-11-24 DIAGNOSIS — E1122 Type 2 diabetes mellitus with diabetic chronic kidney disease: Secondary | ICD-10-CM

## 2021-11-24 DIAGNOSIS — N1831 Chronic kidney disease, stage 3a: Secondary | ICD-10-CM | POA: Diagnosis not present

## 2021-12-03 ENCOUNTER — Ambulatory Visit (INDEPENDENT_AMBULATORY_CARE_PROVIDER_SITE_OTHER): Payer: Medicare Other

## 2021-12-03 ENCOUNTER — Other Ambulatory Visit: Payer: Self-pay

## 2021-12-03 DIAGNOSIS — Z0000 Encounter for general adult medical examination without abnormal findings: Secondary | ICD-10-CM

## 2021-12-03 NOTE — Progress Notes (Signed)
I connected with Collin [name] today by telephone and verified that I am speaking with the correct person using two identifiers. Location patient: home Location provider: work Persons participating in the virtual visit: patient, provider.   I discussed the limitations, risks, security and privacy concerns of performing an evaluation and management service by telephone and the availability of in person appointments. I also discussed with the patient that there may be a patient responsible charge related to this service. The patient expressed understanding and verbally consented to this telephonic visit.    Interactive audio and video telecommunications were attempted between this provider and patient, however failed, due to patient having technical difficulties OR patient did not have access to video capability.  We continued and completed visit with audio only.  Some vital signs may be absent or patient reported.   Time Spent with patient on telephone encounter: 40 minutes  Subjective:   Collin [name] is a 87 y.o. male who presents for Medicare Annual/Subsequent preventive examination.  Review of Systems     Cardiac Risk Factors include: advanced age (>90men, >60 women);family history of premature cardiovascular disease;hypertension;dyslipidemia;male gender     Objective:    Today's Vitals   12/03/21 1345  PainSc: 0-No pain   There is no height or weight on file to calculate BMI.  Advanced Directives 12/03/2021 10/15/2020 11/27/2019 10/14/2019 10/08/2018 10/04/2017 08/06/2016  Does Patient Have a Medical Advance Directive? Yes Yes Yes Yes Yes Yes No  Type of Advance Directive Living will;Healthcare Power of [place];Living will Living will;Healthcare Power of [place];Living will [place];Living will [place];Living will -  Does patient want to make changes to medical advance directive? No -  Patient declined No - Patient declined No - Patient declined - - - -  Copy of [place] in Chart? No - copy requested No - copy requested Yes - validated most recent copy scanned in chart (See row information) No - copy requested No - copy requested No - copy requested -  Would patient like information on creating a medical advance directive? - - - - - - No - patient declined information    Current Medications (verified) Outpatient Encounter Medications as of 12/03/2021  Medication Sig   acetaminophen (TYLENOL) 500 MG tablet Take 500 mg by mouth every 6 (six) hours as needed for moderate pain or headache.    alfuzosin (UROXATRAL) 10 MG 24 hr tablet Take 10 mg by mouth daily.    aspirin EC 81 MG tablet Take 1 tablet (81 mg total) by mouth daily.   carvedilol (COREG) 3.125 MG tablet TAKE 1 TABLET TWICE A DAY   clopidogrel (PLAVIX) 75 MG tablet TAKE 1 TABLET DAILY   COVID-19 mRNA bivalent vaccine, Moderna, (MODERNA COVID-19 BIVAL BOOSTER) 50 MCG/0.5ML injection Inject into the muscle.   fenofibrate (TRICOR) 48 MG tablet Take 1 tablet (48 mg total) by mouth daily. Please make yearly appt with Dr. Acie Fredrickson for January 2023 for future refills. Thank you 1st attempt   finasteride (PROSCAR) 5 MG tablet Take 5 mg by mouth daily.   fluocinonide ointment (LIDEX) 0.05 % SMARTSIG:1 Sparingly Topical Twice Daily   losartan (COZAAR) 50 MG tablet TAKE 1/2 TABLET (25MG       TOTAL) TWICE A DAY   metroNIDAZOLE (METROGEL) 0.75 % gel Apply topically 2 (two) times daily.   Multiple Vitamin (MULTIVITAMIN) tablet Take 1 tablet by mouth daily.   nitroGLYCERIN (NITROSTAT) 0.4  MG SL tablet Place 1 tablet (0.4 mg total) under the tongue every 5 (five) minutes as needed for chest pain.   rosuvastatin (CRESTOR) 5 MG tablet Take 5 mg by mouth every other day.   sodium chloride (OCEAN) 0.65 % SOLN nasal spray Place 1 spray into both nostrils as needed for congestion.   triamcinolone ointment (KENALOG) 0.1 %  Apply 1 application topically daily.    No facility-administered encounter medications on file as of 12/03/2021.    Allergies (verified) Crestor [rosuvastatin], Atorvastatin, Peanut oil, and Trandolapril   History: Past Medical History:  Diagnosis Date   Allergy    Anemia    Arthritis    gout   Bladder cancer (HCC)    BPH (benign prostatic hyperplasia)    CAD (coronary artery disease) 2005   a. diaphragmatic MI s/p DES to RCA 2005, residual disease. b. [place] 11/2019 s/p DES to PDA, mRCA, mid Cx.   Cancer ([place]) 02/28/2001   Left ureter cancer/bladder   Carcinoma, renal cell ([place])    Cardiomyopathy ([place])    a. EF 45-50% by echo 12/2018.   Carotid artery disease ([place])    Doppler, September, 2010, normal, no carotid artery disease   CKD (chronic kidney disease), stage III (HCC)    COPD (chronic obstructive pulmonary disease) ([place])    Dysfunction of eustachian tube    Ejection fraction    EF 55%, echo, January, 2009   Encounter for long-term (current) use of other medications    Enlarged prostate    GERD (gastroesophageal reflux disease)    Gynecomastia    b/l   HTN (hypertension)    Hx of cystoscopy 11/02/12   negative   Hx of skin cancer, basal cell    Hypercholesterolemia    Hyperlipidemia    Low back pain syndrome    Myocardial infarction ([place])    hx acute   Nocturia    Organic impotence    Pulmonary nodule    Chest CT, followed   Reactive airway disease    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   Staph infection 04/1997   Thrombocytopenia [facility])    Past Surgical History:  Procedure Laterality Date   BACK SURGERY  02/1996   bladder tumors  2003, 2004, 01/2009   removed   CARDIAC CATHETERIZATION     CHOLECYSTECTOMY  07/2003   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   sutured, right   CORNEAL TRANSPLANT  10/2008   left   CORONARY STENT INTERVENTION  11/28/2019   CORONARY STENT INTERVENTION N/A 11/28/2019   Procedure: CORONARY STENT INTERVENTION;  Surgeon:  Burnell Blanks, MD;  Location: [facility];  Service: Cardiovascular;  Laterality: N/A;   LEFT HEART CATH AND CORONARY ANGIOGRAPHY N/A 11/27/2019   Procedure: LEFT HEART CATH AND CORONARY ANGIOGRAPHY;  Surgeon: Burnell Blanks, MD;  Location: [facility];  Service: Cardiovascular;  Laterality: N/A;   NEPHRECTOMY  02/2001   left kidney, ureter   osteomyelitis     staph aureus   URETERECTOMY  2003   for cancer   Family History  Problem Relation Age of Onset   Heart failure Mother 68   Diabetes Mother    Coronary artery disease Brother    Diabetes Brother    Drug abuse Brother    Diabetes Sister    Diabetes Sister    Social History   Socioeconomic History   Marital status: Widowed    Spouse name: Not on file  Number of children: 1   Years of education: Not on file   Highest education level: Not on file  Occupational History   Occupation: retired    Comment: [employer]  Tobacco Use   Smoking status: Former    Types: Pipe    Quit date: 12/27/1991    Years since quitting: 29.9   Smokeless tobacco: Former   Tobacco comments:    Quit 20-30 years ago as of 2012  Vaping Use   Vaping Use: Never used  Substance and Sexual Activity   Alcohol use: No    Alcohol/week: 0.0 standard drinks   Drug use: No   Sexual activity: Not Currently  Other Topics Concern   Not on file  Social History Narrative   30 minutes a day for 5 days a week   Social Determinants of Health   Financial Resource Strain: Low Risk    Difficulty of Paying Living Expenses: Not hard at all  Food Insecurity: No Food Insecurity   Worried About Charity fundraiser in the Last Year: Never true   [place] in the Last Year: Never true  Transportation Needs: No Transportation Needs   Lack of Transportation (Medical): No   Lack of Transportation (Non-Medical): No  Physical Activity: Sufficiently Active   Days of Exercise per Week: 5 days   Minutes of Exercise per  Session: 30 min  Stress: No Stress Concern Present   Feeling of Stress : Not at all  Social Connections: Moderately Integrated   Frequency of Communication with Friends and Family: Twice a week   Frequency of Social Gatherings with Friends and Family: Once a week   Attends Religious Services: More than 4 times per year   Active Member of [employer] or Organizations: Yes   Attends Archivist Meetings: More than 4 times per year   Marital Status: Widowed    Tobacco Counseling Counseling given: Not Answered Tobacco comments: Quit 20-30 years ago as of 2012   Clinical Intake:  Pre-visit preparation completed: Yes  Pain : No/denies pain Pain Score: 0-No pain     Nutritional Risks: None Diabetes: No (Prediabetes)  How often do you need to have someone help you when you read instructions, pamphlets, or other written materials from your doctor or pharmacy?: 1 - Never What is the last grade level you completed in school?: 1.5 years of college courses  Diabetic? no  Interpreter Needed?: No  Information entered by :: Lisette Abu, LPN   Activities of Daily Living In your present state of health, do you have any difficulty performing the following activities: 12/03/2021  Hearing? N  Vision? N  Difficulty concentrating or making decisions? N  Walking or climbing stairs? N  Dressing or bathing? N  Doing errands, shopping? N  Preparing Food and eating ? N  Using the Toilet? N  In the past six months, have you accidently leaked urine? N  Do you have problems with loss of bowel control? N  Managing your Medications? N  Managing your Finances? N  Housekeeping or managing your Housekeeping? N  Some recent data might be hidden    Patient Care Team: Binnie Rail, MD as PCP - General (Internal Medicine) Nahser, Wonda Cheng, MD as PCP - Cardiology (Cardiology) Knox Royalty, RN as Case Manager Drake Leach, [place] (Optometry)  Indicate any recent Medical Services you may  have received from other than [facility] providers in the past year (date may be approximate).  Assessment:   This is a routine wellness examination for [place].  Hearing/Vision screen Hearing Screening - Comments:: Patient denied any hearing difficulty.   No hearing aids.  Vision Screening - Comments:: Patient wears corrective glasses/contacts.  Eye exam done annually by: Dr. Beatrix Fetters  Dietary issues and exercise activities discussed: Current Exercise Habits: Home exercise routine, Type of exercise: walking;Other - see comments (bowling 2-3 times a week with bowling group), Time (Minutes): 30, Frequency (Times/Week): 5, Weekly Exercise (Minutes/Week): 150, Intensity: Moderate   Goals Addressed               This Visit's Progress     Patient Stated (pt-stated)        Patient declined health goal at this time.      Depression Screen PHQ 2/9 Scores 12/03/2021 11/16/2021 10/15/2020 10/14/2019 07/08/2019 01/07/2019 10/08/2018  PHQ - 2 Score 0 0 0 1 0 0 0  PHQ- 9 Score - - - 4 - - -    Fall Risk Fall Risk  12/03/2021 11/16/2021 10/15/2020 10/14/2019 07/08/2019  Falls in the past year? 0 0 0 0 0  Comment - - - - -  Number falls in past yr: 0 0 0 0 0  Injury with Fall? 0 0 0 0 -  Comment - N/A- no falls reported - - -  Risk for fall due to : No Fall Risks No Fall Risks No Fall Risks - -  Follow up Falls prevention discussed Falls prevention discussed Falls evaluation completed - -    FALL RISK PREVENTION PERTAINING TO THE HOME:  Any stairs in or around the home? No  If so, are there any without handrails? No  Home free of loose throw rugs in walkways, pet beds, electrical cords, etc? Yes  Adequate lighting in your home to reduce risk of falls? Yes   ASSISTIVE DEVICES UTILIZED TO PREVENT FALLS:  Life alert? No  Use of a cane, walker or w/c? No  Grab bars in the bathroom? Yes  Shower chair or bench in shower? No  Elevated toilet seat or a handicapped toilet? No   TIMED UP  AND GO:  Was the test performed? No .  Length of time to ambulate 10 feet: n/a sec.   Gait steady and fast without use of assistive device  Cognitive Function: Normal cognitive status assessed by direct observation by this Nurse Health Advisor. No abnormalities found.   MMSE - Mini Mental State Exam 10/08/2018 10/04/2017 04/06/2016  Not completed: - - (No Data)  Orientation to time 5 5 -  Orientation to Place 5 5 -  Registration 3 3 -  Attention/ Calculation 5 5 -  Recall 3 3 -  Language- name 2 objects 2 2 -  Language- repeat 1 1 -  Language- follow 3 step command 3 3 -  Language- read & follow direction 1 1 -  Write a sentence 1 1 -  Copy design 1 1 -  Total score 30 30 -     6CIT Screen 10/14/2019  What Year? 0 points  What month? 0 points  What time? 0 points  Count back from 20 0 points  Months in reverse 0 points  Repeat phrase 0 points  Total Score 0    Immunizations Immunization History  Administered Date(s) Administered   Influenza Whole 09/17/2008, 07/26/2012, 09/10/2013   Influenza, High Dose Seasonal PF 08/29/2014, 10/07/2016, 08/04/2018, 08/15/2019   Influenza-Unspecified 08/31/2015, 09/21/2017, 10/22/2021   Moderna Covid-19 Vaccine Bivalent Booster 24yrs &  up 10/22/2021   Moderna SARS-COV2 Booster Vaccination 10/24/2020   PFIZER(Purple Top)SARS-COV-2 Vaccination 01/07/2020, 02/04/2020, 10/24/2020, 05/28/2021   Pneumococcal Conjugate-13 11/07/2016   Pneumococcal-Unspecified 12/26/1996   Tdap 07/17/2017   Zoster Recombinat (Shingrix) 10/06/2017, 12/11/2017    TDAP status: Up to date  Flu Vaccine status: Up to date  Pneumococcal vaccine status: Due, Education has been provided regarding the importance of this vaccine. Advised may receive this vaccine at local pharmacy or Health Dept. Aware to provide a copy of the vaccination record if obtained from local pharmacy or Health Dept. Verbalized acceptance and understanding. (Prevnar20  recommended)  Covid-19 vaccine status: Completed vaccines  Qualifies for Shingles Vaccine? Yes   Zostavax completed No   Shingrix Completed?: Yes  Screening Tests Health Maintenance  Topic Date Due   Pneumonia Vaccine 68+ Years old (2 - PPSV23 if available, else PCV20) 11/07/2017   HEMOGLOBIN A1C  01/23/2022   OPHTHALMOLOGY EXAM  06/30/2022   FOOT EXAM  10/12/2022   TETANUS/TDAP  07/18/2027   INFLUENZA VACCINE  Completed   COVID-19 Vaccine  Completed   Zoster Vaccines- Shingrix  Completed   HPV VACCINES  Aged Out    Health Maintenance  Health Maintenance Due  Topic Date Due   Pneumonia Vaccine 81+ Years old (2 - PPSV23 if available, else PCV20) 11/07/2017    Colorectal cancer screening: No longer required.   Lung Cancer Screening: (Low Dose CT Chest recommended if Age 37-80 years, 30 pack-year currently smoking OR have quit w/in 15years.) does not qualify.   Lung Cancer Screening Referral: no  Additional Screening:  Hepatitis C Screening: does not qualify; Completed no  Vision Screening: Recommended annual ophthalmology exams for early detection of glaucoma and other disorders of the eye. Is the patient up to date with their annual eye exam?  Yes  Who is the provider or what is the name of the office in which the patient attends annual eye exams? Dr. Beatrix Fetters If pt is not established with a provider, would they like to be referred to a provider to establish care? No .   Dental Screening: Recommended annual dental exams for proper oral hygiene  Community Resource Referral / Chronic Care Management: CRR required this visit?  No   CCM required this visit?  No      Plan:     I have personally reviewed and noted the following in the patient's chart:   Medical and social history Use of alcohol, tobacco or illicit drugs  Current medications and supplements including opioid prescriptions. Patient is not currently taking opioid prescriptions. Functional ability  and status Nutritional status Physical activity Advanced directives List of other physicians Hospitalizations, surgeries, and ER visits in previous 12 months Vitals Screenings to include cognitive, depression, and falls Referrals and appointments  In addition, I have reviewed and discussed with patient certain preventive protocols, quality metrics, and best practice recommendations. A written personalized care plan for preventive services as well as general preventive health recommendations were provided to patient.     Sheral Flow, LPN   14/03/8184   Nurse Notes:  Patient is cogitatively intact. There were no vitals filed for this visit. There is no height or weight on file to calculate BMI. Hearing Screening - Comments:: Patient denied any hearing difficulty.   No hearing aids.  Vision Screening - Comments:: Patient wears corrective glasses/contacts.  Eye exam done annually by: Dr. Beatrix Fetters

## 2022-01-12 ENCOUNTER — Other Ambulatory Visit: Payer: Self-pay

## 2022-01-12 ENCOUNTER — Encounter: Payer: Self-pay | Admitting: Podiatry

## 2022-01-12 ENCOUNTER — Ambulatory Visit (INDEPENDENT_AMBULATORY_CARE_PROVIDER_SITE_OTHER): Payer: Medicare Other | Admitting: Podiatry

## 2022-01-12 DIAGNOSIS — D689 Coagulation defect, unspecified: Secondary | ICD-10-CM | POA: Diagnosis not present

## 2022-01-12 DIAGNOSIS — B351 Tinea unguium: Secondary | ICD-10-CM | POA: Diagnosis not present

## 2022-01-12 DIAGNOSIS — E119 Type 2 diabetes mellitus without complications: Secondary | ICD-10-CM | POA: Diagnosis not present

## 2022-01-12 DIAGNOSIS — N1832 Chronic kidney disease, stage 3b: Secondary | ICD-10-CM

## 2022-01-12 DIAGNOSIS — M79675 Pain in left toe(s): Secondary | ICD-10-CM

## 2022-01-12 DIAGNOSIS — M79674 Pain in right toe(s): Secondary | ICD-10-CM

## 2022-01-12 NOTE — Progress Notes (Signed)
This patient returns to my office for at risk foot care.  This patient requires this care by a professional since this patient will be at risk due to having thrombocytopenia, CKD, and diabetes.  This patient is unable to cut nails himself since the patient cannot reach his nails.These nails are painful walking and wearing shoes.  This patient presents for at risk foot care today. . Patient is taking plavix.  General Appearance  Alert, conversant and in no acute stress.  Vascular  Dorsalis pedis and posterior tibial  pulses are palpable  bilaterally.  Capillary return is within normal limits  bilaterally. Temperature is within normal limits  bilaterally.  Neurologic  Senn-Weinstein monofilament wire test within normal limits  bilaterally. Muscle power within normal limits bilaterally.  Nails Thick disfigured discolored nails with subungual debris  from hallux to fifth toes bilaterally. No evidence of bacterial infection or drainage bilaterally.  Orthopedic  No limitations of motion  feet .  No crepitus or effusions noted.  No bony pathology or digital deformities noted.  HAV  B/L.  Skin  normotropic skin with no porokeratosis noted bilaterally.  No signs of infections or ulcers noted.     Onychomycosis  Pain in right toes  Pain in left toes  Consent was obtained for treatment procedures.   Mechanical debridement of nails 1-5  bilaterally performed with a nail nipper.  Filed with dremel without incident.    Return office visit    3 months                  Told patient to return for periodic foot care and evaluation due to potential at risk complications.   Noris Kulinski DPM  

## 2022-01-23 ENCOUNTER — Other Ambulatory Visit: Payer: Self-pay | Admitting: Cardiovascular Disease

## 2022-01-23 ENCOUNTER — Encounter: Payer: Self-pay | Admitting: Internal Medicine

## 2022-01-23 NOTE — Patient Instructions (Addendum)
    Blood work was ordered.      Medications changes include :   none     Please followup in 6 months  

## 2022-01-23 NOTE — Progress Notes (Signed)
Subjective:    Patient ID: Collin [name], male    DOB: Mar 22, 1925, 88 y.o.   MRN: [id number]  This visit occurred during the SARS-CoV-2 public health emergency.  Safety protocols were in place, including screening questions prior to the visit, additional usage of staff PPE, and extensive cleaning of exam room while observing appropriate contact time as indicated for disinfecting solutions.     HPI The patient is here for follow up of their chronic medical problems, including CAD s/p PCI x 3, htn, DM, hld, CKD  He is taking his medications daily as prescribed.  He is exercising regularly.  Overall he feels well and has no concerns.  Medications and allergies reviewed with patient and updated if appropriate.  Patient Active Problem List   Diagnosis Date Noted   Unstable angina ([place])    Shortness of breath 11/01/2019   Thrombocytopenia (HCC)    Skin cancer    Pulmonary nodule    Nocturia    Myocardial infarction (HCC)    Low back pain syndrome    COPD (chronic obstructive pulmonary disease) (HCC)    BPH (benign prostatic hyperplasia)    Bladder cancer (HCC)    Arthritis    CKD (chronic kidney disease), stage III 07/10/2017   Diabetes ([place]) 04/15/2017   Fatty liver 06/18/2016   Rash and nonspecific skin eruption 06/13/2016   H/O bacterial endocarditis 04/06/2016   Myalgia and myositis 12/18/2015   Abnormal CT of the abdomen 12/31/2013   Abnormal chest x-ray 12/31/2013   Gynecomastia    Spinal stenosis, lumbar 05/12/2012   CAD (coronary artery disease)    Carotid artery disease ([place])    CERVICAL RADICULOPATHY, LEFT 12/07/2009   SKIN CANCER, HX OF 12/07/2009   Renal cell carcinoma ([place]) 07/18/2009   ANEMIA, 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   GERD 07/18/2009   Essential hypertension 04/23/2008   Dyslipidemia 01/25/2007   Cancer ([place]) 02/28/2001    Current Outpatient Medications on File Prior to Visit  Medication Sig Dispense Refill   acetaminophen (TYLENOL) 500 MG tablet  Take 500 mg by mouth every 6 (six) hours as needed for moderate pain or headache.      alfuzosin (UROXATRAL) 10 MG 24 hr tablet Take 10 mg by mouth daily.      aspirin EC 81 MG tablet Take 1 tablet (81 mg total) by mouth daily.     carvedilol (COREG) 3.125 MG tablet TAKE 1 TABLET TWICE A DAY 180 tablet 2   clopidogrel (PLAVIX) 75 MG tablet TAKE 1 TABLET DAILY 90 tablet 3   COVID-19 mRNA bivalent vaccine, Moderna, (MODERNA COVID-19 BIVAL BOOSTER) 50 MCG/0.5ML injection Inject into the muscle. 0.5 mL 0   fenofibrate (TRICOR) 48 MG tablet TAKE 1 TABLET DAILY 30 tablet 0   finasteride (PROSCAR) 5 MG tablet Take 5 mg by mouth daily.     fluocinonide ointment (LIDEX) 0.05 % SMARTSIG:1 Sparingly Topical Twice Daily     losartan (COZAAR) 50 MG tablet TAKE 1/2 TABLET (25MG       TOTAL) TWICE A DAY 90 tablet 3   metroNIDAZOLE (METROGEL) 0.75 % gel Apply topically 2 (two) times daily.     Multiple Vitamin (MULTIVITAMIN) tablet Take 1 tablet by mouth daily.     nitroGLYCERIN (NITROSTAT) 0.4 MG SL tablet Place 1 tablet (0.4 mg total) under the tongue every 5 (five) minutes as needed for chest pain. 25 tablet 6   rosuvastatin (CRESTOR) 5 MG tablet Take 5 mg by mouth every other day.  sodium chloride (OCEAN) 0.65 % SOLN nasal spray Place 1 spray into both nostrils as needed for congestion.     triamcinolone ointment (KENALOG) 0.1 % Apply 1 application topically daily.      triamcinolone cream (KENALOG) 0.1 % SMARTSIG:1 Application Topical 2-3 Times Daily     No current facility-administered medications on file prior to visit.    Past Medical History:  Diagnosis Date   Allergy    Anemia    Arthritis    gout   Bladder cancer ([place])    BPH (benign prostatic hyperplasia)    CAD (coronary artery disease) 2005   a. diaphragmatic MI s/p DES to RCA 2005, residual disease. b. [place] 11/2019 s/p DES to PDA, mRCA, mid Cx.   Cancer ([place]) 02/28/2001   Left ureter cancer/bladder   Carcinoma, renal cell ([place])     Cardiomyopathy ([place])    a. EF 45-50% by echo 12/2018.   Carotid artery disease ([place])    Doppler, September, 2010, normal, no carotid artery disease   CKD (chronic kidney disease), stage III (HCC)    COPD (chronic obstructive pulmonary disease) ([place])    Dysfunction of eustachian tube    Ejection fraction    EF 55%, echo, January, 2009   Encounter for long-term (current) use of other medications    Enlarged prostate    GERD (gastroesophageal reflux disease)    Gynecomastia    b/l   HTN (hypertension)    Hx of cystoscopy 11/02/12   negative   Hx of skin cancer, basal cell    Hypercholesterolemia    Hyperlipidemia    Low back pain syndrome    Myocardial infarction ([place])    hx acute   Nocturia    Organic impotence    Pulmonary nodule    Chest CT, followed   Reactive airway disease    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   Staph infection 04/1997   Thrombocytopenia [facility])     Past Surgical History:  Procedure Laterality Date   BACK SURGERY  02/1996   bladder tumors  2003, 2004, 01/2009   removed   CARDIAC CATHETERIZATION     CHOLECYSTECTOMY  07/2003   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   sutured, right   CORNEAL TRANSPLANT  10/2008   left   CORONARY STENT INTERVENTION  11/28/2019   CORONARY STENT INTERVENTION N/A 11/28/2019   Procedure: CORONARY STENT INTERVENTION;  Surgeon: Burnell Blanks, MD;  Location: [facility];  Service: Cardiovascular;  Laterality: N/A;   LEFT HEART CATH AND CORONARY ANGIOGRAPHY N/A 11/27/2019   Procedure: LEFT HEART CATH AND CORONARY ANGIOGRAPHY;  Surgeon: Burnell Blanks, MD;  Location: [facility];  Service: Cardiovascular;  Laterality: N/A;   NEPHRECTOMY  02/2001   left kidney, ureter   osteomyelitis     staph aureus   URETERECTOMY  2003   for cancer    Social History   Socioeconomic History   Marital status: Widowed    Spouse name: Not on file   Number of children: 1   Years of education: Not on  file   Highest education level: Not on file  Occupational History   Occupation: retired    Comment: [employer]  Tobacco Use   Smoking status: Former    Types: Pipe    Quit date: 12/27/1991    Years since quitting: 30.0   Smokeless tobacco: Former   Tobacco comments:    Quit 20-30 years ago as of  2012  Vaping Use   Vaping Use: Never used  Substance and Sexual Activity   Alcohol use: No    Alcohol/week: 0.0 standard drinks   Drug use: No   Sexual activity: Not Currently  Other Topics Concern   Not on file  Social History Narrative   30 minutes a day for 5 days a week   Social Determinants of Health   Financial Resource Strain: Low Risk    Difficulty of Paying Living Expenses: Not hard at all  Food Insecurity: No Food Insecurity   Worried About Charity fundraiser in the Last Year: Never true   Ran Out of Food in the Last Year: Never true  Transportation Needs: No Transportation Needs   Lack of Transportation (Medical): No   Lack of Transportation (Non-Medical): No  Physical Activity: Sufficiently Active   Days of Exercise per Week: 5 days   Minutes of Exercise per Session: 30 min  Stress: No Stress Concern Present   Feeling of Stress : Not at all  Social Connections: Moderately Integrated   Frequency of Communication with Friends and Family: Twice a week   Frequency of Social Gatherings with Friends and Family: Once a week   Attends Religious Services: More than 4 times per year   Active Member of [employer] or Organizations: Yes   Attends Archivist Meetings: More than 4 times per year   Marital Status: Widowed    Family History  Problem Relation Age of Onset   Heart failure Mother 41   Diabetes Mother    Coronary artery disease Brother    Diabetes Brother    Drug abuse Brother    Diabetes Sister    Diabetes Sister     Review of Systems  Constitutional:  Negative for fever.  HENT:  Positive for sneezing.   Respiratory:  Positive for shortness of  breath (exertional - chronic - not worse). Negative for cough and wheezing.   Cardiovascular:  Positive for chest pain (heaviness in chest - esphagus). Negative for palpitations and leg swelling.  Gastrointestinal:  Negative for abdominal pain.       Occ gerd  Neurological:  Negative for light-headedness and headaches.      Objective:   Vitals:   01/24/22 1135  BP: 102/60  Pulse: (!) 54  Temp: 98 F (36.7 C)  SpO2: 97%   BP Readings from Last 3 Encounters:  01/24/22 102/60  07/23/21 116/70  01/25/21 (!) 112/58   Wt Readings from Last 3 Encounters:  01/24/22 171 lb (77.6 kg)  07/23/21 171 lb (77.6 kg)  01/25/21 169 lb (76.7 kg)   Body mass index is 26.78 kg/m.   Physical Exam    Constitutional: Appears well-developed and well-nourished. No distress.  HENT:  Head: Normocephalic and atraumatic.  Neck: Neck supple. No tracheal deviation present. No thyromegaly present.  No cervical lymphadenopathy Cardiovascular: Normal rate, regular rhythm and normal heart sounds.   No murmur heard. No carotid bruit .  No edema Pulmonary/Chest: Effort normal and breath sounds normal. No respiratory distress. No has no wheezes. No rales. Abdomen: Soft, nontender, nondistended Skin: Skin is warm and dry. Not diaphoretic.  Psychiatric: Normal mood and affect. Behavior is normal.      Assessment & Plan:    Prevnar 20 pneumonia vaccine today  See Problem List for Assessment and Plan of chronic medical problems.

## 2022-01-24 ENCOUNTER — Ambulatory Visit (INDEPENDENT_AMBULATORY_CARE_PROVIDER_SITE_OTHER): Payer: Medicare Other | Admitting: Internal Medicine

## 2022-01-24 ENCOUNTER — Other Ambulatory Visit: Payer: Self-pay

## 2022-01-24 VITALS — BP 102/60 | HR 54 | Temp 98.0°F | Ht 67.0 in | Wt 171.0 lb

## 2022-01-24 DIAGNOSIS — E785 Hyperlipidemia, unspecified: Secondary | ICD-10-CM | POA: Diagnosis not present

## 2022-01-24 DIAGNOSIS — I10 Essential (primary) hypertension: Secondary | ICD-10-CM

## 2022-01-24 DIAGNOSIS — N1831 Chronic kidney disease, stage 3a: Secondary | ICD-10-CM

## 2022-01-24 DIAGNOSIS — Z23 Encounter for immunization: Secondary | ICD-10-CM | POA: Diagnosis not present

## 2022-01-24 DIAGNOSIS — N1832 Chronic kidney disease, stage 3b: Secondary | ICD-10-CM

## 2022-01-24 DIAGNOSIS — I2510 Atherosclerotic heart disease of native coronary artery without angina pectoris: Secondary | ICD-10-CM

## 2022-01-24 DIAGNOSIS — E1122 Type 2 diabetes mellitus with diabetic chronic kidney disease: Secondary | ICD-10-CM

## 2022-01-24 LAB — CBC WITH DIFFERENTIAL/PLATELET
Basophils Absolute: 0 10*3/uL (ref 0.0–0.1)
Basophils Relative: 0.8 % (ref 0.0–3.0)
Eosinophils Absolute: 0.2 10*3/uL (ref 0.0–0.7)
Eosinophils Relative: 4 % (ref 0.0–5.0)
HCT: 42.9 % (ref 39.0–52.0)
Hemoglobin: 14.2 g/dL (ref 13.0–17.0)
Lymphocytes Relative: 26.7 % (ref 12.0–46.0)
Lymphs Abs: 1.2 10*3/uL (ref 0.7–4.0)
MCHC: 33 g/dL (ref 30.0–36.0)
MCV: 93.5 fl (ref 78.0–100.0)
Monocytes Absolute: 0.6 10*3/uL (ref 0.1–1.0)
Monocytes Relative: 12.4 % — ABNORMAL HIGH (ref 3.0–12.0)
Neutro Abs: 2.5 10*3/uL (ref 1.4–7.7)
Neutrophils Relative %: 56.1 % (ref 43.0–77.0)
Platelets: 104 10*3/uL — ABNORMAL LOW (ref 150.0–400.0)
RBC: 4.58 Mil/uL (ref 4.22–5.81)
RDW: 13.6 % (ref 11.5–15.5)
WBC: 4.5 10*3/uL (ref 4.0–10.5)

## 2022-01-24 LAB — COMPREHENSIVE METABOLIC PANEL
ALT: 17 U/L (ref 0–53)
AST: 20 U/L (ref 0–37)
Albumin: 4.6 g/dL (ref 3.5–5.2)
Alkaline Phosphatase: 37 U/L — ABNORMAL LOW (ref 39–117)
BUN: 34 mg/dL — ABNORMAL HIGH (ref 6–23)
CO2: 29 mEq/L (ref 19–32)
Calcium: 9.9 mg/dL (ref 8.4–10.5)
Chloride: 102 mEq/L (ref 96–112)
Creatinine, Ser: 1.82 mg/dL — ABNORMAL HIGH (ref 0.40–1.50)
GFR: 31.38 mL/min — ABNORMAL LOW (ref >60.00)
Glucose, Bld: 92 mg/dL (ref 70–99)
Potassium: 4.6 mEq/L (ref 3.5–5.1)
Sodium: 140 mEq/L (ref 135–145)
Total Bilirubin: 1.2 mg/dL (ref 0.2–1.2)
Total Protein: 6.7 g/dL (ref 6.0–8.3)

## 2022-01-24 LAB — LIPID PANEL
Cholesterol: 117 mg/dL (ref 0–200)
HDL: 36.6 mg/dL — ABNORMAL LOW (ref >39.00)
NonHDL: 80.23
Total CHOL/HDL Ratio: 3
Triglycerides: 241 mg/dL — ABNORMAL HIGH (ref 0.0–149.0)
VLDL: 48.2 mg/dL — ABNORMAL HIGH (ref 0.0–40.0)

## 2022-01-24 LAB — HEMOGLOBIN A1C: Hgb A1c MFr Bld: 6.5 % (ref 4.6–6.5)

## 2022-01-24 LAB — LDL CHOLESTEROL, DIRECT: Direct LDL: 53 mg/dL

## 2022-01-24 NOTE — Assessment & Plan Note (Signed)
Chronic Blood pressure well controlled CMP Continue Coreg 3.125 mg twice daily, losartan 25 mg twice daily 

## 2022-01-24 NOTE — Assessment & Plan Note (Signed)
Chronic No symptoms consistent with angina Following with cardiology Continue Crestor 5 mg daily, losartan 25 mg twice daily fenofibrate 48 mg daily, Plavix 75 mg daily, carvedilol 3.125 mg twice daily, aspirin 81 mg daily

## 2022-01-24 NOTE — Assessment & Plan Note (Signed)
Chronic Regular exercise and healthy diet encouraged Check lipid panel  Continue Crestor 5 mg daily 

## 2022-01-24 NOTE — Assessment & Plan Note (Signed)
Chronic Diet controlled He is careful with his diet and eating healthy-compliant with diabetic diet He is exercising regularly Check A1c

## 2022-01-24 NOTE — Assessment & Plan Note (Signed)
Chronic ?Stable ?CMP ?

## 2022-01-25 NOTE — Addendum Note (Signed)
Addended by: Marcina Millard on: 01/25/2022 07:52 AM   Modules accepted: Orders

## 2022-01-26 ENCOUNTER — Other Ambulatory Visit: Payer: Self-pay | Admitting: Cardiovascular Disease

## 2022-02-01 ENCOUNTER — Ambulatory Visit (INDEPENDENT_AMBULATORY_CARE_PROVIDER_SITE_OTHER): Payer: Medicare Other | Admitting: *Deleted

## 2022-02-01 DIAGNOSIS — E785 Hyperlipidemia, unspecified: Secondary | ICD-10-CM

## 2022-02-01 DIAGNOSIS — N1831 Chronic kidney disease, stage 3a: Secondary | ICD-10-CM

## 2022-02-01 DIAGNOSIS — E1122 Type 2 diabetes mellitus with diabetic chronic kidney disease: Secondary | ICD-10-CM

## 2022-02-01 DIAGNOSIS — N1832 Chronic kidney disease, stage 3b: Secondary | ICD-10-CM

## 2022-02-01 NOTE — Chronic Care Management (AMB) (Signed)
Chronic Care Management   CCM RN Visit Note  02/01/2022 Name: Collin [name] MRN: [id number] DOB: [birth year]  Subjective: Collin [name] is a 88 y.o. year old male who is a primary care patient of Burns, Claudina Lick, MD. The care management team was consulted for assistance with disease management and care coordination needs.    Engaged with patient by telephone for follow up visit in response to provider referral for case management and/or care coordination services.   Consent to Services:  The patient was given information about Chronic Care Management services, agreed to services, and gave verbal consent prior to initiation of services.  Please see initial visit note for detailed documentation.  Patient agreed to services and verbal consent obtained.   Assessment: Review of patient past medical history, allergies, medications, health status, including review of consultants reports, laboratory and other test data, was performed as part of comprehensive evaluation and provision of chronic care management services.   SDOH (Social Determinants of Health) assessments and interventions performed:  SDOH Interventions    Flowsheet Row Most Recent Value  SDOH Interventions   Food Insecurity Interventions Intervention Not Indicated  [Continues to deny food insecurity]  Physical Activity Interventions Intervention Not Indicated  [Patient bowls 5 games twice a week and reports stays active on other days]  Transportation Interventions Intervention Not Indicated  [Continues to drive self]      CCM Care Plan  Allergies  Allergen Reactions   Crestor [Rosuvastatin]     Muscle pain   Atorvastatin     Muscle aches    Peanut Oil Rash    sneezing   Trandolapril Cough   Outpatient Encounter Medications as of 02/01/2022  Medication Sig Note   acetaminophen (TYLENOL) 500 MG tablet Take 500 mg by mouth every 6 (six) hours as needed for moderate pain or headache.     alfuzosin (UROXATRAL) 10 MG 24 hr  tablet Take 10 mg by mouth daily.     aspirin EC 81 MG tablet Take 1 tablet (81 mg total) by mouth daily.    carvedilol (COREG) 3.125 MG tablet TAKE 1 TABLET TWICE A DAY    clopidogrel (PLAVIX) 75 MG tablet Take 1 tablet (75 mg total) by mouth daily. Pt needs to make appt with provider for further refills - 1st attempt    COVID-19 mRNA bivalent vaccine, Moderna, (MODERNA COVID-19 BIVAL BOOSTER) 50 MCG/0.5ML injection Inject into the muscle.    fenofibrate (TRICOR) 48 MG tablet TAKE 1 TABLET DAILY    finasteride (PROSCAR) 5 MG tablet Take 5 mg by mouth daily.    fluocinonide ointment (LIDEX) 0.05 % SMARTSIG:1 Sparingly Topical Twice Daily    losartan (COZAAR) 50 MG tablet Take 0.5 tablets (25 mg total) by mouth in the morning and at bedtime. Pt needs to make appt with provider for further refills - 1st attempt    metroNIDAZOLE (METROGEL) 0.75 % gel Apply topically 2 (two) times daily.    Multiple Vitamin (MULTIVITAMIN) tablet Take 1 tablet by mouth daily.    nitroGLYCERIN (NITROSTAT) 0.4 MG SL tablet Place 1 tablet (0.4 mg total) under the tongue every 5 (five) minutes as needed for chest pain.    rosuvastatin (CRESTOR) 5 MG tablet Take 5 mg by mouth every other day. 02/01/2022: 02/01/22- reports now taking QD   sodium chloride (OCEAN) 0.65 % SOLN nasal spray Place 1 spray into both nostrils as needed for congestion.    triamcinolone cream (KENALOG) 0.1 % SMARTSIG:1 Application Topical 2-3 Times Daily  triamcinolone ointment (KENALOG) 0.1 % Apply 1 application topically daily.     No facility-administered encounter medications on file as of 02/01/2022.   Patient Active Problem List   Diagnosis Date Noted   Unstable angina (HCC)    Shortness of breath 11/01/2019   Thrombocytopenia (HCC)    Skin cancer    Pulmonary nodule    Nocturia    Myocardial infarction (HCC)    Low back pain syndrome    COPD (chronic obstructive pulmonary disease) (HCC)    BPH (benign prostatic hyperplasia)    Bladder  cancer (HCC)    Arthritis    CKD (chronic kidney disease), stage III 07/10/2017   Diabetes ([place]) 04/15/2017   Fatty liver 06/18/2016   Rash and nonspecific skin eruption 06/13/2016   H/O bacterial endocarditis 04/06/2016   Myalgia and myositis 12/18/2015   Abnormal CT of the abdomen 12/31/2013   Abnormal chest x-ray 12/31/2013   Gynecomastia    Spinal stenosis, lumbar 05/12/2012   CAD (coronary artery disease)    Carotid artery disease (HCC)    CERVICAL RADICULOPATHY, LEFT 12/07/2009   SKIN CANCER, HX OF 12/07/2009   Renal cell carcinoma ([place]) 07/18/2009   ANEMIA, 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   GERD 07/18/2009   Essential hypertension 04/23/2008   Dyslipidemia 01/25/2007   Cancer ([place]) 02/28/2001   Conditions to be addressed/monitored:  HLD, DMII, and CKD Stage III  Care Plan : RN Care Manager Plan of Care  Updates made by Knox Royalty, RN since 02/01/2022 12:00 AM     Problem: Chronic Disease Management Needs   Priority: High     Long-Range Goal: Ongoing adherence to established plan of care for long term chronic disease management   Start Date: 11/16/2021  Expected End Date: 11/16/2022  Priority: High  Note:   Current Barriers:  Chronic Disease Management support and education needs related to HLD and DMII  RNCM Clinical Goal(s):  Patient will demonstrate ongoing health management independence as evidenced by DMII; HLD        through collaboration with RN Care manager, provider, and care team.   Interventions: 1:1 collaboration with primary care provider regarding development and update of comprehensive plan of care as evidenced by provider attestation and co-signature Inter-disciplinary care team collaboration (see longitudinal plan of care) Evaluation of current treatment plan related to  self management and patient's adherence to plan as established by provider SDOH updated: No new/ unmet needs identified Pain Assessment updated: continues to deny acute/ chronic  pain Falls assessment updated: continues to deny new/ recent falls; no falls x > 12 months; does not use assistive devices; previously provided education around fall risks/ prevention reinforced Reviewed with patient recent PCP office visit 01/24/22: Patient denies questions/ concerns post- office visit Confirmed no medication changes- patient denies medication concerns and reports he continues to independently self-manage medications Reviewed with patient recent lab results: he reports ongoing concern with declining kidney function; reports he has only one kidney and he wants to preserve function as best as possible; we discussed basics of CKD; I will provide additional printed educational material via [place]  Diabetes:  (Status: 02/01/22: Goal on Track (progressing): YES.) Long Term Goal   Lab Results  Component Value Date   HGBA1C 6.5 01/24/2022  Provided education to patient about basic DM disease process; Reviewed prescribed diet with patient heart healthy, low sodium, low cholesterol, carbohydrate modified/ low sugar; Discussed plans with patient for ongoing care management follow up and provided patient with direct contact information for  care management team;      Reviewed scheduled/upcoming provider appointments including: 04/13/22-- podiatry; 07/26/22-- PCP; discussed with patient need to schedule cardiology provider follow up visit-- he reports he was planning to do this this afternoon;         Review of patient status, including review of consultants reports, relevant laboratory and other test results, and medications completed;       Reviewed individual historical A1-C trends and provided education around correlation of A1-C value to blood sugar levels at home over 3 months Confirmed patient stays active- bowls regularly-- reports bowled 5 games yesterday; benefits of activity/ regular exercise discussed with patient Discussed CKD- III diagnosis with patient and reviewed most recent labs  for renal function; provided basic education, as above Confirmed patient adherent to prescribed diet: positive reinforcement provided with encouragement to continue efforts Confirmed patient obtains regular foot care through podiatry provider; denies questions/ concerns around foot care  Hyperlipidemia:  (Status: 02/01/22: New goal.) Long Term Goal   Lab Results  Component Value Date   CHOL 117 01/24/2022   HDL 36.60 (L) 01/24/2022   [place] 53 01/18/2021   LDLDIRECT 53.0 01/24/2022   TRIG 241.0 (H) 01/24/2022   CHOLHDL 3 01/24/2022    Counseled on importance of regular laboratory monitoring as prescribed; Reviewed role and benefits of statin for ASCVD risk reduction; Reviewed importance of limiting foods high in cholesterol; Reviewed exercise goals and target of 150 minutes per week; Confirmed patient now taking Rosuvastatin 5 mg every day; he had previously reported taking only every other day per instruction of cardiologist- medication list updated accordingly Confirmed patient monitors blood pressures at home daily: he reports general consistent ranges between 120-130/ 58-60;   Patient Goals/Self-Care Activities: As evidenced by review of EHR, collaboration with care team, and patient reporting during CCM RN CM outreach,  Patient [place] will: Take medications as prescribed Attend all scheduled provider appointments Call pharmacy for medication refills Call provider office for new concerns or questions Continue to follow heart healthy, low salt, low cholesterol, carbohydrate-modified, low sugar diet Keep up the great work staying active and preventing falls Review the attached educational material about kidney function    Plan: Telephone follow up appointment with care management team member scheduled for:  Monday, August 01, 2022 at 3:00 pm The patient has been provided with contact information for the care management team and has been advised to call with any health related  questions or concerns  Oneta Rack, RN, BSN, [place] [phone]: direct office

## 2022-02-01 NOTE — Patient Instructions (Signed)
Visit Information  Collin [name], thank you for taking time to talk with me today. Please don't hesitate to contact me if I can be of assistance to you before our next scheduled telephone appointment.  Below are the goals we discussed today:  Patient Self-Care Activities: Patient Collin [name] will: Take medications as prescribed Attend all scheduled provider appointments Call pharmacy for medication refills Call provider office for new concerns or questions Continue to follow heart healthy, low salt, low cholesterol, carbohydrate-modified, low sugar diet Keep up the great work staying active and preventing falls Review the attached educational material about kidney function  Our next scheduled telephone follow up visit/ appointment with care management team member is scheduled on:   Monday, August 01, 2022 at 3:00 pm- This is a PHONE [place] appointment  If you need to cancel or re-schedule our visit, please call [phone] and our care guide team will be happy to assist you.   I look forward to hearing about your progress.   Oneta Rack, RN, BSN, [place] [phone]: direct office  If you are experiencing a Mental Health or [place] or need someone to talk to, please  call the Suicide and Crisis Lifeline: 988 call the [place] National Suicide Prevention Lifeline: 631-088-6559 or TTY: (203) 130-9041 TTY 971-606-3076) to talk to a trained counselor call 1-800-273-TALK (toll free, 24 hour hotline) go to [facility] [address] [place] [phone]) call 911   Patient verbalizes understanding of instructions and care plan provided today and agrees to view in [place]. Active MyChart status confirmed with patient  Chronic Kidney Disease, Adult Chronic kidney disease is when lasting damage happens to the kidneys slowly over a long time. The kidneys help to: Make pee  (urine). Make hormones. Keep the right amount of fluids and chemicals in the body. Most often, this disease does not go away. You must take steps to help keep the kidney damage from getting worse. If steps are not taken, the kidneys might stop working forever. What are the causes? Diabetes. High blood pressure. Diseases that affect the heart and blood vessels. Other kidney diseases. Diseases of the body's disease-fighting system. A problem with the flow of pee. Infections of the organs that make pee, store it, and take it out of the body. Swelling or irritation of your blood vessels. What increases the risk? Getting older. Having someone in your family who has kidney disease or kidney failure. Having a disease caused by genes. Taking medicines often that harm the kidneys. Being near or having contact with harmful substances. Being very overweight. Using tobacco now or in the past. What are the signs or symptoms? Feeling very tired. Having a swollen face, legs, ankles, or feet. Feeling like you may vomit or vomiting. Not feeling hungry. Being confused or not able to focus. Twitches and cramps in the leg muscles or other muscles. Dry, itchy skin. A taste of metal in your mouth. Making less pee, or making more pee. Shortness of breath. Trouble sleeping. You may also become anemic or get weak bones. Anemic means there is not enough red blood cells or hemoglobin in your blood. You may get symptoms slowly. You may not notice them until the kidney damage gets very bad. How is this treated? Often, there is no cure for this disease. Treatment can help with symptoms and help keep the disease from getting worse. You may need to: Avoid alcohol. Avoid foods that are  high in salt, potassium, phosphorous, and protein. Take medicines for symptoms and to help control other conditions. Have dialysis. This treatment gets harmful waste out of your body. Treat other problems that cause your  kidney disease or make it worse. Follow these instructions at home: Medicines Take over-the-counter and prescription medicines only as told by your doctor. Do not take any new medicines, vitamins, or supplements unless your doctor says it is okay. Lifestyle  Do not smoke or use any products that contain nicotine or tobacco. If you need help quitting, ask your doctor. If you drink alcohol: Limit how much you use to: 0-1 drink a day for women who are not pregnant. 0-2 drinks a day for men. Know how much alcohol is in your drink. In the [place] one drink equals one 12 oz bottle of beer (355 mL), one 5 oz glass of wine (148 mL), or one 1 oz glass of hard liquor (44 mL). Stay at a healthy weight. If you need help losing weight, ask your doctor. General instructions  Follow instructions from your doctor about what you cannot eat or drink. Track your blood pressure at home. Tell your doctor about any changes. If you have diabetes, track your blood sugar. Exercise at least 30 minutes a day, 5 days a week. Keep your shots (vaccinations) up to date. Keep all follow-up visits. Where to find more information American Association of Kidney Patients: BombTimer.gl National Kidney Foundation: www.kidney.[place]: https://mathis.com/ Life Options: www.lifeoptions.org Kidney School: www.kidneyschool.org Contact a doctor if: Your symptoms get worse. You get new symptoms. Get help right away if: You get symptoms of end-stage kidney disease. These include: Headaches. Losing feeling in your hands or feet. Easy bruising. Having hiccups often. Chest pain. Shortness of breath. Lack of menstrual periods, in women. You have a fever. You make less pee than normal. You have pain or you bleed when you pee or poop. These symptoms may be an emergency. Get help right away. Call your local emergency services (911 in the [place] Do not wait to see if the symptoms will go away. Do not drive  yourself to the hospital. Summary Chronic kidney disease is when lasting damage happens to the kidneys slowly over a long time. Causes of this disease include diabetes and high blood pressure. Often, there is no cure for this disease. Treatment can help symptoms and help keep the disease from getting worse. Treatment may involve lifestyle changes, medicines, and dialysis. This information is not intended to replace advice given to you by your health care provider. Make sure you discuss any questions you have with your health care provider. Document Revised: 03/18/2020 Document Reviewed: 03/18/2020 Elsevier Patient Education  [place] for Chronic Kidney Disease Chronic kidney disease (CKD) occurs when the kidneys are permanently damaged over a long period of time. When your kidneys are not working well, they cannot remove waste, fluids, and other substances from your blood as well as they did before. The substances can build up, which can worsen kidney damage and affect how your body functions. Certain foods lead to a buildup of these substances. By changing your diet, you can help prevent more kidney damage and delay or prevent the need for dialysis. What are tips for following this plan? Reading food labels Check the amount of salt (sodium) in foods. Choose foods that have less than 300 milligrams (mg) per serving. Check the ingredient list for phosphorus or potassium-based additives or preservatives. Check the amount  of saturated fat and trans fat. Limit or avoid these fats as told by your dietitian. Shopping Avoid buying foods that are: Processed or prepackaged. Calcium-enriched or that have calcium added to them (are fortified). Do not buy foods that have salt or sodium listed among the first five ingredients. Buy canned vegetables and beans that say "no salt added" or "low sodium" and rinse them before eating. Cooking Soak vegetables, such as potatoes, before  cooking to reduce potassium. To do this: Peel and cut the vegetables into small pieces. Soak the vegetables in warm water for at least 2 hours. For every 1 cup of vegetables, use 10 cups of water. Drain and rinse the vegetables with warm water. Boil the vegetables for at least 5 minutes. Meal planning Limit the amount of protein you eat from plant and animal sources each day. Do not add salt to food when cooking or before eating. Eat meals and snacks at around the same time each day. General information Talk with your health care provider about whether you should take a vitamin and mineral supplement. Use standard measuring cups and spoons to measure servings of foods. Use a kitchen scale to measure portions of protein foods. If told by your health care provider, avoid drinking too much fluid. Measure and count all liquids, including water, ice, soups, flavored gelatin, and frozen desserts such as ice pops or ice cream. If you have diabetes: If you have diabetes (diabetes mellitus) and CKD, it is important to keep your blood sugar (glucose) in the target range recommended by your health care provider. Follow your diabetes management plan. This may include: Checking your blood glucose regularly. Taking medicines by mouth, taking insulin, or taking both. Exercising for at least 30 minutes on 5 or more days each week, or as told by your health care provider. Tracking how many servings of carbohydrates you eat at each meal. You may be given specific guidelines on how much of certain foods and nutrients you may eat, depending on your stage of kidney disease and whether you have high blood pressure (hypertension). Follow your meal plan as told by your dietitian. What nutrients should I limit? Work with your health care provider and dietitian to develop a meal plan that is right for you. Foods you can eat and foods you should limit or avoid will depend on the stage of your kidney disease and any other  health conditions you have. The items listed below are not a complete list. Talk with your dietitian about what dietary choices are best for you. Potassium Potassium affects how steadily your heart beats. If too much potassium builds up in your blood, the potassium can cause an irregular heartbeat or even a heart attack. You may need to limit or avoid foods that are high in potassium, such as: Milk and soy milk. Fruits, such as bananas, apricots, nectarines, melon, prunes, raisins, kiwi, and oranges. Vegetables, such as potatoes, sweet potatoes, yams, tomatoes, leafy greens, beets, avocado, pumpkin, and winter squash. White and lima beans. Whole-wheat breads and pastas. Beans and nuts. Phosphorus Phosphorus is a mineral found in your bones. A balance between calcium and phosphorus is needed to build and maintain healthy bones. Too much phosphorus pulls calcium from your bones. This can make your bones weak and more likely to break. Too much phosphorus can also make your skin itch. You may need to limit or avoid foods that are high in phosphorus, such as: Milk and dairy products. Dried beans and peas. Tofu, soy  milk, and other soy-based meat replacements. Dark-colored sodas. Nuts and peanut butter. Meat, poultry, and fish. Bran cereals and oatmeal. Protein Protein helps you make and keep muscle. It also helps to repair your body's cells and tissues. One of the natural breakdown products of protein is a waste product called urea. When your kidneys are not working properly, they cannot remove wastes, such as urea. Reducing how much protein you eat can help prevent a buildup of urea in your blood. Depending on your stage of kidney disease, you may need to limit foods that are high in protein. Sources of animal protein include: Meat (all types). Fish and seafood. Poultry. Eggs. Dairy. Other protein foods include: Beans and legumes. Nuts and nut butter. Soy and tofu.  Sodium Sodium helps  to maintain a healthy balance of fluids in your body. Too much sodium can increase your blood pressure and have a negative effect on your heart and lungs. Too much sodium can also cause your body to retain too much fluid, making your kidneys work harder. Most people should have less than 2,300 mg of sodium each day. If you have hypertension, you may need to limit your sodium to 1,500 mg each day. You may need to limit or avoid foods that are high in sodium, such as: Salt seasonings. Soy sauce. Cured and processed meats. Salted crackers and snack foods. Fast food. Canned soups and most canned foods. Pickled foods. Vegetable juice. Boxed mixes or ready-to-eat boxed meals and side dishes. Bottled dressings, sauces, and marinades. Talk with your dietitian about how much potassium, phosphorus, protein, and sodium you may have each day. Summary Chronic kidney disease (CKD) can lead to a buildup of waste and extra substances in the body. Certain foods lead to a buildup of these substances. By changing your diet as told, you can help prevent more kidney damage and delay or prevent the need for dialysis. Food intake changes are different for each person with CKD. Work with a dietitian to set up nutrient goals and a meal plan that is right for you. If you have diabetes and CKD, it is important to keep your blood sugar in the target range recommended by your health care provider. This information is not intended to replace advice given to you by your health care provider. Make sure you discuss any questions you have with your health care provider. Document Revised: 04/06/2020 Document Reviewed: 04/06/2020 Elsevier Patient Education  2022 [employer].

## 2022-02-10 ENCOUNTER — Encounter: Payer: Self-pay | Admitting: Cardiovascular Disease

## 2022-02-10 NOTE — Progress Notes (Signed)
Cardiology Office Note   Date:  02/11/2022   ID:  Collin [name], Collin [name] 10/31/1925, MRN [id number]  PCP:  Binnie Rail, MD  Cardiologist:  Mertie Moores, MD , previous patient of Dr. Ron Parker  Chief Complaint  Patient presents with   Coronary Artery Disease        Hyperlipidemia   Problem List 1. CAD- s/p stenting in 2005.  2. Essential HTN       Collin [name] is a 88 y.o. male who presents for follow-up of coronary disease.  Had a stent placed by Dr. Olevia Perches in 2005.  No CP , no dyspnea   Previously worked  in the [employer] organ[employer] for [employer] active Walks 30 minutes a day on the treadmill.   Goes bowling twice a week Took Atorvastatin for years but stopped due to leg pain .  Last lipids were drawn in Dec.  - showed markedly elevated Trig levels and high chol levels.   April 21, 2017:  Collin [name] has a hx of CAD Hyperlipidemia Was recently diagnosed with DM and He has greatly improved his diet. He's cut back on lots of carbohydrates. He's lost about 5 pounds. He's noticed that his blood pressure has been low. This morning he took only half of his valsartan tablet.  Takes SBE prophylasix - has done so for years.  No prosthetic valve and no artificial joints  Jan. 8, 2019:  Collin [name] is seen back for follow-up visit for his coronary artery disease and diabetes mellitus.   He walks on the treadmill 4-5 times a week for 30 minutes at a time.  He denies any angina while walking the treadmill.  He still bowls once or twice a week.  He notes that his blood pressure has been somewhat variable.  It seems to go up in the evenings.  Eats prepared meals every meal.      July 24, 2018:    Collin [name] is seen today for follow up of his CAD Has pain in his calves with walking -  Resolves with walking  Had a [facility] screening of PAD screening  No CP  Still works out on the treadmill, bowl one a week   Recent lab work shows that his triglyceride level is markedly  elevated at 613.  Total cholesterol is 179.  His LDL is 77. Eats lots of cereal, through the day .     January 21, 2019:  Strained his chest recently while lifting a large box Has had some shortness of breath  Saw his primary   Echo shows a mildly depressed left ventricular systolic function with an ejection fraction of 45 to 50%.  There is hypokinesis of the inferior wall.  He has grade 1 diastolic dysfunction.  He has mild mitral regurgitation. The pain was clearly MSK.    Feeling better.   Breathing seems to be better .   November 20, 2019:   Collin [name] is seen today for a follow-up visit. He was recently seen by [name], PA with symptoms consistent with unstable angina.  He wanted to discuss it further and is seeing me today for further evaluation.  Pain / heaviness  - center of chest  Worse with walking or carrying groceries from car Had a stent in 2005.  This current pain is similar to his angina .  No diaphoresis,  No syncope.  No presyncope  Last for several minutes.  Or until he stops to  rest  Was raking leaves for 20 min and had angina   January 22, 2020:  Collin [name] seen back for a follow-up visit.  He was complaining of angina when I saw him back in November, 2020. He was found to have significant disease in his right coronary artery and circumflex artery.  He had a stent placed in his mid right coronary artery as well as the posterior descending artery.  He also had a stent placed in the mid circumflex artery. The left anterior descending artery has a moderate to severe stenosis that has been treated medically. He is greatly improved.  The chest pressure has resolved.  Has not had to take any NTG. Still has some dyspnea.  Walks on the treadmill without any issues.   Was not able to do cardiac rehab due to covid.   He saw Richardson Dopp, PA on January 4.  His renal function was fairly stable.  Creatinine was 1.72.  Potassium is 4.5.  Total cholesterol is 118.  HDL is 42.   Triglyceride levels 223.  The LDL is 41.   July 27, 2020:  Collin [name] is seen today for follow-up of his coronary artery disease.  He has had a stent placed in his right coronary artery as well as the posterior descending artery.  He also has a stent in his left circumflex artery. Looks great for 93.   Has been  out bowling this am.   Is very active  Breathing is good Would like to try a glass of wine at night - I gave the [facility]    Jan. 31, 2022: Collin [name] is seen for follow up of his CAD, Still using his treadmill,  Goes bowling  Has some chest soreness if he walks for a long time  Might last 1-2 minutes.   Has not worsened in a long time  We discussed symptoms that would likely warrant a close look ( pain lasting lnger, occurring sooner, more severe pain )   His trigs are elevated.   Will start fenofibrate 48 mg a day   LDL looks good   Feb. 17, 2023: Collin [name] is seen today for follow up of his CAD Overall doing well.   Has noticed some generalized weakness in his legs.   Past Medical History:  Diagnosis Date   Allergy    Anemia    Arthritis    gout   Bladder cancer ([place])    BPH (benign prostatic hyperplasia)    CAD (coronary artery disease) 2005   a. diaphragmatic MI s/p DES to RCA 2005, residual disease. b. [place] 11/2019 s/p DES to PDA, mRCA, mid Cx.   Cancer ([place]) 02/28/2001   Left ureter cancer/bladder   Carcinoma, renal cell ([place])    Cardiomyopathy ([place])    a. EF 45-50% by echo 12/2018.   Carotid artery disease ([place])    Doppler, September, 2010, normal, no carotid artery disease   CKD (chronic kidney disease), stage III (HCC)    COPD (chronic obstructive pulmonary disease) ([place])    Dysfunction of eustachian tube    Ejection fraction    EF 55%, echo, January, 2009   Encounter for long-term (current) use of other medications    Enlarged prostate    GERD (gastroesophageal reflux disease)    Gynecomastia    b/l   HTN (hypertension)    Hx of cystoscopy 11/02/12   negative    Hx of skin cancer, basal cell    Hypercholesterolemia  Hyperlipidemia    Low back pain syndrome    Myocardial infarction [facility])    hx acute   Nocturia    Organic impotence    Pulmonary nodule    Chest CT, followed   Reactive airway disease    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   Staph infection 04/1997   Thrombocytopenia [facility])     Past Surgical History:  Procedure Laterality Date   BACK SURGERY  02/1996   bladder tumors  2003, 2004, 01/2009   removed   CARDIAC CATHETERIZATION     CHOLECYSTECTOMY  07/2003   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   sutured, right   CORNEAL TRANSPLANT  10/2008   left   CORONARY STENT INTERVENTION  11/28/2019   CORONARY STENT INTERVENTION N/A 11/28/2019   Procedure: CORONARY STENT INTERVENTION;  Surgeon: Burnell Blanks, MD;  Location: [facility];  Service: Cardiovascular;  Laterality: N/A;   LEFT HEART CATH AND CORONARY ANGIOGRAPHY N/A 11/27/2019   Procedure: LEFT HEART CATH AND CORONARY ANGIOGRAPHY;  Surgeon: Burnell Blanks, MD;  Location: [facility];  Service: Cardiovascular;  Laterality: N/A;   NEPHRECTOMY  02/2001   left kidney, ureter   osteomyelitis     staph aureus   URETERECTOMY  2003   for cancer    Patient Active Problem List   Diagnosis Date Noted   CAD (coronary artery disease)     Priority: High   Essential hypertension 04/23/2008    Priority: High   Dyslipidemia 01/25/2007    Priority: High   Unstable angina (HCC)    Shortness of breath 11/01/2019   Thrombocytopenia (HCC)    Skin cancer    Pulmonary nodule    Nocturia    Myocardial infarction (HCC)    Low back pain syndrome    COPD (chronic obstructive pulmonary disease) (HCC)    BPH (benign prostatic hyperplasia)    Bladder cancer (HCC)    Arthritis    CKD (chronic kidney disease), stage III 07/10/2017   Diabetes ([place]) 04/15/2017   Fatty liver 06/18/2016   Rash and nonspecific skin eruption 06/13/2016   H/O bacterial  endocarditis 04/06/2016   Myalgia and myositis 12/18/2015   Abnormal CT of the abdomen 12/31/2013   Abnormal chest x-ray 12/31/2013   Gynecomastia    Spinal stenosis, lumbar 05/12/2012   Carotid artery disease ([place])    CERVICAL RADICULOPATHY, LEFT 12/07/2009   SKIN CANCER, HX OF 12/07/2009   Renal cell carcinoma ([place]) 07/18/2009   ANEMIA, 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   GERD 07/18/2009   Cancer ([place]) 02/28/2001      Current Outpatient Medications  Medication Sig Dispense Refill   acetaminophen (TYLENOL) 500 MG tablet Take 500 mg by mouth every 6 (six) hours as needed for moderate pain or headache.      alfuzosin (UROXATRAL) 10 MG 24 hr tablet Take 10 mg by mouth daily.      aspirin EC 81 MG tablet Take 1 tablet (81 mg total) by mouth daily.     carvedilol (COREG) 3.125 MG tablet TAKE 1 TABLET TWICE A DAY 180 tablet 2   clopidogrel (PLAVIX) 75 MG tablet Take 1 tablet (75 mg total) by mouth daily. Pt needs to make appt with provider for further refills - 1st attempt 30 tablet 0   fenofibrate (TRICOR) 48 MG tablet TAKE 1 TABLET DAILY 30 tablet 0   finasteride (PROSCAR) 5 MG tablet Take 5 mg by mouth daily.     fluocinonide ointment (  LIDEX) 0.05 % SMARTSIG:1 Sparingly Topical Twice Daily     losartan (COZAAR) 50 MG tablet Take 0.5 tablets (25 mg total) by mouth in the morning and at bedtime. Pt needs to make appt with provider for further refills - 1st attempt 30 tablet 0   metroNIDAZOLE (METROGEL) 0.75 % gel Apply topically 2 (two) times daily.     Multiple Vitamin (MULTIVITAMIN) tablet Take 1 tablet by mouth daily.     nitroGLYCERIN (NITROSTAT) 0.4 MG SL tablet Place 1 tablet (0.4 mg total) under the tongue every 5 (five) minutes as needed for chest pain. 25 tablet 6   rosuvastatin (CRESTOR) 5 MG tablet Take 5 mg by mouth every other day.     sodium chloride (OCEAN) 0.65 % SOLN nasal spray Place 1 spray into both nostrils as needed for congestion.     triamcinolone cream (KENALOG) 0.1 %  SMARTSIG:1 Application Topical 2-3 Times Daily     triamcinolone ointment (KENALOG) 0.1 % Apply 1 application topically daily.      COVID-19 mRNA bivalent vaccine, Moderna, (MODERNA COVID-19 BIVAL BOOSTER) 50 MCG/0.5ML injection Inject into the muscle. (Patient not taking: Reported on 02/11/2022) 0.5 mL 0   No current facility-administered medications for this visit.    Allergies:   Crestor [rosuvastatin], Atorvastatin, Peanut oil, and Trandolapril    Social History:  The patient  reports that he quit smoking about 30 years ago. His smoking use included pipe. He has quit using smokeless tobacco. He reports that he does not drink alcohol and does not use drugs.   Family History:  The patient's family history includes Coronary artery disease in his brother; Diabetes in his brother, mother, sister, and sister; Drug abuse in his brother; Heart failure (age of onset: 23) in his mother.    ROS:  Please see the history of present illness.  Patient denies fever, chills, headache, sweats, rash, change in vision, change in hearing, chest pain, cough, nausea or vomiting, urinary symptoms. All other systems are reviewed and are negative.     Physical Exam: Blood pressure 122/70, pulse 63, height 5\' 7"  (1.702 m), weight 169 lb (76.7 kg), SpO2 91 %.  GEN:  Well nourished, well developed in no acute distress HEENT: Normal NECK: No JVD; No carotid bruits LYMPHATICS: No lymphadenopathy CARDIAC: RRR , no murmurs, rubs, gallops RESPIRATORY:  Clear to auscultation without rales, wheezing or rhonchi  ABDOMEN: Soft, non-tender, non-distended MUSCULOSKELETAL:  No edema; No deformity  SKIN: Warm and dry NEUROLOGIC:  Alert and oriented x 3    EKG:     Feb. 17, 2023:  NSR .  No ST or T wave changes.   Recent Labs: 01/24/2022: ALT 17; BUN 34; Creatinine, Ser 1.82; Hemoglobin 14.2; Platelets 104.0; Potassium 4.6; Sodium 140    Lipid Panel    Component Value Date/Time   CHOL 117 01/24/2022 1230   CHOL  139 01/18/2021 0940   TRIG 241.0 (H) 01/24/2022 1230   HDL 36.60 (L) 01/24/2022 1230   HDL 39 (L) 01/18/2021 0940   CHOLHDL 3 01/24/2022 1230   VLDL 48.2 (H) 01/24/2022 1230   LDLCALC 53 01/18/2021 0940   LDLDIRECT 53.0 01/24/2022 1230      Wt Readings from Last 3 Encounters:  02/11/22 169 lb (76.7 kg)  01/24/22 171 lb (77.6 kg)  07/23/21 171 lb (77.6 kg)      Current medicines are reviewed. The patient has a very good understanding of his medications. No changes are to be made.  ASSESSMENT AND PLAN:  1. CAD-  no angina ,  stable  Cont meds  Has some leg cramps with walking .   His primary performed ABIs last year which were reportedly normal.     2. Essential HTN-  BP is normal    3. Hyperlipidemia -    .  4.  Chronic kidney disease:   .    Mertie Moores, MD  02/11/2022 10:44 AM    [address] [place],  [place], [place]  [address] Pager [phone] Phone: [phone]; Fax: [phone]

## 2022-02-11 ENCOUNTER — Encounter: Payer: Self-pay | Admitting: Cardiovascular Disease

## 2022-02-11 ENCOUNTER — Other Ambulatory Visit: Payer: Self-pay

## 2022-02-11 ENCOUNTER — Ambulatory Visit (INDEPENDENT_AMBULATORY_CARE_PROVIDER_SITE_OTHER): Payer: Medicare Other | Admitting: Cardiovascular Disease

## 2022-02-11 VITALS — BP 122/70 | HR 63 | Ht 67.0 in | Wt 169.0 lb

## 2022-02-11 DIAGNOSIS — I2510 Atherosclerotic heart disease of native coronary artery without angina pectoris: Secondary | ICD-10-CM

## 2022-02-11 NOTE — Patient Instructions (Signed)
Medication Instructions:  Your physician recommends that you continue on your current medications as directed. Please refer to the Current Medication list given to you today.  *If you need a refill on your cardiac medications before your next appointment, please call your pharmacy*   Lab Work: NONE If you have labs (blood work) drawn today and your tests are completely normal, you will receive your results only by: [place] (if you have MyChart) OR A paper copy in the mail If you have any lab test that is abnormal or we need to change your treatment, we will call you to review the results.   Testing/Procedures: NONE   Follow-Up: At [facility], you and your health needs are our priority.  As part of our continuing mission to provide you with exceptional heart care, we have created designated Provider Care Teams.  These Care Teams include your primary Cardiologist (physician) and Advanced Practice Providers (APPs -  Physician Assistants and Nurse Practitioners) who all work together to provide you with the care you need, when you need it.  We recommend signing up for the patient portal called "MyChart".  Sign up information is provided on this After Visit Summary.  MyChart is used to connect with patients for Virtual Visits (Telemedicine).  Patients are able to view lab/test results, encounter notes, upcoming appointments, etc.  Non-urgent messages can be sent to your provider as well.   To learn more about what you can do with MyChart, go to [email or web address].    Your next appointment:   1 year(s)  The format for your next appointment:   In Person  Provider:   Mertie Moores, MD, or PA or NP

## 2022-02-17 ENCOUNTER — Other Ambulatory Visit: Payer: Self-pay | Admitting: Cardiovascular Disease

## 2022-02-22 DIAGNOSIS — N1831 Chronic kidney disease, stage 3a: Secondary | ICD-10-CM | POA: Diagnosis not present

## 2022-02-22 DIAGNOSIS — E1122 Type 2 diabetes mellitus with diabetic chronic kidney disease: Secondary | ICD-10-CM | POA: Diagnosis not present

## 2022-02-22 DIAGNOSIS — E785 Hyperlipidemia, unspecified: Secondary | ICD-10-CM

## 2022-02-25 ENCOUNTER — Other Ambulatory Visit: Payer: Self-pay | Admitting: Cardiovascular Disease

## 2022-04-10 ENCOUNTER — Other Ambulatory Visit: Payer: Self-pay | Admitting: Cardiovascular Disease

## 2022-04-13 ENCOUNTER — Encounter: Payer: Self-pay | Admitting: Podiatry

## 2022-04-13 ENCOUNTER — Ambulatory Visit (INDEPENDENT_AMBULATORY_CARE_PROVIDER_SITE_OTHER): Payer: Medicare Other | Admitting: Podiatry

## 2022-04-13 DIAGNOSIS — E119 Type 2 diabetes mellitus without complications: Secondary | ICD-10-CM

## 2022-04-13 DIAGNOSIS — N1832 Chronic kidney disease, stage 3b: Secondary | ICD-10-CM

## 2022-04-13 DIAGNOSIS — D689 Coagulation defect, unspecified: Secondary | ICD-10-CM

## 2022-04-13 DIAGNOSIS — M79674 Pain in right toe(s): Secondary | ICD-10-CM

## 2022-04-13 DIAGNOSIS — M79675 Pain in left toe(s): Secondary | ICD-10-CM

## 2022-04-13 DIAGNOSIS — B351 Tinea unguium: Secondary | ICD-10-CM | POA: Diagnosis not present

## 2022-04-13 NOTE — Progress Notes (Signed)
This patient returns to my office for at risk foot care.  This patient requires this care by a professional since this patient will be at risk due to having thrombocytopenia, CKD, and diabetes.  This patient is unable to cut nails himself since the patient cannot reach his nails.These nails are painful walking and wearing shoes.  This patient presents for at risk foot care today. . Patient is taking plavix.  General Appearance  Alert, conversant and in no acute stress.  Vascular  Dorsalis pedis and posterior tibial  pulses are palpable  bilaterally.  Capillary return is within normal limits  bilaterally. Temperature is within normal limits  bilaterally.  Neurologic  Senn-Weinstein monofilament wire test within normal limits  bilaterally. Muscle power within normal limits bilaterally.  Nails Thick disfigured discolored nails with subungual debris  from hallux to fifth toes bilaterally. No evidence of bacterial infection or drainage bilaterally.  Orthopedic  No limitations of motion  feet .  No crepitus or effusions noted.  No bony pathology or digital deformities noted.  HAV  B/L.  Skin  normotropic skin with no porokeratosis noted bilaterally.  No signs of infections or ulcers noted.     Onychomycosis  Pain in right toes  Pain in left toes  Consent was obtained for treatment procedures.   Mechanical debridement of nails 1-5  bilaterally performed with a nail nipper.  Filed with dremel without incident.    Return office visit    3 months                  Told patient to return for periodic foot care and evaluation due to potential at risk complications.   Antavius Sperbeck DPM  

## 2022-07-13 ENCOUNTER — Ambulatory Visit (INDEPENDENT_AMBULATORY_CARE_PROVIDER_SITE_OTHER): Payer: Medicare Other | Admitting: Podiatry

## 2022-07-13 ENCOUNTER — Encounter: Payer: Self-pay | Admitting: Podiatry

## 2022-07-13 DIAGNOSIS — E119 Type 2 diabetes mellitus without complications: Secondary | ICD-10-CM | POA: Diagnosis not present

## 2022-07-13 DIAGNOSIS — D689 Coagulation defect, unspecified: Secondary | ICD-10-CM | POA: Diagnosis not present

## 2022-07-13 DIAGNOSIS — B351 Tinea unguium: Secondary | ICD-10-CM | POA: Diagnosis not present

## 2022-07-13 DIAGNOSIS — M79675 Pain in left toe(s): Secondary | ICD-10-CM | POA: Diagnosis not present

## 2022-07-13 DIAGNOSIS — M79674 Pain in right toe(s): Secondary | ICD-10-CM

## 2022-07-13 NOTE — Progress Notes (Signed)
This patient returns to my office for at risk foot care.  This patient requires this care by a professional since this patient will be at risk due to having thrombocytopenia, CKD, and diabetes.  This patient is unable to cut nails himself since the patient cannot reach his nails.These nails are painful walking and wearing shoes.  This patient presents for at risk foot care today. . Patient is taking plavix.  General Appearance  Alert, conversant and in no acute stress.  Vascular  Dorsalis pedis and posterior tibial  pulses are palpable  bilaterally.  Capillary return is within normal limits  bilaterally. Temperature is within normal limits  bilaterally.  Neurologic  Senn-Weinstein monofilament wire test within normal limits  bilaterally. Muscle power within normal limits bilaterally.  Nails Thick disfigured discolored nails with subungual debris  from hallux to fifth toes bilaterally. No evidence of bacterial infection or drainage bilaterally.  Orthopedic  No limitations of motion  feet .  No crepitus or effusions noted.  No bony pathology or digital deformities noted.  HAV  B/L.  Skin  normotropic skin with no porokeratosis noted bilaterally.  No signs of infections or ulcers noted.     Onychomycosis  Pain in right toes  Pain in left toes  Consent was obtained for treatment procedures.   Mechanical debridement of nails 1-5  bilaterally performed with a nail nipper.  Filed with dremel without incident.    Return office visit    3 months                  Told patient to return for periodic foot care and evaluation due to potential at risk complications.   Brailey Buescher DPM  

## 2022-07-25 NOTE — Progress Notes (Unsigned)
Subjective:    Patient ID: Collin [name], male    DOB: Aug 15, 1925, 88 y.o.   MRN: [id number]     HPI Collin [name] is here for a physical exam.   Left ear feels stopped up - feels clogged and does not clear when he blows his nose.   Some decreased hearing.  No ear pain.  Doing well otherwise.  Not able to do much outside anymore-gets short of breath with moderate exertion.  He is able to do things inside the house.   Medications and allergies reviewed with patient and updated if appropriate.  Current Outpatient Medications on File Prior to Visit  Medication Sig Dispense Refill   acetaminophen (TYLENOL) 500 MG tablet Take 500 mg by mouth every 6 (six) hours as needed for moderate pain or headache.      alfuzosin (UROXATRAL) 10 MG 24 hr tablet Take 10 mg by mouth daily.      aspirin EC 81 MG tablet Take 1 tablet (81 mg total) by mouth daily.     carvedilol (COREG) 3.125 MG tablet TAKE 1 TABLET TWICE A DAY 180 tablet 2   clopidogrel (PLAVIX) 75 MG tablet TAKE 1 TABLET DAILY 90 tablet 3   fenofibrate (TRICOR) 48 MG tablet TAKE 1 TABLET DAILY 90 tablet 3   finasteride (PROSCAR) 5 MG tablet Take 5 mg by mouth daily.     fluocinonide ointment (LIDEX) 0.05 % SMARTSIG:1 Sparingly Topical Twice Daily     losartan (COZAAR) 50 MG tablet TAKE 1/2 TABLET IN THE     MORNING AND AT BEDTIME 90 tablet 3   metroNIDAZOLE (METROGEL) 0.75 % gel Apply topically 2 (two) times daily.     Multiple Vitamin (MULTIVITAMIN) tablet Take 1 tablet by mouth daily.     nitroGLYCERIN (NITROSTAT) 0.4 MG SL tablet Place 1 tablet (0.4 mg total) under the tongue every 5 (five) minutes as needed for chest pain. 25 tablet 6   rosuvastatin (CRESTOR) 5 MG tablet TAKE 1 TABLET AT BEDTIME 90 tablet 2   sodium chloride (OCEAN) 0.65 % SOLN nasal spray Place 1 spray into both nostrils as needed for congestion.     triamcinolone cream (KENALOG) 0.1 % SMARTSIG:1 Application Topical 2-3 Times Daily     triamcinolone ointment (KENALOG)  0.1 % Apply 1 application topically daily.      No current facility-administered medications on file prior to visit.    Review of Systems  Constitutional:  Negative for fever.  HENT:  Positive for hearing loss. Negative for ear pain.   Eyes:  Negative for visual disturbance.  Respiratory:  Positive for shortness of breath (with moderate exertion). Negative for cough and wheezing.   Cardiovascular:  Positive for chest pain (occ, transient). Negative for palpitations and leg swelling.  Gastrointestinal:  Negative for abdominal pain, blood in stool, constipation, diarrhea and nausea.       Rare gerd  Genitourinary:  Negative for dysuria and hematuria.  Musculoskeletal:  Positive for arthralgias and back pain.  Skin:  Positive for rash (chronic).  Neurological:  Positive for headaches (occ). Negative for dizziness and light-headedness.  Psychiatric/Behavioral:  Negative for dysphoric mood. The patient is not nervous/anxious.        Objective:   Vitals:   07/26/22 1004  BP: 126/76  Pulse: (!) 58  Temp: 97.7 F (36.5 C)  SpO2: 96%   Filed Weights   07/26/22 1004  Weight: 172 lb (78 kg)   Body mass index is 26.94 kg/m.  BP  Readings from Last 3 Encounters:  07/26/22 126/76  02/11/22 122/70  01/24/22 102/60    Wt Readings from Last 3 Encounters:  07/26/22 172 lb (78 kg)  02/11/22 169 lb (76.7 kg)  01/24/22 171 lb (77.6 kg)      Physical Exam Constitutional: He appears well-developed and well-nourished. No distress.  HENT:  Head: Normocephalic and atraumatic.  Right Ear: External ear normal.  Left Ear: External ear normal.  Mouth/Throat: Oropharynx is clear and moist.  Right ear canal and tympanic membrane normal.  Left ear canal with excessive cerumen, TM unable to be visualized. Eyes: Conjunctivae and EOM are normal.  Neck: Neck supple. No tracheal deviation present. No thyromegaly present.  No carotid bruit  Cardiovascular: Normal rate, regular rhythm, normal  heart sounds and intact distal pulses.   No murmur heard. Pulmonary/Chest: Effort normal and breath sounds normal. No respiratory distress. He has no wheezes. He has no rales.  Abdominal: Soft. He exhibits no distension. There is no tenderness.  Genitourinary: deferred  Musculoskeletal: He exhibits no edema.  Lymphadenopathy:   He has no cervical adenopathy.  Skin: Skin is warm and dry. He is not diaphoretic.  Psychiatric: He has a normal mood and affect. His behavior is normal.         Assessment & Plan:   Physical exam: Screening blood work  ordered Exercise   not regular - active Weight  normal Substance abuse   none   Reviewed recommended immunizations.   Health Maintenance  Topic Date Due   HEMOGLOBIN A1C  07/24/2022   INFLUENZA VACCINE  03/26/2023 (Originally 07/26/2022)   FOOT EXAM  01/12/2023   OPHTHALMOLOGY EXAM  03/19/2023   TETANUS/TDAP  07/18/2027   Pneumonia Vaccine 74+ Years old  Completed   COVID-19 Vaccine  Completed   Zoster Vaccines- Shingrix  Completed   HPV VACCINES  Aged Out     See Problem List for Assessment and Plan of chronic medical problems.

## 2022-07-25 NOTE — Patient Instructions (Addendum)
Blood work was ordered.     Medications changes include :   none     Return in about 6 months (around 01/26/2023) for follow up.   Health Maintenance, Male Adopting a healthy lifestyle and getting preventive care are important in promoting health and wellness. Ask your health care provider about: The right schedule for you to have regular tests and exams. Things you can do on your own to prevent diseases and keep yourself healthy. What should I know about diet, weight, and exercise? Eat a healthy diet  Eat a diet that includes plenty of vegetables, fruits, low-fat dairy products, and lean protein. Do not eat a lot of foods that are high in solid fats, added sugars, or sodium. Maintain a healthy weight Body mass index (BMI) is a measurement that can be used to identify possible weight problems. It estimates body fat based on height and weight. Your health care provider can help determine your BMI and help you achieve or maintain a healthy weight. Get regular exercise Get regular exercise. This is one of the most important things you can do for your health. Most adults should: Exercise for at least 150 minutes each week. The exercise should increase your heart rate and make you sweat (moderate-intensity exercise). Do strengthening exercises at least twice a week. This is in addition to the moderate-intensity exercise. Spend less time sitting. Even light physical activity can be beneficial. Watch cholesterol and blood lipids Have your blood tested for lipids and cholesterol at 86 years of age, then have this test every 5 years. You may need to have your cholesterol levels checked more often if: Your lipid or cholesterol levels are high. You are older than 86 years of age. You are at high risk for heart disease. What should I know about cancer screening? Many types of cancers can be detected early and may often be prevented. Depending on your health history and family history, you  may need to have cancer screening at various ages. This may include screening for: Colorectal cancer. Prostate cancer. Skin cancer. Lung cancer. What should I know about heart disease, diabetes, and high blood pressure? Blood pressure and heart disease High blood pressure causes heart disease and increases the risk of stroke. This is more likely to develop in people who have high blood pressure readings or are overweight. Talk with your health care provider about your target blood pressure readings. Have your blood pressure checked: Every 3-5 years if you are 59-43 years of age. Every year if you are 39 years old or older. If you are between the ages of 75 and 27 and are a current or former smoker, ask your health care provider if you should have a one-time screening for abdominal aortic aneurysm (AAA). Diabetes Have regular diabetes screenings. This checks your fasting blood sugar level. Have the screening done: Once every three years after age 60 if you are at a normal weight and have a low risk for diabetes. More often and at a younger age if you are overweight or have a high risk for diabetes. What should I know about preventing infection? Hepatitis B If you have a higher risk for hepatitis B, you should be screened for this virus. Talk with your health care provider to find out if you are at risk for hepatitis B infection. Hepatitis C Blood testing is recommended for: Everyone born from [birth year] through 1963. Anyone with known risk factors for hepatitis C. Sexually transmitted infections (STIs) You  should be screened each year for STIs, including gonorrhea and chlamydia, if: You are sexually active and are younger than 86 years of age. You are older than 86 years of age and your health care provider tells you that you are at risk for this type of infection. Your sexual activity has changed since you were last screened, and you are at increased risk for chlamydia or gonorrhea. Ask your  health care provider if you are at risk. Ask your health care provider about whether you are at high risk for HIV. Your health care provider may recommend a prescription medicine to help prevent HIV infection. If you choose to take medicine to prevent HIV, you should first get tested for HIV. You should then be tested every 3 months for as long as you are taking the medicine. Follow these instructions at home: Alcohol use Do not drink alcohol if your health care provider tells you not to drink. If you drink alcohol: Limit how much you have to 0-2 drinks a day. Know how much alcohol is in your drink. In the [place] one drink equals one 12 oz bottle of beer (355 mL), one 5 oz glass of wine (148 mL), or one 1 oz glass of hard liquor (44 mL). Lifestyle Do not use any products that contain nicotine or tobacco. These products include cigarettes, chewing tobacco, and vaping devices, such as e-cigarettes. If you need help quitting, ask your health care provider. Do not use street drugs. Do not share needles. Ask your health care provider for help if you need support or information about quitting drugs. General instructions Schedule regular health, dental, and eye exams. Stay current with your vaccines. Tell your health care provider if: You often feel depressed. You have ever been abused or do not feel safe at home. Summary Adopting a healthy lifestyle and getting preventive care are important in promoting health and wellness. Follow your health care provider's instructions about healthy diet, exercising, and getting tested or screened for diseases. Follow your health care provider's instructions on monitoring your cholesterol and blood pressure. This information is not intended to replace advice given to you by your health care provider. Make sure you discuss any questions you have with your health care provider. Document Revised: 05/03/2021 Document Reviewed: 05/03/2021 Elsevier Patient Education   [place].

## 2022-07-26 ENCOUNTER — Encounter: Payer: Self-pay | Admitting: Internal Medicine

## 2022-07-26 ENCOUNTER — Ambulatory Visit (INDEPENDENT_AMBULATORY_CARE_PROVIDER_SITE_OTHER): Payer: Medicare Other | Admitting: Internal Medicine

## 2022-07-26 VITALS — BP 126/76 | HR 58 | Temp 97.7°F | Ht 67.0 in | Wt 172.0 lb

## 2022-07-26 DIAGNOSIS — E785 Hyperlipidemia, unspecified: Secondary | ICD-10-CM | POA: Diagnosis not present

## 2022-07-26 DIAGNOSIS — Z0000 Encounter for general adult medical examination without abnormal findings: Secondary | ICD-10-CM | POA: Diagnosis not present

## 2022-07-26 DIAGNOSIS — E1122 Type 2 diabetes mellitus with diabetic chronic kidney disease: Secondary | ICD-10-CM

## 2022-07-26 DIAGNOSIS — N1831 Chronic kidney disease, stage 3a: Secondary | ICD-10-CM | POA: Diagnosis not present

## 2022-07-26 DIAGNOSIS — I2510 Atherosclerotic heart disease of native coronary artery without angina pectoris: Secondary | ICD-10-CM

## 2022-07-26 DIAGNOSIS — H6122 Impacted cerumen, left ear: Secondary | ICD-10-CM | POA: Diagnosis not present

## 2022-07-26 DIAGNOSIS — I10 Essential (primary) hypertension: Secondary | ICD-10-CM

## 2022-07-26 LAB — CBC WITH DIFFERENTIAL/PLATELET
Basophils Absolute: 0 10*3/uL (ref 0.0–0.1)
Basophils Relative: 1 % (ref 0.0–3.0)
Eosinophils Absolute: 0.2 10*3/uL (ref 0.0–0.7)
Eosinophils Relative: 4.4 % (ref 0.0–5.0)
HCT: 39.7 % (ref 39.0–52.0)
Hemoglobin: 13.4 g/dL (ref 13.0–17.0)
Lymphocytes Relative: 32 % (ref 12.0–46.0)
Lymphs Abs: 1.3 10*3/uL (ref 0.7–4.0)
MCHC: 33.8 g/dL (ref 30.0–36.0)
MCV: 94.5 fl (ref 78.0–100.0)
Monocytes Absolute: 0.6 10*3/uL (ref 0.1–1.0)
Monocytes Relative: 13.7 % — ABNORMAL HIGH (ref 3.0–12.0)
Neutro Abs: 2 10*3/uL (ref 1.4–7.7)
Neutrophils Relative %: 48.9 % (ref 43.0–77.0)
Platelets: 111 10*3/uL — ABNORMAL LOW (ref 150.0–400.0)
RBC: 4.2 Mil/uL — ABNORMAL LOW (ref 4.22–5.81)
RDW: 13.8 % (ref 11.5–15.5)
WBC: 4.1 10*3/uL (ref 4.0–10.5)

## 2022-07-26 LAB — COMPREHENSIVE METABOLIC PANEL
ALT: 16 U/L (ref 0–53)
AST: 18 U/L (ref 0–37)
Albumin: 4.4 g/dL (ref 3.5–5.2)
Alkaline Phosphatase: 48 U/L (ref 39–117)
BUN: 32 mg/dL — ABNORMAL HIGH (ref 6–23)
CO2: 29 mEq/L (ref 19–32)
Calcium: 9.2 mg/dL (ref 8.4–10.5)
Chloride: 104 mEq/L (ref 96–112)
Creatinine, Ser: 1.84 mg/dL — ABNORMAL HIGH (ref 0.40–1.50)
GFR: 30.86 mL/min — ABNORMAL LOW (ref >60.00)
Glucose, Bld: 100 mg/dL — ABNORMAL HIGH (ref 70–99)
Potassium: 5 mEq/L (ref 3.5–5.1)
Sodium: 138 mEq/L (ref 135–145)
Total Bilirubin: 0.8 mg/dL (ref 0.2–1.2)
Total Protein: 6.6 g/dL (ref 6.0–8.3)

## 2022-07-26 LAB — LIPID PANEL
Cholesterol: 163 mg/dL (ref 0–200)
HDL: 38.7 mg/dL — ABNORMAL LOW (ref >39.00)
NonHDL: 124.54
Total CHOL/HDL Ratio: 4
Triglycerides: 264 mg/dL — ABNORMAL HIGH (ref 0.0–149.0)
VLDL: 52.8 mg/dL — ABNORMAL HIGH (ref 0.0–40.0)

## 2022-07-26 LAB — LDL CHOLESTEROL, DIRECT: Direct LDL: 83 mg/dL

## 2022-07-26 LAB — HEMOGLOBIN A1C: Hgb A1c MFr Bld: 6.6 % — ABNORMAL HIGH (ref 4.6–6.5)

## 2022-07-26 NOTE — Progress Notes (Signed)
PRE-PROCEDURE EXAM: Left TM cannot be visualized due to total occlusion/impaction of the ear canal.  PROCEDURE INDICATION: remove wax to visualize ear drum & relieve discomfort  CONSENT:  Verbal     PROCEDURE NOTE:     LEFT EAR:  I used a plastic wax curette under direct vision with an otoscope to free the wax bolus from the ear wall and then successfully removed a small bit of wax. The ear was then irrigated with warm water to remove the remaining wax.  POST- PROCEDURE EXAM: TMs successfully visualized and found to have no erythema     The patient tolerated the procedure well.

## 2022-07-26 NOTE — Assessment & Plan Note (Addendum)
Chronic Regular exercise and healthy diet encouraged Check lipid panel  Continue Crestor 5 mg every other day

## 2022-07-26 NOTE — Assessment & Plan Note (Signed)
Chronic Blood pressure well controlled CMP Continue Coreg 3.125 mg twice daily, losartan 25 mg twice daily 

## 2022-07-26 NOTE — Assessment & Plan Note (Signed)
Chronic Following with cardiology Has occasional chest pain-it is transient, shortness of breath with exertion that is moderate in nature-symptoms stable Continue current medications

## 2022-07-26 NOTE — Assessment & Plan Note (Signed)
Chronic Diet controlled Lab Results  Component Value Date   HGBA1C 6.5 01/24/2022   Well-controlled Check A1c Continue diabetic diet

## 2022-08-01 ENCOUNTER — Ambulatory Visit: Payer: Medicare Other | Admitting: *Deleted

## 2022-08-01 DIAGNOSIS — I10 Essential (primary) hypertension: Secondary | ICD-10-CM

## 2022-08-01 DIAGNOSIS — E1122 Type 2 diabetes mellitus with diabetic chronic kidney disease: Secondary | ICD-10-CM

## 2022-08-01 DIAGNOSIS — E785 Hyperlipidemia, unspecified: Secondary | ICD-10-CM

## 2022-08-01 NOTE — Chronic Care Management (AMB) (Signed)
Care Management    RN Visit Note  08/01/2022 Name: Collin [name] MRN: [id number] DOB: Dec 29, 1924  Subjective: Collin [name] is a 88 y.o. year old male who is a primary care patient of Burns, Claudina Lick, MD. The care management team was consulted for assistance with disease management and care coordination needs.    Engaged with patient by telephone for follow up visit/ RN CM case closure in response to provider referral for case management and/or care coordination services.   Consent to Services:   Collin [name] was given information about Care Management services 11/08/21 including:  Care Management services includes personalized support from designated clinical staff supervised by his physician, including individualized plan of care and coordination with other care providers 24/7 contact phone numbers for assistance for urgent and routine care needs. The patient may stop case management services at any time by phone call to the office staff.  Patient agreed to services and consent obtained.   Assessment: Review of patient past medical history, allergies, medications, health status, including review of consultants reports, laboratory and other test data, was performed as part of comprehensive evaluation and provision of chronic care management services.   SDOH (Social Determinants of Health) assessments and interventions performed:  SDOH Interventions    Flowsheet Row Most Recent Value  SDOH Interventions   Food Insecurity Interventions Intervention Not Indicated  [continues to deny food insecurity]  Transportation Interventions Intervention Not Indicated  [continues to drive self]     Care Plan  Allergies  Allergen Reactions   Crestor [Rosuvastatin]     Muscle pain   Atorvastatin     Muscle aches    Peanut Oil Rash    sneezing   Trandolapril Cough   Outpatient Encounter Medications as of 08/01/2022  Medication Sig   acetaminophen (TYLENOL) 500 MG tablet Take 500 mg by mouth every  6 (six) hours as needed for moderate pain or headache.    alfuzosin (UROXATRAL) 10 MG 24 hr tablet Take 10 mg by mouth daily.    aspirin EC 81 MG tablet Take 1 tablet (81 mg total) by mouth daily.   carvedilol (COREG) 3.125 MG tablet TAKE 1 TABLET TWICE A DAY   clopidogrel (PLAVIX) 75 MG tablet TAKE 1 TABLET DAILY   fenofibrate (TRICOR) 48 MG tablet TAKE 1 TABLET DAILY   finasteride (PROSCAR) 5 MG tablet Take 5 mg by mouth daily.   fluocinonide ointment (LIDEX) 0.05 % SMARTSIG:1 Sparingly Topical Twice Daily   losartan (COZAAR) 50 MG tablet TAKE 1/2 TABLET IN THE     MORNING AND AT BEDTIME   metroNIDAZOLE (METROGEL) 0.75 % gel Apply topically 2 (two) times daily.   Multiple Vitamin (MULTIVITAMIN) tablet Take 1 tablet by mouth daily.   nitroGLYCERIN (NITROSTAT) 0.4 MG SL tablet Place 1 tablet (0.4 mg total) under the tongue every 5 (five) minutes as needed for chest pain.   rosuvastatin (CRESTOR) 5 MG tablet TAKE 1 TABLET AT BEDTIME   sodium chloride (OCEAN) 0.65 % SOLN nasal spray Place 1 spray into both nostrils as needed for congestion.   triamcinolone cream (KENALOG) 0.1 % SMARTSIG:1 Application Topical 2-3 Times Daily   triamcinolone ointment (KENALOG) 0.1 % Apply 1 application topically daily.    No facility-administered encounter medications on file as of 08/01/2022.   Patient Active Problem List   Diagnosis Date Noted   Unstable angina ([place])    Shortness of breath 11/01/2019   Thrombocytopenia (H[facility])    Skin cancer  Pulmonary nodule    Nocturia    Myocardial infarction (HCC)    Low back pain syndrome    COPD (chronic obstructive pulmonary disease) (HCC)    BPH (benign prostatic hyperplasia)    Bladder cancer (HCC)    Arthritis    CKD (chronic kidney disease), stage III 07/10/2017   Diabetes ([place]) 04/15/2017   Fatty liver 06/18/2016   Rash and nonspecific skin eruption 06/13/2016   H/O bacterial endocarditis 04/06/2016   Myalgia and myositis 12/18/2015   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   Gynecomastia    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   Dermatochalasis 07/04/2012   Bilateral pseudophakia 07/04/2012   Spinal stenosis, lumbar 05/12/2012   Keratitis 05/01/2012   Epiretinal membrane 05/01/2012   Degenerative drusen 05/01/2012   CAD (coronary artery disease)    Carotid artery disease ([place])    Fuchs' corneal dystrophy 01/04/2012   CERVICAL RADICULOPATHY, LEFT 12/07/2009   SKIN CANCER, HX OF 12/07/2009   Renal cell carcinoma ([place]) 07/18/2009   ANEMIA, 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   GERD 07/18/2009   Essential hypertension 04/23/2008   Dyslipidemia 01/25/2007   Cancer ([place]) 02/28/2001   Conditions to be addressed/monitored: HTN, HLD, and DMII  Care Plan : RN Care Manager Plan of Care  Updates made by Knox Royalty, RN since 08/01/2022 12:00 AM     Problem: Chronic Disease Management Needs   Priority: High     Long-Range Goal: Ongoing adherence to established plan of care for long term chronic disease management   Start Date: 11/16/2021  Expected End Date: 11/16/2022  Priority: High  Note:   Current Barriers:  Chronic Disease Management support and education needs related to HLD and DMII  RNCM Clinical Goal(s):  Patient will demonstrate ongoing health management independence as evidenced by DMII; HLD        through collaboration with RN Care manager, provider, and care team.   Interventions: 1:1 collaboration with primary care provider regarding development and update of comprehensive plan of care as evidenced by provider attestation and co-signature Inter-disciplinary care team collaboration (see longitudinal plan of care) Evaluation of current treatment plan related to  self management and patient's adherence to plan as established by provider SDOH updated: No new/ unmet needs identified Pain Assessment updated: today reports occasional intermittent chronic low back pain, well  controlled with rest. Tylenol only Falls assessment updated: continues to deny new/ recent falls; no falls x > 12 months; does not use assistive devices; previously provided education around fall risks/ prevention reinforced Reviewed with patient recent PCP office visit 07/26/22:  patient denies questions/ concerns post- office visit and verbalizes good understanding of post-visit instructions Confirmed no medication changes- patient denies medication concerns and reports he continues to independently self-manage medications Reviewed with patient recent lab results: patient remains very astute regarding his lab values Discussed plans with patient for ongoing care management follow up- patient denies current care coordination/ care management needs and is agreeable to RN CM case closure today; verbalizes understanding to contact PCP or other care providers for any needs that arise in the future, and confirms he has contact information for all care providers     Diabetes:  (Status: 08/01/22: Goal Met.) Long Term Goal   Lab Results  Component Value Date   HGBA1C 6.6 (H) 07/26/2022  Provided education to patient about basic DM disease process; Reviewed prescribed diet with patient heart healthy, low sodium, low cholesterol, carbohydrate modified/ low sugar; Reviewed scheduled/upcoming provider  appointments including: 10/17/22- podiatry provider; 12/05/22- [facility]/ PCP;         Review of patient status, including review of consultants reports, relevant laboratory and other test results, and medications completed;       Assessed social determinant of health barriers;        Reviewed individual historical A1-C trends and provided education around correlation of A1-C value to blood sugar levels at home over 3 months Confirmed patient stays active- continues to bowl with his bowling league regularly-- benefits of activity/ regular exercise reinforced with patient; positive reinforcement provided with  encouragement to continue efforts Confirmed patient adherent to prescribed diet: positive reinforcement provided with encouragement to continue efforts Confirmed patient obtains regular foot care through podiatry provider; denies questions/ concerns around foot care  Hyperlipidemia:  (Status: 08/01/22: Goal Met.) Long Term Goal   Lab Results  Component Value Date   CHOL 163 07/26/2022   HDL 38.70 (L) 07/26/2022   LDLCALC 53 01/18/2021   LDLDIRECT 83.0 07/26/2022   TRIG 264.0 (H) 07/26/2022   CHOLHDL 4 07/26/2022    Counseled on importance of regular laboratory monitoring as prescribed; Reviewed role and benefits of statin for ASCVD risk reduction; Reviewed importance of limiting foods high in cholesterol; Reviewed exercise goals and target of 150 minutes per week; Confirmed patient has continued taking Rosuvastatin 5 mg every day;  Confirmed patient continues to monitor blood pressures at home daily: he reports general consistent ranges between 120-130/ 58-60; reports blood pressure today of "122/59"     Plan:  No further follow up required: patient denies current care coordination/ care management needs and is agreeable to RN CM case closure today; RN CM case closure accordingly    Oneta Rack, RN, BSN, [place] [phone]: direct office

## 2022-10-17 ENCOUNTER — Encounter: Payer: Self-pay | Admitting: Internal Medicine

## 2022-10-17 ENCOUNTER — Ambulatory Visit: Payer: Medicare Other | Admitting: Podiatry

## 2022-10-17 NOTE — Progress Notes (Signed)
Subjective:    Patient ID: Collin [name], male    DOB: Sep 01, 1925, 88 y.o.   MRN: [id number]      HPI Collin [name] is here for  Chief Complaint  Patient presents with   Sinusitis    Nasal drainage, sore throat, cough (dry)     Cough, sinus pressure - his symptoms started one weeks ago.  It started with sore throat and then progressed.  He states low-grade fever, nasal congestion, postnasal drip, runny nose, sinus pain/pressure, sore throat, cough that is primarily dry and related to irritation in the throat, mild shortness of breath and headaches.   Covid test neg x 2 Friday, Saturday and Sunday.     Taking tylenol.   Medications and allergies reviewed with patient and updated if appropriate.  Current Outpatient Medications on File Prior to Visit  Medication Sig Dispense Refill   acetaminophen (TYLENOL) 500 MG tablet Take 500 mg by mouth every 6 (six) hours as needed for moderate pain or headache.      alfuzosin (UROXATRAL) 10 MG 24 hr tablet Take 10 mg by mouth daily.      aspirin EC 81 MG tablet Take 1 tablet (81 mg total) by mouth daily.     carvedilol (COREG) 3.125 MG tablet TAKE 1 TABLET TWICE A DAY 180 tablet 2   clopidogrel (PLAVIX) 75 MG tablet TAKE 1 TABLET DAILY 90 tablet 3   fenofibrate (TRICOR) 48 MG tablet TAKE 1 TABLET DAILY 90 tablet 3   finasteride (PROSCAR) 5 MG tablet Take 5 mg by mouth daily.     fluocinonide ointment (LIDEX) 0.05 % SMARTSIG:1 Sparingly Topical Twice Daily     losartan (COZAAR) 50 MG tablet TAKE 1/2 TABLET IN THE     MORNING AND AT BEDTIME 90 tablet 3   metroNIDAZOLE (METROGEL) 0.75 % gel Apply topically 2 (two) times daily.     Multiple Vitamin (MULTIVITAMIN) tablet Take 1 tablet by mouth daily.     nitroGLYCERIN (NITROSTAT) 0.4 MG SL tablet Place 1 tablet (0.4 mg total) under the tongue every 5 (five) minutes as needed for chest pain. 25 tablet 6   rosuvastatin (CRESTOR) 5 MG tablet TAKE 1 TABLET AT BEDTIME 90 tablet 2   sodium chloride  (OCEAN) 0.65 % SOLN nasal spray Place 1 spray into both nostrils as needed for congestion.     triamcinolone cream (KENALOG) 0.1 % SMARTSIG:1 Application Topical 2-3 Times Daily     triamcinolone ointment (KENALOG) 0.1 % Apply 1 application topically daily.      No current facility-administered medications on file prior to visit.    Review of Systems  Constitutional:  Positive for fever (low grade). Negative for chills.  HENT:  Positive for congestion, postnasal drip, rhinorrhea, sinus pressure, sinus pain and sore throat. Negative for ear pain.        Sore in nose  Respiratory:  Positive for cough (mild - started yesterday morning - from irritation) and shortness of breath (mild). Negative for wheezing.   Musculoskeletal:  Negative for myalgias.  Neurological:  Positive for headaches. Negative for dizziness and light-headedness.       Objective:   Vitals:   10/18/22 1541  BP: 138/80  Pulse: (!) 59  Temp: 98 F (36.7 C)  SpO2: 96%   BP Readings from Last 3 Encounters:  10/18/22 138/80  07/26/22 126/76  02/11/22 122/70   Wt Readings from Last 3 Encounters:  10/18/22 167 lb (75.8 kg)  07/26/22 172 lb (78 kg)  02/11/22 169 lb (76.7 kg)   Body mass index is 26.16 kg/m.    Physical Exam Constitutional:      General: He is not in acute distress.    Appearance: Normal appearance. He is not ill-appearing.  HENT:     Head: Normocephalic.     Right Ear: Tympanic membrane, ear canal and external ear normal. There is no impacted cerumen.     Left Ear: Tympanic membrane, ear canal and external ear normal. There is no impacted cerumen.     Mouth/Throat:     Mouth: Mucous membranes are moist.     Pharynx: No oropharyngeal exudate or posterior oropharyngeal erythema.  Eyes:     Conjunctiva/sclera: Conjunctivae normal.  Cardiovascular:     Rate and Rhythm: Normal rate and regular rhythm.  Pulmonary:     Effort: Pulmonary effort is normal. No respiratory distress.     Breath  sounds: Normal breath sounds. No wheezing or rales.  Musculoskeletal:     Cervical back: Neck supple. No tenderness.  Lymphadenopathy:     Cervical: No cervical adenopathy.  Skin:    General: Skin is warm and dry.     Findings: No rash.  Neurological:     Mental Status: He is alert.            Assessment & Plan:    See Problem List for Assessment and Plan of chronic medical problems.

## 2022-10-18 ENCOUNTER — Ambulatory Visit (INDEPENDENT_AMBULATORY_CARE_PROVIDER_SITE_OTHER): Payer: Medicare Other | Admitting: Internal Medicine

## 2022-10-18 DIAGNOSIS — I10 Essential (primary) hypertension: Secondary | ICD-10-CM | POA: Diagnosis not present

## 2022-10-18 DIAGNOSIS — J0100 Acute maxillary sinusitis, unspecified: Secondary | ICD-10-CM

## 2022-10-18 DIAGNOSIS — N1831 Chronic kidney disease, stage 3a: Secondary | ICD-10-CM

## 2022-10-18 DIAGNOSIS — J019 Acute sinusitis, unspecified: Secondary | ICD-10-CM | POA: Insufficient documentation

## 2022-10-18 DIAGNOSIS — E1122 Type 2 diabetes mellitus with diabetic chronic kidney disease: Secondary | ICD-10-CM | POA: Diagnosis not present

## 2022-10-18 MED ORDER — AMOXICILLIN-POT CLAVULANATE 875-125 MG PO TABS: 1.0000 | ORAL_TABLET | Freq: Two times a day (BID) | ORAL | 0 refills | Status: DC

## 2022-10-18 NOTE — Patient Instructions (Addendum)
     You have a sinus infection    Take the antibiotic as prescribed.  Continue tylenol as needed.    Rest, fluids    Return if symptoms worsen or fail to improve.

## 2022-10-18 NOTE — Assessment & Plan Note (Addendum)
Acute COVID negative x3 at home Likely bacterial  Start Augmentin 875-125 mg BID x 10 day otc cold medications Rest, fluid Call if no improvement

## 2022-10-18 NOTE — Assessment & Plan Note (Signed)
Chronic Blood pressure well controlled Continue carvedilol 3.125 mg twice daily, losartan 25 mg twice daily

## 2022-10-18 NOTE — Assessment & Plan Note (Signed)
Chronic Diet controlled Lab Results  Component Value Date   HGBA1C 6.6 (H) 07/26/2022   Sugars controlled with diet Low risk for complications with this infection

## 2022-10-23 ENCOUNTER — Encounter: Payer: Self-pay | Admitting: Internal Medicine

## 2022-10-31 ENCOUNTER — Encounter: Payer: Self-pay | Admitting: Podiatry

## 2022-10-31 ENCOUNTER — Ambulatory Visit (INDEPENDENT_AMBULATORY_CARE_PROVIDER_SITE_OTHER): Payer: Medicare Other | Admitting: Podiatry

## 2022-10-31 DIAGNOSIS — N1832 Chronic kidney disease, stage 3b: Secondary | ICD-10-CM | POA: Diagnosis not present

## 2022-10-31 DIAGNOSIS — B351 Tinea unguium: Secondary | ICD-10-CM

## 2022-10-31 DIAGNOSIS — D689 Coagulation defect, unspecified: Secondary | ICD-10-CM

## 2022-10-31 DIAGNOSIS — M79674 Pain in right toe(s): Secondary | ICD-10-CM

## 2022-10-31 DIAGNOSIS — E119 Type 2 diabetes mellitus without complications: Secondary | ICD-10-CM

## 2022-10-31 DIAGNOSIS — M79675 Pain in left toe(s): Secondary | ICD-10-CM

## 2022-10-31 NOTE — Progress Notes (Signed)
This patient returns to my office for at risk foot care.  This patient requires this care by a professional since this patient will be at risk due to having thrombocytopenia, CKD, and diabetes.  This patient is unable to cut nails himself since the patient cannot reach his nails.These nails are painful walking and wearing shoes.  This patient presents for at risk foot care today. . Patient is taking plavix.  General Appearance  Alert, conversant and in no acute stress.  Vascular  Dorsalis pedis and posterior tibial  pulses are palpable  bilaterally.  Capillary return is within normal limits  bilaterally. Temperature is within normal limits  bilaterally.  Neurologic  Senn-Weinstein monofilament wire test within normal limits  bilaterally. Muscle power within normal limits bilaterally.  Nails Thick disfigured discolored nails with subungual debris  from hallux to fifth toes bilaterally. No evidence of bacterial infection or drainage bilaterally.  Orthopedic  No limitations of motion  feet .  No crepitus or effusions noted.  No bony pathology or digital deformities noted.  HAV  B/L.  Skin  normotropic skin with no porokeratosis noted bilaterally.  No signs of infections or ulcers noted.     Onychomycosis  Pain in right toes  Pain in left toes  Consent was obtained for treatment procedures.   Mechanical debridement of nails 1-5  bilaterally performed with a nail nipper.  Filed with dremel without incident.    Return office visit    3 months                  Told patient to return for periodic foot care and evaluation due to potential at risk complications.   Gardiner Barefoot DPM

## 2022-12-05 ENCOUNTER — Ambulatory Visit: Payer: Medicare Other | Admitting: Internal Medicine

## 2022-12-07 ENCOUNTER — Telehealth: Payer: Self-pay

## 2022-12-07 NOTE — Telephone Encounter (Signed)
Called patient lvm to return call, to complete AWV at [phone].  If no return call within 15 minutes, patient may reschedule for the next available appointment with [facility] or CMA. -S. Offie Waide,LPN 

## 2022-12-15 ENCOUNTER — Other Ambulatory Visit: Payer: Self-pay

## 2022-12-15 MED ORDER — FENOFIBRATE 48 MG PO TABS: 48.0000 mg | ORAL_TABLET | Freq: Every day | ORAL | 0 refills | Status: DC

## 2022-12-30 ENCOUNTER — Other Ambulatory Visit: Payer: Self-pay

## 2022-12-30 MED ORDER — COVID-19 MRNA VAC-TRIS(PFIZER) 30 MCG/0.3ML IM SUSY
PREFILLED_SYRINGE | INTRAMUSCULAR | 0 refills | Status: DC
  Filled 2022-12-30: qty 0.3, 1d supply, fill #0

## 2023-01-05 ENCOUNTER — Other Ambulatory Visit: Payer: Self-pay | Admitting: Cardiovascular Disease

## 2023-01-29 ENCOUNTER — Encounter: Payer: Self-pay | Admitting: Internal Medicine

## 2023-01-29 NOTE — Progress Notes (Unsigned)
Subjective:    Patient ID: Collin [name], male    DOB: May 22, 1925, 89 y.o.   MRN: [id number]     HPI Bartosz is here for follow up of his chronic medical problems, including htn, DM, CKD, hld, CAD  Has pain and tightness behind right knee.  No knee pain.   Both calves feel sore.  He describes it as not really pain but more of a weakness.  He denies any muscle cramping.  It is intermittent.  Better with sitting.  Worse with activity.  No upper leg pain  Has chronic lower back pain.  Does not keep him from doing things. Bothers him if he bends over too much.  History of spinal stenosis for which he had surgery.    Medications and allergies reviewed with patient and updated if appropriate.  Current Outpatient Medications on File Prior to Visit  Medication Sig Dispense Refill   acetaminophen (TYLENOL) 500 MG tablet Take 500 mg by mouth every 6 (six) hours as needed for moderate pain or headache.      alfuzosin (UROXATRAL) 10 MG 24 hr tablet Take 10 mg by mouth daily.      aspirin EC 81 MG tablet Take 1 tablet (81 mg total) by mouth daily.     carvedilol (COREG) 3.125 MG tablet TAKE 1 TABLET TWICE A DAY 180 tablet 0   clopidogrel (PLAVIX) 75 MG tablet TAKE 1 TABLET DAILY 90 tablet 3   fenofibrate (TRICOR) 48 MG tablet Take 1 tablet (48 mg total) by mouth daily. 90 tablet 0   finasteride (PROSCAR) 5 MG tablet Take 5 mg by mouth daily.     fluocinonide ointment (LIDEX) 0.05 % SMARTSIG:1 Sparingly Topical Twice Daily     losartan (COZAAR) 50 MG tablet TAKE 1/2 TABLET IN THE     MORNING AND AT BEDTIME 90 tablet 3   metroNIDAZOLE (METROGEL) 0.75 % gel Apply topically 2 (two) times daily.     Multiple Vitamin (MULTIVITAMIN) tablet Take 1 tablet by mouth daily.     nitroGLYCERIN (NITROSTAT) 0.4 MG SL tablet Place 1 tablet (0.4 mg total) under the tongue every 5 (five) minutes as needed for chest pain. 25 tablet 6   rosuvastatin (CRESTOR) 5 MG tablet TAKE 1 TABLET AT BEDTIME 90 tablet 0    sodium chloride (OCEAN) 0.65 % SOLN nasal spray Place 1 spray into both nostrils as needed for congestion.     triamcinolone cream (KENALOG) 0.1 % SMARTSIG:1 Application Topical 2-3 Times Daily     triamcinolone ointment (KENALOG) 0.1 % Apply 1 application topically daily.      No current facility-administered medications on file prior to visit.     Review of Systems  Constitutional:  Negative for chills and fever.  Respiratory:  Negative for cough, shortness of breath and wheezing.   Cardiovascular:  Negative for chest pain, palpitations and leg swelling.  Musculoskeletal:  Positive for back pain (chronic).  Neurological:  Positive for headaches (occ). Negative for dizziness, light-headedness and numbness.       Objective:   Vitals:   01/30/23 1003  BP: 130/70  Pulse: (!) 55  Temp: 98 F (36.7 C)  SpO2: 94%   BP Readings from Last 3 Encounters:  01/30/23 130/70  10/18/22 138/80  07/26/22 126/76   Wt Readings from Last 3 Encounters:  01/30/23 169 lb (76.7 kg)  10/18/22 167 lb (75.8 kg)  07/26/22 172 lb (78 kg)   Body mass index is 26.47 kg/m.  Physical Exam Constitutional:      General: He is not in acute distress.    Appearance: Normal appearance. He is not ill-appearing.  HENT:     Head: Normocephalic and atraumatic.  Eyes:     Conjunctiva/sclera: Conjunctivae normal.  Cardiovascular:     Rate and Rhythm: Normal rate and regular rhythm.     Heart sounds: Normal heart sounds. No murmur heard. Pulmonary:     Effort: Pulmonary effort is normal. No respiratory distress.     Breath sounds: Normal breath sounds. No wheezing or rales.  Musculoskeletal:     Right lower leg: No edema.     Left lower leg: No edema.  Skin:    General: Skin is warm and dry.     Findings: No rash.  Neurological:     Mental Status: He is alert. Mental status is at baseline.  Psychiatric:        Mood and Affect: Mood normal.        Lab Results  Component Value Date   WBC  4.1 07/26/2022   HGB 13.4 07/26/2022   HCT 39.7 07/26/2022   PLT 111.0 (L) 07/26/2022   GLUCOSE 100 (H) 07/26/2022   CHOL 163 07/26/2022   TRIG 264.0 (H) 07/26/2022   HDL 38.70 (L) 07/26/2022   LDLDIRECT 83.0 07/26/2022   LDLCALC 53 01/18/2021   ALT 16 07/26/2022   AST 18 07/26/2022   NA 138 07/26/2022   K 5.0 07/26/2022   CL 104 07/26/2022   CREATININE 1.84 (H) 07/26/2022   BUN 32 (H) 07/26/2022   CO2 29 07/26/2022   TSH 3.40 07/08/2019   INR 1.04 11/04/2009   HGBA1C 6.6 (H) 07/26/2022   MICROALBUR <0.7 01/08/2018     Assessment & Plan:    See Problem List for Assessment and Plan of chronic medical problems.

## 2023-01-29 NOTE — Patient Instructions (Addendum)
      Blood work was ordered.   The lab is on the first floor.    Medications changes include :   none      Return in about 6 months (around 07/31/2023) for follow up.

## 2023-01-30 ENCOUNTER — Ambulatory Visit (INDEPENDENT_AMBULATORY_CARE_PROVIDER_SITE_OTHER): Payer: Medicare Other | Admitting: Internal Medicine

## 2023-01-30 VITALS — BP 130/70 | HR 55 | Temp 98.0°F | Ht 67.0 in | Wt 169.0 lb

## 2023-01-30 DIAGNOSIS — E785 Hyperlipidemia, unspecified: Secondary | ICD-10-CM

## 2023-01-30 DIAGNOSIS — I2510 Atherosclerotic heart disease of native coronary artery without angina pectoris: Secondary | ICD-10-CM | POA: Diagnosis not present

## 2023-01-30 DIAGNOSIS — D696 Thrombocytopenia, unspecified: Secondary | ICD-10-CM

## 2023-01-30 DIAGNOSIS — I10 Essential (primary) hypertension: Secondary | ICD-10-CM | POA: Diagnosis not present

## 2023-01-30 DIAGNOSIS — N183 Chronic kidney disease, stage 3 unspecified: Secondary | ICD-10-CM

## 2023-01-30 DIAGNOSIS — E1122 Type 2 diabetes mellitus with diabetic chronic kidney disease: Secondary | ICD-10-CM | POA: Diagnosis not present

## 2023-01-30 DIAGNOSIS — N1832 Chronic kidney disease, stage 3b: Secondary | ICD-10-CM

## 2023-01-30 DIAGNOSIS — M6281 Muscle weakness (generalized): Secondary | ICD-10-CM | POA: Insufficient documentation

## 2023-01-30 DIAGNOSIS — N1831 Chronic kidney disease, stage 3a: Secondary | ICD-10-CM

## 2023-01-30 LAB — COMPREHENSIVE METABOLIC PANEL
ALT: 18 U/L (ref 0–53)
AST: 19 U/L (ref 0–37)
Albumin: 4.5 g/dL (ref 3.5–5.2)
Alkaline Phosphatase: 40 U/L (ref 39–117)
BUN: 25 mg/dL — ABNORMAL HIGH (ref 6–23)
CO2: 28 mEq/L (ref 19–32)
Calcium: 9.4 mg/dL (ref 8.4–10.5)
Chloride: 104 mEq/L (ref 96–112)
Creatinine, Ser: 1.78 mg/dL — ABNORMAL HIGH (ref 0.40–1.50)
GFR: 32 mL/min — ABNORMAL LOW (ref >60.00)
Glucose, Bld: 106 mg/dL — ABNORMAL HIGH (ref 70–99)
Potassium: 5.1 mEq/L (ref 3.5–5.1)
Sodium: 138 mEq/L (ref 135–145)
Total Bilirubin: 1.3 mg/dL — ABNORMAL HIGH (ref 0.2–1.2)
Total Protein: 6.5 g/dL (ref 6.0–8.3)

## 2023-01-30 LAB — CBC WITH DIFFERENTIAL/PLATELET
Basophils Absolute: 0 10*3/uL (ref 0.0–0.1)
Basophils Relative: 0.7 % (ref 0.0–3.0)
Eosinophils Absolute: 0.2 10*3/uL (ref 0.0–0.7)
Eosinophils Relative: 3.9 % (ref 0.0–5.0)
HCT: 39.8 % (ref 39.0–52.0)
Hemoglobin: 13.6 g/dL (ref 13.0–17.0)
Lymphocytes Relative: 29.2 % (ref 12.0–46.0)
Lymphs Abs: 1.4 10*3/uL (ref 0.7–4.0)
MCHC: 34.1 g/dL (ref 30.0–36.0)
MCV: 93.4 fl (ref 78.0–100.0)
Monocytes Absolute: 0.5 10*3/uL (ref 0.1–1.0)
Monocytes Relative: 11.3 % (ref 3.0–12.0)
Neutro Abs: 2.6 10*3/uL (ref 1.4–7.7)
Neutrophils Relative %: 54.9 % (ref 43.0–77.0)
Platelets: 118 10*3/uL — ABNORMAL LOW (ref 150.0–400.0)
RBC: 4.26 Mil/uL (ref 4.22–5.81)
RDW: 13.6 % (ref 11.5–15.5)
WBC: 4.7 10*3/uL (ref 4.0–10.5)

## 2023-01-30 LAB — LIPID PANEL
Cholesterol: 117 mg/dL (ref 0–200)
HDL: 41.3 mg/dL (ref >39.00)
NonHDL: 75.87
Total CHOL/HDL Ratio: 3
Triglycerides: 216 mg/dL — ABNORMAL HIGH (ref 0.0–149.0)
VLDL: 43.2 mg/dL — ABNORMAL HIGH (ref 0.0–40.0)

## 2023-01-30 LAB — LDL CHOLESTEROL, DIRECT: Direct LDL: 49 mg/dL

## 2023-01-30 LAB — HEMOGLOBIN A1C: Hgb A1c MFr Bld: 6.4 % (ref 4.6–6.5)

## 2023-01-30 NOTE — Assessment & Plan Note (Signed)
Chronic ?Stable ?CMP ?

## 2023-01-30 NOTE — Assessment & Plan Note (Signed)
New [place] weakness or soreness in bilateral calf muscles and a tightness feeling in the posterior right knee No knee pain or swelling Only feels this with activity-none at rest Has history of spinal stenosis and had surgery years ago Symptoms possibly related to the lumbar spine Symptoms are not too bad now and are not inhibiting him from doing anything If they worsen can consider physical therapy or having him see orthopedics-used to go to [facility]-he will let me know

## 2023-01-30 NOTE — Assessment & Plan Note (Signed)
Chronic Blood pressure well controlled CMP Continue Coreg 3.125 mg twice daily, losartan 25 mg twice daily

## 2023-01-30 NOTE — Assessment & Plan Note (Signed)
Chronic Regular exercise and healthy diet encouraged Check lipid panel  Continue Crestor 5 mg daily, fenofibrate 48 mg daily

## 2023-01-30 NOTE — Assessment & Plan Note (Signed)
Chronic CBC 

## 2023-01-30 NOTE — Assessment & Plan Note (Signed)
Chronic   Lab Results  Component Value Date   HGBA1C 6.6 (H) 07/26/2022   Sugars controlled Check A1c Continue lifestyle controlled

## 2023-01-30 NOTE — Assessment & Plan Note (Signed)
Chronic Follows with cardiology Continue Coreg 3.125 mg twice daily, losartan 25 mg twice daily, Plavix 75 mg daily, fenofibrate 48 mg daily, Crestor 5 mg daily

## 2023-01-31 ENCOUNTER — Encounter: Payer: Self-pay | Admitting: Podiatry

## 2023-01-31 ENCOUNTER — Ambulatory Visit (INDEPENDENT_AMBULATORY_CARE_PROVIDER_SITE_OTHER): Payer: Medicare Other | Admitting: Podiatry

## 2023-01-31 DIAGNOSIS — B351 Tinea unguium: Secondary | ICD-10-CM

## 2023-01-31 DIAGNOSIS — M79674 Pain in right toe(s): Secondary | ICD-10-CM

## 2023-01-31 DIAGNOSIS — E119 Type 2 diabetes mellitus without complications: Secondary | ICD-10-CM | POA: Diagnosis not present

## 2023-01-31 DIAGNOSIS — M79675 Pain in left toe(s): Secondary | ICD-10-CM | POA: Diagnosis not present

## 2023-01-31 NOTE — Progress Notes (Signed)
This patient returns to my office for at risk foot care.  This patient requires this care by a professional since this patient will be at risk due to having thrombocytopenia, CKD, and diabetes.  This patient is unable to cut nails himself since the patient cannot reach his nails.These nails are painful walking and wearing shoes.  This patient presents for at risk foot care today. . Patient is taking plavix.  General Appearance  Alert, conversant and in no acute stress.  Vascular  Dorsalis pedis and posterior tibial  pulses are  weakly palpable  bilaterally.  Capillary return is within normal limits  bilaterally. Temperature is within normal limits  bilaterally.  Neurologic  Senn-Weinstein monofilament wire test within normal limits  bilaterally. Muscle power within normal limits bilaterally.  Nails Thick disfigured discolored nails with subungual debris  from hallux to fifth toes bilaterally. No evidence of bacterial infection or drainage bilaterally.  Orthopedic  No limitations of motion  feet .  No crepitus or effusions noted.  No bony pathology or digital deformities noted.  HAV  B/L.  Skin  normotropic skin with no porokeratosis noted bilaterally.  No signs of infections or ulcers noted.     Onychomycosis  Pain in right toes  Pain in left toes  Consent was obtained for treatment procedures.   Mechanical debridement of nails 1-5  bilaterally performed with a nail nipper.  Filed with dremel without incident.    Return office visit    3 months                  Told patient to return for periodic foot care and evaluation due to potential at risk complications.   Gardiner Barefoot DPM

## 2023-02-12 ENCOUNTER — Encounter: Payer: Self-pay | Admitting: Cardiovascular Disease

## 2023-02-12 NOTE — Progress Notes (Unsigned)
Cardiology Office Note   Date:  02/13/2023   ID:  Collin [name] 09/09/1925, MRN [id number]  PCP:  Binnie Rail, MD  Cardiologist:  Mertie Moores, MD , previous patient of Dr. Ron Parker  Chief Complaint  Patient presents with   Coronary Artery Disease        Hyperlipidemia   Problem List 1. CAD- s/p stenting in 2005.  2. Essential HTN       Collin [name] is a 89 y.o. male who presents for follow-up of coronary disease.  Had a stent placed by Dr. Olevia Perches in 2005.  No CP , no dyspnea   Previously worked  in the [employer] for [place] active Walks 30 minutes a day on the treadmill.   Goes bowling twice a week Took Atorvastatin for years but stopped due to leg pain .  Last lipids were drawn in Dec.  - showed markedly elevated Trig levels and high chol levels.   April 21, 2017:  Collin [name] has a hx of CAD Hyperlipidemia Was recently diagnosed with DM and He has greatly improved his diet. He's cut back on lots of carbohydrates. He's lost about 5 pounds. He's noticed that his blood pressure has been low. This morning he took only half of his valsartan tablet.  Takes SBE prophylasix - has done so for years.  No prosthetic valve and no artificial joints  Jan. 8, 2019:  Collin [name] is seen back for follow-up visit for his coronary artery disease and diabetes mellitus.   He walks on the treadmill 4-5 times a week for 30 minutes at a time.  He denies any angina while walking the treadmill.  He still bowls once or twice a week.  He notes that his blood pressure has been somewhat variable.  It seems to go up in the evenings.  Eats prepared meals every meal.      July 24, 2018:    Collin [name] is seen today for follow up of his CAD Has pain in his calves with walking -  Resolves with walking  Had a [facility] screening of PAD screening  No CP  Still works out on the treadmill, bowl one a week   Recent lab work shows that his triglyceride level is markedly  elevated at 613.  Total cholesterol is 179.  His LDL is 77. Eats lots of cereal, through the day .     January 21, 2019:  Strained his chest recently while lifting a large box Has had some shortness of breath  Saw his primary   Echo shows a mildly depressed left ventricular systolic function with an ejection fraction of 45 to 50%.  There is hypokinesis of the inferior wall.  He has grade 1 diastolic dysfunction.  He has mild mitral regurgitation. The pain was clearly MSK.    Feeling better.   Breathing seems to be better .   November 20, 2019:   Collin [name] is seen today for a follow-up visit. He was recently seen by [name], PA with symptoms consistent with unstable angina.  He wanted to discuss it further and is seeing me today for further evaluation.  Pain / heaviness  - center of chest  Worse with walking or carrying groceries from car Had a stent in 2005.  This current pain is similar to his angina .  No diaphoresis,  No syncope.  No presyncope  Last for several minutes.  Or until he stops to  rest  Was raking leaves for 20 min and had angina   January 22, 2020:  Collin [name] seen back for a follow-up visit.  He was complaining of angina when I saw him back in November, 2020. He was found to have significant disease in his right coronary artery and circumflex artery.  He had a stent placed in his mid right coronary artery as well as the posterior descending artery.  He also had a stent placed in the mid circumflex artery. The left anterior descending artery has a moderate to severe stenosis that has been treated medically. He is greatly improved.  The chest pressure has resolved.  Has not had to take any NTG. Still has some dyspnea.  Walks on the treadmill without any issues.   Was not able to do cardiac rehab due to covid.   He saw Richardson Dopp, PA on January 4.  His renal function was fairly stable.  Creatinine was 1.72.  Potassium is 4.5.  Total cholesterol is 118.  HDL is 42.   Triglyceride levels 223.  The LDL is 41.   July 27, 2020:  Collin [name] is seen today for follow-up of his coronary artery disease.  He has had a stent placed in his right coronary artery as well as the posterior descending artery.  He also has a stent in his left circumflex artery. Looks great for 89.   Has been  out bowling this am.   Is very active  Breathing is good Would like to try a glass of wine at night - I gave the [facility]    Jan. 31, 2022: Collin [name] is seen for follow up of his CAD, Still using his treadmill,  Goes bowling  Has some chest soreness if he walks for a long time  Might last 1-2 minutes.   Has not worsened in a long time  We discussed symptoms that would likely warrant a close look ( pain lasting lnger, occurring sooner, more severe pain )   His trigs are elevated.   Will start fenofibrate 48 mg a day   LDL looks good   Feb. 17, 2023: Collin [name] is seen today for follow up of his CAD Overall doing well.   Has noticed some generalized weakness in his legs.   Feb. 19, 2024  Collin [name] is seen today for follow up of his CAD, Has some generalized weakness in his legs  He stopped his statin,  feels better   He is going to try taking his statin medication at a lower dose to see if the pains come back.  If his pains do return, I have asked him to call us so that we can start him on a different medication that is not a statin   He can he complains of pain behind his right knee.  Denies any leg swelling      Past Medical History:  Diagnosis Date   Allergy    Anemia    Arthritis    gout   Bladder cancer (HCC)    BPH (benign prostatic hyperplasia)    CAD (coronary artery disease) 2005   a. diaphragmatic MI s/p DES to RCA 2005, residual disease. b. [place] 11/2019 s/p DES to PDA, mRCA, mid Cx.   Cancer ([place]) 02/28/2001   Left ureter cancer/bladder   Carcinoma, renal cell ([place])    Cardiomyopathy ([place])    a. EF 45-50% by echo 12/2018.   Carotid artery disease ([place])     Doppler, September,  2010, normal, no carotid artery disease   CKD (chronic kidney disease), stage III (HCC)    COPD (chronic obstructive pulmonary disease) ([place])    Dysfunction of eustachian tube    Ejection fraction    EF 55%, echo, January, 2009   Encounter for long-term (current) use of other medications    Enlarged prostate    GERD (gastroesophageal reflux disease)    Gynecomastia    b/l   HTN (hypertension)    Hx of cystoscopy 11/02/12   negative   Hx of skin cancer, basal cell    Hypercholesterolemia    Hyperlipidemia    Low back pain syndrome    Myocardial infarction ([place])    hx acute   Nocturia    Organic impotence    Pulmonary nodule    Chest CT, followed   Reactive airway disease    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   Staph infection 04/1997   Thrombocytopenia [facility])     Past Surgical History:  Procedure Laterality Date   BACK SURGERY  02/1996   bladder tumors  2003, 2004, 01/2009   removed   CARDIAC CATHETERIZATION     CHOLECYSTECTOMY  07/2003   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;  Service: Cardiovascular;  Laterality: N/A;   NEPHRECTOMY  02/2001   left kidney, ureter   osteomyelitis     staph aureus   URETERECTOMY  2003   for cancer    Patient Active Problem List   Diagnosis Date Noted   CAD (coronary artery disease)     Priority: High   Essential hypertension 04/23/2008    Priority: High   Dyslipidemia 01/25/2007    Priority: High   Calf muscle weakness 01/30/2023    Acute sinus infection 10/18/2022   Unstable angina (HCC)    Shortness of breath 11/01/2019   Thrombocytopenia (HCC)    Skin cancer    Pulmonary nodule    Nocturia    Myocardial infarction (HCC)    Low back pain syndrome    COPD (chronic obstructive pulmonary disease) (HCC)    BPH (benign prostatic hyperplasia)    Bladder cancer (HCC)    Arthritis    CKD (chronic kidney disease), stage III 07/10/2017   Diabetes ([place]) 04/15/2017   Fatty liver 06/18/2016   Rash and nonspecific skin eruption 06/13/2016   H/O bacterial endocarditis 04/06/2016   Myalgia and myositis 12/18/2015   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   Gynecomastia    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   Dermatochalasis 07/04/2012   Bilateral pseudophakia 07/04/2012   Spinal stenosis, lumbar 05/12/2012   Keratitis 05/01/2012   Epiretinal membrane 05/01/2012   Degenerative drusen 05/01/2012   Carotid artery disease ([place])    Fuchs' corneal dystrophy 01/04/2012   CERVICAL RADICULOPATHY, LEFT 12/07/2009   SKIN CANCER, HX OF 12/07/2009   Renal cell carcinoma ([place]) 07/18/2009   ANEMIA, 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   GERD 07/18/2009   History of cancer, left ureteral 02/28/2001  Current Outpatient Medications  Medication Sig Dispense Refill   acetaminophen (TYLENOL) 500 MG tablet Take 500 mg by mouth every 6 (six) hours as needed for moderate pain or headache.      alfuzosin (UROXATRAL) 10 MG 24 hr tablet Take 10 mg by mouth daily.      aspirin EC 81 MG tablet Take 1 tablet (81 mg total) by mouth daily.     carvedilol (COREG) 3.125 MG tablet TAKE 1 TABLET TWICE A DAY 180 tablet 0   clopidogrel (PLAVIX) 75 MG tablet TAKE 1 TABLET DAILY 90 tablet 3   fenofibrate (TRICOR) 48 MG tablet Take 1 tablet (48 mg total) by mouth daily. 90 tablet 0   finasteride (PROSCAR) 5 MG tablet Take 5 mg by mouth daily.     fluocinonide ointment (LIDEX) 0.05 % SMARTSIG:1 Sparingly  Topical Twice Daily     losartan (COZAAR) 50 MG tablet TAKE 1/2 TABLET IN THE     MORNING AND AT BEDTIME 90 tablet 3   metroNIDAZOLE (METROGEL) 0.75 % gel Apply topically 2 (two) times daily.     Multiple Vitamin (MULTIVITAMIN) tablet Take 1 tablet by mouth daily.     nitroGLYCERIN (NITROSTAT) 0.4 MG SL tablet Place 1 tablet (0.4 mg total) under the tongue every 5 (five) minutes as needed for chest pain. 25 tablet 6   rosuvastatin (CRESTOR) 5 MG tablet TAKE 1 TABLET AT BEDTIME 90 tablet 0   sodium chloride (OCEAN) 0.65 % SOLN nasal spray Place 1 spray into both nostrils as needed for congestion.     triamcinolone ointment (KENALOG) 0.1 % Apply 1 application topically daily.      No current facility-administered medications for this visit.    Allergies:   Crestor [rosuvastatin], Atorvastatin, Peanut oil, and Trandolapril    Social History:  The patient  reports that he quit smoking about 31 years ago. His smoking use included pipe. He has quit using smokeless tobacco. He reports that he does not drink alcohol and does not use drugs.   Family History:  The patient's family history includes Coronary artery disease in his brother; Diabetes in his brother, mother, sister, and sister; Drug abuse in his brother; Heart failure (age of onset: 32) in his mother.    ROS:  Please see the history of present illness.  Patient denies fever, chills, headache, sweats, rash, change in vision, change in hearing, chest pain, cough, nausea or vomiting, urinary symptoms. All other systems are reviewed and are negative.    Physical Exam: Blood pressure 118/62, pulse 64, height 5' 7"$  (1.702 m), weight 167 lb 9.6 oz (76 kg), SpO2 99 %.       GEN:  Well nourished, elderly male  in no acute distress HEENT: Normal NECK: No JVD; No carotid bruits LYMPHATICS: No lymphadenopathy CARDIAC: RRR , no murmurs, rubs, gallops RESPIRATORY:  Clear to auscultation without rales, wheezing or rhonchi  ABDOMEN: Soft,  non-tender, non-distended MUSCULOSKELETAL:   tender swelling behind his R knee .   SKIN: Warm and dry NEUROLOGIC:  Alert and oriented x 3     EKG:       Recent Labs: 01/30/2023: ALT 18; BUN 25; Creatinine, Ser 1.78; Hemoglobin 13.6; Platelets 118.0; Potassium 5.1; Sodium 138    Lipid Panel    Component Value Date/Time   CHOL 117 01/30/2023 1047   CHOL 139 01/18/2021 0940   TRIG 216.0 (H) 01/30/2023 1047   HDL 41.30 01/30/2023 1047   HDL 39 (L) 01/18/2021 0940   CHOLHDL  3 01/30/2023 1047   VLDL 43.2 (H) 01/30/2023 1047   LDLCALC 53 01/18/2021 0940   LDLDIRECT 49.0 01/30/2023 1047      Wt Readings from Last 3 Encounters:  02/13/23 167 lb 9.6 oz (76 kg)  01/30/23 169 lb (76.7 kg)  10/18/22 167 lb (75.8 kg)      Current medicines are reviewed. The patient has a very good understanding of his medications. No changes are to be made.       ASSESSMENT AND PLAN:  1. CAD-   ECG is stable.  He is not having any episodes of angina.     2. Essential HTN-   blood pressure is well-controlled.   3. Hyperlipidemia -   lipids were previously well-controlled.  He is found that he had some muscle aches and pains.  He stopped his statin medication and feels a little bit better.  He will restart at half dose and see if this is tolerable.  Recheck lipids and ALT in 6 months. .  4.  Chronic kidney disease:   .    Mertie Moores, MD  02/13/2023 4:09 PM    [facility] [place],  [address], [place]  [address] Pager [phone] Phone: [phone]; Fax: [phone]

## 2023-02-13 ENCOUNTER — Encounter: Payer: Self-pay | Admitting: Cardiovascular Disease

## 2023-02-13 ENCOUNTER — Other Ambulatory Visit: Payer: Self-pay | Admitting: Cardiovascular Disease

## 2023-02-13 ENCOUNTER — Ambulatory Visit: Payer: Medicare Other | Attending: Cardiovascular Disease | Admitting: Cardiovascular Disease

## 2023-02-13 VITALS — BP 118/62 | HR 64 | Ht 67.0 in | Wt 167.6 lb

## 2023-02-13 DIAGNOSIS — E782 Mixed hyperlipidemia: Secondary | ICD-10-CM | POA: Diagnosis not present

## 2023-02-13 DIAGNOSIS — I2510 Atherosclerotic heart disease of native coronary artery without angina pectoris: Secondary | ICD-10-CM

## 2023-02-13 DIAGNOSIS — M7121 Synovial cyst of popliteal space [Baker], right knee: Secondary | ICD-10-CM | POA: Diagnosis not present

## 2023-02-13 NOTE — Patient Instructions (Signed)
Medication Instructions:  Your physician recommends that you continue on your current medications as directed. Please refer to the Current Medication list given to you today.  *If you need a refill on your cardiac medications before your next appointment, please call your pharmacy*   Lab Work: Lipids, ALT in 6 months If you have labs (blood work) drawn today and your tests are completely normal, you will receive your results only by: [place] (if you have MyChart) OR A paper copy in the mail If you have any lab test that is abnormal or we need to change your treatment, we will call you to review the results.   Testing/Procedures: Ultrasound of Right lower extremitiy Your physician has requested that you have a lower or upper extremity venous duplex. This test is an ultrasound of the veins in the legs or arms. It looks at venous blood flow that carries blood from the heart to the legs or arms. Allow one hour for a Lower Venous exam. Allow thirty minutes for an Upper Venous exam. There are no restrictions or special instructions.  Follow-Up: At [facility], you and your health needs are our priority.  As part of our continuing mission to provide you with exceptional heart care, we have created designated Provider Care Teams.  These Care Teams include your primary Cardiologist (physician) and Advanced Practice Providers (APPs -  Physician Assistants and Nurse Practitioners) who all work together to provide you with the care you need, when you need it.  Your next appointment:   6 month(s)  Provider:   Mertie Moores, MD  or APP

## 2023-02-15 ENCOUNTER — Ambulatory Visit (HOSPITAL_COMMUNITY)
Admission: RE | Admit: 2023-02-15 | Discharge: 2023-02-15 | Disposition: A | Discharge status: Home / Self Care | Payer: Medicare Other | Source: Ambulatory Visit | Attending: Cardiovascular Disease | Admitting: Cardiovascular Disease

## 2023-02-15 DIAGNOSIS — I2510 Atherosclerotic heart disease of native coronary artery without angina pectoris: Secondary | ICD-10-CM | POA: Diagnosis not present

## 2023-02-15 DIAGNOSIS — M7121 Synovial cyst of popliteal space [Baker], right knee: Secondary | ICD-10-CM | POA: Diagnosis not present

## 2023-02-15 NOTE — Progress Notes (Signed)
Lower extremity venous duplex has been completed.   Preliminary results in CV Proc.   Collin [name] 02/15/2023 4:19 PM

## 2023-02-25 ENCOUNTER — Other Ambulatory Visit: Payer: Self-pay | Admitting: Cardiovascular Disease

## 2023-03-15 ENCOUNTER — Encounter: Payer: Self-pay | Admitting: Internal Medicine

## 2023-03-15 ENCOUNTER — Ambulatory Visit (INDEPENDENT_AMBULATORY_CARE_PROVIDER_SITE_OTHER): Payer: Medicare Other

## 2023-03-15 ENCOUNTER — Ambulatory Visit (INDEPENDENT_AMBULATORY_CARE_PROVIDER_SITE_OTHER): Payer: Medicare Other | Admitting: Internal Medicine

## 2023-03-15 VITALS — BP 122/64 | HR 73 | Temp 98.9°F | Ht 67.0 in | Wt 164.0 lb

## 2023-03-15 DIAGNOSIS — U071 COVID-19: Secondary | ICD-10-CM | POA: Insufficient documentation

## 2023-03-15 DIAGNOSIS — R051 Acute cough: Secondary | ICD-10-CM

## 2023-03-15 LAB — POCT INFLUENZA A/B
Influenza A, POC: NEGATIVE
Influenza B, POC: NEGATIVE

## 2023-03-15 LAB — POC COVID19 BINAXNOW: SARS Coronavirus 2 Ag: POSITIVE — AB

## 2023-03-15 MED ORDER — PROMETHAZINE-DM 6.25-15 MG/5ML PO SYRP: 5.0000 mL | ORAL_SOLUTION | Freq: Four times a day (QID) | ORAL | 0 refills | Status: AC | PRN

## 2023-03-15 MED ORDER — NIRMATRELVIR/RITONAVIR (PAXLOVID) TABLET (RENAL DOSING): 2.0000 | ORAL_TABLET | Freq: Two times a day (BID) | ORAL | 0 refills | Status: AC

## 2023-03-15 NOTE — Progress Notes (Signed)
Subjective:  Patient ID: Collin [name], male    DOB: 1925-03-31  Age: 89 y.o. MRN: [id number]  CC: URI   HPI Collin [name] presents for a 2 day history of NP cough, ST, and chills.  Outpatient Medications Prior to Visit  Medication Sig Dispense Refill   acetaminophen (TYLENOL) 500 MG tablet Take 500 mg by mouth every 6 (six) hours as needed for moderate pain or headache.      alfuzosin (UROXATRAL) 10 MG 24 hr tablet Take 10 mg by mouth daily.      aspirin EC 81 MG tablet Take 1 tablet (81 mg total) by mouth daily.     carvedilol (COREG) 3.125 MG tablet TAKE 1 TABLET TWICE A DAY 180 tablet 0   clopidogrel (PLAVIX) 75 MG tablet TAKE 1 TABLET DAILY 90 tablet 3   fenofibrate (TRICOR) 48 MG tablet TAKE 1 TABLET DAILY 90 tablet 3   finasteride (PROSCAR) 5 MG tablet Take 5 mg by mouth daily.     fluocinonide ointment (LIDEX) 0.05 % SMARTSIG:1 Sparingly Topical Twice Daily     losartan (COZAAR) 50 MG tablet TAKE 1/2 TABLET IN THE     MORNING AND AT BEDTIME 90 tablet 3   metroNIDAZOLE (METROGEL) 0.75 % gel Apply topically 2 (two) times daily.     Multiple Vitamin (MULTIVITAMIN) tablet Take 1 tablet by mouth daily.     nitroGLYCERIN (NITROSTAT) 0.4 MG SL tablet Place 1 tablet (0.4 mg total) under the tongue every 5 (five) minutes as needed for chest pain. 25 tablet 6   rosuvastatin (CRESTOR) 5 MG tablet TAKE 1 TABLET AT BEDTIME 90 tablet 0   sodium chloride (OCEAN) 0.65 % SOLN nasal spray Place 1 spray into both nostrils as needed for congestion.     triamcinolone ointment (KENALOG) 0.1 % Apply 1 application topically daily.      No facility-administered medications prior to visit.    ROS Review of Systems  Constitutional:  Positive for chills. Negative for activity change, appetite change, fatigue and fever.  HENT:  Positive for sore throat. Negative for trouble swallowing.   Respiratory:  Positive for cough. Negative for chest tightness, shortness of breath and wheezing.    Cardiovascular:  Negative for chest pain, palpitations and leg swelling.  Gastrointestinal:  Positive for constipation. Negative for abdominal pain, nausea and vomiting.  Genitourinary: Negative.   Musculoskeletal: Negative.  Negative for arthralgias and myalgias.  Skin: Negative.  Negative for color change and rash.  Neurological: Negative.  Negative for dizziness and weakness.  Hematological:  Negative for adenopathy. Does not bruise/bleed easily.    Objective:  BP 122/64 (BP Location: Right Arm, Patient Position: Sitting, Cuff Size: Large)   Pulse 73   Temp 98.9 F (37.2 C) (Oral)   Ht 5\' 7"  (1.702 m)   Wt 164 lb (74.4 kg)   SpO2 91%   BMI 25.69 kg/m   BP Readings from Last 3 Encounters:  03/15/23 122/64  02/13/23 118/62  01/30/23 130/70    Wt Readings from Last 3 Encounters:  03/15/23 164 lb (74.4 kg)  02/13/23 167 lb 9.6 oz (76 kg)  01/30/23 169 lb (76.7 kg)    Physical Exam Vitals reviewed.  Constitutional:      General: He is not in acute distress.    Appearance: He is not ill-appearing, toxic-appearing or diaphoretic.  HENT:     Nose: Nose normal.     Mouth/Throat:     Mouth: Mucous membranes are moist.  Pharynx: No oropharyngeal exudate or posterior oropharyngeal erythema.  Eyes:     General: No scleral icterus.    Conjunctiva/sclera: Conjunctivae normal.  Cardiovascular:     Rate and Rhythm: Normal rate and regular rhythm.     Pulses: Normal pulses.     Heart sounds: No murmur heard.    No friction rub. No gallop.  Pulmonary:     Effort: Pulmonary effort is normal.     Breath sounds: No stridor. No wheezing, rhonchi or rales.  Abdominal:     General: Abdomen is flat.     Palpations: There is no mass.     Tenderness: There is no abdominal tenderness. There is no guarding.     Hernia: No hernia is present.  Musculoskeletal:     Cervical back: Neck supple.     Right lower leg: No edema.     Left lower leg: No edema.  Lymphadenopathy:      Cervical: No cervical adenopathy.  Skin:    General: Skin is warm and dry.  Neurological:     General: No focal deficit present.     Mental Status: He is alert.  Psychiatric:        Mood and Affect: Mood normal.        Behavior: Behavior normal.     Lab Results  Component Value Date   WBC 4.7 01/30/2023   HGB 13.6 01/30/2023   HCT 39.8 01/30/2023   PLT 118.0 (L) 01/30/2023   GLUCOSE 106 (H) 01/30/2023   CHOL 117 01/30/2023   TRIG 216.0 (H) 01/30/2023   HDL 41.30 01/30/2023   LDLDIRECT 49.0 01/30/2023   LDLCALC 53 01/18/2021   ALT 18 01/30/2023   AST 19 01/30/2023   NA 138 01/30/2023   K 5.1 01/30/2023   CL 104 01/30/2023   CREATININE 1.78 (H) 01/30/2023   BUN 25 (H) 01/30/2023   CO2 28 01/30/2023   TSH 3.40 07/08/2019   INR 1.04 11/04/2009   HGBA1C 6.4 01/30/2023   MICROALBUR <0.7 01/08/2018    VAS [place] LOWER EXTREMITY VENOUS (DVT)  Result Date: 02/15/2023  Lower Venous DVT Study Patient Name:  Collin [name]  Date of Exam:   02/15/2023 Medical Rec #: [id number]      Accession #:    [id number] Date of Birth: October 16, 1925      Patient Gender: M Patient Age:   89 years Exam Location:  [facility] Procedure:      VAS [place] LOWER EXTREMITY VENOUS (DVT) Referring Phys: Mertie Moores --------------------------------------------------------------------------------  Indications: Popliteal cyst.  Performing Technologist: Archie Patten RVS  Examination Guidelines: A complete evaluation includes B-mode imaging, spectral Doppler, color Doppler, and power Doppler as needed of all accessible portions of each vessel. Bilateral testing is considered an integral part of a complete examination. Limited examinations for reoccurring indications may be performed as noted. The reflux portion of the exam is performed with the patient in reverse Trendelenburg.  +---------+---------------+---------+-----------+----------+--------------+ RIGHT     CompressibilityPhasicitySpontaneityPropertiesThrombus Aging +---------+---------------+---------+-----------+----------+--------------+ CFV      Full           Yes      Yes                                 +---------+---------------+---------+-----------+----------+--------------+ SFJ      Full                                                        +---------+---------------+---------+-----------+----------+--------------+  FV Prox  Full                                                        +---------+---------------+---------+-----------+----------+--------------+ FV Mid   Full                                                        +---------+---------------+---------+-----------+----------+--------------+ FV DistalFull                                                        +---------+---------------+---------+-----------+----------+--------------+ PFV      Full                                                        +---------+---------------+---------+-----------+----------+--------------+ POP      Full           Yes      Yes                                 +---------+---------------+---------+-----------+----------+--------------+ PTV      Full                                                        +---------+---------------+---------+-----------+----------+--------------+ PERO     Full                                                        +---------+---------------+---------+-----------+----------+--------------+   +----+---------------+---------+-----------+----------+--------------+ LEFTCompressibilityPhasicitySpontaneityPropertiesThrombus Aging +----+---------------+---------+-----------+----------+--------------+ CFV Full           Yes      Yes                                 +----+---------------+---------+-----------+----------+--------------+     Summary: RIGHT: - There is no evidence of deep vein thrombosis in the lower  extremity.  - No cystic structure found in the popliteal fossa.  LEFT: - No evidence of common femoral vein obstruction.  *See table(s) above for measurements and observations. Electronically signed by Jamelle Haring on 02/15/2023 at 10:07:40 PM.    Final    DG Chest 2 View  Result Date: 03/15/2023 CLINICAL DATA:  Cough and chills EXAM: CHEST - 2 VIEW COMPARISON:  Chest x-ray dated December 18, 2020 FINDINGS: The heart size and mediastinal contours are within normal limits. Both lungs are clear. The visualized skeletal structures are unremarkable. IMPRESSION: No active cardiopulmonary disease.  Electronically Signed   By: Yetta Glassman M.D.   On: 03/15/2023 16:19     Assessment & Plan:  Acute cough- Chest x-ray is negative for infiltrate.  Testing for influenza is negative.  COVID is positive.  Will treat with an antiviral and a symptom reliever. -     POC COVID-19 BinaxNow -     POCT Influenza A/B -     DG Chest 2 View; Future -     Promethazine-DM; Take 5 mLs by mouth 4 (four) times daily as needed for up to 8 days for cough.  Dispense: 118 mL; Refill: 0  COVID -     nirmatrelvir/ritonavir (renal dosing); Take 2 tablets by mouth 2 (two) times daily for 5 days. (Take nirmatrelvir 150 mg one tablet twice daily for 5 days and ritonavir 100 mg one tablet twice daily for 5 days) Patient GFR is 41  Dispense: 20 tablet; Refill: 0 -     Promethazine-DM; Take 5 mLs by mouth 4 (four) times daily as needed for up to 8 days for cough.  Dispense: 118 mL; Refill: 0     Follow-up: Return if symptoms worsen or fail to improve.  Scarlette Calico, MD

## 2023-03-15 NOTE — Patient Instructions (Signed)
COVID-19 COVID-19 is an infection caused by a virus called SARS-CoV-2. Most people who get COVID-19 have mild to moderate symptoms. Some have little to no symptoms. In others, the virus may cause a severe infection. What are the causes? COVID-19 is caused by a coronavirus. The virus may be in the air as droplets or as tiny specks of fluid (aerosols). It may also be on surfaces. You may catch the virus if you: Breathe in droplets when a person with COVID-19 breathes, speaks, sings, coughs, or sneezes. Touch something that has the virus on it and then touch your mouth, nose, or eyes. What increases the risk? Risk for infection: You are more likely to get COVID-19 if: You are within 6 ft (1.8 m) of a person who has COVID-19 for 15 minutes or longer. You provide care to a person who has COVID-19. You are in close contact with others. This includes hugging, kissing, or sharing utensils. Risk for serious illness caused by COVID-19: You are more likely to get very ill from COVID-19 if: You have cancer. You have a long-term (chronic) disease. This may be: A chronic lung disease, such as pulmonary embolism, chronic obstructive pulmonary disease (COPD), or cystic fibrosis. A disease that affects your body's defense system (immune system). If you have a weak immune system, you are said to be immunocompromised. A serious heart condition, such as heart failure, coronary artery disease, or cardiomyopathy. Diabetes. Chronic kidney disease. A liver disease, such as cirrhosis, nonalcoholic fatty liver disease, alcoholic liver disease, or autoimmune hepatitis. You are obese. You are pregnant or were just pregnant. You have sickle cell disease. What are the signs or symptoms? Symptoms of COVID-19 can range from mild to severe. They may appear any time from 2 to 14 days after you are exposed. They include: Fever or chills. Shortness of breath or trouble breathing. Feeling tired. Headaches, body aches, or  muscle aches. A runny or stuffy nose. Sneezing, coughing, or a sore throat. New loss of taste or smell. You may also have stomach problems, such as nausea, vomiting, or diarrhea. In some cases, you may not have any symptoms. How is this diagnosed? COVID-19 may be diagnosed by testing a sample to check for the virus. The most common tests are the PCR test and the antigen test. Tests may be done in the lab or at home. They include: Using a swab to take a sample of fluid from your nose. Testing a sample of saliva from your mouth. Testing a sample of mucus from your lungs (sputum). How is this treated? Treatment for COVID-19 depends on how severe your condition is. Mild symptoms can be treated at home. You should rest, drink fluids, and take over-the-counter medicine. If you have symptoms and risk factors, you may be prescribed a medicine that fights viruses (antiviral). Severe symptoms may be treated in a hospital intensive care unit (ICU). Treatment may include: Extra oxygen given through a tube in the nose, a face mask, or a hood. Medicines. These may include: Antivirals, such as remdesivir. Anti-inflammatories, such as corticosteroids. These help reduce inflammation. Antithrombotics. These help prevent or treat blood clots. Convalescent plasma. This helps boost your immune system. Prone positioning. This is when you are laid on your stomach to help oxygen get into your lungs. Infection control measures. If you are at risk for a more serious illness, your health care provider may prescribe two medicines to help your immune system protect you. These are called long-acting monoclonal antibodies. They are given together   every 6 months. How is this prevented? To protect yourself: Get the vaccine or vaccine series if you meet the guidelines. You can even get the vaccine while you are pregnant or making breast milk (lactating). Get an added dose of the vaccine if you are immunocompromised. This  applies if you have had an organ transplant or if you have a condition that affects your immune system. You should get the added dose 4 weeks after you got the first one. If you get an mRNA vaccine, you will need to get 3 doses. Talk to your provider about getting experimental monoclonal antibodies. This treatment can help prevent severe illness. It may be given to you if: You are immunocompromised. You cannot get the vaccine. You may not get the vaccine if you have a severe allergic reaction to it or to what it is made of. You are not fully vaccinated. You are in a place where there is COVID-19 and: You are in close contact with someone who has COVID-19. You are at high risk of being exposed. You are at risk of illness from new variants of the virus. To protect others: If you have symptoms of COVID-19, take steps to stop the virus from spreading. Stay home. Leave your house only to get medical care. Do not use public transit. Do not travel while you are sick. Wash your hands often with soap and water for at least 20 seconds. If soap and water are not available, use alcohol-based hand sanitizer. Make sure that all people in your household wash their hands well and often. Cough or sneeze into a tissue or your sleeve or elbow. Do not cough or sneeze into your hand or into the air. Where to find more information Centers for Disease Control and Prevention (CDC): cdc.gov World Health Organization (WHO): who.int Get help right away if: You have trouble breathing. You have pain or pressure in your chest. You are confused. Your lips or fingernails turn blue. You have trouble waking from sleep. Your symptoms get worse. These symptoms may be an emergency. Get help right away. Call 911. Do not wait to see if the symptoms will go away. Do not drive yourself to the hospital. This information is not intended to replace advice given to you by your health care provider. Make sure you discuss any  questions you have with your health care provider. Document Revised: 08/26/2022 Document Reviewed: 08/26/2022 Elsevier Patient Education  2023 [employer]  

## 2023-04-05 ENCOUNTER — Other Ambulatory Visit: Payer: Self-pay | Admitting: Cardiovascular Disease

## 2023-05-02 ENCOUNTER — Ambulatory Visit (INDEPENDENT_AMBULATORY_CARE_PROVIDER_SITE_OTHER): Payer: Medicare Other | Admitting: Podiatry

## 2023-05-02 ENCOUNTER — Encounter: Payer: Self-pay | Admitting: Podiatry

## 2023-05-02 DIAGNOSIS — M79674 Pain in right toe(s): Secondary | ICD-10-CM | POA: Diagnosis not present

## 2023-05-02 DIAGNOSIS — B351 Tinea unguium: Secondary | ICD-10-CM | POA: Diagnosis not present

## 2023-05-02 DIAGNOSIS — M79675 Pain in left toe(s): Secondary | ICD-10-CM | POA: Diagnosis not present

## 2023-05-02 DIAGNOSIS — E119 Type 2 diabetes mellitus without complications: Secondary | ICD-10-CM

## 2023-05-02 NOTE — Progress Notes (Signed)
This patient returns to my office for at risk foot care.  This patient requires this care by a professional since this patient will be at risk due to having thrombocytopenia, CKD, and diabetes.  This patient is unable to cut nails himself since the patient cannot reach his nails.These nails are painful walking and wearing shoes.  This patient presents for at risk foot care today. . Patient is taking plavix.  General Appearance  Alert, conversant and in no acute stress.  Vascular  Dorsalis pedis and posterior tibial  pulses are  weakly palpable  bilaterally.  Capillary return is within normal limits  bilaterally. Temperature is within normal limits  bilaterally.  Neurologic  Senn-Weinstein monofilament wire test within normal limits  bilaterally. Muscle power within normal limits bilaterally.  Nails Thick disfigured discolored nails with subungual debris  from hallux to fifth toes bilaterally. No evidence of bacterial infection or drainage bilaterally.  Orthopedic  No limitations of motion  feet .  No crepitus or effusions noted.  No bony pathology or digital deformities noted.  HAV  B/L.  Skin  normotropic skin with no porokeratosis noted bilaterally.  No signs of infections or ulcers noted.     Onychomycosis  Pain in right toes  Pain in left toes  Consent was obtained for treatment procedures.   Mechanical debridement of nails 1-5  bilaterally performed with a nail nipper.  Filed with dremel without incident.    Return office visit    3 months                  Told patient to return for periodic foot care and evaluation due to potential at risk complications.   Lylee Corrow DPM  

## 2023-07-07 ENCOUNTER — Ambulatory Visit (INDEPENDENT_AMBULATORY_CARE_PROVIDER_SITE_OTHER): Payer: Medicare Other

## 2023-07-07 VITALS — BP 130/68 | Ht 67.5 in | Wt 170.8 lb

## 2023-07-07 DIAGNOSIS — Z0000 Encounter for general adult medical examination without abnormal findings: Secondary | ICD-10-CM

## 2023-07-07 NOTE — Progress Notes (Signed)
Subjective:   Collin [name] is a 89 y.o. male who presents for Medicare Annual/Subsequent preventive examination.  Visit Complete: In person  Patient Medicare AWV questionnaire was completed by the patient on 06/30/2023; I have confirmed that all information answered by patient is correct and no changes since this date.  Review of Systems    Cardiac Risk Factors include: advanced age (>15men, >4 women);diabetes mellitus;hypertension;male gender;dyslipidemia;Other (see comment), Risk factor comments: Fatty liver, CKD, CAD, COPD     Objective:    Today's Vitals   07/07/23 1403 07/07/23 1404  BP: 130/68   Weight: 170 lb 12.8 oz (77.5 kg)   Height: 5' 7.5" (1.715 m)   PainSc:  4    Body mass index is 26.36 kg/m.     07/07/2023    2:16 PM 12/03/2021    1:49 PM 10/15/2020    1:48 PM 11/27/2019    9:42 AM 10/14/2019    2:49 PM 10/08/2018    1:52 PM 10/04/2017    2:36 PM  Advanced Directives  Does Patient Have a Medical Advance Directive? Yes Yes Yes Yes Yes Yes Yes  Type of Estate agent of [place];Living will Living will;Healthcare Power of [employer] Power of [place];Living will Living will;Healthcare Power of [employer] Power of [place];Living will Healthcare Power of [place];Living will Healthcare Power of [place];Living will  Does patient want to make changes to medical advance directive?  No - Patient declined No - Patient declined No - Patient declined     Copy of Healthcare Power of Attorney in Chart? No - copy requested No - copy requested No - copy requested Yes - validated most recent copy scanned in chart (See row information) No - copy requested No - copy requested No - copy requested    Current Medications (verified) Outpatient Encounter Medications as of 07/07/2023  Medication Sig   acetaminophen (TYLENOL) 500 MG tablet Take 500 mg by mouth every 6 (six) hours as needed for moderate pain or headache.    alfuzosin  (UROXATRAL) 10 MG 24 hr tablet Take 10 mg by mouth daily.    aspirin EC 81 MG tablet Take 1 tablet (81 mg total) by mouth daily.   carvedilol (COREG) 3.125 MG tablet TAKE 1 TABLET TWICE A DAY   clopidogrel (PLAVIX) 75 MG tablet TAKE 1 TABLET DAILY   fenofibrate (TRICOR) 48 MG tablet TAKE 1 TABLET DAILY   finasteride (PROSCAR) 5 MG tablet Take 5 mg by mouth daily.   fluocinonide ointment (LIDEX) 0.05 % SMARTSIG:1 Sparingly Topical Twice Daily   losartan (COZAAR) 50 MG tablet TAKE 1/2 TABLET IN THE     MORNING AND AT BEDTIME   metroNIDAZOLE (METROGEL) 0.75 % gel Apply topically 2 (two) times daily.   Multiple Vitamin (MULTIVITAMIN) tablet Take 1 tablet by mouth daily.   nitroGLYCERIN (NITROSTAT) 0.4 MG SL tablet Place 1 tablet (0.4 mg total) under the tongue every 5 (five) minutes as needed for chest pain.   rosuvastatin (CRESTOR) 5 MG tablet TAKE 1 TABLET AT BEDTIME   sodium chloride (OCEAN) 0.65 % SOLN nasal spray Place 1 spray into both nostrils as needed for congestion.   triamcinolone ointment (KENALOG) 0.1 % Apply 1 application topically daily.    No facility-administered encounter medications on file as of 07/07/2023.    Allergies (verified) Crestor [rosuvastatin], Atorvastatin, Peanut oil, and Trandolapril   History: Past Medical History:  Diagnosis Date   Allergy    Anemia    Arthritis  gout   Bladder cancer (HCC)    BPH (benign prostatic hyperplasia)    CAD (coronary artery disease) 2005   a. diaphragmatic MI s/p DES to RCA 2005, residual disease. b. [place] 11/2019 s/p DES to PDA, mRCA, mid Cx.   Cancer (HCC) 02/28/2001   Left ureter cancer/bladder   Carcinoma, renal cell (HCC)    Cardiomyopathy (HCC)    a. EF 45-50% by echo 12/2018.   Carotid artery disease (HCC)    Doppler, September, 2010, normal, no carotid artery disease   CKD (chronic kidney disease), stage III (HCC)    COPD (chronic obstructive pulmonary disease) (HCC)    Dysfunction of eustachian tube    Ejection  fraction    EF 55%, echo, January, 2009   Encounter for long-term (current) use of other medications    Enlarged prostate    GERD (gastroesophageal reflux disease)    Gynecomastia    b/l   HTN (hypertension)    Hx of cystoscopy 11/02/12   negative   Hx of skin cancer, basal cell    Hypercholesterolemia    Hyperlipidemia    Low back pain syndrome    Myocardial infarction (HCC)    hx acute   Nocturia    Organic impotence    Pulmonary nodule    Chest CT, followed   Reactive airway disease    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   Staph infection 04/1997   Thrombocytopenia [facility])    Past Surgical History:  Procedure Laterality Date   BACK SURGERY  02/1996   bladder tumors  2003, 2004, 01/2009   removed   CARDIAC CATHETERIZATION     CHOLECYSTECTOMY  07/2003   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   sutured, right   CORNEAL TRANSPLANT  10/2008   left   CORONARY STENT INTERVENTION  11/28/2019   CORONARY STENT INTERVENTION N/A 11/28/2019   Procedure: CORONARY STENT INTERVENTION;  Surgeon: Kathleene Hazel, MD;  Location: [facility] INVASIVE CV LAB;  Service: Cardiovascular;  Laterality: N/A;   LEFT HEART CATH AND CORONARY ANGIOGRAPHY N/A 11/27/2019   Procedure: LEFT HEART CATH AND CORONARY ANGIOGRAPHY;  Surgeon: Kathleene Hazel, MD;  Location: [facility] INVASIVE CV LAB;  Service: Cardiovascular;  Laterality: N/A;   NEPHRECTOMY  02/2001   left kidney, ureter   osteomyelitis     staph aureus   URETERECTOMY  2003   for cancer   Family History  Problem Relation Age of Onset   Heart failure Mother 2   Diabetes Mother    Coronary artery disease Brother    Diabetes Brother    Drug abuse Brother    Diabetes Sister    Diabetes Sister    Social History   Socioeconomic History   Marital status: Widowed    Spouse name: Not on file   Number of children: 1   Years of education: Not on file   Highest education level: Not on file  Occupational History   Occupation:  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  Tobacco Use   Smoking status: Former    Types: Pipe    Quit date: 12/27/1991    Years since quitting: 31.5   Smokeless tobacco: Former   Tobacco comments:    Quit 20-30 years ago as of 2012  Vaping Use   Vaping status: Never Used  Substance and Sexual Activity   Alcohol use: No    Alcohol/week: 0.0 standard drinks of alcohol   Drug use: No   Sexual  activity: Not Currently  Other Topics Concern   Not on file  Social History Narrative   30 minutes a day for 5 days a week   Social Determinants of Health   Financial Resource Strain: Low Risk  (07/07/2023)   Overall Financial Resource Strain (CARDIA)    Difficulty of Paying Living Expenses: Not hard at all  Food Insecurity: No Food Insecurity (07/07/2023)   Hunger Vital Sign    Worried About Running Out of Food in the Last Year: Never true    Ran Out of Food in the Last Year: Never true  Transportation Needs: No Transportation Needs (07/07/2023)   PRAPARE - Administrator, Civil Service (Medical): No    Lack of Transportation (Non-Medical): No  Physical Activity: Insufficiently Active (07/07/2023)   Exercise Vital Sign    Days of Exercise per Week: 3 days    Minutes of Exercise per Session: 30 min  Stress: No Stress Concern Present (07/07/2023)   [employer] of Occupational Health - Occupational Stress Questionnaire    Feeling of Stress : Not at all  Social Connections: Unknown (07/07/2023)   Social Connection and Isolation Panel [NHANES]    Frequency of Communication with Friends and Family: Three times a week    Frequency of Social Gatherings with Friends and Family: Once a week    Attends Religious Services: Not on file    Active Member of Clubs or Organizations: Yes    Attends Banker Meetings: More than 4 times per year    Marital Status: Widowed    Tobacco Counseling Counseling given: Not Answered Tobacco comments: Quit 20-30 years ago as of  2012   Clinical Intake:  Pre-visit preparation completed: Yes  Pain : 0-10 Pain Score: 4  Pain Type: Acute pain Pain Location: Back Pain Descriptors / Indicators: Constant, Discomfort, Nagging Pain Onset: More than a month ago Pain Frequency: Intermittent Pain Relieving Factors: Tylenol/topical Effect of Pain on Daily Activities: Helps some  Pain Relieving Factors: Tylenol/topical  BMI - recorded: 26.36 Nutritional Status: BMI 25 -29 Overweight Nutritional Risks: None Diabetes: Yes CBG done?: No (Per patient checks it at night) Did pt. bring in CBG monitor from home?: No  How often do you need to have someone help you when you read instructions, pamphlets, or other written materials from your doctor or pharmacy?: 1 - Never  Interpreter Needed?: No  Information entered by :: Shardea Cwynar, RMA   Activities of Daily Living    07/07/2023    2:10 PM 06/30/2023    9:19 AM  In your present state of health, do you have any difficulty performing the following activities:  Hearing? 1   Vision? 0   Walking or climbing stairs? 0 0  Dressing or bathing? 0 0  Doing errands, shopping? 0 0  Preparing Food and eating ? N N  Using the Toilet? N N  In the past six months, have you accidently leaked urine? N N  Do you have problems with loss of bowel control? N N  Managing your Medications? N N  Managing your Finances? N N  Housekeeping or managing your Housekeeping? N N    Patient Care Team: Pincus Sanes, MD as PCP - General (Internal Medicine) Nahser, Deloris Ping, MD as PCP - Cardiology (Cardiology) Luane School, OD (Optometry)  Indicate any recent Medical Services you may have received from other than [facility] providers in the past year (date may be approximate).     Assessment:  This is a routine wellness examination for [place].  Hearing/Vision screen Hearing Screening - Comments:: Per patient-not too good of hearing.  No hearing aides. Vision Screening - Comments:: Wears  eyeglasses  Dietary issues and exercise activities discussed:     Goals Addressed   None   Depression Screen    07/07/2023    2:20 PM 01/30/2023   10:10 AM 12/03/2021    2:04 PM 11/16/2021    2:00 PM 10/15/2020    1:45 PM 10/14/2019    3:06 PM 07/08/2019    1:47 PM  PHQ 2/9 Scores  PHQ - 2 Score 0 0 0 0 0 1 0  PHQ- 9 Score 3 2    4      Fall Risk    07/07/2023    2:17 PM 06/30/2023    9:19 AM 01/30/2023   10:09 AM 08/01/2022    3:00 PM 02/01/2022    2:00 PM  Fall Risk   Falls in the past year? 0 0 0 0 0  Comment    contionues to deny falls x 12 months; does not use assistive devices   Number falls in past yr: 0 0 0 0 0  Injury with Fall? 0  0 0 0  Comment     N/A- no falls reported  Risk for fall due to : No Fall Risks  No Fall Risks No Fall Risks No Fall Risks  Follow up Falls prevention discussed  Falls evaluation completed Falls prevention discussed Falls prevention discussed    MEDICARE RISK AT HOME:   TIMED UP AND GO:  Was the test performed?  Yes  Length of time to ambulate 10 feet: 15 sec Gait steady and fast without use of assistive device    Cognitive Function:    10/08/2018    1:19 PM 10/04/2017    2:36 PM 04/06/2016    3:09 PM  MMSE - Mini Mental State Exam  Not completed:   --  Orientation to time 5 5   Orientation to Place 5 5   Registration 3 3   Attention/ Calculation 5 5   Recall 3 3   Language- name 2 objects 2 2   Language- repeat 1 1   Language- follow 3 step command 3 3   Language- read & follow direction 1 1   Write a sentence 1 1   Copy design 1 1   Total score 30 30         07/07/2023    2:17 PM 10/14/2019    2:49 PM  6CIT Screen  What Year? 0 points 0 points  What month? 0 points 0 points  What time? 0 points 0 points  Count back from 20 0 points 0 points  Months in reverse 2 points 0 points  Repeat phrase 0 points 0 points  Total Score 2 points 0 points    Immunizations Immunization History  Administered Date(s)  Administered   Influenza Whole 09/17/2008, 07/26/2012, 09/10/2013   Influenza, High Dose Seasonal PF 08/29/2014, 10/07/2016, 08/04/2018, 08/15/2019   Influenza-Unspecified 08/31/2015, 09/21/2017, 10/22/2021   Moderna Covid-19 Vaccine Bivalent Booster 100yrs & up 10/22/2021   Moderna SARS-COV2 Booster Vaccination 10/24/2020   PFIZER Comirnaty(Gray Top)Covid-19 Tri-Sucrose Vaccine 12/30/2022   PFIZER(Purple Top)SARS-COV-2 Vaccination 01/07/2020, 02/04/2020, 10/24/2020, 05/28/2021   PNEUMOCOCCAL CONJUGATE-20 01/24/2022   Pneumococcal Conjugate-13 11/07/2016   Pneumococcal-Unspecified 12/26/1996   Tdap 07/17/2017   Zoster Recombinant(Shingrix) 10/06/2017, 12/11/2017    TDAP status: Up to date  Flu Vaccine status: Up to  date  Pneumococcal vaccine status: Up to date  Covid-19 vaccine status: Completed vaccines  Qualifies for Shingles Vaccine? Yes   Zostavax completed Yes   Shingrix Completed?: Yes  Screening Tests Health Maintenance  Topic Date Due   COVID-19 Vaccine (6 - 2023-24 season) 02/24/2023   INFLUENZA VACCINE  07/27/2023   HEMOGLOBIN A1C  07/31/2023   OPHTHALMOLOGY EXAM  12/01/2023   FOOT EXAM  02/01/2024   Medicare Annual Wellness (AWV)  07/06/2024   DTaP/Tdap/Td (2 - Td or Tdap) 07/18/2027   Pneumonia Vaccine 50+ Years old  Completed   Zoster Vaccines- Shingrix  Completed   HPV VACCINES  Aged Out    Health Maintenance  Health Maintenance Due  Topic Date Due   COVID-19 Vaccine (6 - 2023-24 season) 02/24/2023    Colorectal cancer screening: No longer required.   Lung Cancer Screening: (Low Dose CT Chest recommended if Age 96-80 years, 20 pack-year currently smoking OR have quit w/in 15years.) does not qualify.   Lung Cancer Screening Referral: N/A  Additional Screening:  Hepatitis C Screening: does not qualify;  Vision Screening: Recommended annual ophthalmology exams for early detection of glaucoma and other disorders of the eye. Is the patient up to  date with their annual eye exam?  Yes  Who is the provider or what is the name of the office in which the patient attends annual eye exams? [facility] ([place]) If pt is not established with a provider, would they like to be referred to a provider to establish care? No .   Dental Screening: Recommended annual dental exams for proper oral hygiene  Diabetic Foot Exam: Diabetic Foot Exam: Completed 01/31/2023  Community Resource Referral / Chronic Care Management: CRR required this visit?  No   CCM required this visit?  No     Plan:     I have personally reviewed and noted the following in the patient's chart:   Medical and social history Use of alcohol, tobacco or illicit drugs  Current medications and supplements including opioid prescriptions. Patient is not currently taking opioid prescriptions. Functional ability and status Nutritional status Physical activity Advanced directives List of other physicians Hospitalizations, surgeries, and ER visits in previous 12 months Vitals Screenings to include cognitive, depression, and falls Referrals and appointments  In addition, I have reviewed and discussed with patient certain preventive protocols, quality metrics, and best practice recommendations. A written personalized care plan for preventive services as well as general preventive health recommendations were provided to patient.     Tatia Petrucci L Kadance Mccuistion, CMA   07/07/2023   After Visit Summary: (Mail) Due to this being a telephonic visit, the after visit summary with patients personalized plan was offered to patient via mail   Nurse Notes: Patient explained that he fell off the bed and hit his ear against the night stand.  He had to get stiches in lt ear around 2 months ago.  He will inquire about a Handicap sticker during his next office visit with Dr. Lawerance Bach on August 01, 2023.

## 2023-07-07 NOTE — Patient Instructions (Signed)
Collin [name] , Thank you for taking time to come for your Medicare Wellness Visit. I appreciate your ongoing commitment to your health goals. Please review the following plan we discussed and let me know if I can assist you in the future.   These are the goals we discussed:  Goals       Patient Stated (pt-stated)      Patient declined health goal at this time.        This is a list of the screening recommended for you and due dates:  Health Maintenance  Topic Date Due   COVID-19 Vaccine (6 - 2023-24 season) 02/24/2023   Flu Shot  07/27/2023   Hemoglobin A1C  07/31/2023   Eye exam for diabetics  12/01/2023   Complete foot exam   02/01/2024   Medicare Annual Wellness Visit  07/06/2024   DTaP/Tdap/Td vaccine (2 - Td or Tdap) 07/18/2027   Pneumonia Vaccine  Completed   Zoster (Shingles) Vaccine  Completed   HPV Vaccine  Aged Out    Advanced directives: Please bring a copy of your health care power of attorney and living will to the office to be added to your chart at your convenience.   Conditions/risks identified: Keep up the good work.  Remember to do some back stretches to loosen up tight back muscles.  Also remember to discuss getting a Handicap sticker with Dr. Lawerance Bach at you up coming office visit with her.  Next appointment: Follow up in one year for your annual wellness visit.   Preventive Care 32 Years and Older, Male  Preventive care refers to lifestyle choices and visits with your health care provider that can promote health and wellness. What does preventive care include? A yearly physical exam. This is also called an annual well check. Dental exams once or twice a year. Routine eye exams. Ask your health care provider how often you should have your eyes checked. Personal lifestyle choices, including: Daily care of your teeth and gums. Regular physical activity. Eating a healthy diet. Avoiding tobacco and drug use. Limiting alcohol use. Practicing safe sex. Taking low  doses of aspirin every day. Taking vitamin and mineral supplements as recommended by your health care provider. What happens during an annual well check? The services and screenings done by your health care provider during your annual well check will depend on your age, overall health, lifestyle risk factors, and family history of disease. Counseling  Your health care provider may ask you questions about your: Alcohol use. Tobacco use. Drug use. Emotional well-being. Home and relationship well-being. Sexual activity. Eating habits. History of falls. Memory and ability to understand (cognition). Work and work Astronomer. Screening  You may have the following tests or measurements: Height, weight, and BMI. Blood pressure. Lipid and cholesterol levels. These may be checked every 5 years, or more frequently if you are over 5 years old. Skin check. Lung cancer screening. You may have this screening every year starting at age 28 if you have a 30-pack-year history of smoking and currently smoke or have quit within the past 15 years. Fecal occult blood test (FOBT) of the stool. You may have this test every year starting at age 74. Flexible sigmoidoscopy or colonoscopy. You may have a sigmoidoscopy every 5 years or a colonoscopy every 10 years starting at age 14. Prostate cancer screening. Recommendations will vary depending on your family history and other risks. Hepatitis C blood test. Hepatitis B blood test. Sexually transmitted disease (STD) testing. Diabetes screening.  This is done by checking your blood sugar (glucose) after you have not eaten for a while (fasting). You may have this done every 1-3 years. Abdominal aortic aneurysm (AAA) screening. You may need this if you are a current or former smoker. Osteoporosis. You may be screened starting at age 16 if you are at high risk. Talk with your health care provider about your test results, treatment options, and if necessary, the need  for more tests. Vaccines  Your health care provider may recommend certain vaccines, such as: Influenza vaccine. This is recommended every year. Tetanus, diphtheria, and acellular pertussis (Tdap, Td) vaccine. You may need a Td booster every 10 years. Zoster vaccine. You may need this after age 75. Pneumococcal 13-valent conjugate (PCV13) vaccine. One dose is recommended after age 54. Pneumococcal polysaccharide (PPSV23) vaccine. One dose is recommended after age 11. Talk to your health care provider about which screenings and vaccines you need and how often you need them. This information is not intended to replace advice given to you by your health care provider. Make sure you discuss any questions you have with your health care provider. Document Released: 01/08/2016 Document Revised: 08/31/2016 Document Reviewed: 10/13/2015 Elsevier Interactive Patient Education  2017 [employer].  Fall Prevention in the Home Falls can cause injuries. They can happen to people of all ages. There are many things you can do to make your home safe and to help prevent falls. What can I do on the outside of my home? Regularly fix the edges of walkways and driveways and fix any cracks. Remove anything that might make you trip as you walk through a door, such as a raised step or threshold. Trim any bushes or trees on the path to your home. Use bright outdoor lighting. Clear any walking paths of anything that might make someone trip, such as rocks or tools. Regularly check to see if handrails are loose or broken. Make sure that both sides of any steps have handrails. Any raised decks and porches should have guardrails on the edges. Have any leaves, snow, or ice cleared regularly. Use sand or salt on walking paths during winter. Clean up any spills in your garage right away. This includes oil or grease spills. What can I do in the bathroom? Use night lights. Install grab bars by the toilet and in the tub and  shower. Do not use towel bars as grab bars. Use non-skid mats or decals in the tub or shower. If you need to sit down in the shower, use a plastic, non-slip stool. Keep the floor dry. Clean up any water that spills on the floor as soon as it happens. Remove soap buildup in the tub or shower regularly. Attach bath mats securely with double-sided non-slip rug tape. Do not have throw rugs and other things on the floor that can make you trip. What can I do in the bedroom? Use night lights. Make sure that you have a light by your bed that is easy to reach. Do not use any sheets or blankets that are too big for your bed. They should not hang down onto the floor. Have a firm chair that has side arms. You can use this for support while you get dressed. Do not have throw rugs and other things on the floor that can make you trip. What can I do in the kitchen? Clean up any spills right away. Avoid walking on wet floors. Keep items that you use a lot in easy-to-reach places. If  you need to reach something above you, use a strong step stool that has a grab bar. Keep electrical cords out of the way. Do not use floor polish or wax that makes floors slippery. If you must use wax, use non-skid floor wax. Do not have throw rugs and other things on the floor that can make you trip. What can I do with my stairs? Do not leave any items on the stairs. Make sure that there are handrails on both sides of the stairs and use them. Fix handrails that are broken or loose. Make sure that handrails are as long as the stairways. Check any carpeting to make sure that it is firmly attached to the stairs. Fix any carpet that is loose or worn. Avoid having throw rugs at the top or bottom of the stairs. If you do have throw rugs, attach them to the floor with carpet tape. Make sure that you have a light switch at the top of the stairs and the bottom of the stairs. If you do not have them, ask someone to add them for  you. What else can I do to help prevent falls? Wear shoes that: Do not have high heels. Have rubber bottoms. Are comfortable and fit you well. Are closed at the toe. Do not wear sandals. If you use a stepladder: Make sure that it is fully opened. Do not climb a closed stepladder. Make sure that both sides of the stepladder are locked into place. Ask someone to hold it for you, if possible. Clearly mark and make sure that you can see: Any grab bars or handrails. First and last steps. Where the edge of each step is. Use tools that help you move around (mobility aids) if they are needed. These include: Canes. Walkers. Scooters. Crutches. Turn on the lights when you go into a dark area. Replace any light bulbs as soon as they burn out. Set up your furniture so you have a clear path. Avoid moving your furniture around. If any of your floors are uneven, fix them. If there are any pets around you, be aware of where they are. Review your medicines with your doctor. Some medicines can make you feel dizzy. This can increase your chance of falling. Ask your doctor what other things that you can do to help prevent falls. This information is not intended to replace advice given to you by your health care provider. Make sure you discuss any questions you have with your health care provider. Document Released: 10/08/2009 Document Revised: 05/19/2016 Document Reviewed: 01/16/2015 Elsevier Interactive Patient Education  2017 [employer].

## 2023-07-18 ENCOUNTER — Ambulatory Visit (HOSPITAL_COMMUNITY)
Admission: EM | Admit: 2023-07-18 | Discharge: 2023-07-18 | Disposition: A | Discharge status: Home / Self Care | Payer: Medicare Other

## 2023-07-18 ENCOUNTER — Encounter (HOSPITAL_COMMUNITY): Payer: Self-pay | Admitting: *Deleted

## 2023-07-18 DIAGNOSIS — T148XXA Other injury of unspecified body region, initial encounter: Secondary | ICD-10-CM | POA: Diagnosis not present

## 2023-07-18 MED ORDER — LIDOCAINE-EPINEPHRINE 1 %-1:100000 IJ SOLN
INTRAMUSCULAR | Status: AC
  Filled 2023-07-18: qty 1

## 2023-07-18 MED ORDER — MUPIROCIN 2 % EX OINT: 1.0000 | TOPICAL_OINTMENT | Freq: Every day | CUTANEOUS | 0 refills | Status: DC

## 2023-07-18 NOTE — Discharge Instructions (Signed)
We were able to drain some of the fluid/blood.  Hopefully this will help improve the healing process.  Keep this area clean with soap and water.  Apply Bactroban ointment with dressing changes twice daily.  Use warm compresses several times per day.  Please follow-up with your primary care within the week.  If anything worsens in this area becomes larger, more painful, you get a fever, nausea, vomiting you need to be seen immediately.

## 2023-07-18 NOTE — ED Triage Notes (Signed)
Pt states he has a spot on his inner right thigh x 5 days. He has been putting alcohol on it. He states its red, hard and growing.

## 2023-07-18 NOTE — ED Provider Notes (Signed)
[facility]    CSN: [id number] Arrival date & time: 07/18/23  0932      History   Chief Complaint Chief Complaint  Patient presents with   Abscess    HPI JOSHIA KITCHINGS is a 89 y.o. male.   Patient presents today with a 5-day history of enlarging lesion on his right inner thigh.  He denies any known injury or bug bite.  Reports that initially it began as the size of a dime but then has become larger prompting evaluation.  He reports that it is tender to palpation but otherwise not painful.  He has noticed that it has gotten bigger and he is concerned that is full of fluid.  He denies history of recurrent skin infections including MRSA.  He does have a history of renal cell carcinoma and bladder cancer but is not actively receiving chemotherapy.  He has been cleaning the area with alcohol that has not provided any relief of symptoms.  He denies any associated fever, nausea, vomiting.    Past Medical History:  Diagnosis Date   Allergy    Anemia    Arthritis    gout   Bladder cancer (HCC)    BPH (benign prostatic hyperplasia)    CAD (coronary artery disease) 2005   a. diaphragmatic MI s/p DES to RCA 2005, residual disease. b. [place] 11/2019 s/p DES to PDA, mRCA, mid Cx.   Cancer (HCC) 02/28/2001   Left ureter cancer/bladder   Carcinoma, renal cell (HCC)    Cardiomyopathy (HCC)    a. EF 45-50% by echo 12/2018.   Carotid artery disease (HCC)    Doppler, September, 2010, normal, no carotid artery disease   CKD (chronic kidney disease), stage III (HCC)    COPD (chronic obstructive pulmonary disease) (HCC)    Dysfunction of eustachian tube    Ejection fraction    EF 55%, echo, January, 2009   Encounter for long-term (current) use of other medications    Enlarged prostate    GERD (gastroesophageal reflux disease)    Gynecomastia    b/l   HTN (hypertension)    Hx of cystoscopy 11/02/12   negative   Hx of skin cancer, basal cell    Hypercholesterolemia    Hyperlipidemia     Low back pain syndrome    Myocardial infarction (HCC)    hx acute   Nocturia    Organic impotence    Pulmonary nodule    Chest CT, followed   Reactive airway disease    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   Staph infection 04/1997   Thrombocytopenia (HCC)     Patient Active Problem List   Diagnosis Date Noted   Acute cough 03/15/2023   COVID 03/15/2023   Calf muscle weakness 01/30/2023   Acute sinus infection 10/18/2022   Unstable angina (HCC)    Shortness of breath 11/01/2019   Thrombocytopenia (HCC)    Skin cancer    Pulmonary nodule    Nocturia    Myocardial infarction (HCC)    Low back pain syndrome    COPD (chronic obstructive pulmonary disease) (HCC)    BPH (benign prostatic hyperplasia)    Bladder cancer (HCC)    Arthritis    CKD (chronic kidney disease), stage III 07/10/2017   Diabetes (HCC) 04/15/2017   Fatty liver 06/18/2016   Rash and nonspecific skin eruption 06/13/2016   H/O bacterial endocarditis 04/06/2016   Myalgia and myositis 12/18/2015   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   Gynecomastia    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   Dermatochalasis 07/04/2012   Bilateral pseudophakia 07/04/2012   Spinal stenosis, lumbar 05/12/2012   Keratitis 05/01/2012   Epiretinal membrane 05/01/2012   Degenerative drusen 05/01/2012   CAD (coronary artery disease)    Carotid artery disease (HCC)    Fuchs' corneal dystrophy 01/04/2012   CERVICAL RADICULOPATHY, LEFT 12/07/2009   SKIN CANCER, HX OF 12/07/2009   Renal cell carcinoma (HCC) 07/18/2009   ANEMIA, 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   GERD 07/18/2009   Essential hypertension 04/23/2008   Dyslipidemia 01/25/2007   History of cancer, left ureteral 02/28/2001    Past Surgical History:  Procedure Laterality Date   BACK SURGERY  02/1996   bladder tumors  2003, 2004, 01/2009   removed   CARDIAC CATHETERIZATION     CHOLECYSTECTOMY  07/2003    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   sutured, right   CORNEAL TRANSPLANT  10/2008   left   CORONARY STENT INTERVENTION  11/28/2019   CORONARY STENT INTERVENTION N/A 11/28/2019   Procedure: CORONARY STENT INTERVENTION;  Surgeon: Kathleene Hazel, MD;  Location: [facility];  Service: Cardiovascular;  Laterality: N/A;   LEFT HEART CATH AND CORONARY ANGIOGRAPHY N/A 11/27/2019   Procedure: LEFT HEART CATH AND CORONARY ANGIOGRAPHY;  Surgeon: Kathleene Hazel, MD;  Location: [facility];  Service: Cardiovascular;  Laterality: N/A;   NEPHRECTOMY  02/2001   left kidney, ureter   osteomyelitis     staph aureus   URETERECTOMY  2003   for cancer       Home Medications    Prior to Admission medications   Medication Sig Start Date End Date Taking? Authorizing Provider  alfuzosin (UROXATRAL) 10 MG 24 hr tablet Take 10 mg by mouth daily.  01/19/12  Yes [provider]  aspirin EC 81 MG tablet Take 1 tablet (81 mg total) by mouth daily. 11/20/19  Yes Nahser, Deloris Ping, MD  carvedilol (COREG) 3.125 MG tablet TAKE 1 TABLET TWICE A DAY 04/05/23  Yes Nahser, Deloris Ping, MD  clopidogrel (PLAVIX) 75 MG tablet TAKE 1 TABLET DAILY 02/13/23  Yes Nahser, Deloris Ping, MD  fenofibrate (TRICOR) 48 MG tablet TAKE 1 TABLET DAILY 02/27/23  Yes Nahser, Deloris Ping, MD  finasteride (PROSCAR) 5 MG tablet Take 5 mg by mouth daily.   Yes [provider]  fluocinonide ointment (LIDEX) 0.05 % SMARTSIG:1 Sparingly Topical Twice Daily 07/09/21  Yes [provider]  losartan (COZAAR) 50 MG tablet TAKE 1/2 TABLET IN THE     MORNING AND AT BEDTIME 02/13/23  Yes Nahser, Deloris Ping, MD  metroNIDAZOLE (METROGEL) 0.75 % gel Apply topically 2 (two) times daily. 08/12/20  Yes [provider]  Multiple Vitamin (MULTIVITAMIN) tablet Take 1 tablet by mouth daily.   Yes [provider]  mupirocin ointment (BACTROBAN) 2 % Apply 1 Application topically daily. 07/18/23  Yes Maurianna Benard K, PA-C   prednisoLONE acetate (PRED FORTE) 1 % ophthalmic suspension SMARTSIG:1 Drop(s) Left Eye Every 2 Hours   Yes [provider]  rosuvastatin (CRESTOR) 5 MG tablet TAKE 1 TABLET AT BEDTIME 04/05/23  Yes Nahser, Deloris Ping, MD  sodium chloride (OCEAN) 0.65 % SOLN nasal spray Place 1 spray into both nostrils as needed for congestion.   Yes [provider]  triamcinolone ointment (KENALOG) 0.1 % Apply 1 application topically daily.    Yes [provider]  acetaminophen (TYLENOL) 500 MG  tablet Take 500 mg by mouth every 6 (six) hours as needed for moderate pain or headache.     [provider]  nitroGLYCERIN (NITROSTAT) 0.4 MG SL tablet Place 1 tablet (0.4 mg total) under the tongue every 5 (five) minutes as needed for chest pain. 01/25/21   Nahser, Deloris Ping, MD    Family History Family History  Problem Relation Age of Onset   Heart failure Mother 69   Diabetes Mother    Coronary artery disease Brother    Diabetes Brother    Drug abuse Brother    Diabetes Sister    Diabetes Sister     Social History Social History   Tobacco Use   Smoking status: Former    Types: Pipe    Quit date: 12/27/1991    Years since quitting: 31.5   Smokeless tobacco: Former   Tobacco comments:    Quit 20-30 years ago as of 2012  Vaping Use   Vaping status: Never Used  Substance Use Topics   Alcohol use: No    Alcohol/week: 0.0 standard drinks of alcohol   Drug use: No     Allergies   Crestor [rosuvastatin], Atorvastatin, Peanut oil, and Trandolapril   Review of Systems Review of Systems  Constitutional:  Negative for activity change, appetite change, fatigue and fever.  Respiratory:  Negative for cough and shortness of breath.   Cardiovascular:  Negative for chest pain.  Gastrointestinal:  Negative for abdominal pain, diarrhea, nausea and vomiting.  Musculoskeletal:  Negative for arthralgias and myalgias.  Skin:  Positive for color change. Negative for wound.   Neurological:  Negative for dizziness, light-headedness and headaches.     Physical Exam Triage Vital Signs ED Triage Vitals  Encounter Vitals Group     BP 07/18/23 0957 (!) 169/89     Systolic BP Percentile --      Diastolic BP Percentile --      Pulse Rate 07/18/23 0957 60     Resp 07/18/23 0957 18     Temp 07/18/23 0957 97.8 F (36.6 C)     Temp Source 07/18/23 0957 Oral     SpO2 07/18/23 0957 95 %     Weight --      Height --      Head Circumference --      Peak Flow --      Pain Score 07/18/23 0954 4     Pain Loc --      Pain Education --      Exclude from Growth Chart --    No data found.  Updated Vital Signs BP (!) 169/89 (BP Location: Left Arm)   Pulse 60   Temp 97.8 F (36.6 C) (Oral)   Resp 18   SpO2 95%   Visual Acuity Right Eye Distance:   Left Eye Distance:   Bilateral Distance:    Right Eye Near:   Left Eye Near:    Bilateral Near:     Physical Exam Vitals reviewed.  Constitutional:      General: He is awake.     Appearance: Normal appearance. He is well-developed. He is not ill-appearing.     Comments: Very pleasant male appears stated age in no acute distress sitting comfortably in exam room  HENT:     Head: Normocephalic and atraumatic.     Mouth/Throat:     Pharynx: No oropharyngeal exudate, posterior oropharyngeal erythema or uvula swelling.  Pulmonary:     Effort: Pulmonary effort is normal. No  accessory muscle usage or respiratory distress.  Skin:         Comments: 4 cm x 2 cm erythematous lesion with surrounding ecchymosis with central fluctuance.  No streaking or evidence of lymphangitis.  No bleeding or drainage noted.  Neurological:     Mental Status: He is alert.  Psychiatric:        Behavior: Behavior is cooperative.      UC Treatments / Results  Labs (all labs ordered are listed, but only abnormal results are displayed) Labs Reviewed - No data to display  EKG   Radiology No results  found.  Procedures Incision and Drainage  Date/Time: 07/18/2023 10:29 AM  Performed by: Jeani Hawking, PA-C Authorized by: Jeani Hawking, PA-C   Consent:    Consent obtained:  Verbal   Risks discussed:  Bleeding, incomplete drainage, infection and pain   Alternatives discussed:  Referral and observation Universal protocol:    Procedure explained and questions answered to patient or proxy's satisfaction: yes     Patient identity confirmed:  Verbally with patient Location:    Type:  Seroma   Size:  4 cm x 2 cm   Location:  Lower extremity   Lower extremity location:  Leg   Leg location:  R upper leg Pre-procedure details:    Skin preparation:  Chlorhexidine with alcohol Sedation:    Sedation type:  None Anesthesia:    Anesthesia method:  Local infiltration   Local anesthetic:  Lidocaine 1% WITH epi Procedure type:    Complexity:  Simple Procedure details:    Ultrasound guidance: no     Needle aspiration: no     Incision types:  Stab incision   Incision depth:  Dermal   Wound management:  Irrigated with saline   Drainage:  Bloody and serosanguinous   Drainage amount:  Moderate   Wound treatment:  Wound left open   Packing materials:  None Post-procedure details:    Procedure completion:  Tolerated  (including critical care time)  Medications Ordered in UC Medications - No data to display  Initial Impression / Assessment and Plan / UC Course  I have reviewed the triage vital signs and the nursing notes.  Pertinent labs & imaging results that were available during my care of the patient were reviewed by me and considered in my medical decision making (see chart for details).     Given overlying erythema, enlargement, fluctuance I&D was attempted in clinic.  Moderate serosanguineous fluid was released with improvement of symptoms.  No evidence of purulence so no systemic antibiotics were deferred.  He was encouraged to keep area clean and use warm compresses multiple  times a day.  He was prescribed Bactroban to be used on wound.  He is to monitor the area of erythema and ecchymosis and if this continues to enlarge he is to return for reevaluation.  Recommend close follow-up with his primary care and he will schedule an appoint with them later this week.  We discussed that if anything worsens and he has spread of redness, increasing pain, abnormal drainage, fever, nausea, vomiting he needs to be seen immediately to which she expressed understanding.  Strict return precautions given.  All questions answered to patient satisfaction.  He tolerated procedure well.  Final Clinical Impressions(s) / UC Diagnoses   Final diagnoses:  Hematoma     Discharge Instructions      We were able to drain some of the fluid/blood.  Hopefully this will help improve the  healing process.  Keep this area clean with soap and water.  Apply Bactroban ointment with dressing changes twice daily.  Use warm compresses several times per day.  Please follow-up with your primary care within the week.  If anything worsens in this area becomes larger, more painful, you get a fever, nausea, vomiting you need to be seen immediately.     ED Prescriptions     Medication Sig Dispense Auth. Provider   mupirocin ointment (BACTROBAN) 2 % Apply 1 Application topically daily. 22 g Allicia Culley K, PA-C      PDMP not reviewed this encounter.   Jeani Hawking, PA-C 07/18/23 1034

## 2023-07-31 NOTE — Patient Instructions (Addendum)
      Blood work was ordered.   The lab is on the first floor.    Medications changes include :       A referral was ordered and someone will call you to schedule an appointment.     Return in about 6 months (around 02/01/2024) for follow up.

## 2023-07-31 NOTE — Progress Notes (Unsigned)
Subjective:    Patient ID: Collin [name], male    DOB: [birth year], 89 y.o.   MRN: [id number]     HPI Kirtus is here for follow up of his chronic medical problems.  Hematoma Right inner leg    Medications and allergies reviewed with patient and updated if appropriate.  Current Outpatient Medications on File Prior to Visit  Medication Sig Dispense Refill   acetaminophen (TYLENOL) 500 MG tablet Take 500 mg by mouth every 6 (six) hours as needed for moderate pain or headache.      alfuzosin (UROXATRAL) 10 MG 24 hr tablet Take 10 mg by mouth daily.      aspirin EC 81 MG tablet Take 1 tablet (81 mg total) by mouth daily.     carvedilol (COREG) 3.125 MG tablet TAKE 1 TABLET TWICE A DAY 180 tablet 3   clopidogrel (PLAVIX) 75 MG tablet TAKE 1 TABLET DAILY 90 tablet 3   fenofibrate (TRICOR) 48 MG tablet TAKE 1 TABLET DAILY 90 tablet 3   finasteride (PROSCAR) 5 MG tablet Take 5 mg by mouth daily.     fluocinonide ointment (LIDEX) 0.05 % SMARTSIG:1 Sparingly Topical Twice Daily     losartan (COZAAR) 50 MG tablet TAKE 1/2 TABLET IN THE     MORNING AND AT BEDTIME 90 tablet 3   metroNIDAZOLE (METROGEL) 0.75 % gel Apply topically 2 (two) times daily.     Multiple Vitamin (MULTIVITAMIN) tablet Take 1 tablet by mouth daily.     mupirocin ointment (BACTROBAN) 2 % Apply 1 Application topically daily. 22 g 0   nitroGLYCERIN (NITROSTAT) 0.4 MG SL tablet Place 1 tablet (0.4 mg total) under the tongue every 5 (five) minutes as needed for chest pain. 25 tablet 6   prednisoLONE acetate (PRED FORTE) 1 % ophthalmic suspension SMARTSIG:1 Drop(s) Left Eye Every 2 Hours     rosuvastatin (CRESTOR) 5 MG tablet TAKE 1 TABLET AT BEDTIME 90 tablet 3   sodium chloride (OCEAN) 0.65 % SOLN nasal spray Place 1 spray into both nostrils as needed for congestion.     triamcinolone ointment (KENALOG) 0.1 % Apply 1 application topically daily.      No current facility-administered medications on file prior to visit.      Review of Systems     Objective:  There were no vitals filed for this visit. BP Readings from Last 3 Encounters:  07/18/23 (!) 169/89  07/07/23 130/68  03/15/23 122/64   Wt Readings from Last 3 Encounters:  07/07/23 170 lb 12.8 oz (77.5 kg)  03/15/23 164 lb (74.4 kg)  02/13/23 167 lb 9.6 oz (76 kg)   There is no height or weight on file to calculate BMI.    Physical Exam     Lab Results  Component Value Date   WBC 4.7 01/30/2023   HGB 13.6 01/30/2023   HCT 39.8 01/30/2023   PLT 118.0 (L) 01/30/2023   GLUCOSE 106 (H) 01/30/2023   CHOL 117 01/30/2023   TRIG 216.0 (H) 01/30/2023   HDL 41.30 01/30/2023   LDLDIRECT 49.0 01/30/2023   LDLCALC 53 01/18/2021   ALT 18 01/30/2023   AST 19 01/30/2023   NA 138 01/30/2023   K 5.1 01/30/2023   CL 104 01/30/2023   CREATININE 1.78 (H) 01/30/2023   BUN 25 (H) 01/30/2023   CO2 28 01/30/2023   TSH 3.40 07/08/2019   INR 1.04 11/04/2009   HGBA1C 6.4 01/30/2023   MICROALBUR <0.7 01/08/2018  Assessment & Plan:    See Problem List for Assessment and Plan of chronic medical problems.

## 2023-08-01 ENCOUNTER — Encounter: Payer: Self-pay | Admitting: Internal Medicine

## 2023-08-01 ENCOUNTER — Ambulatory Visit (INDEPENDENT_AMBULATORY_CARE_PROVIDER_SITE_OTHER): Payer: Medicare Other | Admitting: Internal Medicine

## 2023-08-01 VITALS — BP 116/72 | HR 60 | Temp 98.0°F | Ht 67.5 in | Wt 170.0 lb

## 2023-08-01 DIAGNOSIS — I10 Essential (primary) hypertension: Secondary | ICD-10-CM

## 2023-08-01 DIAGNOSIS — H9193 Unspecified hearing loss, bilateral: Secondary | ICD-10-CM

## 2023-08-01 DIAGNOSIS — N1832 Chronic kidney disease, stage 3b: Secondary | ICD-10-CM | POA: Diagnosis not present

## 2023-08-01 DIAGNOSIS — N1831 Chronic kidney disease, stage 3a: Secondary | ICD-10-CM

## 2023-08-01 DIAGNOSIS — E1122 Type 2 diabetes mellitus with diabetic chronic kidney disease: Secondary | ICD-10-CM

## 2023-08-01 DIAGNOSIS — I2510 Atherosclerotic heart disease of native coronary artery without angina pectoris: Secondary | ICD-10-CM | POA: Diagnosis not present

## 2023-08-01 DIAGNOSIS — E785 Hyperlipidemia, unspecified: Secondary | ICD-10-CM | POA: Diagnosis not present

## 2023-08-01 DIAGNOSIS — D696 Thrombocytopenia, unspecified: Secondary | ICD-10-CM

## 2023-08-01 DIAGNOSIS — D72819 Decreased white blood cell count, unspecified: Secondary | ICD-10-CM

## 2023-08-01 LAB — COMPREHENSIVE METABOLIC PANEL
ALT: 15 U/L (ref 0–53)
AST: 17 U/L (ref 0–37)
Albumin: 4.4 g/dL (ref 3.5–5.2)
Alkaline Phosphatase: 50 U/L (ref 39–117)
BUN: 28 mg/dL — ABNORMAL HIGH (ref 6–23)
CO2: 27 mEq/L (ref 19–32)
Calcium: 9.8 mg/dL (ref 8.4–10.5)
Chloride: 105 mEq/L (ref 96–112)
Creatinine, Ser: 1.82 mg/dL — ABNORMAL HIGH (ref 0.40–1.50)
GFR: 31.05 mL/min — ABNORMAL LOW (ref >60.00)
Glucose, Bld: 111 mg/dL — ABNORMAL HIGH (ref 70–99)
Potassium: 4.7 mEq/L (ref 3.5–5.1)
Sodium: 140 mEq/L (ref 135–145)
Total Bilirubin: 0.9 mg/dL (ref 0.2–1.2)
Total Protein: 6.5 g/dL (ref 6.0–8.3)

## 2023-08-01 LAB — CBC WITH DIFFERENTIAL/PLATELET
Basophils Absolute: 0 10*3/uL (ref 0.0–0.1)
Basophils Relative: 0.9 % (ref 0.0–3.0)
Eosinophils Absolute: 0.1 10*3/uL (ref 0.0–0.7)
Eosinophils Relative: 4.2 % (ref 0.0–5.0)
HCT: 39.2 % (ref 39.0–52.0)
Hemoglobin: 13.1 g/dL (ref 13.0–17.0)
Lymphocytes Relative: 33.6 % (ref 12.0–46.0)
Lymphs Abs: 1.1 10*3/uL (ref 0.7–4.0)
MCHC: 33.4 g/dL (ref 30.0–36.0)
MCV: 95.3 fl (ref 78.0–100.0)
Monocytes Absolute: 0.4 10*3/uL (ref 0.1–1.0)
Monocytes Relative: 14.1 % — ABNORMAL HIGH (ref 3.0–12.0)
Neutro Abs: 1.5 10*3/uL (ref 1.4–7.7)
Neutrophils Relative %: 47.2 % (ref 43.0–77.0)
Platelets: 98 10*3/uL — ABNORMAL LOW (ref 150.0–400.0)
RBC: 4.11 Mil/uL — ABNORMAL LOW (ref 4.22–5.81)
RDW: 13.8 % (ref 11.5–15.5)
WBC: 3.1 10*3/uL — ABNORMAL LOW (ref 4.0–10.5)

## 2023-08-01 LAB — HEMOGLOBIN A1C: Hgb A1c MFr Bld: 6.5 % (ref 4.6–6.5)

## 2023-08-01 LAB — LDL CHOLESTEROL, DIRECT: Direct LDL: 58 mg/dL

## 2023-08-01 LAB — LIPID PANEL
Cholesterol: 126 mg/dL (ref 0–200)
HDL: 42 mg/dL (ref >39.00)
NonHDL: 84.03
Total CHOL/HDL Ratio: 3
Triglycerides: 211 mg/dL — ABNORMAL HIGH (ref 0.0–149.0)
VLDL: 42.2 mg/dL — ABNORMAL HIGH (ref 0.0–40.0)

## 2023-08-01 NOTE — Assessment & Plan Note (Signed)
Chronic Follows with cardiology Continue Coreg 3.125 mg twice daily, losartan 25 mg twice daily, Plavix 75 mg daily, fenofibrate 48 mg daily, Crestor 5 mg daily

## 2023-08-01 NOTE — Assessment & Plan Note (Signed)
Chronic Stable CMP, CBC 

## 2023-08-01 NOTE — Assessment & Plan Note (Signed)
Chronic  Lab Results  Component Value Date   HGBA1C 6.4 01/30/2023   Sugars controlled Check A1c Continue lifestyle control

## 2023-08-01 NOTE — Assessment & Plan Note (Signed)
Chronic Blood pressure well controlled CMP, CBC Continue Coreg 3.125 mg twice daily, losartan 25 mg twice daily

## 2023-08-01 NOTE — Assessment & Plan Note (Signed)
Chronic Check lipid panel  Continue Crestor 5 mg daily, fenofibrate 48 mg daily

## 2023-08-02 NOTE — Addendum Note (Signed)
Addended by: Pincus Sanes on: 08/02/2023 01:12 PM   Modules accepted: Orders

## 2023-08-13 NOTE — Progress Notes (Addendum)
Office Visit    Patient Name: Collin [name] Date of Encounter: 08/13/2023  Primary Care Provider:  Pincus Sanes, MD Primary Cardiologist:  Kristeen Miss, MD Primary Electrophysiologist: None   Past Medical History    Past Medical History:  Diagnosis Date   Allergy    Anemia    Arthritis    gout   Bladder cancer (HCC)    BPH (benign prostatic hyperplasia)    CAD (coronary artery disease) 2005   a. diaphragmatic MI s/p DES to RCA 2005, residual disease. b. [place] 11/2019 s/p DES to PDA, mRCA, mid Cx.   Cancer (HCC) 02/28/2001   Left ureter cancer/bladder   Carcinoma, renal cell (HCC)    Cardiomyopathy (HCC)    a. EF 45-50% by echo 12/2018.   Carotid artery disease (HCC)    Doppler, September, 2010, normal, no carotid artery disease   CKD (chronic kidney disease), stage III (HCC)    COPD (chronic obstructive pulmonary disease) (HCC)    Dysfunction of eustachian tube    Ejection fraction    EF 55%, echo, January, 2009   Encounter for long-term (current) use of other medications    Enlarged prostate    GERD (gastroesophageal reflux disease)    Gynecomastia    b/l   HTN (hypertension)    Hx of cystoscopy 11/02/12   negative   Hx of skin cancer, basal cell    Hypercholesterolemia    Hyperlipidemia    Low back pain syndrome    Myocardial infarction (HCC)    hx acute   Nocturia    Organic impotence    Pulmonary nodule    Chest CT, followed   Reactive airway disease    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   Staph infection 04/1997   Thrombocytopenia [facility])    Past Surgical History:  Procedure Laterality Date   BACK SURGERY  02/1996   bladder tumors  2003, 2004, 01/2009   removed   CARDIAC CATHETERIZATION     CHOLECYSTECTOMY  07/2003   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   sutured, right   CORNEAL TRANSPLANT  10/2008   left   CORONARY STENT INTERVENTION  11/28/2019   CORONARY STENT INTERVENTION N/A 11/28/2019   Procedure: CORONARY STENT INTERVENTION;   Surgeon: Kathleene Hazel, MD;  Location: [facility] INVASIVE CV LAB;  Service: Cardiovascular;  Laterality: N/A;   LEFT HEART CATH AND CORONARY ANGIOGRAPHY N/A 11/27/2019   Procedure: LEFT HEART CATH AND CORONARY ANGIOGRAPHY;  Surgeon: Kathleene Hazel, MD;  Location: [facility] INVASIVE CV LAB;  Service: Cardiovascular;  Laterality: N/A;   NEPHRECTOMY  02/2001   left kidney, ureter   osteomyelitis     staph aureus   URETERECTOMY  2003   for cancer    Allergies  Allergies  Allergen Reactions   Crestor [Rosuvastatin]     Muscle pain   Atorvastatin     Muscle aches    Peanut Oil Rash    sneezing   Trandolapril Cough     History of Present Illness    Collin [name]  is a 89year old male with a PMH of CAD s/p MI 2005 with PCI/DES to RCA, HLD, HTN, ICM, DM type II, CKD stage III, COPD thrombocytopenia, renal CA s/p left nephrectomy who presents today for 1-month follow-up.  Collin [name] was seen initially by Dr. Myrtis Ser and is currently followed by Dr. Elease Hashimoto for management of CAD.  He underwent 2D echo last on 12/2018 that  showed mildly reduced EF of 45-50% with hypokinesis of inferior lateral myocardium and grade 1 DD, mild MR.  He was seen by Dr. Elease Hashimoto on 10/2019 and had complaint of chest pain.  He underwent LHC that showed three-vessel CAD and underwent staged PCI with stents placed to, mid RCA and mid circumflex.  He was last seen by Dr. Elease Hashimoto on 01/2023 and reported some generalized weakness in his legs which improved with stopping his statin.  This was resumed at a lower dose and to continue and recheck LFTs and lipids in 6 months.  Since last being seen in the office patient reports that he is doing well with no new cardiac complaints since previous visit.  He is tolerating his current medications and denies any adverse reactions.  Blood pressure is controlled at 98/54 and heart rate was 67 bpm.  Today.  Patient denies chest pain, palpitations, dyspnea, PND, orthopnea, nausea, vomiting,  dizziness, syncope, edema, weight gain, or early satiety.   Home Medications    Current Outpatient Medications  Medication Sig Dispense Refill   acetaminophen (TYLENOL) 500 MG tablet Take 500 mg by mouth every 6 (six) hours as needed for moderate pain or headache.      alfuzosin (UROXATRAL) 10 MG 24 hr tablet Take 10 mg by mouth daily.      aspirin EC 81 MG tablet Take 1 tablet (81 mg total) by mouth daily.     carvedilol (COREG) 3.125 MG tablet TAKE 1 TABLET TWICE A DAY 180 tablet 3   clopidogrel (PLAVIX) 75 MG tablet TAKE 1 TABLET DAILY 90 tablet 3   fenofibrate (TRICOR) 48 MG tablet TAKE 1 TABLET DAILY 90 tablet 3   finasteride (PROSCAR) 5 MG tablet Take 5 mg by mouth daily.     fluocinonide ointment (LIDEX) 0.05 % SMARTSIG:1 Sparingly Topical Twice Daily     losartan (COZAAR) 50 MG tablet TAKE 1/2 TABLET IN THE     MORNING AND AT BEDTIME 90 tablet 3   metroNIDAZOLE (METROGEL) 0.75 % gel Apply topically 2 (two) times daily.     Multiple Vitamin (MULTIVITAMIN) tablet Take 1 tablet by mouth daily.     mupirocin ointment (BACTROBAN) 2 % Apply 1 Application topically daily. 22 g 0   nitroGLYCERIN (NITROSTAT) 0.4 MG SL tablet Place 1 tablet (0.4 mg total) under the tongue every 5 (five) minutes as needed for chest pain. 25 tablet 6   prednisoLONE acetate (PRED FORTE) 1 % ophthalmic suspension SMARTSIG:1 Drop(s) Left Eye Every 2 Hours     rosuvastatin (CRESTOR) 5 MG tablet TAKE 1 TABLET AT BEDTIME 90 tablet 3   sodium chloride (OCEAN) 0.65 % SOLN nasal spray Place 1 spray into both nostrils as needed for congestion.     triamcinolone ointment (KENALOG) 0.1 % Apply 1 application topically daily.      No current facility-administered medications for this visit.     Review of Systems  Please see the history of present illness.    (All other systems reviewed and are otherwise negative except as noted above.  Physical Exam    Wt Readings from Last 3 Encounters:  08/01/23 170 lb (77.1 kg)   07/07/23 170 lb 12.8 oz (77.5 kg)  03/15/23 164 lb (74.4 kg)   ZO:XWRUE were no vitals filed for this visit.,There is no height or weight on file to calculate BMI.  Constitutional:      Appearance: Healthy appearance. Not in distress.  Neck:     Vascular: JVD normal.  Pulmonary:  Effort: Pulmonary effort is normal.     Breath sounds: No wheezing. No rales. Diminished in the bases Cardiovascular:     Normal rate. Regular rhythm. Normal S1. Normal S2.      Murmurs: There is no murmur.  Edema:    Peripheral edema absent.  Abdominal:     Palpations: Abdomen is soft non tender. There is no hepatomegaly.  Skin:    General: Skin is warm and dry.  Neurological:     General: No focal deficit present.     Mental Status: Alert and oriented to person, place and time.     Cranial Nerves: Cranial nerves are intact.  EKG/LABS/ Recent Cardiac Studies    ECG personally reviewed by me today -none completed today  Lab Results  Component Value Date   WBC 3.1 (L) 08/01/2023   HGB 13.1 08/01/2023   HCT 39.2 08/01/2023   MCV 95.3 08/01/2023   PLT 98.0 (L) 08/01/2023   Lab Results  Component Value Date   CREATININE 1.82 (H) 08/01/2023   BUN 28 (H) 08/01/2023   NA 140 08/01/2023   K 4.7 08/01/2023   CL 105 08/01/2023   CO2 27 08/01/2023   Lab Results  Component Value Date   ALT 15 08/01/2023   AST 17 08/01/2023   ALKPHOS 50 08/01/2023   BILITOT 0.9 08/01/2023   Lab Results  Component Value Date   CHOL 126 08/01/2023   HDL 42.00 08/01/2023   LDLCALC 53 01/18/2021   LDLDIRECT 58.0 08/01/2023   TRIG 211.0 (H) 08/01/2023   CHOLHDL 3 08/01/2023    Lab Results  Component Value Date   HGBA1C 6.5 08/01/2023     Assessment & Plan    1.  Coronary artery disease: -s/p LHC and staged PCI on 11/28/2019 and today reports no chest pain or anginal equivalent since previous visit. -Continue GDMT with carvedilol 3.125 mg twice daily, Plavix 75 mg, aspirin 81 mg, Crestor 5 mg  2.   Essential hypertension: -Patient's blood pressure today was controlled at 98/54 -Continue carvedilol 3.125 mg twice daily and losartan 50 mg daily  3.  Hyperlipidemia: -Patient's LDL cholesterol was recently rechecked today. -Continue Crestor 5 mg  4.  CKD: -Patient's last creatinine was 1.8 -We will continue to monitor  5.  DM type II: -Patient's hemoglobin A1c was 6.5 -Continue per PCP  6.  Preoperative clearance: -Patient's RCRI score is 0.9% -During patient's visit he was able to complete 4 METS of activity without any difficulty and had no acute problems or changes at that time. -He would be at acceptable risk to proceed with scheduled surgical procedure with no further cardiovascular testing warranted at this time.  Disposition: Follow-up with Kristeen Miss, MD or APP in 6 months    Medication Adjustments/Labs and Tests Ordered: Current medicines are reviewed at length with the patient today.  Concerns regarding medicines are outlined above.   Signed, Napoleon Form, Leodis Rains, NP 08/13/2023, 5:19 PM [facility]

## 2023-08-14 ENCOUNTER — Ambulatory Visit (INDEPENDENT_AMBULATORY_CARE_PROVIDER_SITE_OTHER): Payer: Medicare Other | Admitting: Nurse Practitioner

## 2023-08-14 ENCOUNTER — Encounter: Payer: Self-pay | Admitting: Nurse Practitioner

## 2023-08-14 ENCOUNTER — Ambulatory Visit: Payer: Medicare Other | Attending: Cardiovascular Disease

## 2023-08-14 VITALS — BP 98/54 | HR 67 | Ht 67.5 in | Wt 169.8 lb

## 2023-08-14 DIAGNOSIS — I2510 Atherosclerotic heart disease of native coronary artery without angina pectoris: Secondary | ICD-10-CM

## 2023-08-14 DIAGNOSIS — N1832 Chronic kidney disease, stage 3b: Secondary | ICD-10-CM

## 2023-08-14 DIAGNOSIS — I10 Essential (primary) hypertension: Secondary | ICD-10-CM | POA: Diagnosis not present

## 2023-08-14 DIAGNOSIS — J439 Emphysema, unspecified: Secondary | ICD-10-CM

## 2023-08-14 DIAGNOSIS — E1122 Type 2 diabetes mellitus with diabetic chronic kidney disease: Secondary | ICD-10-CM

## 2023-08-14 DIAGNOSIS — N1831 Chronic kidney disease, stage 3a: Secondary | ICD-10-CM

## 2023-08-14 DIAGNOSIS — E782 Mixed hyperlipidemia: Secondary | ICD-10-CM

## 2023-08-14 DIAGNOSIS — M7121 Synovial cyst of popliteal space [Baker], right knee: Secondary | ICD-10-CM

## 2023-08-14 NOTE — Patient Instructions (Signed)
Medication Instructions:  Your physician recommends that you continue on your current medications as directed. Please refer to the Current Medication list given to you today. *If you need a refill on your cardiac medications before your next appointment, please call your pharmacy*   Lab Work: None ordered If you have labs (blood work) drawn today and your tests are completely normal, you will receive your results only by: MyChart Message (if you have MyChart) OR A paper copy in the mail If you have any lab test that is abnormal or we need to change your treatment, we will call you to review the results.   Testing/Procedures: None ordered   Follow-Up: At [facility], you and your health needs are our priority.  As part of our continuing mission to provide you with exceptional heart care, we have created designated Provider Care Teams.  These Care Teams include your primary Cardiologist (physician) and Advanced Practice Providers (APPs -  Physician Assistants and Nurse Practitioners) who all work together to provide you with the care you need, when you need it.  We recommend signing up for the patient portal called "MyChart".  Sign up information is provided on this After Visit Summary.  MyChart is used to connect with patients for Virtual Visits (Telemedicine).  Patients are able to view lab/test results, encounter notes, upcoming appointments, etc.  Non-urgent messages can be sent to your provider as well.   To learn more about what you can do with MyChart, go to [email or web address].    Your next appointment:   6 month(s)  Provider:   Philip Nahser, MD    Other Instructions   

## 2023-08-15 LAB — LIPID PANEL
Chol/HDL Ratio: 2.6 ratio (ref 0.0–5.0)
Cholesterol, Total: 102 mg/dL (ref 100–199)
HDL: 39 mg/dL — ABNORMAL LOW (ref >39)
LDL Chol Calc (NIH): 32 mg/dL (ref 0–99)
Triglycerides: 190 mg/dL — ABNORMAL HIGH (ref 0–149)
VLDL Cholesterol Cal: 31 mg/dL (ref 5–40)

## 2023-08-15 LAB — ALT: ALT: 13 IU/L (ref 0–44)

## 2023-08-17 ENCOUNTER — Ambulatory Visit: Payer: Medicare Other | Attending: Internal Medicine | Admitting: Audiology

## 2023-08-17 DIAGNOSIS — H903 Sensorineural hearing loss, bilateral: Secondary | ICD-10-CM

## 2023-08-17 NOTE — Procedures (Signed)
  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  Collin [name] reports he has increased trouble hearing his television.  He also reports having hearing difficulty when friends and family members are talking softly and difficulty hearing in the presence of background noise.  Collin [name] reports left aural fullness and reports he feels his tube in his ear is clogged.  He denies otalgia, tinnitus, dizziness, high and a history of noise exposure  Evaluation:  Otoscopy showed a clear view of the tympanic membranes, bilaterally Tympanometry results were consistent with normal middle ear pressure and normal tympanic membrane mobility in both ears. Audiometric testing was completed using Conventional Audiometry techniques with insert earphones and TDH headphones. Test results are consistent in the right ear with a moderate to severe sensorineural hearing loss and consistent in the left ear with a mild to severe sensorineural hearing loss.  Asymmetry noted at 250 to 500 Hz worse in the right ear. Speech Recognition Thresholds were obtained at 50 dB HL in the right ear and at 40  dB HL in the left ear. Word Recognition Testing was completed at 80 dB HL and [place] scored 88% in the left ear and 60% in the right ear.  Asymmetric word recognition noted which is worse in the right ear.   Results:  The test results were reviewed with Collin [name]. Today's  results are consistent in the right ear with a moderate to severe sensorineural hearing loss and consistent in the left ear with a mild to  severe sensorineural hearing loss.  Asymmetry noted at 250 to 500 Hz worse in the right ear.  Collin [name] will have hearing and communication difficulties in all listening environments.  He will benefit from the use of amplification and the use of good communication strategies.  Collin [name] was given a copy of his audiogram today.  He was also given a list of hearing aid providers in the [place] area.  Recommendations: 1.   Communication needs assessment with an audiologist to further discuss hearing aids.   30 minutes spent testing and counseling on results.   If you have any questions please feel free to contact me at [phone].  Marton Redwood Audiologist, Au.D., CCC-A 08/17/2023  2:22 PM  Cc: Pincus Sanes, MD

## 2023-09-04 ENCOUNTER — Ambulatory Visit (INDEPENDENT_AMBULATORY_CARE_PROVIDER_SITE_OTHER): Payer: Medicare Other | Admitting: Podiatry

## 2023-09-04 ENCOUNTER — Encounter: Payer: Self-pay | Admitting: Podiatry

## 2023-09-04 DIAGNOSIS — B351 Tinea unguium: Secondary | ICD-10-CM | POA: Diagnosis not present

## 2023-09-04 DIAGNOSIS — D689 Coagulation defect, unspecified: Secondary | ICD-10-CM | POA: Diagnosis not present

## 2023-09-04 DIAGNOSIS — M79675 Pain in left toe(s): Secondary | ICD-10-CM

## 2023-09-04 DIAGNOSIS — E119 Type 2 diabetes mellitus without complications: Secondary | ICD-10-CM

## 2023-09-04 DIAGNOSIS — M79674 Pain in right toe(s): Secondary | ICD-10-CM

## 2023-09-04 NOTE — Progress Notes (Signed)
This patient returns to my office for at risk foot care.  This patient requires this care by a professional since this patient will be at risk due to having thrombocytopenia, CKD, and diabetes.  This patient is unable to cut nails himself since the patient cannot reach his nails.These nails are painful walking and wearing shoes.  This patient presents for at risk foot care today. . Patient is taking plavix.  General Appearance  Alert, conversant and in no acute stress.  Vascular  Dorsalis pedis and posterior tibial  pulses are  weakly palpable  bilaterally.  Capillary return is within normal limits  bilaterally. Temperature is within normal limits  bilaterally.  Neurologic  Senn-Weinstein monofilament wire test within normal limits  bilaterally. Muscle power within normal limits bilaterally.  Nails Thick disfigured discolored nails with subungual debris  from hallux to fifth toes bilaterally. No evidence of bacterial infection or drainage bilaterally.  Orthopedic  No limitations of motion  feet .  No crepitus or effusions noted.  No bony pathology or digital deformities noted.  HAV  B/L.  Skin  normotropic skin with no porokeratosis noted bilaterally.  No signs of infections or ulcers noted.     Onychomycosis  Pain in right toes  Pain in left toes  Consent was obtained for treatment procedures.   Mechanical debridement of nails 1-5  bilaterally performed with a nail nipper.  Filed with dremel without incident.    Return office visit    3 months                  Told patient to return for periodic foot care and evaluation due to potential at risk complications.   Gardiner Barefoot DPM

## 2023-09-08 ENCOUNTER — Other Ambulatory Visit (INDEPENDENT_AMBULATORY_CARE_PROVIDER_SITE_OTHER): Payer: Medicare Other

## 2023-09-08 DIAGNOSIS — D72819 Decreased white blood cell count, unspecified: Secondary | ICD-10-CM | POA: Diagnosis not present

## 2023-09-08 DIAGNOSIS — D696 Thrombocytopenia, unspecified: Secondary | ICD-10-CM

## 2023-09-08 LAB — CBC WITH DIFFERENTIAL/PLATELET
Basophils Absolute: 0 10*3/uL (ref 0.0–0.1)
Basophils Relative: 0.8 % (ref 0.0–3.0)
Eosinophils Absolute: 0.2 10*3/uL (ref 0.0–0.7)
Eosinophils Relative: 4.6 % (ref 0.0–5.0)
HCT: 37.9 % — ABNORMAL LOW (ref 39.0–52.0)
Hemoglobin: 12.7 g/dL — ABNORMAL LOW (ref 13.0–17.0)
Lymphocytes Relative: 27.9 % (ref 12.0–46.0)
Lymphs Abs: 1.1 10*3/uL (ref 0.7–4.0)
MCHC: 33.4 g/dL (ref 30.0–36.0)
MCV: 96.5 fl (ref 78.0–100.0)
Monocytes Absolute: 0.5 10*3/uL (ref 0.1–1.0)
Monocytes Relative: 13.6 % — ABNORMAL HIGH (ref 3.0–12.0)
Neutro Abs: 2 10*3/uL (ref 1.4–7.7)
Neutrophils Relative %: 53.1 % (ref 43.0–77.0)
Platelets: 106 10*3/uL — ABNORMAL LOW (ref 150.0–400.0)
RBC: 3.93 Mil/uL — ABNORMAL LOW (ref 4.22–5.81)
RDW: 13.6 % (ref 11.5–15.5)
WBC: 3.8 10*3/uL — ABNORMAL LOW (ref 4.0–10.5)

## 2023-10-11 ENCOUNTER — Telehealth: Payer: Self-pay | Admitting: Cardiovascular Disease

## 2023-10-11 NOTE — Telephone Encounter (Signed)
Pre-operative Risk Assessment    Patient Name: Collin [name]  DOB: 1925/07/19 MRN: [id number]      Request for Surgical Clearance    Procedure:   partial cornea transplant   Date of Surgery:  Clearance 10/19/23                                 Surgeon:  Dr. Valere Dross Surgeon's Group or Practice Name:  [facility]  Phone number:  [phone] Fax number:  [phone]   Type of Clearance Requested:   - Medical  - Pharmacy:  Hold Clopidogrel (Plavix) 4 to 5 days    Type of Anesthesia:  Local    Additional requests/questions:    Signed, Noe Gens   10/11/2023, 2:03 PM

## 2023-10-12 NOTE — Telephone Encounter (Signed)
Patient Name: Collin [name]  DOB: 1924/12/28 MRN: [id number]  Primary Cardiologist: Kristeen Miss, MD  Chart reviewed as part of pre-operative protocol coverage. Pre-op clearance already addressed by colleagues in earlier phone notes. To summarize recommendations:  - Preoperative clearance: -Patient's RCRI score is 0.9% -During patient's visit he was able to complete 4 METS of activity without any difficulty and had no acute problems or changes at that time. -He would be at acceptable risk to proceed with scheduled surgical procedure with no further cardiovascular testing warranted at this time. -Robin Searing, NP  Patient with remote stenting back in 2020.  As long as asymptomatic, can hold Plavix x 5 days prior to the procedure and resume when medically safe to do so.  Will route this bundled recommendation to requesting provider via Epic fax function and remove from pre-op pool. Please call with questions.  Sharlene Dory, PA-C 10/12/2023, 8:40 AM

## 2023-10-13 NOTE — Telephone Encounter (Signed)
I have re-fax to the new fax number that was provided by Kandy Garrison, PA.

## 2023-10-13 NOTE — Telephone Encounter (Signed)
Called requesting office back to see if they have received the updates to the cardiac clearance. Collin [name] was busy at the time and will call back if she has received it or not.

## 2023-10-13 NOTE — Telephone Encounter (Signed)
Kandy Garrison returning call. Requesting fax be sent to # [phone].

## 2023-10-13 NOTE — Telephone Encounter (Signed)
Caller Johnny Bridge) wants a call back regarding an update on patient's pre-op clearance.

## 2023-12-27 HISTORY — PX: CORNEAL TRANSPLANT: SHX108

## 2023-12-30 ENCOUNTER — Other Ambulatory Visit: Payer: Self-pay | Admitting: Cardiovascular Disease

## 2024-01-01 ENCOUNTER — Encounter: Payer: Self-pay | Admitting: Podiatry

## 2024-01-01 ENCOUNTER — Telehealth: Payer: Self-pay | Admitting: Cardiovascular Disease

## 2024-01-01 ENCOUNTER — Ambulatory Visit (INDEPENDENT_AMBULATORY_CARE_PROVIDER_SITE_OTHER): Payer: Medicare Other | Admitting: Podiatry

## 2024-01-01 VITALS — Ht 67.5 in | Wt 169.8 lb

## 2024-01-01 DIAGNOSIS — E119 Type 2 diabetes mellitus without complications: Secondary | ICD-10-CM | POA: Diagnosis not present

## 2024-01-01 DIAGNOSIS — M79675 Pain in left toe(s): Secondary | ICD-10-CM

## 2024-01-01 DIAGNOSIS — B351 Tinea unguium: Secondary | ICD-10-CM | POA: Diagnosis not present

## 2024-01-01 DIAGNOSIS — M79674 Pain in right toe(s): Secondary | ICD-10-CM

## 2024-01-01 NOTE — Telephone Encounter (Signed)
   Primary Cardiologist: Aleene Passe, MD  Chart reviewed as part of pre-operative protocol coverage. Simple dental extractions (1-2 teeth) are considered low risk procedures per guidelines and generally do not require any specific cardiac clearance. It is also generally accepted that for simple extractions and dental cleanings, there is no need to interrupt blood thinner therapy.   SBE prophylaxis is not required for the patient.  I will route this recommendation to the requesting party via Epic fax function and remove from pre-op pool.  Please call with questions.  Rosaline EMERSON Bane, NP-C  01/01/2024, 3:48 PM [address], [address] Office [phone] Fax [phone]

## 2024-01-01 NOTE — Telephone Encounter (Signed)
   Pre-operative Risk Assessment    Patient Name: Collin [name]  DOB: 01-Apr-1925 MRN: [id number]   Date of last office visit: 08/14/23 Date of next office visit: 02/20/24  Request for Surgical Clearance    Procedure:  Dental Extraction - Amount of Teeth to be Pulled:  1  Date of Surgery:  Clearance 01/02/24                                Surgeon:  Dr. Alex Socks Group or Practice Name:  [facility]  Phone number:  [phone] Fax number:  [phone]   Type of Clearance Requested:   - Medical  - Pharmacy:  Hold Aspirin  and Clopidogrel  (Plavix ) TBD by cardiologist   Type of Anesthesia:  General    Additional requests/questions:  Please fax a copy of medical clearance to the surgeon's office.  Bonney Manus Bump   01/01/2024, 3:25 PM

## 2024-01-01 NOTE — Progress Notes (Signed)
This patient returns to my office for at risk foot care.  This patient requires this care by a professional since this patient will be at risk due to having thrombocytopenia, CKD, and diabetes.  This patient is unable to cut nails himself since the patient cannot reach his nails.These nails are painful walking and wearing shoes.  This patient presents for at risk foot care today. . Patient is taking plavix.  General Appearance  Alert, conversant and in no acute stress.  Vascular  Dorsalis pedis and posterior tibial  pulses are  weakly palpable  bilaterally.  Capillary return is within normal limits  bilaterally. Temperature is within normal limits  bilaterally.  Neurologic  Senn-Weinstein monofilament wire test within normal limits  bilaterally. Muscle power within normal limits bilaterally.  Nails Thick disfigured discolored nails with subungual debris  from hallux to fifth toes bilaterally. No evidence of bacterial infection or drainage bilaterally.  Orthopedic  No limitations of motion  feet .  No crepitus or effusions noted.  No bony pathology or digital deformities noted.  HAV  B/L.  Skin  normotropic skin with no porokeratosis noted bilaterally.  No signs of infections or ulcers noted.     Onychomycosis  Pain in right toes  Pain in left toes  Consent was obtained for treatment procedures.   Mechanical debridement of nails 1-5  bilaterally performed with a nail nipper.  Filed with dremel without incident.    Return office visit    3 months                  Told patient to return for periodic foot care and evaluation due to potential at risk complications.   Gardiner Barefoot DPM

## 2024-01-24 ENCOUNTER — Other Ambulatory Visit: Payer: Self-pay | Admitting: Cardiovascular Disease

## 2024-02-01 ENCOUNTER — Encounter: Payer: Self-pay | Admitting: Internal Medicine

## 2024-02-01 NOTE — Patient Instructions (Addendum)
      Blood work was ordered.       Medications changes include :   None      Return in about 6 months (around 08/01/2024) for follow up.

## 2024-02-01 NOTE — Progress Notes (Signed)
 Subjective:    Patient ID: Collin [name], male    DOB: 31-Dec-1924, 89 y.o.   MRN: [id number]     HPI Zahki is here for follow up of his chronic medical problems.  Had a tooth pulled out.  Had cornea transplant 2 months ago -- will need to have another one - this one did not take.    Got hearing aids.  He does not wear much.  Still bowling - 1-2 times a week.    Goes to bed late and sleeps in late.  He finds this is only a problem when he has an early appointment.    Medications and allergies reviewed with patient and updated if appropriate.  Current Outpatient Medications on File Prior to Visit  Medication Sig Dispense Refill   acetaminophen  (TYLENOL ) 500 MG tablet Take 500 mg by mouth every 6 (six) hours as needed for moderate pain or headache.      alfuzosin  (UROXATRAL ) 10 MG 24 hr tablet Take 10 mg by mouth daily.      aspirin  EC 81 MG tablet Take 1 tablet (81 mg total) by mouth daily.     carvedilol  (COREG ) 3.125 MG tablet TAKE 1 TABLET TWICE A DAY 180 tablet 3   clopidogrel  (PLAVIX ) 75 MG tablet TAKE 1 TABLET DAILY 90 tablet 2   fenofibrate  (TRICOR ) 48 MG tablet TAKE 1 TABLET DAILY 90 tablet 2   finasteride  (PROSCAR ) 5 MG tablet Take 5 mg by mouth daily.     losartan  (COZAAR ) 50 MG tablet TAKE 1/2 TABLET IN THE     MORNING AND AT BEDTIME 90 tablet 2   metroNIDAZOLE (METROGEL) 0.75 % gel Apply topically 2 (two) times daily.     Multiple Vitamin (MULTIVITAMIN) tablet Take 1 tablet by mouth daily.     nitroGLYCERIN  (NITROSTAT ) 0.4 MG SL tablet Place 1 tablet (0.4 mg total) under the tongue every 5 (five) minutes as needed for chest pain. 25 tablet 6   prednisoLONE acetate (PRED FORTE) 1 % ophthalmic suspension SMARTSIG:1 Drop(s) Left Eye Every 2 Hours     rosuvastatin  (CRESTOR ) 5 MG tablet TAKE 1 TABLET AT BEDTIME 90 tablet 3   sodium chloride  (OCEAN) 0.65 % SOLN nasal spray Place 1 spray into both nostrils as needed for congestion.     triamcinolone  ointment  (KENALOG ) 0.1 % Apply 1 application topically daily.      No current facility-administered medications on file prior to visit.     Review of Systems  Constitutional:  Negative for fever.  HENT:  Positive for rhinorrhea and sneezing (occ).   Respiratory:  Positive for shortness of breath (with exertion at times - chronic w/o change). Negative for cough and wheezing.   Cardiovascular:  Negative for chest pain, palpitations and leg swelling.  Gastrointestinal:  Negative for abdominal pain.  Neurological:  Negative for dizziness, light-headedness and headaches.       Objective:   Vitals:   02/02/24 1004  BP: 116/60  Pulse: (!) 54  Temp: 98.3 F (36.8 C)  SpO2: 96%   BP Readings from Last 3 Encounters:  02/02/24 116/60  08/14/23 (!) 98/54  08/01/23 116/72   Wt Readings from Last 3 Encounters:  02/02/24 171 lb (77.6 kg)  01/01/24 169 lb 12.8 oz (77 kg)  08/14/23 169 lb 12.8 oz (77 kg)   Body mass index is 26.39 kg/m.    Physical Exam Constitutional:      General: He is not in acute distress.  Appearance: Normal appearance. He is not ill-appearing.  HENT:     Head: Normocephalic and atraumatic.  Eyes:     Conjunctiva/sclera: Conjunctivae normal.  Cardiovascular:     Rate and Rhythm: Normal rate and regular rhythm.     Heart sounds: Normal heart sounds.  Pulmonary:     Effort: Pulmonary effort is normal. No respiratory distress.     Breath sounds: Normal breath sounds. No wheezing or rales.  Musculoskeletal:     Right lower leg: No edema.     Left lower leg: No edema.  Skin:    General: Skin is warm and dry.     Findings: No rash.  Neurological:     Mental Status: He is alert. Mental status is at baseline.  Psychiatric:        Mood and Affect: Mood normal.        Lab Results  Component Value Date   WBC 3.8 (L) 09/08/2023   HGB 12.7 (L) 09/08/2023   HCT 37.9 (L) 09/08/2023   PLT 106.0 (L) 09/08/2023   GLUCOSE 111 (H) 08/01/2023   CHOL 102 08/14/2023    TRIG 190 (H) 08/14/2023   HDL 39 (L) 08/14/2023   LDLDIRECT 58.0 08/01/2023   LDLCALC 32 08/14/2023   ALT 13 08/14/2023   AST 17 08/01/2023   NA 140 08/01/2023   K 4.7 08/01/2023   CL 105 08/01/2023   CREATININE 1.82 (H) 08/01/2023   BUN 28 (H) 08/01/2023   CO2 27 08/01/2023   TSH 3.40 07/08/2019   INR 1.04 11/04/2009   HGBA1C 6.5 08/01/2023   MICROALBUR <0.7 01/08/2018     Assessment & Plan:    See Problem List for Assessment and Plan of chronic medical problems.

## 2024-02-02 ENCOUNTER — Ambulatory Visit (INDEPENDENT_AMBULATORY_CARE_PROVIDER_SITE_OTHER): Payer: Medicare Other | Admitting: Internal Medicine

## 2024-02-02 VITALS — BP 116/60 | HR 54 | Temp 98.3°F | Ht 67.5 in | Wt 171.0 lb

## 2024-02-02 DIAGNOSIS — N1831 Chronic kidney disease, stage 3a: Secondary | ICD-10-CM

## 2024-02-02 DIAGNOSIS — D696 Thrombocytopenia, unspecified: Secondary | ICD-10-CM

## 2024-02-02 DIAGNOSIS — E785 Hyperlipidemia, unspecified: Secondary | ICD-10-CM

## 2024-02-02 DIAGNOSIS — E1122 Type 2 diabetes mellitus with diabetic chronic kidney disease: Secondary | ICD-10-CM

## 2024-02-02 DIAGNOSIS — I10 Essential (primary) hypertension: Secondary | ICD-10-CM | POA: Diagnosis not present

## 2024-02-02 DIAGNOSIS — Z7984 Long term (current) use of oral hypoglycemic drugs: Secondary | ICD-10-CM

## 2024-02-02 DIAGNOSIS — I2510 Atherosclerotic heart disease of native coronary artery without angina pectoris: Secondary | ICD-10-CM | POA: Diagnosis not present

## 2024-02-02 DIAGNOSIS — N1832 Chronic kidney disease, stage 3b: Secondary | ICD-10-CM

## 2024-02-02 LAB — CBC WITH DIFFERENTIAL/PLATELET
Basophils Absolute: 0 10*3/uL (ref 0.0–0.1)
Basophils Relative: 1 % (ref 0.0–3.0)
Eosinophils Absolute: 0.2 10*3/uL (ref 0.0–0.7)
Eosinophils Relative: 6.7 % — ABNORMAL HIGH (ref 0.0–5.0)
HCT: 41.6 % (ref 39.0–52.0)
Hemoglobin: 13.8 g/dL (ref 13.0–17.0)
Lymphocytes Relative: 29.5 % (ref 12.0–46.0)
Lymphs Abs: 1 10*3/uL (ref 0.7–4.0)
MCHC: 33.1 g/dL (ref 30.0–36.0)
MCV: 97.5 fL (ref 78.0–100.0)
Monocytes Absolute: 0.4 10*3/uL (ref 0.1–1.0)
Monocytes Relative: 12.7 % — ABNORMAL HIGH (ref 3.0–12.0)
Neutro Abs: 1.7 10*3/uL (ref 1.4–7.7)
Neutrophils Relative %: 50.1 % (ref 43.0–77.0)
Platelets: 106 10*3/uL — ABNORMAL LOW (ref 150.0–400.0)
RBC: 4.27 Mil/uL (ref 4.22–5.81)
RDW: 13.4 % (ref 11.5–15.5)
WBC: 3.4 10*3/uL — ABNORMAL LOW (ref 4.0–10.5)

## 2024-02-02 LAB — LIPID PANEL
Cholesterol: 114 mg/dL (ref 0–200)
HDL: 44.8 mg/dL (ref >39.00)
LDL Cholesterol: 37 mg/dL (ref 0–99)
NonHDL: 69.28
Total CHOL/HDL Ratio: 3
Triglycerides: 160 mg/dL — ABNORMAL HIGH (ref 0.0–149.0)
VLDL: 32 mg/dL (ref 0.0–40.0)

## 2024-02-02 LAB — COMPREHENSIVE METABOLIC PANEL
ALT: 15 U/L (ref 0–53)
AST: 18 U/L (ref 0–37)
Albumin: 4.7 g/dL (ref 3.5–5.2)
Alkaline Phosphatase: 53 U/L (ref 39–117)
BUN: 27 mg/dL — ABNORMAL HIGH (ref 6–23)
CO2: 27 meq/L (ref 19–32)
Calcium: 9.3 mg/dL (ref 8.4–10.5)
Chloride: 104 meq/L (ref 96–112)
Creatinine, Ser: 1.92 mg/dL — ABNORMAL HIGH (ref 0.40–1.50)
GFR: 29.02 mL/min — ABNORMAL LOW (ref >60.00)
Glucose, Bld: 96 mg/dL (ref 70–99)
Potassium: 4.6 meq/L (ref 3.5–5.1)
Sodium: 142 meq/L (ref 135–145)
Total Bilirubin: 0.6 mg/dL (ref 0.2–1.2)
Total Protein: 6.8 g/dL (ref 6.0–8.3)

## 2024-02-02 LAB — HEMOGLOBIN A1C: Hgb A1c MFr Bld: 6.5 % (ref 4.6–6.5)

## 2024-02-02 NOTE — Assessment & Plan Note (Signed)
Chronic Check lipid panel  Continue Crestor 5 mg daily, fenofibrate 48 mg daily

## 2024-02-02 NOTE — Assessment & Plan Note (Signed)
Chronic CBC

## 2024-02-02 NOTE — Assessment & Plan Note (Addendum)
 Chronic Follows with cardiology Continue Coreg  3.125 mg twice daily, losartan  25 mg twice daily, Plavix  75 mg daily, fenofibrate  48 mg daily, Crestor  5 mg daily Lipid panel, CBC, CMP

## 2024-02-02 NOTE — Assessment & Plan Note (Signed)
 Chronic Stable CMP, CBC

## 2024-02-02 NOTE — Assessment & Plan Note (Signed)
 Chronic  Lab Results  Component Value Date   HGBA1C 6.5 08/01/2023   Sugars controlled Check A1c Continue lifestyle control

## 2024-02-02 NOTE — Assessment & Plan Note (Signed)
Chronic Blood pressure well controlled CMP, CBC Continue Coreg 3.125 mg twice daily, losartan 25 mg twice daily

## 2024-02-04 ENCOUNTER — Encounter: Payer: Self-pay | Admitting: Internal Medicine

## 2024-02-09 ENCOUNTER — Ambulatory Visit (HOSPITAL_COMMUNITY)
Admission: EM | Admit: 2024-02-09 | Discharge: 2024-02-09 | Disposition: A | Discharge status: Home / Self Care | Payer: Medicare Other | Attending: Emergency Medicine | Admitting: Emergency Medicine

## 2024-02-09 ENCOUNTER — Encounter (HOSPITAL_COMMUNITY): Payer: Self-pay

## 2024-02-09 DIAGNOSIS — J3489 Other specified disorders of nose and nasal sinuses: Secondary | ICD-10-CM

## 2024-02-09 DIAGNOSIS — J029 Acute pharyngitis, unspecified: Secondary | ICD-10-CM | POA: Diagnosis not present

## 2024-02-09 DIAGNOSIS — J069 Acute upper respiratory infection, unspecified: Secondary | ICD-10-CM

## 2024-02-09 LAB — POC COVID19/FLU A&B COMBO
Covid Antigen, POC: NEGATIVE
Influenza A Antigen, POC: NEGATIVE
Influenza B Antigen, POC: NEGATIVE

## 2024-02-09 MED ORDER — BENZONATATE 100 MG PO CAPS: 100.0000 mg | ORAL_CAPSULE | Freq: Three times a day (TID) | ORAL | 0 refills | Status: DC

## 2024-02-09 MED ORDER — FLUTICASONE PROPIONATE 50 MCG/ACT NA SUSP: 1.0000 | Freq: Every day | NASAL | 2 refills | Status: AC

## 2024-02-09 MED ORDER — GUAIFENESIN ER 600 MG PO TB12: 1200.0000 mg | ORAL_TABLET | Freq: Two times a day (BID) | ORAL | 0 refills | Status: AC

## 2024-02-09 NOTE — ED Triage Notes (Addendum)
Pt c/o of nasal congestion, sore throat, and cough.   Start date: 02/18/2024  Home Interventions: Listerine, Tylenol (Last dose yesterday night)

## 2024-02-09 NOTE — Discharge Instructions (Addendum)
Your COVID and flu testing were negative, your sore throat and increasing congestion are most likely due to a different viral illness.  Please make sure you are resting, drinking plenty of fluids and taking Tylenol as needed.  You can use the nasal spray to see if this helps with congestion.  He can use the cough medicine every 8 hours.  Mucinex will help break up any secretions.  Symptoms should improve over the next 5 to 7 days, if no improvement or any changes please follow-up with your primary care provider or return to clinic for reevaluation.

## 2024-02-09 NOTE — ED Provider Notes (Signed)
 [facility]    CSN: [id number] Arrival date & time: 02/09/24  1045      History   Chief Complaint Chief Complaint  Patient presents with   Sore Throat   Cough    HPI Collin [name] is a 89 y.o. male.   Patient presents to clinic complaining of nasal congestion, sore throat and a dry nonproductive cough for the past few days.  He has not had any fevers.  No chest pain, wheezing or shortness of breath.  He has had some congestion and rhinorrhea for a while now but recently developed a sore throat.  No recent sick contacts.  Has been using Listerine and taking Tylenol, last dose of Tylenol was last night.  The history is provided by the patient and medical records.  Sore Throat  Cough   Past Medical History:  Diagnosis Date   Allergy    Anemia    Arthritis    gout   Bladder cancer (HCC)    BPH (benign prostatic hyperplasia)    CAD (coronary artery disease) 2005   a. diaphragmatic MI s/p DES to RCA 2005, residual disease. b. [place] 11/2019 s/p DES to PDA, mRCA, mid Cx.   Cancer (HCC) 02/28/2001   Left ureter cancer/bladder   Carcinoma, renal cell (HCC)    Cardiomyopathy (HCC)    a. EF 45-50% by echo 12/2018.   Carotid artery disease (HCC)    Doppler, September, 2010, normal, no carotid artery disease   CKD (chronic kidney disease), stage III (HCC)    COPD (chronic obstructive pulmonary disease) (HCC)    Dysfunction of eustachian tube    Ejection fraction    EF 55%, echo, January, 2009   Encounter for long-term (current) use of other medications    Enlarged prostate    GERD (gastroesophageal reflux disease)    Gynecomastia    b/l   HTN (hypertension)    Hx of cystoscopy 11/02/12   negative   Hx of skin cancer, basal cell    Hypercholesterolemia    Hyperlipidemia    Low back pain syndrome    Myocardial infarction (HCC)    hx acute   Nocturia    Organic impotence    Pulmonary nodule    Chest CT, followed   Reactive airway disease    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   Staph infection 04/1997   Thrombocytopenia (HCC)     Patient Active Problem List   Diagnosis Date Noted   Acute cough 03/15/2023   COVID 03/15/2023   Calf muscle weakness 01/30/2023   Unstable angina (HCC)    Shortness of breath 11/01/2019   Thrombocytopenia (HCC)    Skin cancer    Pulmonary nodule    Nocturia    Myocardial infarction (HCC)    Low back pain syndrome    COPD (chronic obstructive pulmonary disease) (HCC)    BPH (benign prostatic hyperplasia)    Bladder cancer (HCC)    Arthritis    CKD (chronic kidney disease), stage III 07/10/2017   Diabetes (HCC) 04/15/2017   Fatty liver 06/18/2016   Rash and nonspecific skin eruption 06/13/2016   H/O bacterial endocarditis 04/06/2016   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   Gynecomastia    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   Dermatochalasis 07/04/2012   Bilateral pseudophakia 07/04/2012   Spinal stenosis, lumbar 05/12/2012   Epiretinal membrane 05/01/2012   Degenerative drusen 05/01/2012   CAD (  coronary artery disease)    Carotid artery disease (HCC)    Fuchs' corneal dystrophy 01/04/2012   Brachial neuritis or radiculitis 12/07/2009   SKIN CANCER, HX OF 12/07/2009   Renal cell carcinoma (HCC) 07/18/2009   ANEMIA, 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   GERD 07/18/2009   Essential hypertension 04/23/2008   Dyslipidemia 01/25/2007   History of cancer, left ureteral 02/28/2001    Past Surgical History:  Procedure Laterality Date   BACK SURGERY  02/1996   bladder tumors  2003, 2004, 01/2009   removed   CARDIAC CATHETERIZATION     CHOLECYSTECTOMY  07/2003   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   sutured, right   CORNEAL TRANSPLANT  10/2008   left   CORNEAL TRANSPLANT  2025   CORONARY STENT INTERVENTION  11/28/2019   CORONARY STENT INTERVENTION N/A 11/28/2019   Procedure: CORONARY STENT INTERVENTION;  Surgeon: Kathleene Hazel,  MD;  Location: [facility];  Service: Cardiovascular;  Laterality: N/A;   LEFT HEART CATH AND CORONARY ANGIOGRAPHY N/A 11/27/2019   Procedure: LEFT HEART CATH AND CORONARY ANGIOGRAPHY;  Surgeon: Kathleene Hazel, MD;  Location: [facility];  Service: Cardiovascular;  Laterality: N/A;   NEPHRECTOMY  02/2001   left kidney, ureter   osteomyelitis     staph aureus   URETERECTOMY  2003   for cancer       Home Medications    Prior to Admission medications   Medication Sig Start Date End Date Taking? Authorizing Provider  benzonatate (TESSALON) 100 MG capsule Take 1 capsule (100 mg total) by mouth every 8 (eight) hours. 02/09/24  Yes Rinaldo Ratel, [place] N, FNP  fluticasone [facility]) 50 MCG/ACT nasal spray Place 1 spray into both nostrils daily. 02/09/24  Yes Rinaldo Ratel, [place] N, FNP  guaiFENesin (MUCINEX) 600 MG 12 hr tablet Take 2 tablets (1,200 mg total) by mouth 2 (two) times daily for 5 days. 02/09/24 02/14/24 Yes Rinaldo Ratel, [place] N, FNP  acetaminophen (TYLENOL) 500 MG tablet Take 500 mg by mouth every 6 (six) hours as needed for moderate pain or headache.     [provider]  alfuzosin (UROXATRAL) 10 MG 24 hr tablet Take 10 mg by mouth daily.  01/19/12   [provider]  aspirin EC 81 MG tablet Take 1 tablet (81 mg total) by mouth daily. 11/20/19   Nahser, Deloris Ping, MD  carvedilol (COREG) 3.125 MG tablet TAKE 1 TABLET TWICE A DAY 04/05/23   Nahser, Deloris Ping, MD  clopidogrel (PLAVIX) 75 MG tablet TAKE 1 TABLET DAILY 01/25/24   Nahser, Deloris Ping, MD  fenofibrate (TRICOR) 48 MG tablet TAKE 1 TABLET DAILY 01/02/24   Nahser, Deloris Ping, MD  finasteride (PROSCAR) 5 MG tablet Take 5 mg by mouth daily.    [provider]  losartan (COZAAR) 50 MG tablet TAKE 1/2 TABLET IN THE     MORNING AND AT BEDTIME 01/25/24   Nahser, Deloris Ping, MD  metroNIDAZOLE (METROGEL) 0.75 % gel Apply topically 2 (two) times daily. 08/12/20   [provider]  Multiple Vitamin  (MULTIVITAMIN) tablet Take 1 tablet by mouth daily.    [provider]  nitroGLYCERIN (NITROSTAT) 0.4 MG SL tablet Place 1 tablet (0.4 mg total) under the tongue every 5 (five) minutes as needed for chest pain. 01/25/21   Nahser, Deloris Ping, MD  prednisoLONE acetate (PRED FORTE) 1 % ophthalmic suspension SMARTSIG:1 Drop(s) Left Eye Every 2 Hours    [provider]  rosuvastatin (CRESTOR) 5 MG tablet TAKE 1  TABLET AT BEDTIME 04/05/23   Nahser, Deloris Ping, MD  sodium chloride (OCEAN) 0.65 % SOLN nasal spray Place 1 spray into both nostrils as needed for congestion.    [provider]  triamcinolone ointment (KENALOG) 0.1 % Apply 1 application topically daily.     [provider]    Family History Family History  Problem Relation Age of Onset   Heart failure Mother 16   Diabetes Mother    Coronary artery disease Brother    Diabetes Brother    Drug abuse Brother    Diabetes Sister    Diabetes Sister     Social History Social History   Tobacco Use   Smoking status: Former    Types: Pipe    Quit date: 12/27/1991    Years since quitting: 32.1   Smokeless tobacco: Former   Tobacco comments:    Quit 20-30 years ago as of 2012  Vaping Use   Vaping status: Never Used  Substance Use Topics   Alcohol use: No    Alcohol/week: 0.0 standard drinks of alcohol   Drug use: No     Allergies   Crestor [rosuvastatin], Atorvastatin, Peanut oil, and Trandolapril   Review of Systems Review of Systems  Per HPI   Physical Exam Triage Vital Signs ED Triage Vitals  Encounter Vitals Group     BP 02/09/24 1112 132/77     Systolic BP Percentile --      Diastolic BP Percentile --      Pulse Rate 02/09/24 1112 60     Resp 02/09/24 1112 18     Temp 02/09/24 1112 97.8 F (36.6 C)     Temp Source 02/09/24 1112 Oral     SpO2 02/09/24 1112 97 %     Weight --      Height --      Head Circumference --      Peak Flow --      Pain Score 02/09/24 1109 0     Pain  Loc --      Pain Education --      Exclude from Growth Chart --    No data found.  Updated Vital Signs BP 132/77 (BP Location: Left Arm)   Pulse 60   Temp 97.8 F (36.6 C) (Oral)   Resp 18   SpO2 97%   Visual Acuity Right Eye Distance:   Left Eye Distance:   Bilateral Distance:    Right Eye Near:   Left Eye Near:    Bilateral Near:     Physical Exam Vitals and nursing note reviewed.  Constitutional:      Appearance: Normal appearance.  HENT:     Head: Normocephalic and atraumatic.     Right Ear: External ear normal.     Left Ear: External ear normal.     Nose: Congestion and rhinorrhea present.     Mouth/Throat:     Mouth: Mucous membranes are moist.     Pharynx: Posterior oropharyngeal erythema present.  Eyes:     Conjunctiva/sclera: Conjunctivae normal.  Cardiovascular:     Rate and Rhythm: Normal rate.  Pulmonary:     Effort: Pulmonary effort is normal. No respiratory distress.  Musculoskeletal:        General: Normal range of motion.  Skin:    General: Skin is warm and dry.  Neurological:     General: No focal deficit present.     Mental Status: He is alert and oriented to person, place, and  time.  Psychiatric:        Mood and Affect: Mood normal.        Behavior: Behavior normal.      UC Treatments / Results  Labs (all labs ordered are listed, but only abnormal results are displayed) Labs Reviewed  POC COVID19/FLU A&B COMBO    EKG   Radiology No results found.  Procedures Procedures (including critical care time)  Medications Ordered in UC Medications - No data to display  Initial Impression / Assessment and Plan / UC Course  I have reviewed the triage vital signs and the nursing notes.  Pertinent labs & imaging results that were available during my care of the patient were reviewed by me and considered in my medical decision making (see chart for details).  Vitals in triage reviewed, patient is hemodynamically stable.  Lungs are  vesicular, heart with regular rate and rhythm.  Congestion, rhinorrhea postnasal drip present on physical exam.  POC testing negative for COVID and flu.  Symptomatic management for cough, congestion and rhinorrhea discussed.  This may be due to viral illness, symptomatic management for pharyngitis reviewed.  Plan of care, follow-up care return precautions given, no questions at this time.     Final Clinical Impressions(s) / UC Diagnoses   Final diagnoses:  Viral URI with cough  Pharyngitis, unspecified etiology  Rhinorrhea     Discharge Instructions      Your COVID and flu testing were negative, your sore throat and increasing congestion are most likely due to a different viral illness.  Please make sure you are resting, drinking plenty of fluids and taking Tylenol as needed.  You can use the nasal spray to see if this helps with congestion.  He can use the cough medicine every 8 hours.  Mucinex will help break up any secretions.  Symptoms should improve over the next 5 to 7 days, if no improvement or any changes please follow-up with your primary care provider or return to clinic for reevaluation.     ED Prescriptions     Medication Sig Dispense Auth. Provider   fluticasone (FLONASE) 50 MCG/ACT nasal spray Place 1 spray into both nostrils daily. 9.9 mL Rinaldo Ratel, [place] N, FNP   guaiFENesin (MUCINEX) 600 MG 12 hr tablet Take 2 tablets (1,200 mg total) by mouth 2 (two) times daily for 5 days. 20 tablet Rinaldo Ratel, [place] N, [place]   benzonatate (TESSALON) 100 MG capsule Take 1 capsule (100 mg total) by mouth every 8 (eight) hours. 21 capsule Collin [name], [place] N, [place]      PDMP not reviewed this encounter.   Tre Sanker, [place] N, [place] 02/09/24 [phone]

## 2024-02-19 NOTE — Progress Notes (Unsigned)
 Cardiology Office Note   Date:  02/20/2024   ID:  Charvis, Lightner [birth year], MRN [id number]  PCP:  Pincus Sanes, MD  Cardiologist:  Kristeen Miss, MD , previous patient of Dr. Myrtis Ser  Chief Complaint  Patient presents with   Coronary Artery Disease   Hyperlipidemia   Hypertension        Problem List 1. CAD- s/p stenting in 2005.  2. Essential HTN       Collin [name] is a 89 y.o. male who presents for follow-up of coronary disease.  Had a stent placed by Dr. Juanda Chance in 2005.  No CP , no dyspnea   Previously worked  in the [employer], [employer] for [employer]   Still active Walks 30 minutes a day on the treadmill.   Goes bowling twice a week Took Atorvastatin for years but stopped due to leg pain .  Last lipids were drawn in Dec.  - showed markedly elevated Trig levels and high chol levels.   April 21, 2017:  Collin [name] has a hx of CAD Hyperlipidemia Was recently diagnosed with DM and He has greatly improved his diet. He's cut back on lots of carbohydrates. He's lost about 5 pounds. He's noticed that his blood pressure has been low. This morning he took only half of his valsartan tablet.  Takes SBE prophylasix - has done so for years.  No prosthetic valve and no artificial joints  Jan. 8, 2019:  Collin [name] is seen back for follow-up visit for his coronary artery disease and diabetes mellitus.   He walks on the treadmill 4-5 times a week for 30 minutes at a time.  He denies any angina while walking the treadmill.  He still bowls once or twice a week.  He notes that his blood pressure has been somewhat variable.  It seems to go up in the evenings.  Eats prepared meals every meal.      July 24, 2018:    Collin [name] is seen today for follow up of his CAD Has pain in his calves with walking -  Resolves with walking  Had a [employer] screening of PAD screening  No CP  Still works out on the treadmill, bowl one a week   Recent lab work shows that his triglyceride  level is markedly elevated at 613.  Total cholesterol is 179.  His LDL is 77. Eats lots of cereal, through the day .     January 21, 2019:  Strained his chest recently while lifting a large box Has had some shortness of breath  Saw his primary   Echo shows a mildly depressed left ventricular systolic function with an ejection fraction of 45 to 50%.  There is hypokinesis of the inferior wall.  He has grade 1 diastolic dysfunction.  He has mild mitral regurgitation. The pain was clearly MSK.    Feeling better.   Breathing seems to be better .   November 20, 2019:   Collin [name] is seen today for a follow-up visit. He was recently seen by Chelsea Aus, PA with symptoms consistent with unstable angina.  He wanted to discuss it further and is seeing me today for further evaluation.  Pain / heaviness  - center of chest  Worse with walking or carrying groceries from car Had a stent in 2005.  This current pain is similar to his angina .  No diaphoresis,  No syncope.  No presyncope  Last for several minutes.  Or until  he stops to rest  Was raking leaves for 20 min and had angina   January 22, 2020:  Collin [name] seen back for a follow-up visit.  He was complaining of angina when I saw him back in November, 2020. He was found to have significant disease in his right coronary artery and circumflex artery.  He had a stent placed in his mid right coronary artery as well as the posterior descending artery.  He also had a stent placed in the mid circumflex artery. The left anterior descending artery has a moderate to severe stenosis that has been treated medically. He is greatly improved.  The chest pressure has resolved.  Has not had to take any NTG. Still has some dyspnea.  Walks on the treadmill without any issues.   Was not able to do cardiac rehab due to covid.   He saw Tereso Newcomer, PA on January 4.  His renal function was fairly stable.  Creatinine was 1.72.  Potassium is 4.5.  Total cholesterol  is 118.  HDL is 42.  Triglyceride levels 223.  The LDL is 41.   July 27, 2020:  Collin [name] is seen today for follow-up of his coronary artery disease.  He has had a stent placed in his right coronary artery as well as the posterior descending artery.  He also has a stent in his left circumflex artery. Looks great for 89.   Has been  out bowling this am.   Is very active  Breathing is good Would like to try a glass of wine at night - I gave the [facility]    Jan. 31, 2022: Mr [name] is seen for follow up of his CAD, Still using his treadmill,  Goes bowling  Has some chest soreness if he walks for a long time  Might last 1-2 minutes.   Has not worsened in a long time  We discussed symptoms that would likely warrant a close look ( pain lasting lnger, occurring sooner, more severe pain )   His trigs are elevated.   Will start fenofibrate 48 mg a day   LDL looks good   Feb. 17, 2023: Collin [name] is seen today for follow up of his CAD Overall doing well.   Has noticed some generalized weakness in his legs.   Feb. 19, 2024  Collin [name] is seen today for follow up of his CAD, Has some generalized weakness in his legs  He stopped his statin,  feels better   He is going to try taking his statin medication at a lower dose to see if the pains come back.  If his pains do return, I have asked him to call us so that we can start him on a different medication that is not a statin   He can he complains of pain behind his right knee.  Denies any leg swelling    Feb. 25, 2025 Collin [name] is seen for follow up of his CAD ,HLD, HTN  Had a GI bug this past Sunday   Is better now  No CP ,   Walks on his treadmill several times a week  Bowls once a week    Past Medical History:  Diagnosis Date   Allergy    Anemia    Arthritis    gout   Bladder cancer (HCC)    BPH (benign prostatic hyperplasia)    CAD (coronary artery disease) 2005   a. diaphragmatic MI s/p DES to RCA 2005, residual disease. b. [place]  11/2019 s/p DES to PDA, mRCA, mid Cx.   Cancer (HCC) 02/28/2001   Left ureter cancer/bladder   Carcinoma, renal cell (HCC)    Cardiomyopathy (HCC)    a. EF 45-50% by echo 12/2018.   Carotid artery disease (HCC)    Doppler, September, 2010, normal, no carotid artery disease   CKD (chronic kidney disease), stage III (HCC)    COPD (chronic obstructive pulmonary disease) (HCC)    Dysfunction of eustachian tube    Ejection fraction    EF 55%, echo, January, 2009   Encounter for long-term (current) use of other medications    Enlarged prostate    GERD (gastroesophageal reflux disease)    Gynecomastia    b/l   HTN (hypertension)    Hx of cystoscopy 11/02/12   negative   Hx of skin cancer, basal cell    Hypercholesterolemia    Hyperlipidemia    Low back pain syndrome    Myocardial infarction (HCC)    hx acute   Nocturia    Organic impotence    Pulmonary nodule    Chest CT, followed   Reactive airway disease    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   Staph infection 04/1997   Thrombocytopenia [facility])     Past Surgical History:  Procedure Laterality Date   BACK SURGERY  02/1996   bladder tumors  2003, 2004, 01/2009   removed   CARDIAC CATHETERIZATION     CHOLECYSTECTOMY  07/2003   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   sutured, right   CORNEAL TRANSPLANT  10/2008   left   CORNEAL TRANSPLANT  2025   CORONARY STENT INTERVENTION  11/28/2019   CORONARY STENT INTERVENTION N/A 11/28/2019   Procedure: CORONARY STENT INTERVENTION;  Surgeon: Kathleene Hazel, MD;  Location: [facility] INVASIVE CV LAB;  Service: Cardiovascular;  Laterality: N/A;   LEFT HEART CATH AND CORONARY ANGIOGRAPHY N/A 11/27/2019   Procedure: LEFT HEART CATH AND CORONARY ANGIOGRAPHY;  Surgeon: Kathleene Hazel, MD;  Location: [facility] INVASIVE CV LAB;  Service: Cardiovascular;  Laterality: N/A;   NEPHRECTOMY  02/2001   left kidney, ureter   osteomyelitis     staph aureus   URETERECTOMY  2003   for  cancer    Patient Active Problem List   Diagnosis Date Noted   CAD (coronary artery disease)     Priority: High   Essential hypertension 04/23/2008    Priority: High   Dyslipidemia 01/25/2007    Priority: High   Acute cough 03/15/2023   COVID 03/15/2023   Calf muscle weakness 01/30/2023   Unstable angina (HCC)    Shortness of breath 11/01/2019   Thrombocytopenia (HCC)    Skin cancer    Pulmonary nodule    Nocturia    Myocardial infarction (HCC)    Low back pain syndrome    COPD (chronic obstructive pulmonary disease) (HCC)    BPH (benign prostatic hyperplasia)    Bladder cancer (HCC)    Arthritis    CKD (chronic kidney disease), stage III 07/10/2017   Diabetes (HCC) 04/15/2017   Fatty liver 06/18/2016   Rash and nonspecific skin eruption 06/13/2016   H/O bacterial endocarditis 04/06/2016   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   Gynecomastia    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   Dermatochalasis 07/04/2012   Bilateral pseudophakia 07/04/2012   Spinal stenosis, lumbar 05/12/2012   Epiretinal membrane 05/01/2012   Degenerative drusen 05/01/2012   Carotid artery disease (  HCC)    Fuchs' corneal dystrophy 01/04/2012   Brachial neuritis or radiculitis 12/07/2009   SKIN CANCER, HX OF 12/07/2009   Renal cell carcinoma (HCC) 07/18/2009   ANEMIA, 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   GERD 07/18/2009   History of cancer, left ureteral 02/28/2001      Current Outpatient Medications  Medication Sig Dispense Refill   acetaminophen (TYLENOL) 500 MG tablet Take 500 mg by mouth every 6 (six) hours as needed for moderate pain or headache.      alfuzosin (UROXATRAL) 10 MG 24 hr tablet Take 10 mg by mouth daily.      aspirin EC 81 MG tablet Take 1 tablet (81 mg total) by mouth daily.     carvedilol (COREG) 3.125 MG tablet TAKE 1 TABLET TWICE A DAY 180 tablet 3   clopidogrel (PLAVIX) 75 MG tablet TAKE 1 TABLET DAILY 90 tablet 2    fenofibrate (TRICOR) 48 MG tablet TAKE 1 TABLET DAILY 90 tablet 2   finasteride (PROSCAR) 5 MG tablet Take 5 mg by mouth daily.     fluticasone (FLONASE) 50 MCG/ACT nasal spray Place 1 spray into both nostrils daily. 9.9 mL 2   losartan (COZAAR) 50 MG tablet TAKE 1/2 TABLET IN THE     MORNING AND AT BEDTIME 90 tablet 2   metroNIDAZOLE (METROGEL) 0.75 % gel Apply topically 2 (two) times daily.     Multiple Vitamin (MULTIVITAMIN) tablet Take 1 tablet by mouth daily.     nitroGLYCERIN (NITROSTAT) 0.4 MG SL tablet Place 1 tablet (0.4 mg total) under the tongue every 5 (five) minutes as needed for chest pain. 25 tablet 6   prednisoLONE acetate (PRED FORTE) 1 % ophthalmic suspension SMARTSIG:1 Drop(s) Left Eye Every 2 Hours     rosuvastatin (CRESTOR) 5 MG tablet TAKE 1 TABLET AT BEDTIME 90 tablet 3   triamcinolone ointment (KENALOG) 0.1 % Apply 1 application topically daily.      No current facility-administered medications for this visit.    Allergies:   Crestor [rosuvastatin], Atorvastatin, Peanut oil, and Trandolapril    Social History:  The patient  reports that he quit smoking about 32 years ago. His smoking use included pipe. He has quit using smokeless tobacco. He reports that he does not drink alcohol and does not use drugs.   Family History:  The patient's family history includes Coronary artery disease in his brother; Diabetes in his brother, mother, sister, and sister; Drug abuse in his brother; Heart failure (age of onset: 54) in his mother.    ROS:  Please see the history of present illness.  Patient denies fever, chills, headache, sweats, rash, change in vision, change in hearing, chest pain, cough, nausea or vomiting, urinary symptoms. All other systems are reviewed and are negative.     Physical Exam: Blood pressure 139/72, pulse (!) 56, height 5' 7.5" (1.715 m), weight 170 lb 6.4 oz (77.3 kg), SpO2 96%.     GEN:  Well nourished, well developed in no acute distress HEENT:  Normal NECK: No JVD; No carotid bruits LYMPHATICS: No lymphadenopathy CARDIAC: RRR , no murmurs, rubs, gallops RESPIRATORY:  Clear to auscultation without rales, wheezing or rhonchi  ABDOMEN: Soft, non-tender, non-distended MUSCULOSKELETAL:  No edema; No deformity  SKIN: Warm and dry NEUROLOGIC:  Alert and oriented x 3     EKG:     EKG Interpretation Date/Time:  Tuesday February 20 2024 15:38:21 EST Ventricular Rate:  56 PR Interval:  160 QRS Duration:  86 QT Interval:  408  QTC Calculation: 393 R Axis:   79  Text Interpretation: Sinus bradycardia When compared with ECG of 18-Dec-2020 13:39, No significant change was found Confirmed by Kristeen Miss [phone]) on 02/20/2024 4:06:10 PM    Recent Labs: 02/02/2024: ALT 15; BUN 27; Creatinine, Ser 1.92; Hemoglobin 13.8; Platelets 106.0; Potassium 4.6; Sodium 142    Lipid Panel    Component Value Date/Time   CHOL 114 02/02/2024 1047   CHOL 102 08/14/2023 0913   TRIG 160.0 (H) 02/02/2024 1047   HDL 44.80 02/02/2024 1047   HDL 39 (L) 08/14/2023 0913   CHOLHDL 3 02/02/2024 1047   VLDL 32.0 02/02/2024 1047   LDLCALC 37 02/02/2024 1047   LDLCALC 32 08/14/2023 0913   LDLDIRECT 58.0 08/01/2023 1041      Wt Readings from Last 3 Encounters:  02/20/24 170 lb 6.4 oz (77.3 kg)  02/02/24 171 lb (77.6 kg)  01/01/24 169 lb 12.8 oz (77 kg)      Current medicines are reviewed. The patient has a very good understanding of his medications. No changes are to be made.       ASSESSMENT AND PLAN:  1. CAD-     No angina .  Cont current meds     2. Essential HTN-   overall , well controlled.    3. Hyperlipidemia -    lipids are well controlled.  LDL is 37   .  4.  Chronic kidney disease:   .    Kristeen Miss, MD  02/20/2024 4:10 PM    [facility] [address] [place],  [address] [place], [place]  [address] Pager [phone] Phone: [phone]; Fax: [phone]

## 2024-02-20 ENCOUNTER — Ambulatory Visit: Payer: Medicare Other | Attending: Cardiovascular Disease | Admitting: Cardiovascular Disease

## 2024-02-20 ENCOUNTER — Encounter: Payer: Self-pay | Admitting: Cardiovascular Disease

## 2024-02-20 VITALS — BP 139/72 | HR 56 | Ht 67.5 in | Wt 170.4 lb

## 2024-02-20 DIAGNOSIS — E782 Mixed hyperlipidemia: Secondary | ICD-10-CM

## 2024-02-20 DIAGNOSIS — I10 Essential (primary) hypertension: Secondary | ICD-10-CM | POA: Diagnosis not present

## 2024-02-20 DIAGNOSIS — I2510 Atherosclerotic heart disease of native coronary artery without angina pectoris: Secondary | ICD-10-CM | POA: Diagnosis not present

## 2024-02-20 NOTE — Patient Instructions (Signed)

## 2024-03-23 ENCOUNTER — Other Ambulatory Visit: Payer: Self-pay | Admitting: Cardiovascular Disease

## 2024-04-01 ENCOUNTER — Ambulatory Visit: Payer: Medicare Other | Admitting: Podiatry

## 2024-04-08 ENCOUNTER — Ambulatory Visit (INDEPENDENT_AMBULATORY_CARE_PROVIDER_SITE_OTHER): Admitting: Podiatry

## 2024-04-08 ENCOUNTER — Encounter: Payer: Self-pay | Admitting: Podiatry

## 2024-04-08 DIAGNOSIS — E119 Type 2 diabetes mellitus without complications: Secondary | ICD-10-CM | POA: Diagnosis not present

## 2024-04-08 DIAGNOSIS — M79674 Pain in right toe(s): Secondary | ICD-10-CM | POA: Diagnosis not present

## 2024-04-08 DIAGNOSIS — M79675 Pain in left toe(s): Secondary | ICD-10-CM | POA: Diagnosis not present

## 2024-04-08 DIAGNOSIS — B351 Tinea unguium: Secondary | ICD-10-CM | POA: Diagnosis not present

## 2024-04-08 NOTE — Progress Notes (Signed)
This patient returns to my office for at risk foot care.  This patient requires this care by a professional since this patient will be at risk due to having thrombocytopenia, CKD, and diabetes.  This patient is unable to cut nails himself since the patient cannot reach his nails.These nails are painful walking and wearing shoes.  This patient presents for at risk foot care today. . Patient is taking plavix.  General Appearance  Alert, conversant and in no acute stress.  Vascular  Dorsalis pedis and posterior tibial  pulses are  weakly palpable  bilaterally.  Capillary return is within normal limits  bilaterally. Temperature is within normal limits  bilaterally.  Neurologic  Senn-Weinstein monofilament wire test within normal limits  bilaterally. Muscle power within normal limits bilaterally.  Nails Thick disfigured discolored nails with subungual debris  from hallux to fifth toes bilaterally. No evidence of bacterial infection or drainage bilaterally.  Orthopedic  No limitations of motion  feet .  No crepitus or effusions noted.  No bony pathology or digital deformities noted.  HAV  B/L.  Skin  normotropic skin with no porokeratosis noted bilaterally.  No signs of infections or ulcers noted.     Onychomycosis  Pain in right toes  Pain in left toes  Consent was obtained for treatment procedures.   Mechanical debridement of nails 1-5  bilaterally performed with a nail nipper.  Filed with dremel without incident.    Return office visit    3 months                  Told patient to return for periodic foot care and evaluation due to potential at risk complications.   Gardiner Barefoot DPM

## 2024-04-11 ENCOUNTER — Telehealth: Payer: Self-pay | Admitting: Cardiovascular Disease

## 2024-04-11 NOTE — Telephone Encounter (Signed)
   Pre-operative Risk Assessment    Patient Name: Collin [name]  DOB: 18-Apr-1925 MRN: [id number]   Date of last office visit: 02/20/24 Date of next office visit: due February 2026    Request for Surgical Clearance    Procedure:  Descemet Stripping Endothelial Keratoplasty (DSEK)  Date of Surgery:  Clearance 04/18/24                                Surgeon:  Dr. Lunda Salines  Surgeon's Group or Practice Name:  [facility]  Phone number:  [phone]  Fax number:  [phone]    Type of Clearance Requested:   - Medical  - Pharmacy:  Hold Clopidogrel (Plavix) 4-5 days    Type of Anesthesia:   she stated GMO, local.    Additional requests/questions:    Virgie Griffith   04/11/2024, 2:19 PM

## 2024-04-11 NOTE — Telephone Encounter (Signed)
 Dr. Alroy Aspen  We have received a surgical clearance request for Descemet Stripping Endothelial Keratoplasty (DSEK) . They were seen recently in clinic on 02/20/2024. Can you please comment on surgical clearance for his upcoming scheduled procedure on 04/18/2024. Please forward you guidance and recommendations to P CV DIV PREOP

## 2024-04-12 ENCOUNTER — Telehealth: Payer: Self-pay | Admitting: Cardiovascular Disease

## 2024-04-12 NOTE — Telephone Encounter (Signed)
   Patient Name: Collin [name]  DOB: 1925-08-20 MRN: [id number]  Primary Cardiologist: Ahmad Alert, MD  Chart reviewed as part of pre-operative protocol coverage. Given past medical history and time since last visit, based on ACC/AHA guidelines, DIMAS SCHECK is at acceptable risk for the planned procedure without further cardiovascular testing  Per Dr. Alroy Aspen He may hold his Plavix  for 4-5 days as requested. Restart plavix  as soon as it's safe from a surgical standpoint.  The patient was advised that if he develops new symptoms prior to surgery to contact our office to arrange for a follow-up visit, and he verbalized understanding.  I will route this recommendation to the requesting party via Epic fax function and remove from pre-op pool.  Please call with questions.  Francene Ing, Retha Cast, NP 04/12/2024, 7:37 AM

## 2024-04-12 NOTE — Telephone Encounter (Signed)
 Caller Collin [name]) returned Pre-op call regarding the type of anesthesia which will be used.  They will be using both GMO and local.

## 2024-04-12 NOTE — Telephone Encounter (Signed)
 Clearance request was noted

## 2024-07-12 ENCOUNTER — Ambulatory Visit (INDEPENDENT_AMBULATORY_CARE_PROVIDER_SITE_OTHER): Payer: Medicare Other

## 2024-07-12 VITALS — Ht 67.0 in | Wt 170.0 lb

## 2024-07-12 DIAGNOSIS — Z0000 Encounter for general adult medical examination without abnormal findings: Secondary | ICD-10-CM | POA: Diagnosis not present

## 2024-07-12 NOTE — Progress Notes (Signed)
 Subjective:  Please attest and cosign this visit due to patients primary care provider not being in the office at the time the visit was completed.  (Pt of Dr Glade Hope)   Collin [name] is a 89 y.o. who presents for a Medicare Wellness preventive visit.  As a reminder, Annual Wellness Visits don't include a physical exam, and some assessments may be limited, especially if this visit is performed virtually. We may recommend an in-person follow-up visit with your provider if needed.  Visit Complete: Virtual I connected with  Collin [name] on 07/12/24 by a audio enabled telemedicine application and verified that I am speaking with the correct person using two identifiers.  Patient Location: Home  Provider Location: Office/Clinic  I discussed the limitations of evaluation and management by telemedicine. The patient expressed understanding and agreed to proceed.  Vital Signs: Because this visit was a virtual/telehealth visit, some criteria may be missing or patient reported. Any vitals not documented were not able to be obtained and vitals that have been documented are patient reported.  VideoDeclined- This patient declined Librarian, academic. Therefore the visit was completed with audio only.  Persons Participating in Visit: Patient.  AWV Questionnaire: No: Patient Medicare AWV questionnaire was not completed prior to this visit.  Cardiac Risk Factors include: advanced age (>26men, >5 women);diabetes mellitus;dyslipidemia;male gender;hypertension     Objective:    Today's Vitals   07/12/24 1416  Weight: 170 lb (77.1 kg)  Height: 5' 7 (1.702 m)   Body mass index is 26.63 kg/m.     07/12/2024    2:16 PM 07/07/2023    2:16 PM 12/03/2021    1:49 PM 10/15/2020    1:48 PM 11/27/2019    9:42 AM 10/14/2019    2:49 PM 10/08/2018    1:52 PM  Advanced Directives  Does Patient Have a Medical Advance Directive? Yes Yes Yes Yes Yes Yes Yes   Type of  Estate agent of [place];Living will Healthcare Power of [place];Living will Living will;Healthcare Power of [employer] Power of [place];Living will Living will;Healthcare Power of [employer] Power of [place];Living will Healthcare Power of [place];Living will  Does patient want to make changes to medical advance directive?   No - Patient declined No - Patient declined No - Patient declined    Copy of Healthcare Power of Attorney in Chart? No - copy requested No - copy requested No - copy requested No - copy requested Yes - validated most recent copy scanned in chart (See row information) No - copy requested No - copy requested      Data saved with a previous flowsheet row definition    Current Medications (verified) Outpatient Encounter Medications as of 07/12/2024  Medication Sig   acetaminophen  (TYLENOL ) 500 MG tablet Take 500 mg by mouth every 6 (six) hours as needed for moderate pain or headache.    alfuzosin  (UROXATRAL ) 10 MG 24 hr tablet Take 10 mg by mouth daily.    aspirin  EC 81 MG tablet Take 1 tablet (81 mg total) by mouth daily.   carvedilol  (COREG ) 3.125 MG tablet TAKE 1 TABLET TWICE A DAY   clopidogrel  (PLAVIX ) 75 MG tablet TAKE 1 TABLET DAILY   fenofibrate  (TRICOR ) 48 MG tablet TAKE 1 TABLET DAILY   finasteride  (PROSCAR ) 5 MG tablet Take 5 mg by mouth daily.   fluticasone  (FLONASE ) 50 MCG/ACT nasal spray Place 1 spray into both nostrils daily.   losartan  (COZAAR ) 50 MG tablet  TAKE 1/2 TABLET IN THE     MORNING AND AT BEDTIME   metroNIDAZOLE (METROGEL) 0.75 % gel Apply topically 2 (two) times daily.   Multiple Vitamin (MULTIVITAMIN) tablet Take 1 tablet by mouth daily.   nitroGLYCERIN  (NITROSTAT ) 0.4 MG SL tablet Place 1 tablet (0.4 mg total) under the tongue every 5 (five) minutes as needed for chest pain.   prednisoLONE acetate (PRED FORTE) 1 % ophthalmic suspension SMARTSIG:1 Drop(s) Left Eye Every 2 Hours   rosuvastatin  (CRESTOR ) 5  MG tablet TAKE 1 TABLET AT BEDTIME   triamcinolone  ointment (KENALOG ) 0.1 % Apply 1 application topically daily.    No facility-administered encounter medications on file as of 07/12/2024.    Allergies (verified) Crestor  [rosuvastatin ], Atorvastatin , Peanut oil, and Trandolapril   History: Past Medical History:  Diagnosis Date   Allergy    Anemia    Arthritis    gout   Bladder cancer (HCC)    BPH (benign prostatic hyperplasia)    CAD (coronary artery disease) 2005   a. diaphragmatic MI s/p DES to RCA 2005, residual disease. b. [place]  11/2019 s/p DES to PDA, mRCA, mid Cx.   Cancer (HCC) 02/28/2001   Left ureter cancer/bladder   Carcinoma, renal cell (HCC)    Cardiomyopathy (HCC)    a. EF 45-50% by echo 12/2018.   Carotid artery disease (HCC)    Doppler, September, 2010, normal, no carotid artery disease   CKD (chronic kidney disease), stage III (HCC)    COPD (chronic obstructive pulmonary disease) (HCC)    Dysfunction of eustachian tube    Ejection fraction    EF 55%, echo, January, 2009   Encounter for long-term (current) use of other medications    Enlarged prostate    GERD (gastroesophageal reflux disease)    Gynecomastia    b/l   HTN (hypertension)    Hx of cystoscopy 11/02/12   negative   Hx of skin cancer, basal cell    Hypercholesterolemia    Hyperlipidemia    Low back pain syndrome    Myocardial infarction (HCC)    hx acute   Nocturia    Organic impotence    Pulmonary nodule    Chest CT, followed   Reactive airway disease    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   Staph infection 04/1997   Thrombocytopenia [facility])    Past Surgical History:  Procedure Laterality Date   BACK SURGERY  02/1996   bladder tumors  2003, 2004, 01/2009   removed   CARDIAC CATHETERIZATION     CHOLECYSTECTOMY  07/2003   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   sutured, right   CORNEAL TRANSPLANT  10/2008   left   CORNEAL TRANSPLANT  2025   CORONARY STENT INTERVENTION   11/28/2019   CORONARY STENT INTERVENTION N/A 11/28/2019   Procedure: CORONARY STENT INTERVENTION;  Surgeon: Verlin Lonni BIRCH, MD;  Location: [facility] INVASIVE CV LAB;  Service: Cardiovascular;  Laterality: N/A;   LEFT HEART CATH AND CORONARY ANGIOGRAPHY N/A 11/27/2019   Procedure: LEFT HEART CATH AND CORONARY ANGIOGRAPHY;  Surgeon: Verlin Lonni BIRCH, MD;  Location: [facility] INVASIVE CV LAB;  Service: Cardiovascular;  Laterality: N/A;   NEPHRECTOMY  02/2001   left kidney, ureter   osteomyelitis     staph aureus   URETERECTOMY  2003   for cancer   Family History  Problem Relation Age of Onset   Heart failure Mother 69   Diabetes Mother    Coronary artery disease Brother  Diabetes Brother    Drug abuse Brother    Diabetes Sister    Diabetes Sister    Social History   Socioeconomic History   Marital status: Widowed    Spouse name: Not on file   Number of children: 1   Years of education: Not on file   Highest education level: Some college, no degree  Occupational History   Occupation: 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  Tobacco Use   Smoking status: Former    Types: Pipe    Quit date: 12/27/1991    Years since quitting: 32.5   Smokeless tobacco: Former   Tobacco comments:    Quit 20-30 years ago as of 2012  Vaping Use   Vaping status: Never Used  Substance and Sexual Activity   Alcohol use: No    Alcohol/week: 0.0 standard drinks of alcohol   Drug use: No   Sexual activity: Not Currently  Other Topics Concern   Not on file  Social History Narrative   30 minutes a day for 5 days a week   Social Drivers of Corporate investment banker Strain: Low Risk  (07/12/2024)   Overall Financial Resource Strain (CARDIA)    Difficulty of Paying Living Expenses: Not hard at all  Food Insecurity: No Food Insecurity (07/12/2024)   Hunger Vital Sign    Worried About Running Out of Food in the Last Year: Never true    Ran Out of Food in the Last Year: Never true  Transportation  Needs: No Transportation Needs (07/12/2024)   PRAPARE - Administrator, Civil Service (Medical): No    Lack of Transportation (Non-Medical): No  Physical Activity: Insufficiently Active (07/12/2024)   Exercise Vital Sign    Days of Exercise per Week: 3 days    Minutes of Exercise per Session: 30 min  Stress: No Stress Concern Present (07/12/2024)   [employer] of Occupational Health - Occupational Stress Questionnaire    Feeling of Stress: Not at all  Social Connections: Moderately Integrated (07/12/2024)   Social Connection and Isolation Panel    Frequency of Communication with Friends and Family: Three times a week    Frequency of Social Gatherings with Friends and Family: Twice a week    Attends Religious Services: More than 4 times per year    Active Member of [employer] or Organizations: Yes    Attends Banker Meetings: More than 4 times per year    Marital Status: Widowed    Tobacco Counseling Counseling given: Not Answered Tobacco comments: Quit 20-30 years ago as of 2012    Clinical Intake:  Pre-visit preparation completed: Yes  Pain : No/denies pain     BMI - recorded: 26.63 Nutritional Status: BMI 25 -29 Overweight Nutritional Risks: None Diabetes: No  Lab Results  Component Value Date   HGBA1C 6.5 02/02/2024   HGBA1C 6.5 08/01/2023   HGBA1C 6.4 01/30/2023     How often do you need to have someone help you when you read instructions, pamphlets, or other written materials from your doctor or pharmacy?: 1 - Never  Interpreter Needed?: No  Information entered by :: Verdie Saba, CMA   Activities of Daily Living     07/12/2024    2:19 PM  In your present state of health, do you have any difficulty performing the following activities:  Hearing? 0  Vision? 0  Difficulty concentrating or making decisions? 0  Walking or climbing stairs? 0  Dressing or bathing?  0  Doing errands, shopping? 0  Preparing Food and eating ? N  Using  the Toilet? N  In the past six months, have you accidently leaked urine? N  Do you have problems with loss of bowel control? N  Managing your Medications? N  Managing your Finances? N  Housekeeping or managing your Housekeeping? N    Patient Care Team: Geofm Glade PARAS, MD as PCP - General (Internal Medicine) O'Neal, Darryle Ned, MD as Consulting Physician (Cardiology) Caresse Cough, MD as Referring Physician (Ophthalmology)  I have updated your Care Teams any recent Medical Services you may have received from other providers in the past year.     Assessment:   This is a routine wellness examination for Collin [name].  Hearing/Vision screen Hearing Screening - Comments:: Wears hearing aids Vision Screening - Comments:: Wears rx glasses - up to date with routine eye exams with Dr Mamie   Goals Addressed               This Visit's Progress     Patient Stated (pt-stated)        Patient stated he plans to continue bowling once a week and exercising (treadmill)       Depression Screen     07/12/2024    2:21 PM 02/02/2024   10:10 AM 08/01/2023   10:11 AM 07/07/2023    2:20 PM 01/30/2023   10:10 AM 12/03/2021    2:04 PM 11/16/2021    2:00 PM  PHQ 2/9 Scores  PHQ - 2 Score 0 0 0 0 0 0 0  PHQ- 9 Score 0  0 3 2      Fall Risk     07/12/2024    2:20 PM 02/02/2024   10:10 AM 08/01/2023   10:10 AM 07/07/2023    2:17 PM 06/30/2023    9:19 AM  Fall Risk   Falls in the past year? 0 0 0 0 0  Number falls in past yr: 0 0 0 0 0  Injury with Fall? 0 0 0 0   Risk for fall due to : No Fall Risks No Fall Risks No Fall Risks No Fall Risks   Follow up Falls evaluation completed;Falls prevention discussed Falls evaluation completed Falls evaluation completed Falls prevention discussed     MEDICARE RISK AT HOME:  Medicare Risk at Home Any stairs in or around the home?: Yes (outside) If so, are there any without handrails?: No Home free of loose throw rugs in walkways, pet beds,  electrical cords, etc?: Yes Adequate lighting in your home to reduce risk of falls?: Yes Life alert?: No Use of a cane, walker or w/c?: No Grab bars in the bathroom?: Yes Shower chair or bench in shower?: No Elevated toilet seat or a handicapped toilet?: No  TIMED UP AND GO:  Was the test performed?  No  Cognitive Function: 6CIT completed    10/08/2018    1:19 PM 10/04/2017    2:36 PM 04/06/2016    3:09 PM  MMSE - Mini Mental State Exam  Not completed:   --  Orientation to time 5 5    Orientation to Place 5 5    Registration 3 3    Attention/ Calculation 5 5    Recall 3 3    Language- name 2 objects 2 2    Language- repeat 1 1   Language- follow 3 step command 3 3    Language- read & follow direction 1 1  Write a sentence 1 1    Copy design 1 1    Total score 30 30       Data saved with a previous flowsheet row definition        07/12/2024    2:22 PM 07/07/2023    2:17 PM 10/14/2019    2:49 PM  6CIT Screen  What Year? 0 points 0 points 0 points  What month? 0 points 0 points 0 points  What time? 0 points 0 points 0 points  Count back from 20 0 points 0 points 0 points  Months in reverse 0 points 2 points 0 points  Repeat phrase 0 points 0 points 0 points  Total Score 0 points 2 points 0 points    Immunizations Immunization History  Administered Date(s) Administered   Influenza Whole 09/17/2008, 07/26/2012, 09/10/2013   Influenza, High Dose Seasonal PF 08/29/2014, 10/07/2016, 08/04/2018, 08/15/2019   Influenza-Unspecified 08/31/2015, 09/21/2017, 10/22/2021   Moderna Covid-19 Vaccine Bivalent Booster 1yrs & up 10/22/2021   Moderna SARS-COV2 Booster Vaccination 10/24/2020   PFIZER Comirnaty (Gray Top)Covid-19 Tri-Sucrose Vaccine 12/30/2022   PFIZER(Purple Top)SARS-COV-2 Vaccination 01/07/2020, 02/04/2020, 10/24/2020, 05/28/2021   PNEUMOCOCCAL CONJUGATE-20 01/24/2022   Pneumococcal Conjugate-13 11/07/2016   Pneumococcal-Unspecified 12/26/1996   Tdap  07/17/2017   Zoster Recombinant(Shingrix) 10/06/2017, 12/11/2017    Screening Tests Health Maintenance  Topic Date Due   COVID-19 Vaccine (6 - 2024-25 season) 08/27/2023   INFLUENZA VACCINE  07/26/2024   HEMOGLOBIN A1C  08/01/2024   FOOT EXAM  12/31/2024   OPHTHALMOLOGY EXAM  07/10/2025   Medicare Annual Wellness (AWV)  07/12/2025   DTaP/Tdap/Td (2 - Td or Tdap) 07/18/2027   Pneumococcal Vaccine: 50+ Years  Completed   Zoster Vaccines- Shingrix  Completed   Hepatitis B Vaccines  Aged Out   HPV VACCINES  Aged Out   Meningococcal B Vaccine  Aged Out    Health Maintenance  Health Maintenance Due  Topic Date Due   COVID-19 Vaccine (6 - 2024-25 season) 08/27/2023   Health Maintenance Items Addressed:07/12/2024   Additional Screening:  Vision Screening: Recommended annual ophthalmology exams for early detection of glaucoma and other disorders of the eye. Would you like a referral to an eye doctor? No  Patient stated he has had an eye exam with Dr Caresse on 07/10/2024.  Dental Screening: Recommended annual dental exams for proper oral hygiene  Community Resource Referral / Chronic Care Management: CRR required this visit?  No   CCM required this visit?  No   Plan:    I have personally reviewed and noted the following in the patient's chart:   Medical and social history Use of alcohol, tobacco or illicit drugs  Current medications and supplements including opioid prescriptions. Patient is not currently taking opioid prescriptions. Functional ability and status Nutritional status Physical activity Advanced directives List of other physicians Hospitalizations, surgeries, and ER visits in previous 12 months Vitals Screenings to include cognitive, depression, and falls Referrals and appointments  In addition, I have reviewed and discussed with patient certain preventive protocols, quality metrics, and best practice recommendations. A written personalized care plan  for preventive services as well as general preventive health recommendations were provided to patient.   Verdie CHRISTELLA Saba, CMA   07/12/2024   After Visit Summary: (MyChart) Due to this being a telephonic visit, the after visit summary with patients personalized plan was offered to patient via MyChart   Notes: Nothing significant to report at this time.

## 2024-07-12 NOTE — Patient Instructions (Addendum)
 Collin [name] , Thank you for taking time out of your busy schedule to complete your Annual Wellness Visit with me. I enjoyed our conversation and look forward to speaking with you again next year. I, as well as your care team,  appreciate your ongoing commitment to your health goals. Please review the following plan we discussed and let me know if I can assist you in the future. Your Game plan/ To Do List   Follow up Visits: Next Medicare AWV with our clinical staff: 07/18/2024   Have you seen your provider in the last 6 months (3 months if uncontrolled diabetes)? No Next Office Visit with your provider: 08/05/2024  Clinician Recommendations:  Aim for 30 minutes of exercise or brisk walking, 6-8 glasses of water, and 5 servings of fruits and vegetables each day.       This is a list of the screening recommended for you and due dates:  Health Maintenance  Topic Date Due   COVID-19 Vaccine (6 - 2024-25 season) 08/27/2023   Flu Shot  07/26/2024   Hemoglobin A1C  08/01/2024   Complete foot exam   12/31/2024   Eye exam for diabetics  07/10/2025   Medicare Annual Wellness Visit  07/12/2025   DTaP/Tdap/Td vaccine (2 - Td or Tdap) 07/18/2027   Pneumococcal Vaccine for age over 11  Completed   Zoster (Shingles) Vaccine  Completed   Hepatitis B Vaccine  Aged Out   HPV Vaccine  Aged Out   Meningitis B Vaccine  Aged Out    Advanced directives: (Copy Requested) Please bring a copy of your health care power of attorney and living will to the office to be added to your chart at your convenience. You can mail to [facility] [address] [place], [place] [address] or email to [email or web address] Advance Care Planning is important because it:  [x]  Makes sure you receive the medical care that is consistent with your values, goals, and preferences  [x]  It provides guidance to your family and loved ones and reduces their decisional burden about whether or not they are making the  right decisions based on your wishes.  Follow the link provided in your after visit summary or read over the paperwork we have mailed to you to help you started getting your Advance Directives in place. If you need assistance in completing these, please reach out to us  so that we can help you!

## 2024-08-02 ENCOUNTER — Ambulatory Visit: Payer: Medicare Other | Admitting: Internal Medicine

## 2024-08-04 NOTE — Progress Notes (Signed)
      Subjective:    Patient ID: Collin [name], male    DOB: 1925/09/15, 89 y.o.   MRN: [id number]     HPI Omarie is here for follow up of his chronic medical problems.    Medications and allergies reviewed with patient and updated if appropriate.  Current Outpatient Medications on File Prior to Visit  Medication Sig Dispense Refill  . acetaminophen  (TYLENOL ) 500 MG tablet Take 500 mg by mouth every 6 (six) hours as needed for moderate pain or headache.     . alfuzosin  (UROXATRAL ) 10 MG 24 hr tablet Take 10 mg by mouth daily.     . aspirin  EC 81 MG tablet Take 1 tablet (81 mg total) by mouth daily.    . carvedilol  (COREG ) 3.125 MG tablet TAKE 1 TABLET TWICE A DAY 180 tablet 3  . clopidogrel  (PLAVIX ) 75 MG tablet TAKE 1 TABLET DAILY 90 tablet 2  . fenofibrate  (TRICOR ) 48 MG tablet TAKE 1 TABLET DAILY 90 tablet 2  . finasteride  (PROSCAR ) 5 MG tablet Take 5 mg by mouth daily.    . fluticasone  (FLONASE ) 50 MCG/ACT nasal spray Place 1 spray into both nostrils daily. 9.9 mL 2  . losartan  (COZAAR ) 50 MG tablet TAKE 1/2 TABLET IN THE     MORNING AND AT BEDTIME 90 tablet 2  . Multiple Vitamin (MULTIVITAMIN) tablet Take 1 tablet by mouth daily.    . nitroGLYCERIN  (NITROSTAT ) 0.4 MG SL tablet Place 1 tablet (0.4 mg total) under the tongue every 5 (five) minutes as needed for chest pain. 25 tablet 6  . prednisoLONE acetate (PRED FORTE) 1 % ophthalmic suspension SMARTSIG:1 Drop(s) Left Eye Every 2 Hours    . rosuvastatin  (CRESTOR ) 5 MG tablet TAKE 1 TABLET AT BEDTIME 90 tablet 3  . triamcinolone  ointment (KENALOG ) 0.1 % Apply 1 application topically daily.      No current facility-administered medications on file prior to visit.     Review of Systems     Objective:  There were no vitals filed for this visit. BP Readings from Last 3 Encounters:  08/09/24 122/60  02/20/24 139/72  02/09/24 132/77   Wt Readings from Last 3 Encounters:  08/09/24 167 lb (75.8 kg)  07/12/24 170 lb (77.1  kg)  02/20/24 170 lb 6.4 oz (77.3 kg)   There is no height or weight on file to calculate BMI.    Physical Exam     Lab Results  Component Value Date   WBC 3.7 (L) 08/09/2024   HGB 12.5 (L) 08/09/2024   HCT 37.7 (L) 08/09/2024   PLT 106.0 (L) 08/09/2024   GLUCOSE 107 (H) 08/09/2024   CHOL 128 08/09/2024   TRIG 153.0 (H) 08/09/2024   HDL 42.30 08/09/2024   LDLDIRECT 58.0 08/01/2023   LDLCALC 55 08/09/2024   ALT 13 08/09/2024   AST 18 08/09/2024   NA 140 08/09/2024   K 5.0 08/09/2024   CL 103 08/09/2024   CREATININE 1.87 (H) 08/09/2024   BUN 31 (H) 08/09/2024   CO2 29 08/09/2024   TSH 3.40 07/08/2019   INR 1.04 11/04/2009   HGBA1C 6.7 (H) 08/09/2024     Assessment & Plan:    See Problem List for Assessment and Plan of chronic medical problems.    This encounter was created in error - please disregard.

## 2024-08-04 NOTE — Patient Instructions (Addendum)
      Blood work was ordered.       Medications changes include :   None    A referral was ordered and someone will call you to schedule an appointment.     Return in about 6 months (around 02/05/2025) for follow up.

## 2024-08-05 ENCOUNTER — Encounter: Admitting: Internal Medicine

## 2024-08-05 DIAGNOSIS — E785 Hyperlipidemia, unspecified: Secondary | ICD-10-CM

## 2024-08-05 DIAGNOSIS — N1831 Chronic kidney disease, stage 3a: Secondary | ICD-10-CM

## 2024-08-05 DIAGNOSIS — I10 Essential (primary) hypertension: Secondary | ICD-10-CM

## 2024-08-05 DIAGNOSIS — N1832 Chronic kidney disease, stage 3b: Secondary | ICD-10-CM

## 2024-08-05 DIAGNOSIS — D696 Thrombocytopenia, unspecified: Secondary | ICD-10-CM

## 2024-08-05 DIAGNOSIS — I2510 Atherosclerotic heart disease of native coronary artery without angina pectoris: Secondary | ICD-10-CM

## 2024-08-08 ENCOUNTER — Encounter: Payer: Self-pay | Admitting: Internal Medicine

## 2024-08-08 NOTE — Progress Notes (Signed)
 Subjective:    Patient ID: Collin [name], male    DOB: 04/09/1925, 89 y.o.   MRN: [id number]     HPI Collin [name] is here for follow up of his chronic medical problems.  Eating more eggs and meat.    Ears are clogged and hearing is worse - not wearing hearing aide - does not help.  He was not always wearing his hearing aid to begin with because it was very noisy.  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  Current Outpatient Medications on File Prior to Visit  Medication Sig Dispense Refill   acetaminophen  (TYLENOL ) 500 MG tablet Take 500 mg by mouth every 6 (six) hours as needed for moderate pain or headache.      alfuzosin  (UROXATRAL ) 10 MG 24 hr tablet Take 10 mg by mouth daily.      aspirin  EC 81 MG tablet Take 1 tablet (81 mg total) by mouth daily.     carvedilol  (COREG ) 3.125 MG tablet TAKE 1 TABLET TWICE A DAY 180 tablet 3   clopidogrel  (PLAVIX ) 75 MG tablet TAKE 1 TABLET DAILY 90 tablet 2   fenofibrate  (TRICOR ) 48 MG tablet TAKE 1 TABLET DAILY 90 tablet 2   finasteride  (PROSCAR ) 5 MG tablet Take 5 mg by mouth daily.     fluticasone  (FLONASE ) 50 MCG/ACT nasal spray Place 1 spray into both nostrils daily. 9.9 mL 2   losartan  (COZAAR ) 50 MG tablet TAKE 1/2 TABLET IN THE     MORNING AND AT BEDTIME 90 tablet 2   Multiple Vitamin (MULTIVITAMIN) tablet Take 1 tablet by mouth daily.     nitroGLYCERIN  (NITROSTAT ) 0.4 MG SL tablet Place 1 tablet (0.4 mg total) under the tongue every 5 (five) minutes as needed for chest pain. 25 tablet 6   prednisoLONE acetate (PRED FORTE) 1 % ophthalmic suspension SMARTSIG:1 Drop(s) Left Eye Every 2 Hours     rosuvastatin  (CRESTOR ) 5 MG tablet TAKE 1 TABLET AT BEDTIME 90 tablet 3   triamcinolone  ointment (KENALOG ) 0.1 % Apply 1 application topically daily.      No current facility-administered medications on file prior to visit.     Review of Systems   Constitutional:  Negative for appetite change and fever.  HENT:  Positive for hearing loss.        Ears feel clogged  Respiratory:  Positive for shortness of breath (chronic - not worse). Negative for cough and wheezing.   Cardiovascular:  Negative for chest pain, palpitations and leg swelling.  Neurological:  Negative for dizziness, light-headedness and headaches.       Objective:   Vitals:   08/09/24 1447  BP: 122/60  Pulse: (!) 53  Temp: 98.1 F (36.7 C)  SpO2: 94%   BP Readings from Last 3 Encounters:  08/09/24 122/60  02/20/24 139/72  02/09/24 132/77   Wt Readings from Last 3 Encounters:  08/09/24 167 lb (75.8 kg)  07/12/24 170 lb (77.1 kg)  02/20/24 170 lb 6.4 oz (77.3 kg)   Body mass index is 26.16 kg/m.    Physical Exam Constitutional:      General: He is not in acute distress.    Appearance: Normal appearance. He is not ill-appearing.  HENT:     Head: Normocephalic and atraumatic.     Right Ear: External ear normal. There is impacted cerumen.     Left Ear: External ear normal. There  is impacted cerumen.  Eyes:     Conjunctiva/sclera: Conjunctivae normal.  Cardiovascular:     Rate and Rhythm: Normal rate and regular rhythm.     Heart sounds: Normal heart sounds.  Pulmonary:     Effort: Pulmonary effort is normal. No respiratory distress.     Breath sounds: Normal breath sounds. No wheezing or rales.  Musculoskeletal:     Right lower leg: No edema.     Left lower leg: No edema.  Skin:    General: Skin is warm and dry.     Findings: No rash.  Neurological:     Mental Status: He is alert. Mental status is at baseline.  Psychiatric:        Mood and Affect: Mood normal.        Lab Results  Component Value Date   WBC 3.4 (L) 02/02/2024   HGB 13.8 02/02/2024   HCT 41.6 02/02/2024   PLT 106.0 (L) 02/02/2024   GLUCOSE 96 02/02/2024   CHOL 114 02/02/2024   TRIG 160.0 (H) 02/02/2024   HDL 44.80 02/02/2024   LDLDIRECT 58.0 08/01/2023   LDLCALC  37 02/02/2024   ALT 15 02/02/2024   AST 18 02/02/2024   NA 142 02/02/2024   K 4.6 02/02/2024   CL 104 02/02/2024   CREATININE 1.92 (H) 02/02/2024   BUN 27 (H) 02/02/2024   CO2 27 02/02/2024   TSH 3.40 07/08/2019   INR 1.04 11/04/2009   HGBA1C 6.5 02/02/2024     Assessment & Plan:    See Problem List for Assessment and Plan of chronic medical problems.

## 2024-08-08 NOTE — Patient Instructions (Addendum)
      Blood work was ordered.       Medications changes include :   None    A referral was ordered for ENT for your ears and someone will call you to schedule an appointment.     Return in about 6 months (around 02/09/2025) for Physical Exam.

## 2024-08-09 ENCOUNTER — Ambulatory Visit (INDEPENDENT_AMBULATORY_CARE_PROVIDER_SITE_OTHER): Admitting: Internal Medicine

## 2024-08-09 VITALS — BP 122/60 | HR 53 | Temp 98.1°F | Ht 67.0 in | Wt 167.0 lb

## 2024-08-09 DIAGNOSIS — Z859 Personal history of malignant neoplasm, unspecified: Secondary | ICD-10-CM

## 2024-08-09 DIAGNOSIS — E1159 Type 2 diabetes mellitus with other circulatory complications: Secondary | ICD-10-CM

## 2024-08-09 DIAGNOSIS — I152 Hypertension secondary to endocrine disorders: Secondary | ICD-10-CM

## 2024-08-09 DIAGNOSIS — I2510 Atherosclerotic heart disease of native coronary artery without angina pectoris: Secondary | ICD-10-CM | POA: Diagnosis not present

## 2024-08-09 DIAGNOSIS — E1122 Type 2 diabetes mellitus with diabetic chronic kidney disease: Secondary | ICD-10-CM

## 2024-08-09 DIAGNOSIS — Z8551 Personal history of malignant neoplasm of bladder: Secondary | ICD-10-CM

## 2024-08-09 DIAGNOSIS — H918X3 Other specified hearing loss, bilateral: Secondary | ICD-10-CM

## 2024-08-09 DIAGNOSIS — H6123 Impacted cerumen, bilateral: Secondary | ICD-10-CM

## 2024-08-09 DIAGNOSIS — E785 Hyperlipidemia, unspecified: Secondary | ICD-10-CM | POA: Diagnosis not present

## 2024-08-09 DIAGNOSIS — E1169 Type 2 diabetes mellitus with other specified complication: Secondary | ICD-10-CM

## 2024-08-09 DIAGNOSIS — N1832 Chronic kidney disease, stage 3b: Secondary | ICD-10-CM | POA: Diagnosis not present

## 2024-08-09 DIAGNOSIS — Z85528 Personal history of other malignant neoplasm of kidney: Secondary | ICD-10-CM

## 2024-08-09 DIAGNOSIS — N1831 Chronic kidney disease, stage 3a: Secondary | ICD-10-CM

## 2024-08-09 DIAGNOSIS — J439 Emphysema, unspecified: Secondary | ICD-10-CM

## 2024-08-09 DIAGNOSIS — H18519 Endothelial corneal dystrophy, unspecified eye: Secondary | ICD-10-CM

## 2024-08-09 LAB — CBC WITH DIFFERENTIAL/PLATELET
Basophils Absolute: 0 K/uL (ref 0.0–0.1)
Basophils Relative: 0.8 % (ref 0.0–3.0)
Eosinophils Absolute: 0.2 K/uL (ref 0.0–0.7)
Eosinophils Relative: 4.8 % (ref 0.0–5.0)
HCT: 37.7 % — ABNORMAL LOW (ref 39.0–52.0)
Hemoglobin: 12.5 g/dL — ABNORMAL LOW (ref 13.0–17.0)
Lymphocytes Relative: 29.1 % (ref 12.0–46.0)
Lymphs Abs: 1.1 K/uL (ref 0.7–4.0)
MCHC: 33.3 g/dL (ref 30.0–36.0)
MCV: 94.4 fl (ref 78.0–100.0)
Monocytes Absolute: 0.4 K/uL (ref 0.1–1.0)
Monocytes Relative: 11.6 % (ref 3.0–12.0)
Neutro Abs: 2 K/uL (ref 1.4–7.7)
Neutrophils Relative %: 53.7 % (ref 43.0–77.0)
Platelets: 106 K/uL — ABNORMAL LOW (ref 150.0–400.0)
RBC: 3.99 Mil/uL — ABNORMAL LOW (ref 4.22–5.81)
RDW: 13.3 % (ref 11.5–15.5)
WBC: 3.7 K/uL — ABNORMAL LOW (ref 4.0–10.5)

## 2024-08-09 LAB — COMPREHENSIVE METABOLIC PANEL WITH GFR
ALT: 13 U/L (ref 0–53)
AST: 18 U/L (ref 0–37)
Albumin: 4.4 g/dL (ref 3.5–5.2)
Alkaline Phosphatase: 41 U/L (ref 39–117)
BUN: 31 mg/dL — ABNORMAL HIGH (ref 6–23)
CO2: 29 meq/L (ref 19–32)
Calcium: 9.4 mg/dL (ref 8.4–10.5)
Chloride: 103 meq/L (ref 96–112)
Creatinine, Ser: 1.87 mg/dL — ABNORMAL HIGH (ref 0.40–1.50)
GFR: 29.84 mL/min — ABNORMAL LOW (ref >60.00)
Glucose, Bld: 107 mg/dL — ABNORMAL HIGH (ref 70–99)
Potassium: 5 meq/L (ref 3.5–5.1)
Sodium: 140 meq/L (ref 135–145)
Total Bilirubin: 0.8 mg/dL (ref 0.2–1.2)
Total Protein: 6.4 g/dL (ref 6.0–8.3)

## 2024-08-09 LAB — LIPID PANEL
Cholesterol: 128 mg/dL (ref 0–200)
HDL: 42.3 mg/dL (ref >39.00)
LDL Cholesterol: 55 mg/dL (ref 0–99)
NonHDL: 85.37
Total CHOL/HDL Ratio: 3
Triglycerides: 153 mg/dL — ABNORMAL HIGH (ref 0.0–149.0)
VLDL: 30.6 mg/dL (ref 0.0–40.0)

## 2024-08-09 LAB — HEMOGLOBIN A1C: Hgb A1c MFr Bld: 6.7 % — ABNORMAL HIGH (ref 4.6–6.5)

## 2024-08-09 NOTE — Assessment & Plan Note (Signed)
 Chronic Following with urology  No evidence of recurrence

## 2024-08-09 NOTE — Assessment & Plan Note (Signed)
Chronic Blood pressure well controlled CMP, CBC Continue Coreg 3.125 mg twice daily, losartan 25 mg twice daily

## 2024-08-09 NOTE — Assessment & Plan Note (Signed)
Chronic Check lipid panel  Continue Crestor 5 mg daily, fenofibrate 48 mg daily

## 2024-08-09 NOTE — Assessment & Plan Note (Signed)
 Chronic Not sure of how he was officially diagnosed He does have some chronic shortness of breath which has not worsened No need for treatment at this time Will just monitor

## 2024-08-09 NOTE — Assessment & Plan Note (Addendum)
 New Has hearing loss and wears hearing aides - not able to wear them Has clogged sensation in ears Will clean out ears today-see CMA procedure note Refer to ENT - hearing aids not working probably and concern over clogged sensation which will hopefully improve after ear lavage

## 2024-08-09 NOTE — Assessment & Plan Note (Signed)
 Chronic  Lab Results  Component Value Date   HGBA1C 6.5 02/02/2024   Sugars controlled Check A1c Continue lifestyle control

## 2024-08-09 NOTE — Assessment & Plan Note (Signed)
 Chronic S/p cornea tranplant x 2 - first one was not successful - most recent time was successful

## 2024-08-09 NOTE — Progress Notes (Signed)
 PRE-PROCEDURE EXAM: Bilateral TM's cannot be visualized due to total occlusion/impaction of the ear canal.  PROCEDURE INDICATION: remove wax to visualize ear drum & relieve discomfort  CONSENT:  Verbal    PROCEDURE NOTE:   RIGHT EAR:  The CMA irrigated the ear canal with warm water to remove the wax.   LEFT EAR:  The CMA irrigated the ear canal with warm water to remove the wax.   POST- PROCEDURE EXAM: B/l TMs successfully visualized and found to have no erythema.  Right ear canal with scant remaining cerumen.  Left ear canal with no remaining cerumen.    Patient tolerated the procedure well and had relief of their symptoms after the procedure.

## 2024-08-09 NOTE — Assessment & Plan Note (Signed)
 Chronic Stable CMP, CBC

## 2024-08-09 NOTE — Assessment & Plan Note (Signed)
 Chronic Follows with cardiology Continue Coreg  3.125 mg twice daily, losartan  25 mg twice daily, Plavix  75 mg daily, fenofibrate  48 mg daily, Crestor  5 mg daily Lipid panel, CBC, CMP

## 2024-08-10 ENCOUNTER — Ambulatory Visit: Payer: Self-pay | Admitting: Internal Medicine

## 2024-08-16 ENCOUNTER — Other Ambulatory Visit: Payer: Self-pay

## 2024-08-19 ENCOUNTER — Other Ambulatory Visit: Payer: Self-pay

## 2024-08-19 MED ORDER — FENOFIBRATE 48 MG PO TABS: 48.0000 mg | ORAL_TABLET | Freq: Every day | ORAL | 1 refills | Status: AC

## 2024-10-08 ENCOUNTER — Ambulatory Visit (HOSPITAL_COMMUNITY): Admission: EM | Admit: 2024-10-08 | Discharge: 2024-10-08 | Disposition: A | Discharge status: Home / Self Care

## 2024-10-08 ENCOUNTER — Encounter (HOSPITAL_COMMUNITY): Payer: Self-pay

## 2024-10-08 DIAGNOSIS — L02811 Cutaneous abscess of head [any part, except face]: Secondary | ICD-10-CM | POA: Diagnosis not present

## 2024-10-08 MED ORDER — DOXYCYCLINE HYCLATE 100 MG PO CAPS: 100.0000 mg | ORAL_CAPSULE | Freq: Two times a day (BID) | ORAL | 0 refills | Status: AC

## 2024-10-08 NOTE — ED Triage Notes (Signed)
 Patient reports that he has an abscess to the left posterior head x 4 days.   Patient reports that he has been taking Tylenol  for his pain.

## 2024-10-08 NOTE — ED Provider Notes (Signed)
 [facility]    CSN: [id number] Arrival date & time: 10/08/24  9041      History   Chief Complaint Chief Complaint  Patient presents with   Abscess    HPI Collin [name] is a 89 y.o. male.   Patient with history of skin cancer, presents today due to a tender raised lesion of occiput for the past 4 days.  Patient states the area is irritating and keeps him up at night.  Patient denies purulent drainage from the area.  Patient states that he has a dermatologist that he sees regularly, patient advised to make an appointment to follow-up with them.   Abscess   Past Medical History:  Diagnosis Date   Allergy    Anemia    Arthritis    gout   Bladder cancer (HCC)    BPH (benign prostatic hyperplasia)    CAD (coronary artery disease) 2005   a. diaphragmatic MI s/p DES to RCA 2005, residual disease. b. [place]  11/2019 s/p DES to PDA, mRCA, mid Cx.   Cancer (HCC) 02/28/2001   Left ureter cancer/bladder   Carcinoma, renal cell (HCC)    Cardiomyopathy (HCC)    a. EF 45-50% by echo 12/2018.   Carotid artery disease    Doppler, September, 2010, normal, no carotid artery disease   CKD (chronic kidney disease), stage III (HCC)    COPD (chronic obstructive pulmonary disease) (HCC)    Dysfunction of eustachian tube    Ejection fraction    EF 55%, echo, January, 2009   Encounter for long-term (current) use of other medications    Enlarged prostate    GERD (gastroesophageal reflux disease)    Gynecomastia    b/l   HTN (hypertension)    Hx of cystoscopy 11/02/12   negative   Hx of skin cancer, basal cell    Hypercholesterolemia    Hyperlipidemia    Low back pain syndrome    Myocardial infarction (HCC)    hx acute   Nocturia    Organic impotence    Pulmonary nodule    Chest CT, followed   Reactive airway disease    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   Staph infection 04/1997   Thrombocytopenia     Patient Active Problem List   Diagnosis Date  Noted   Impacted cerumen of both ears 08/09/2024   COVID 03/15/2023   Calf muscle weakness 01/30/2023   Thrombocytopenia    Pulmonary nodule    Nocturia    Myocardial infarction (HCC)    Low back pain syndrome    COPD (chronic obstructive pulmonary disease) (HCC)    BPH (benign prostatic hyperplasia)    History of bladder cancer    Arthritis    CKD (chronic kidney disease), stage III 07/10/2017   Diabetes (HCC) 04/15/2017   Fatty liver 06/18/2016   Rash and nonspecific skin eruption 06/13/2016   H/O bacterial endocarditis 04/06/2016   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   Gynecomastia    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   Dermatochalasis 07/04/2012   Bilateral pseudophakia 07/04/2012   Spinal stenosis, lumbar 05/12/2012   Epiretinal membrane 05/01/2012   Degenerative drusen 05/01/2012   CAD (coronary artery disease)    Carotid artery disease    Fuchs' corneal dystrophy 01/04/2012   Brachial neuritis or radiculitis 12/07/2009   SKIN CANCER, HX OF 12/07/2009   History of renal cell carcinoma 07/18/2009   Hypertension associated with  diabetes (HCC) 04/23/2008   Dyslipidemia associated with type 2 diabetes mellitus (HCC) 01/25/2007   History of cancer, left ureteral 02/28/2001    Past Surgical History:  Procedure Laterality Date   BACK SURGERY  02/1996   bladder tumors  2003, 2004, 01/2009   removed   CARDIAC CATHETERIZATION     CHOLECYSTECTOMY  07/2003   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   sutured, right   CORNEAL TRANSPLANT  10/2008   left   CORNEAL TRANSPLANT  2025   CORONARY STENT INTERVENTION  11/28/2019   CORONARY STENT INTERVENTION N/A 11/28/2019   Procedure: CORONARY STENT INTERVENTION;  Surgeon: Verlin Lonni BIRCH, MD;  Location: [facility];  Service: Cardiovascular;  Laterality: N/A;   LEFT HEART CATH AND CORONARY ANGIOGRAPHY N/A 11/27/2019   Procedure: LEFT HEART CATH AND CORONARY ANGIOGRAPHY;   Surgeon: Verlin Lonni BIRCH, MD;  Location: [facility];  Service: Cardiovascular;  Laterality: N/A;   NEPHRECTOMY  02/2001   left kidney, ureter   osteomyelitis     staph aureus   URETERECTOMY  2003   for cancer       Home Medications    Prior to Admission medications   Medication Sig Start Date End Date Taking? Authorizing Provider  doxycycline (VIBRAMYCIN) 100 MG capsule Take 1 capsule (100 mg total) by mouth 2 (two) times daily. 10/08/24  Yes Andra Krabbe C, PA-C  acetaminophen  (TYLENOL ) 500 MG tablet Take 500 mg by mouth every 6 (six) hours as needed for moderate pain or headache.     [provider]  alfuzosin  (UROXATRAL ) 10 MG 24 hr tablet Take 10 mg by mouth daily.  01/19/12   [provider]  aspirin  EC 81 MG tablet Take 1 tablet (81 mg total) by mouth daily. 11/20/19   Nahser, Aleene PARAS, MD  carvedilol  (COREG ) 3.125 MG tablet TAKE 1 TABLET TWICE A DAY 03/25/24   Nahser, Aleene PARAS, MD  clopidogrel  (PLAVIX ) 75 MG tablet TAKE 1 TABLET DAILY 01/25/24   Nahser, Aleene PARAS, MD  fenofibrate  (TRICOR ) 48 MG tablet Take 1 tablet (48 mg total) by mouth daily. 08/19/24   Wyn Jackee VEAR Mickey., NP  finasteride  (PROSCAR ) 5 MG tablet Take 5 mg by mouth daily.    [provider]  fluticasone  (FLONASE ) 50 MCG/ACT nasal spray Place 1 spray into both nostrils daily. 02/09/24   Dreama, [place]  N, FNP  losartan  (COZAAR ) 50 MG tablet TAKE 1/2 TABLET IN THE     MORNING AND AT BEDTIME 01/25/24   Nahser, Aleene PARAS, MD  Multiple Vitamin (MULTIVITAMIN) tablet Take 1 tablet by mouth daily.    [provider]  nitroGLYCERIN  (NITROSTAT ) 0.4 MG SL tablet Place 1 tablet (0.4 mg total) under the tongue every 5 (five) minutes as needed for chest pain. 01/25/21   Nahser, Aleene PARAS, MD  prednisoLONE acetate (PRED FORTE) 1 % ophthalmic suspension SMARTSIG:1 Drop(s) Left Eye Every 2 Hours    [provider]  rosuvastatin  (CRESTOR ) 5 MG tablet TAKE 1 TABLET AT  BEDTIME 03/25/24   Nahser, Aleene PARAS, MD  triamcinolone  ointment (KENALOG ) 0.1 % Apply 1 application topically daily.     [provider]    Family History Family History  Problem Relation Age of Onset   Heart failure Mother 35   Diabetes Mother    Coronary artery disease Brother    Diabetes Brother    Drug abuse Brother    Diabetes Sister    Diabetes Sister     Social  History Social History   Tobacco Use   Smoking status: Former    Types: Pipe    Quit date: 12/27/1991    Years since quitting: 32.8   Smokeless tobacco: Former   Tobacco comments:    Quit 20-30 years ago as of 2012  Vaping Use   Vaping status: Never Used  Substance Use Topics   Alcohol use: No    Alcohol/week: 0.0 standard drinks of alcohol   Drug use: No     Allergies   Crestor  [rosuvastatin ], Atorvastatin , Peanut oil, and Trandolapril   Review of Systems Review of Systems   Physical Exam Triage Vital Signs ED Triage Vitals  Encounter Vitals Group     BP 10/08/24 1045 113/62     Girls Systolic BP Percentile --      Girls Diastolic BP Percentile --      Boys Systolic BP Percentile --      Boys Diastolic BP Percentile --      Pulse Rate 10/08/24 1045 67     Resp 10/08/24 1045 16     Temp 10/08/24 1045 97.9 F (36.6 C)     Temp Source 10/08/24 1045 Oral     SpO2 10/08/24 1045 92 %     Weight --      Height --      Head Circumference --      Peak Flow --      Pain Score 10/08/24 1044 6     Pain Loc --      Pain Education --      Exclude from Growth Chart --    No data found.  Updated Vital Signs BP 113/62 (BP Location: Left Arm)   Pulse 67   Temp 97.9 F (36.6 C) (Oral)   Resp 16   SpO2 92%   Visual Acuity Right Eye Distance:   Left Eye Distance:   Bilateral Distance:    Right Eye Near:   Left Eye Near:    Bilateral Near:     Physical Exam Vitals and nursing note reviewed.  Constitutional:      General: He is not in acute distress.    Appearance: Normal  appearance. He is not ill-appearing, toxic-appearing or diaphoretic.  Eyes:     General: No scleral icterus. Cardiovascular:     Rate and Rhythm: Normal rate and regular rhythm.     Heart sounds: Normal heart sounds.  Pulmonary:     Effort: Pulmonary effort is normal. No respiratory distress.     Breath sounds: Normal breath sounds. No wheezing or rhonchi.  Skin:    General: Skin is warm.     Findings: Abscess present.         Comments: Tender, raised, erythematous lesion noted over left side of occiput, small scab noted over lesion, no purulent drainage noted  Neurological:     Mental Status: He is alert and oriented to person, place, and time.  Psychiatric:        Mood and Affect: Mood normal.        Behavior: Behavior normal.      UC Treatments / Results  Labs (all labs ordered are listed, but only abnormal results are displayed) Labs Reviewed - No data to display  EKG   Radiology No results found.  Procedures Procedures (including critical care time)  Medications Ordered in UC Medications - No data to display  Initial Impression / Assessment and Plan / UC Course  I have reviewed the triage vital signs  and the nursing notes.  Pertinent labs & imaging results that were available during my care of the patient were reviewed by me and considered in my medical decision making (see chart for details).     Abscess of scalp-patient was prescribed doxycycline 100 mg twice daily for 10 days and advised to follow-up with dermatology. Final Clinical Impressions(s) / UC Diagnoses   Final diagnoses:  Abscess of scalp     Discharge Instructions      Use warm compress to affected area 20 mins at a time a couple times a day. Be sure to take antibiotics in their entirety and follow-up with dermatology.    ED Prescriptions     Medication Sig Dispense Auth. Provider   doxycycline (VIBRAMYCIN) 100 MG capsule Take 1 capsule (100 mg total) by mouth 2 (two) times daily. 20  capsule Andra Corean BROCKS, PA-C      PDMP not reviewed this encounter.   Andra Corean BROCKS, PA-C 10/08/24 1125

## 2024-10-08 NOTE — Discharge Instructions (Signed)
 Use warm compress to affected area 20 mins at a time a couple times a day. Be sure to take antibiotics in their entirety and follow-up with dermatology.

## 2024-10-21 ENCOUNTER — Encounter (INDEPENDENT_AMBULATORY_CARE_PROVIDER_SITE_OTHER): Payer: Self-pay

## 2024-10-21 ENCOUNTER — Other Ambulatory Visit: Payer: Self-pay

## 2024-10-21 ENCOUNTER — Encounter (INDEPENDENT_AMBULATORY_CARE_PROVIDER_SITE_OTHER): Admitting: Audiology

## 2024-10-21 ENCOUNTER — Ambulatory Visit (INDEPENDENT_AMBULATORY_CARE_PROVIDER_SITE_OTHER)

## 2024-10-21 VITALS — BP 119/70 | HR 61 | Temp 97.6°F | Ht 67.0 in | Wt 170.0 lb

## 2024-10-21 DIAGNOSIS — H9313 Tinnitus, bilateral: Secondary | ICD-10-CM

## 2024-10-21 DIAGNOSIS — H903 Sensorineural hearing loss, bilateral: Secondary | ICD-10-CM | POA: Diagnosis not present

## 2024-10-21 DIAGNOSIS — H6123 Impacted cerumen, bilateral: Secondary | ICD-10-CM

## 2024-10-21 MED ORDER — IPRATROPIUM BROMIDE 0.03 % NA SOLN: 1.0000 | Freq: Three times a day (TID) | NASAL | 12 refills | Status: AC | PRN

## 2024-10-21 NOTE — Progress Notes (Unsigned)
  [address] [place], [place] [address] [phone]  Audiological Evaluation    Name: Collin [name]     DOB:   April 19, 1925      MRN:   [id number]                                                                                     Service Date: 10/21/2024     Accompanied by: ***   Patient comes today after {ENT providers:31223} sent a referral for a hearing evaluation due to concerns with {audiology symptoms:31224}.   Symptoms Yes Details  Hearing loss  []    Tinnitus  []    Ear pain/ infections/pressure  []    Balance problems  []    Noise exposure history  []    Previous ear surgeries  []    Family history of hearing loss  []    Amplification  []    Other  []      Otoscopy: Right ear: {otoscopy:31227} Left ear:  {otoscopy:31227}  Tympanometry: Right ear: {tympanometry results:31367} Left ear: {tympanometry results:31367}  Distortion Product Otoacoustic Emissions: Frequencies tested 1.6-8kHz - Diagnostic Test (12 frequencies)   Results are considered present if DP-NF is above 6dB and DP isabove -10dB. Right ear: {DPOAE's:31368::Present from 1600-8000 Hz which is suggestive of normal outer hair cell function.} Left ear: {DPOAE's:31368::Present from 1600-8000 Hz which is suggestive of normal outer hair cell function.}  Hearing Evaluation The hearing test results were completed {transducer options:31388} and results are deemed to be of {test reliability:31390::good reliability}. Test technique:  {audiometric test technique:31400::conventional}    Pure tone Audiometry: Right ear- *** {hearing loss types:31372::sensorineural hearing loss} from *** Hz - *** Hz. Left ear-  *** {hearing loss types:31372::sensorineural hearing loss} from *** Hz - *** Hz.  Speech Audiometry: Right ear- {AUD SRT/SAT:19348}. Left ear-{AUD SRT/SAT:19348}.   Word Recognition Score Tested using {word lists:31376::NU-6 (recorded)} Right ear: ***% was obtained at a  presentation level of *** dBHL with contralateral masking which is deemed as  {word recognition score:31373}. Left ear: ***% was obtained at a presentation level of *** dBHL with contralateral masking which is deemed as  {word recognition score:31373}.   Impression: {Word recognition Score interpretation:31432::There is not a significant difference in pure-tone thresholds between ears.,There is not a significant difference in the word recognition score in between ears. }   Recommendations: {Audiology Recommendations:31370::Follow up with ENT as scheduled for today.}   Gelisa Tieken MARIE LEROUX-MARTINEZ, AUD

## 2024-10-21 NOTE — Progress Notes (Signed)
 Dear Dr. Geofm, Here is my assessment for our mutual patient, Collin [name]. Thank you for allowing me the opportunity to care for your patient. Please do not hesitate to contact me should you have any other questions. Sincerely, Dr. Penne Croak  Otolaryngology Clinic Note Referring provider: Dr. Geofm HPI:  Discussed the use of AI scribe software for clinical note transcription with the patient, who gave verbal consent to proceed.  History of Present Illness Collin [name] is a 89 year old male who presents with excessive ear wax buildup and hearing difficulties.  Cerumen impaction and hearing loss - Frequent buildup of ear wax, attributed to use of hearing aids - Cerumen impaction causes decreased hearing acuity - Previous ear cleaning procedures have provided symptomatic relief  Hearing aid fit and function - Uses two different sizes of hearing aids; concerned about fit - Smaller hearing aid may not insert deeply enough, while larger size may be too deep - Experiences increased background noise with hearing aids, making it difficult to hear conversations  Allergic rhinitis and nasal symptoms - Frequent hay fever and sneezing - Continuous rhinorrhea unless treated with medication - Uses nasal sprays and allergy pills for symptom control, but unable to recall specific medication names - Occasionally uses Tylenol  or an allergy pill for additional relief - No recent nasal trauma  Independent Review of Additional Tests or Records:  Reviewed external note from referring PCP, Burns,describing RElevant history incorporated into today's evaluation.   Outside audiogram from 08/17/2023 personally reviewed and interpreted - right ear with a moderate to severe sensorineural hearing loss and left ear with a mild to severe sensorineural hearing loss. Word recognition 60% on the right and 88% on the left  PMH/Meds/All/SocHx/FamHx/ROS:   Past Medical History:  Diagnosis Date   Allergy     Anemia    Arthritis    gout   Bladder cancer (HCC)    BPH (benign prostatic hyperplasia)    CAD (coronary artery disease) 2005   a. diaphragmatic MI s/p DES to RCA 2005, residual disease. b. [place]  11/2019 s/p DES to PDA, mRCA, mid Cx.   Cancer (HCC) 02/28/2001   Left ureter cancer/bladder   Carcinoma, renal cell (HCC)    Cardiomyopathy (HCC)    a. EF 45-50% by echo 12/2018.   Carotid artery disease    Doppler, September, 2010, normal, no carotid artery disease   CKD (chronic kidney disease), stage III (HCC)    COPD (chronic obstructive pulmonary disease) (HCC)    Dysfunction of eustachian tube    Ejection fraction    EF 55%, echo, January, 2009   Encounter for long-term (current) use of other medications    Enlarged prostate    GERD (gastroesophageal reflux disease)    Gynecomastia    b/l   HTN (hypertension)    Hx of cystoscopy 11/02/12   negative   Hx of skin cancer, basal cell    Hypercholesterolemia    Hyperlipidemia    Low back pain syndrome    Myocardial infarction (HCC)    hx acute   Nocturia    Organic impotence    Pulmonary nodule    Chest CT, followed   Reactive airway disease    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   Staph infection 04/1997   Thrombocytopenia      Past Surgical History:  Procedure Laterality Date   BACK SURGERY  02/1996   bladder tumors  2003, 2004, 01/2009   removed  CARDIAC CATHETERIZATION     CHOLECYSTECTOMY  07/2003   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   sutured, right   CORNEAL TRANSPLANT  10/2008   left   CORNEAL TRANSPLANT  2025   CORONARY STENT INTERVENTION  11/28/2019   CORONARY STENT INTERVENTION N/A 11/28/2019   Procedure: CORONARY STENT INTERVENTION;  Surgeon: Verlin Lonni BIRCH, MD;  Location: [facility];  Service: Cardiovascular;  Laterality: N/A;   LEFT HEART CATH AND CORONARY ANGIOGRAPHY N/A 11/27/2019   Procedure: LEFT HEART CATH AND CORONARY ANGIOGRAPHY;  Surgeon: Verlin Lonni BIRCH, MD;   Location: [facility];  Service: Cardiovascular;  Laterality: N/A;   NEPHRECTOMY  02/2001   left kidney, ureter   osteomyelitis     staph aureus   URETERECTOMY  2003   for cancer    Family History  Problem Relation Age of Onset   Heart failure Mother 84   Diabetes Mother    Coronary artery disease Brother    Diabetes Brother    Drug abuse Brother    Diabetes Sister    Diabetes Sister      Social Connections: Moderately Integrated (08/09/2024)   Social Connection and Isolation Panel    Frequency of Communication with Friends and Family: Three times a week    Frequency of Social Gatherings with Friends and Family: Twice a week    Attends Religious Services: More than 4 times per year    Active Member of [employer] or Organizations: Yes    Attends Banker Meetings: More than 4 times per year    Marital Status: Widowed      Current Outpatient Medications:    ipratropium (ATROVENT) 0.03 % nasal spray, Place 1 spray into both nostrils 3 (three) times daily as needed for rhinitis., Disp: 30 mL, Rfl: 12   acetaminophen  (TYLENOL ) 500 MG tablet, Take 500 mg by mouth every 6 (six) hours as needed for moderate pain or headache. , Disp: , Rfl:    alfuzosin  (UROXATRAL ) 10 MG 24 hr tablet, Take 10 mg by mouth daily. , Disp: , Rfl:    aspirin  EC 81 MG tablet, Take 1 tablet (81 mg total) by mouth daily., Disp: , Rfl:    carvedilol  (COREG ) 3.125 MG tablet, TAKE 1 TABLET TWICE A DAY, Disp: 180 tablet, Rfl: 3   clopidogrel  (PLAVIX ) 75 MG tablet, TAKE 1 TABLET DAILY, Disp: 90 tablet, Rfl: 2   doxycycline (VIBRAMYCIN) 100 MG capsule, Take 1 capsule (100 mg total) by mouth 2 (two) times daily., Disp: 20 capsule, Rfl: 0   fenofibrate  (TRICOR ) 48 MG tablet, Take 1 tablet (48 mg total) by mouth daily., Disp: 90 tablet, Rfl: 1   finasteride  (PROSCAR ) 5 MG tablet, Take 5 mg by mouth daily., Disp: , Rfl:    fluticasone  (FLONASE ) 50 MCG/ACT nasal spray, Place 1 spray into both nostrils  daily., Disp: 9.9 mL, Rfl: 2   losartan  (COZAAR ) 50 MG tablet, TAKE 1/2 TABLET IN THE     MORNING AND AT BEDTIME, Disp: 90 tablet, Rfl: 2   Multiple Vitamin (MULTIVITAMIN) tablet, Take 1 tablet by mouth daily., Disp: , Rfl:    nitroGLYCERIN  (NITROSTAT ) 0.4 MG SL tablet, Place 1 tablet (0.4 mg total) under the tongue every 5 (five) minutes as needed for chest pain., Disp: 25 tablet, Rfl: 6   prednisoLONE acetate (PRED FORTE) 1 % ophthalmic suspension, SMARTSIG:1 Drop(s) Left Eye Every 2 Hours, Disp: , Rfl:    rosuvastatin  (CRESTOR ) 5 MG tablet, TAKE 1 TABLET AT BEDTIME,  Disp: 90 tablet, Rfl: 3   triamcinolone  ointment (KENALOG ) 0.1 %, Apply 1 application topically daily. , Disp: , Rfl:    Physical Exam:   BP 119/70   Pulse 61   Temp 97.6 F (36.4 C)   Ht 5' 7 (1.702 m)   Wt 170 lb (77.1 kg)   SpO2 94%   BMI 26.63 kg/m   The patient was awake, alert, and appropriate. The external ears were inspected, and otoscopy was performed to evaluate the external auditory canals and tympanic membranes. The nasal cavity and septum were examined for mucosal changes, obstruction, or discharge. The oral cavity and oropharynx were inspected for mucosal lesions, infection, or tonsillar hypertrophy. The neck was palpated for lymphadenopathy, thyroid  abnormalities, or other masses. Cranial nerve function was grossly intact.  Pertinent Findings: Physical Exam HEENT: Cerumen impaction in both EAC with eardrum visible after cleaning. Nasal septum deviated. Oral cavity normal.   Seprately Identifiable Procedures:  I personally ordered, reviewed and interpreted the following with the patient today  Procedure: Bilateral ear microscopy and cerumen removal using microscope (CPT [id number]) - Mod 25 Pre-procedure diagnosis: Cerumen impaction external ear/ears Post-procedure diagnosis: same Indication:  cerumen impaction; given patient's otologic complaints and history as well as for improved and comprehensive  examination of external ear and tympanic membrane, bilateral otologic examination using microscope was performed and impacted cerumen removed  Procedure: Patient was placed semi-recumbent. Both ear canals were examined using the microscope with findings above. Cerumen removed on left and on right using suction and currette with improvement in EAC examination and patency. Left: EAC was patent. TM was intact . Middle ear was aerated. Drainage: absent Right: EAC was patent. TM was intact . Middle ear was aerated . Drainage: absent Patient tolerated the procedure well.  Impression & Plans:  Collin [name] is a 89 y.o. male  1. Impacted cerumen of both ears   2. Sensorineural hearing loss (SNHL) of both ears   3. Tinnitus of both ears     - Findings and diagnoses discussed in detail with the patient. - Risks, benefits, and alternatives were reviewed. Through shared decision making, the patient elects to proceed with below. Assessment & Plan Bilateral impacted cerumen Impacted cerumen, more severe on the right, worsened by hearing aids. - Clean ears every 4 to 6 months. - Schedule a hearing [facility] at [facility].  Hearing loss possibly worsened by cerumen impaction. Hearing aids increase noise without improving speech clarity. - Schedule a hearing test at [facility]. - Bring hearing test results to hearing aid provider for adjustment.  Allergic rhinitis Chronic allergic rhinitis with nasal congestion and rhinorrhea. Septum deviated, no intervention planned. - Continue nasal spray and oral antihistamines as needed.  - Orders placed: No orders of the defined types were placed in this encounter.  - Medications prescribed/continued/adjusted:  Meds ordered this encounter  Medications   ipratropium (ATROVENT) 0.03 % nasal spray    Sig: Place 1 spray into both nostrils 3 (three) times daily as needed for rhinitis.    Dispense:  30 mL    Refill:  12   - Education materials provided to the  patient. - Follow up: after hearing test or in 4-6 months for cerumen removal. Patient instructed to return sooner or go to the ED if new/worsening symptoms develop.   Thank you for allowing me the opportunity to care for your patient. Please do not hesitate to contact me should you have any other questions.  Sincerely, Penne Croak, DO Otolaryngologist (  ENT) [facility] Phone: [phone] Fax: [phone]  10/21/2024, 9:18 PM

## 2024-10-22 MED ORDER — LOSARTAN POTASSIUM 50 MG PO TABS: ORAL_TABLET | ORAL | 0 refills | Status: DC

## 2024-10-22 MED ORDER — CLOPIDOGREL BISULFATE 75 MG PO TABS: 75.0000 mg | ORAL_TABLET | Freq: Every day | ORAL | 0 refills | Status: DC

## 2024-11-11 ENCOUNTER — Encounter: Payer: Self-pay | Admitting: Internal Medicine

## 2024-12-11 ENCOUNTER — Encounter: Payer: Self-pay | Admitting: Internal Medicine

## 2024-12-12 ENCOUNTER — Ambulatory Visit: Admitting: Internal Medicine

## 2024-12-12 ENCOUNTER — Encounter: Payer: Self-pay | Admitting: Internal Medicine

## 2024-12-12 VITALS — BP 120/72 | HR 60 | Temp 97.9°F | Ht 67.0 in | Wt 171.0 lb

## 2024-12-12 DIAGNOSIS — J439 Emphysema, unspecified: Secondary | ICD-10-CM

## 2024-12-12 DIAGNOSIS — N1831 Chronic kidney disease, stage 3a: Secondary | ICD-10-CM

## 2024-12-12 DIAGNOSIS — E1159 Type 2 diabetes mellitus with other circulatory complications: Secondary | ICD-10-CM

## 2024-12-12 DIAGNOSIS — J3489 Other specified disorders of nose and nasal sinuses: Secondary | ICD-10-CM

## 2024-12-12 DIAGNOSIS — I152 Hypertension secondary to endocrine disorders: Secondary | ICD-10-CM | POA: Diagnosis not present

## 2024-12-12 DIAGNOSIS — E1122 Type 2 diabetes mellitus with diabetic chronic kidney disease: Secondary | ICD-10-CM

## 2024-12-12 NOTE — Assessment & Plan Note (Signed)
 BP Readings from Last 3 Encounters:  12/12/24 120/72  10/21/24 119/70  10/08/24 113/62   Stable, pt to continue medical treatment coreg  3.125 mg bid, losartan  50 mg every day,

## 2024-12-12 NOTE — Assessment & Plan Note (Signed)
 Recent onset multiple, for mupirocin  ointment intranasal bid x 5 days

## 2024-12-12 NOTE — Assessment & Plan Note (Signed)
Stable overall, cont inhaler asd

## 2024-12-12 NOTE — Patient Instructions (Signed)
 Please take all new medication as prescribed - the nasal ointment antibiotic  Please continue all other medications as before, and refills have been done if requested.  Please have the pharmacy call with any other refills you may need.  Please keep your appointments with your specialists as you may have planned

## 2024-12-12 NOTE — Progress Notes (Signed)
 Patient ID: Collin [name], male   DOB: 1925/07/15, 89 y.o.   MRN: [id number]        Chief Complaint: follow up nasal sores       HPI:  Collin [name] is a 89 y.o. male here with c/o 1 wk onset nasal sores multiple annoying getting worse, without nasal or sinus congestion, drainage or bleeding or trauma.  No ST, ear pain, cough and Pt denies chest pain, increased sob or doe, wheezing, orthopnea, PND, increased LE swelling, palpitations, dizziness or syncope.        Wt Readings from Last 3 Encounters:  12/12/24 171 lb (77.6 kg)  10/21/24 170 lb (77.1 kg)  08/09/24 167 lb (75.8 kg)   BP Readings from Last 3 Encounters:  12/12/24 120/72  10/21/24 119/70  10/08/24 113/62         Past Medical History:  Diagnosis Date   Allergy    Anemia    Arthritis    gout   Bladder cancer (HCC)    BPH (benign prostatic hyperplasia)    CAD (coronary artery disease) 2005   a. diaphragmatic MI s/p DES to RCA 2005, residual disease. b. [place]  11/2019 s/p DES to PDA, mRCA, mid Cx.   Cancer (HCC) 02/28/2001   Left ureter cancer/bladder   Carcinoma, renal cell (HCC)    Cardiomyopathy (HCC)    a. EF 45-50% by echo 12/2018.   Carotid artery disease    Doppler, September, 2010, normal, no carotid artery disease   CKD (chronic kidney disease), stage III (HCC)    COPD (chronic obstructive pulmonary disease) (HCC)    Dysfunction of eustachian tube    Ejection fraction    EF 55%, echo, January, 2009   Encounter for long-term (current) use of other medications    Enlarged prostate    GERD (gastroesophageal reflux disease)    Gynecomastia    b/l   HTN (hypertension)    Hx of cystoscopy 11/02/12   negative   Hx of skin cancer, basal cell    Hypercholesterolemia    Hyperlipidemia    Low back pain syndrome    Myocardial infarction (HCC)    hx acute   Nocturia    Organic impotence    Pulmonary nodule    Chest CT, followed   Reactive airway disease    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    Staph infection 04/1997   Thrombocytopenia    Past Surgical History:  Procedure Laterality Date   BACK SURGERY  02/1996   bladder tumors  2003, 2004, 01/2009   removed   CARDIAC CATHETERIZATION     CHOLECYSTECTOMY  07/2003   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   sutured, right   CORNEAL TRANSPLANT  10/2008   left   CORNEAL TRANSPLANT  2025   CORONARY STENT INTERVENTION  11/28/2019   CORONARY STENT INTERVENTION N/A 11/28/2019   Procedure: CORONARY STENT INTERVENTION;  Surgeon: Verlin Lonni BIRCH, MD;  Location: [facility];  Service: Cardiovascular;  Laterality: N/A;   LEFT HEART CATH AND CORONARY ANGIOGRAPHY N/A 11/27/2019   Procedure: LEFT HEART CATH AND CORONARY ANGIOGRAPHY;  Surgeon: Verlin Lonni BIRCH, MD;  Location: [facility];  Service: Cardiovascular;  Laterality: N/A;   NEPHRECTOMY  02/2001   left kidney, ureter   osteomyelitis     staph aureus   URETERECTOMY  2003   for cancer    reports that he quit smoking about 32 years ago. His smoking use  included pipe. He has quit using smokeless tobacco. He reports that he does not drink alcohol and does not use drugs. family history includes Coronary artery disease in his brother; Diabetes in his brother, mother, sister, and sister; Drug abuse in his brother; Heart failure (age of onset: 25) in his mother. Allergies[1] Medications Ordered Prior to Encounter[2]      ROS:  All others reviewed and negative.  Objective        PE:  BP 120/72 (BP Location: Left Arm, Patient Position: Sitting, Cuff Size: Normal)   Pulse 60   Temp 97.9 F (36.6 C) (Oral)   Ht 5' 7 (1.702 m)   Wt 171 lb (77.6 kg)   SpO2 98%   BMI 26.78 kg/m                 Constitutional: Pt appears in NAD               HENT: Head: NCAT.                Right Ear: External ear normal.                 Left Ear: External ear normal.                Eyes: . Pupils are equal, round, and reactive to light. Conjunctivae and EOM are normal                Nose: without d/c or deformity               Neck: Neck supple. Gross normal ROM               Cardiovascular: Normal rate and regular rhythm.                 Pulmonary/Chest: Effort normal and breath sounds without rales or wheezing.                               Neurological: Pt is alert. At baseline orientation, motor grossly intact               Skin: Skin is warm. No rashes, no other new lesions, LE edema - none               Psychiatric: Pt behavior is normal without agitation   Micro: none  Cardiac tracings I have personally interpreted today:  none  Pertinent Radiological findings (summarize): none   Lab Results  Component Value Date   WBC 3.7 (L) 08/09/2024   HGB 12.5 (L) 08/09/2024   HCT 37.7 (L) 08/09/2024   PLT 106.0 (L) 08/09/2024   GLUCOSE 107 (H) 08/09/2024   CHOL 128 08/09/2024   TRIG 153.0 (H) 08/09/2024   HDL 42.30 08/09/2024   LDLDIRECT 58.0 08/01/2023   LDLCALC 55 08/09/2024   ALT 13 08/09/2024   AST 18 08/09/2024   NA 140 08/09/2024   K 5.0 08/09/2024   CL 103 08/09/2024   CREATININE 1.87 (H) 08/09/2024   BUN 31 (H) 08/09/2024   CO2 29 08/09/2024   TSH 3.40 07/08/2019   INR 1.04 11/04/2009   HGBA1C 6.7 (H) 08/09/2024   Assessment/Plan:  Collin [name] is a 89 y.o. White or Caucasian [1] male with  has a past medical history of Allergy, Anemia, Arthritis, Bladder cancer (HCC), BPH (benign prostatic hyperplasia), CAD (coronary artery disease) (2005), Cancer (HCC) (02/28/2001), Carcinoma, renal cell (HCC),  Cardiomyopathy (HCC), Carotid artery disease, CKD (chronic kidney disease), stage III (HCC), COPD (chronic obstructive pulmonary disease) (HCC), Dysfunction of eustachian tube, Ejection fraction, Encounter for long-term (current) use of other medications, Enlarged prostate, GERD (gastroesophageal reflux disease), Gynecomastia, HTN (hypertension), cystoscopy (11/02/12), skin cancer, basal cell, Hypercholesterolemia, Hyperlipidemia, Low back pain syndrome,  Myocardial infarction [facility]), Nocturia, Organic impotence, Pulmonary nodule, Reactive airway disease, Skin cancer, Staph infection (04/1997), and Thrombocytopenia.  Nasal sore Recent onset multiple, for mupirocin  ointment intranasal bid x 5 days  COPD (chronic obstructive pulmonary disease) (HCC) Stable overall, cont inhaler asd  Diabetes (HCC) Without insulin, with ckd3a  Lab Results  Component Value Date   HGBA1C 6.7 (H) 08/09/2024   Stable, pt to continue diet control   Hypertension associated with diabetes (HCC) BP Readings from Last 3 Encounters:  12/12/24 120/72  10/21/24 119/70  10/08/24 113/62   Stable, pt to continue medical treatment coreg  3.125 mg bid, losartan  50 mg every day,  Followup: Return if symptoms worsen or fail to improve.  Lynwood Rush, MD 12/12/2024 8:37 PM [facility]     [1]  Allergies Allergen Reactions   Atorvastatin      Muscle aches    Peanut Oil Rash    sneezing   Trandolapril Cough  [2]  Current Outpatient Medications on File Prior to Visit  Medication Sig Dispense Refill   acetaminophen  (TYLENOL ) 500 MG tablet Take 500 mg by mouth every 6 (six) hours as needed for moderate pain or headache.      alfuzosin  (UROXATRAL ) 10 MG 24 hr tablet Take 10 mg by mouth daily.      aspirin  EC 81 MG tablet Take 1 tablet (81 mg total) by mouth daily.     carvedilol  (COREG ) 3.125 MG tablet TAKE 1 TABLET TWICE A DAY 180 tablet 3   clopidogrel  (PLAVIX ) 75 MG tablet Take 1 tablet (75 mg total) by mouth daily. 90 tablet 0   doxycycline  (VIBRAMYCIN ) 100 MG capsule Take 1 capsule (100 mg total) by mouth 2 (two) times daily. 20 capsule 0   fenofibrate  (TRICOR ) 48 MG tablet Take 1 tablet (48 mg total) by mouth daily. 90 tablet 1   finasteride  (PROSCAR ) 5 MG tablet Take 5 mg by mouth daily.     fluticasone  (FLONASE ) 50 MCG/ACT nasal spray Place 1 spray into both nostrils daily. 9.9 mL 2    ipratropium (ATROVENT ) 0.03 % nasal spray Place 1 spray into both nostrils 3 (three) times daily as needed for rhinitis. 30 mL 12   losartan  (COZAAR ) 50 MG tablet Take 1/2 tablet by mouth daily in the morning and at bedtime 90 tablet 0   Multiple Vitamin (MULTIVITAMIN) tablet Take 1 tablet by mouth daily.     nitroGLYCERIN  (NITROSTAT ) 0.4 MG SL tablet Place 1 tablet (0.4 mg total) under the tongue every 5 (five) minutes as needed for chest pain. 25 tablet 6   prednisoLONE acetate (PRED FORTE) 1 % ophthalmic suspension SMARTSIG:1 Drop(s) Left Eye Every 2 Hours     rosuvastatin  (CRESTOR ) 5 MG tablet TAKE 1 TABLET AT BEDTIME 90 tablet 3   triamcinolone  ointment (KENALOG ) 0.1 % Apply 1 application topically daily.      No current facility-administered medications on file prior to visit.

## 2024-12-12 NOTE — Assessment & Plan Note (Signed)
 Without insulin, with ckd3a  Lab Results  Component Value Date   HGBA1C 6.7 (H) 08/09/2024   Stable, pt to continue diet control

## 2025-01-18 ENCOUNTER — Other Ambulatory Visit: Payer: Self-pay | Admitting: Physician Assistant

## 2025-03-17 ENCOUNTER — Ambulatory Visit: Admitting: Cardiovascular Disease

## 2025-04-21 ENCOUNTER — Ambulatory Visit (INDEPENDENT_AMBULATORY_CARE_PROVIDER_SITE_OTHER)

## 2025-07-18 ENCOUNTER — Ambulatory Visit
